# Patient Record
Sex: Female | Born: 1965 | Race: White | Hispanic: No | Smoking: Former smoker
Health system: Southern US, Community
[De-identification: ages and names within clinical notes are randomized; demographics above are authoritative.]

## PROBLEM LIST (undated history)

## (undated) DIAGNOSIS — F419 Anxiety disorder, unspecified: Secondary | ICD-10-CM

## (undated) DIAGNOSIS — K7460 Unspecified cirrhosis of liver: Secondary | ICD-10-CM

## (undated) DIAGNOSIS — F1010 Alcohol abuse, uncomplicated: Secondary | ICD-10-CM

---

## 2018-05-20 ENCOUNTER — Emergency Department
Admission: EM | Admit: 2018-05-20 | Discharge: 2018-05-21 | Disposition: A | Discharge status: Home / Self Care | Payer: BLUE CROSS/BLUE SHIELD | Attending: Emergency Medicine | Admitting: Emergency Medicine

## 2018-05-20 DIAGNOSIS — R51 Headache: Secondary | ICD-10-CM | POA: Diagnosis present

## 2018-05-20 DIAGNOSIS — Z87891 Personal history of nicotine dependence: Secondary | ICD-10-CM | POA: Diagnosis not present

## 2018-05-20 DIAGNOSIS — R519 Headache, unspecified: Secondary | ICD-10-CM

## 2018-05-20 HISTORY — DX: Unspecified cirrhosis of liver: K74.60

## 2018-05-20 LAB — COOXEMETRY PANEL
Carboxyhemoglobin: 1.6 % — ABNORMAL HIGH (ref 0.5–1.5)
Methemoglobin: 0.9 % (ref 0.0–1.5)
O2 Saturation: 93.2 %
Total hemoglobin: 10.8 g/dL — ABNORMAL LOW (ref 12.0–16.0)

## 2018-05-20 NOTE — ED Triage Notes (Signed)
Patient coming [facility] from home. Patient c/o headache N/V X 5 days. Patient just moved into new home 5 days ago.   EMS vitals: 170/90, 96% RA, HR 102.

## 2018-05-21 NOTE — ED Notes (Signed)
Patient placed in subwait on a nonrebreather per MD Owens Shark.

## 2018-05-21 NOTE — ED Provider Notes (Signed)
[facility] Emergency Department Provider Note   ____________________________________________    I have reviewed the triage vital signs and the nursing notes.   HISTORY  Chief Complaint Headache     HPI Lisa Dennis is a 53 y.o. female who presents with complaints of headaches.  Patient reports she recently moved into a "historic district home" 5 days ago and since then she has had intermittent global throbbing headaches.  She suspects this is related to her new home.  Denies light sensitivity.  Does have a history of headaches.  No neck pain.  No neuro deficits.  Has not taken anything for this, does not believe in taking medications.  Currently feels well and has no complaints  Past Medical History:  Diagnosis Date  . Cirrhosis ([place])     There are no active problems to display for this patient.   History reviewed. No pertinent surgical history.  Prior to Admission medications   Not on File     Allergies Benadryl [diphenhydramine]; Morphine and related; Penicillins; Sulfa antibiotics; and Tetracyclines & related  No family history on file.  Social History Social History   Tobacco Use  . Smoking status: Former Research scientist (life sciences)  . Smokeless tobacco: Never Used  Substance Use Topics  . Alcohol use: Yes  . Drug use: Not on file    Review of Systems  Constitutional: No fever/chills Eyes: No visual changes.  ENT: No neck pain Cardiovascular: Denies chest pain. Respiratory: Denies shortness of breath. Gastrointestinal: No abdominal pain.  No nausea, no vomiting.   Genitourinary: Negative for dysuria. Musculoskeletal: Negative for back pain. Skin: Negative for rash. Neurological: As above   ____________________________________________   PHYSICAL EXAM:  VITAL SIGNS: ED Triage Vitals  Enc Vitals Group     BP 05/20/18 2247 (!) 137/91     Pulse Rate 05/20/18 2247 (!) 104     Resp 05/20/18 2247 18     Temp 05/20/18 2247 98.1  F (36.7 C)     Temp Source 05/20/18 2247 Oral     SpO2 05/20/18 2247 96 %     Weight 05/20/18 2240 53.5 kg (118 lb)     Height 05/20/18 2240 1.549 m (5\' 1" )     Head Circumference --      Peak Flow --      Pain Score 05/20/18 2240 7     Pain Loc --      Pain Edu? --      Excl. in [place]? --     Constitutional: Alert and oriented.  Eyes: Conjunctivae are normal.  PERRLA, EOMI Head: Atraumatic. Nose: No congestion/rhinnorhea. Mouth/Throat: Mucous membranes are moist.   Neck:  Painless ROM Cardiovascular: Normal rate, regular rhythm. Good peripheral circulation. Respiratory: Normal respiratory effort.  No retractions. Lungs CTAB.  Musculoskeletal: Warm and well perfused Neurologic:  Normal speech and language. No gross focal neurologic deficits are appreciated.  Skin:  Skin is warm, dry and intact. No rash noted. Psychiatric: Mood and affect are normal. Speech and behavior are normal.  ____________________________________________   LABS (all labs ordered are listed, but only abnormal results are displayed)  Labs Reviewed  COOXEMETRY PANEL - Abnormal; Notable for the following components:      Result Value   Total hemoglobin 10.8 (*)    Carboxyhemoglobin 1.6 (*)    All other components within normal limits   ____________________________________________  EKG  None ____________________________________________  RADIOLOGY  None ____________________________________________   PROCEDURES  Procedure(s) performed: No  Procedures   Critical Care performed: No ____________________________________________   INITIAL IMPRESSION / ASSESSMENT AND PLAN / ED COURSE  Pertinent labs & imaging results that were available during my care of the patient were reviewed by me and considered in my medical decision making (see chart for details).  Patient well-appearing in no acute distress, neuro exam is normal.  Unclear whether environmental exposure may be causing her headaches  although currently she is completely asymptomatic and feels well.  Recommended trialing ibuprofen or naproxen but she does not take medications.  Will refer her to internal medicine as she is new to the area.    ____________________________________________   FINAL CLINICAL IMPRESSION(S) / ED DIAGNOSES  Final diagnoses:  Acute nonintractable headache, unspecified headache type        Note:  This document was prepared using Dragon voice recognition software and may include unintentional dictation errors.   Lavonia Drafts, MD 05/21/18 [phone]

## 2018-06-08 ENCOUNTER — Other Ambulatory Visit: Payer: Self-pay

## 2018-06-08 ENCOUNTER — Inpatient Hospital Stay: Payer: BLUE CROSS/BLUE SHIELD | Admitting: Certified Registered"

## 2018-06-08 ENCOUNTER — Inpatient Hospital Stay
Admission: EM | Admit: 2018-06-08 | Discharge: 2018-06-09 | DRG: 378 | Disposition: A | Discharge status: Home / Self Care | Payer: BLUE CROSS/BLUE SHIELD | Attending: Internal Medicine | Admitting: Internal Medicine

## 2018-06-08 ENCOUNTER — Encounter: Payer: Self-pay | Admitting: Emergency Medicine

## 2018-06-08 ENCOUNTER — Emergency Department: Payer: BLUE CROSS/BLUE SHIELD

## 2018-06-08 ENCOUNTER — Encounter
Admission: EM | Disposition: A | Discharge status: Home / Self Care | Payer: Self-pay | Source: Home / Self Care | Attending: Internal Medicine

## 2018-06-08 DIAGNOSIS — Z7141 Alcohol abuse counseling and surveillance of alcoholic: Secondary | ICD-10-CM | POA: Diagnosis not present

## 2018-06-08 DIAGNOSIS — Z880 Allergy status to penicillin: Secondary | ICD-10-CM | POA: Diagnosis not present

## 2018-06-08 DIAGNOSIS — D62 Acute posthemorrhagic anemia: Secondary | ICD-10-CM | POA: Diagnosis present

## 2018-06-08 DIAGNOSIS — Z885 Allergy status to narcotic agent status: Secondary | ICD-10-CM

## 2018-06-08 DIAGNOSIS — Z87891 Personal history of nicotine dependence: Secondary | ICD-10-CM | POA: Diagnosis not present

## 2018-06-08 DIAGNOSIS — Y906 Blood alcohol level of 120-199 mg/100 ml: Secondary | ICD-10-CM | POA: Diagnosis present

## 2018-06-08 DIAGNOSIS — K922 Gastrointestinal hemorrhage, unspecified: Secondary | ICD-10-CM | POA: Diagnosis present

## 2018-06-08 DIAGNOSIS — F10239 Alcohol dependence with withdrawal, unspecified: Secondary | ICD-10-CM | POA: Diagnosis present

## 2018-06-08 DIAGNOSIS — I341 Nonrheumatic mitral (valve) prolapse: Secondary | ICD-10-CM | POA: Diagnosis present

## 2018-06-08 DIAGNOSIS — Z882 Allergy status to sulfonamides status: Secondary | ICD-10-CM

## 2018-06-08 DIAGNOSIS — I8500 Esophageal varices without bleeding: Secondary | ICD-10-CM | POA: Diagnosis present

## 2018-06-08 DIAGNOSIS — K449 Diaphragmatic hernia without obstruction or gangrene: Secondary | ICD-10-CM | POA: Diagnosis present

## 2018-06-08 DIAGNOSIS — D689 Coagulation defect, unspecified: Secondary | ICD-10-CM

## 2018-06-08 DIAGNOSIS — K3189 Other diseases of stomach and duodenum: Secondary | ICD-10-CM | POA: Diagnosis present

## 2018-06-08 DIAGNOSIS — D649 Anemia, unspecified: Secondary | ICD-10-CM

## 2018-06-08 DIAGNOSIS — F419 Anxiety disorder, unspecified: Secondary | ICD-10-CM | POA: Diagnosis present

## 2018-06-08 DIAGNOSIS — K7030 Alcoholic cirrhosis of liver without ascites: Secondary | ICD-10-CM

## 2018-06-08 DIAGNOSIS — K920 Hematemesis: Secondary | ICD-10-CM | POA: Diagnosis present

## 2018-06-08 DIAGNOSIS — K7460 Unspecified cirrhosis of liver: Secondary | ICD-10-CM

## 2018-06-08 DIAGNOSIS — Z881 Allergy status to other antibiotic agents status: Secondary | ICD-10-CM | POA: Diagnosis not present

## 2018-06-08 DIAGNOSIS — R791 Abnormal coagulation profile: Secondary | ICD-10-CM | POA: Diagnosis present

## 2018-06-08 DIAGNOSIS — E876 Hypokalemia: Secondary | ICD-10-CM | POA: Diagnosis present

## 2018-06-08 DIAGNOSIS — K766 Portal hypertension: Secondary | ICD-10-CM | POA: Diagnosis present

## 2018-06-08 DIAGNOSIS — R197 Diarrhea, unspecified: Secondary | ICD-10-CM

## 2018-06-08 DIAGNOSIS — F10939 Alcohol use, unspecified with withdrawal, unspecified: Secondary | ICD-10-CM

## 2018-06-08 DIAGNOSIS — D696 Thrombocytopenia, unspecified: Secondary | ICD-10-CM | POA: Diagnosis present

## 2018-06-08 HISTORY — DX: Anxiety disorder, unspecified: F41.9

## 2018-06-08 HISTORY — PX: ESOPHAGOGASTRODUODENOSCOPY (EGD) WITH PROPOFOL: SHX5813

## 2018-06-08 HISTORY — DX: Alcohol abuse, uncomplicated: F10.10

## 2018-06-08 LAB — DIFFERENTIAL
Abs Immature Granulocytes: 0.03 10*3/uL (ref 0.00–0.07)
Basophils Absolute: 0.1 10*3/uL (ref 0.0–0.1)
Basophils Relative: 2 %
Eosinophils Absolute: 0 10*3/uL (ref 0.0–0.5)
Eosinophils Relative: 1 %
Immature Granulocytes: 1 %
Lymphocytes Relative: 20 %
Lymphs Abs: 0.9 10*3/uL (ref 0.7–4.0)
Monocytes Absolute: 0.6 10*3/uL (ref 0.1–1.0)
Monocytes Relative: 15 %
NEUTROS ABS: 2.7 10*3/uL (ref 1.7–7.7)
Neutrophils Relative %: 61 %
RBC Morphology: NONE SEEN
Smear Review: NORMAL

## 2018-06-08 LAB — URINALYSIS, COMPLETE (UACMP) WITH MICROSCOPIC
BACTERIA UA: NONE SEEN
Bilirubin Urine: NEGATIVE
Glucose, UA: NEGATIVE mg/dL
Hgb urine dipstick: NEGATIVE
Ketones, ur: NEGATIVE mg/dL
Leukocytes,Ua: NEGATIVE
Nitrite: NEGATIVE
Protein, ur: 30 mg/dL — AB
Specific Gravity, Urine: 1.027 (ref 1.005–1.030)
pH: 6 (ref 5.0–8.0)

## 2018-06-08 LAB — COMPREHENSIVE METABOLIC PANEL
ALT: 33 U/L (ref 0–44)
ALT: 31 U/L (ref 0–44)
AST: 130 U/L — ABNORMAL HIGH (ref 15–41)
AST: 84 U/L — ABNORMAL HIGH (ref 15–41)
Albumin: 3.4 g/dL — ABNORMAL LOW (ref 3.5–5.0)
Albumin: 2.8 g/dL — ABNORMAL LOW (ref 3.5–5.0)
Alkaline Phosphatase: 76 U/L (ref 38–126)
Alkaline Phosphatase: 64 U/L (ref 38–126)
Anion gap: 12 (ref 5–15)
Anion gap: 11 (ref 5–15)
BUN: 16 mg/dL (ref 6–20)
BUN: 15 mg/dL (ref 6–20)
CHLORIDE: 106 mmol/L (ref 98–111)
CO2: 22 mmol/L (ref 22–32)
CO2: 21 mmol/L — AB (ref 22–32)
Calcium: 8.6 mg/dL — ABNORMAL LOW (ref 8.9–10.3)
Calcium: 7.9 mg/dL — ABNORMAL LOW (ref 8.9–10.3)
Chloride: 104 mmol/L (ref 98–111)
Creatinine, Ser: 0.35 mg/dL — ABNORMAL LOW (ref 0.44–1.00)
Creatinine, Ser: 0.39 mg/dL — ABNORMAL LOW (ref 0.44–1.00)
GFR calc Af Amer: >60 mL/min (ref >60)
GFR calc Af Amer: >60 mL/min (ref >60)
GFR calc non Af Amer: >60 mL/min (ref >60)
Glucose, Bld: 129 mg/dL — ABNORMAL HIGH (ref 70–99)
Glucose, Bld: 95 mg/dL (ref 70–99)
POTASSIUM: 3.8 mmol/L (ref 3.5–5.1)
Potassium: 4.5 mmol/L (ref 3.5–5.1)
Sodium: 138 mmol/L (ref 135–145)
Sodium: 138 mmol/L (ref 135–145)
Total Bilirubin: 4.9 mg/dL — ABNORMAL HIGH (ref 0.3–1.2)
Total Bilirubin: 4.2 mg/dL — ABNORMAL HIGH (ref 0.3–1.2)
Total Protein: 8 g/dL (ref 6.5–8.1)
Total Protein: 6.8 g/dL (ref 6.5–8.1)

## 2018-06-08 LAB — ABO/RH: ABO/RH(D): A POS

## 2018-06-08 LAB — PREPARE RBC (CROSSMATCH)

## 2018-06-08 LAB — PROTIME-INR
INR: 1.5 — AB (ref 0.8–1.2)
PROTHROMBIN TIME: 17.9 s — AB (ref 11.4–15.2)

## 2018-06-08 LAB — CBC
HCT: 28.2 % — ABNORMAL LOW (ref 36.0–46.0)
HCT: 28.6 % — ABNORMAL LOW (ref 36.0–46.0)
HEMOGLOBIN: 9 g/dL — AB (ref 12.0–15.0)
Hemoglobin: 8.9 g/dL — ABNORMAL LOW (ref 12.0–15.0)
MCH: 28.5 pg (ref 26.0–34.0)
MCH: 29 pg (ref 26.0–34.0)
MCHC: 31.1 g/dL (ref 30.0–36.0)
MCHC: 31.9 g/dL (ref 30.0–36.0)
MCV: 91 fL (ref 80.0–100.0)
MCV: 91.7 fL (ref 80.0–100.0)
NRBC: 0 % (ref 0.0–0.2)
NRBC: 0 % (ref 0.0–0.2)
Platelets: 54 10*3/uL — ABNORMAL LOW (ref 150–400)
Platelets: 55 10*3/uL — ABNORMAL LOW (ref 150–400)
RBC: 3.1 MIL/uL — ABNORMAL LOW (ref 3.87–5.11)
RBC: 3.12 MIL/uL — ABNORMAL LOW (ref 3.87–5.11)
RDW: 21.2 % — ABNORMAL HIGH (ref 11.5–15.5)
RDW: 21.5 % — ABNORMAL HIGH (ref 11.5–15.5)
WBC: 4.5 10*3/uL (ref 4.0–10.5)
WBC: 4.4 10*3/uL (ref 4.0–10.5)

## 2018-06-08 LAB — HEMATOCRIT
HCT: 26.3 % — ABNORMAL LOW (ref 36.0–46.0)
HEMATOCRIT: 23.4 % — AB (ref 36.0–46.0)

## 2018-06-08 LAB — HEMOGLOBIN
Hemoglobin: 7.2 g/dL — ABNORMAL LOW (ref 12.0–15.0)
Hemoglobin: 8.3 g/dL — ABNORMAL LOW (ref 12.0–15.0)
Hemoglobin: 7.9 g/dL — ABNORMAL LOW (ref 12.0–15.0)

## 2018-06-08 LAB — LIPASE, BLOOD: Lipase: 46 U/L (ref 11–51)

## 2018-06-08 LAB — ETHANOL: Alcohol, Ethyl (B): 144 mg/dL — ABNORMAL HIGH (ref <10)

## 2018-06-08 LAB — TECHNOLOGIST SMEAR REVIEW: Plt Morphology: NONE SEEN

## 2018-06-08 LAB — APTT: aPTT: 41 seconds — ABNORMAL HIGH (ref 24–36)

## 2018-06-08 SURGERY — ESOPHAGOGASTRODUODENOSCOPY (EGD) WITH PROPOFOL
Anesthesia: General

## 2018-06-08 MED ORDER — SODIUM CHLORIDE 0.9 % IV SOLN
8.0000 mg/h | INTRAVENOUS | Status: DC
  Administered 2018-06-08 – 2018-06-09 (×3): 8 mg/h via INTRAVENOUS
  Filled 2018-06-08 (×3): qty 80

## 2018-06-08 MED ORDER — HYDROXYZINE HCL 25 MG PO TABS
25.0000 mg | ORAL_TABLET | Freq: Four times a day (QID) | ORAL | Status: DC | PRN
  Filled 2018-06-08: qty 1

## 2018-06-08 MED ORDER — SODIUM CHLORIDE 0.9% FLUSH
3.0000 mL | Freq: Once | INTRAVENOUS | Status: AC
  Administered 2018-06-08: 3 mL via INTRAVENOUS

## 2018-06-08 MED ORDER — LORAZEPAM 1 MG PO TABS: 1.0000 mg | ORAL_TABLET | Freq: Two times a day (BID) | ORAL | Status: DC

## 2018-06-08 MED ORDER — ONDANSETRON HCL 4 MG PO TABS: 4.0000 mg | ORAL_TABLET | Freq: Four times a day (QID) | ORAL | Status: DC | PRN

## 2018-06-08 MED ORDER — SODIUM CHLORIDE 0.9 % IV SOLN
50.0000 ug/h | INTRAVENOUS | Status: DC
  Administered 2018-06-08 – 2018-06-09 (×3): 50 ug/h via INTRAVENOUS
  Filled 2018-06-08 (×5): qty 1

## 2018-06-08 MED ORDER — PROPOFOL 10 MG/ML IV BOLUS
INTRAVENOUS | Status: DC | PRN
  Administered 2018-06-08: 20 mg via INTRAVENOUS
  Administered 2018-06-08: 50 mg via INTRAVENOUS
  Administered 2018-06-08 (×2): 20 mg via INTRAVENOUS

## 2018-06-08 MED ORDER — ADULT MULTIVITAMIN W/MINERALS CH
1.0000 | ORAL_TABLET | Freq: Every day | ORAL | Status: DC
  Filled 2018-06-08: qty 1

## 2018-06-08 MED ORDER — ONDANSETRON HCL 4 MG/2ML IJ SOLN: 4.0000 mg | Freq: Four times a day (QID) | INTRAMUSCULAR | Status: DC | PRN

## 2018-06-08 MED ORDER — PANTOPRAZOLE SODIUM 40 MG IV SOLR
40.0000 mg | Freq: Once | INTRAVENOUS | Status: AC
  Administered 2018-06-08: 40 mg via INTRAVENOUS
  Filled 2018-06-08: qty 40

## 2018-06-08 MED ORDER — SODIUM CHLORIDE 0.9% IV SOLUTION
Freq: Once | INTRAVENOUS | Status: AC
  Administered 2018-06-08: 12:00:00 via INTRAVENOUS

## 2018-06-08 MED ORDER — SODIUM CHLORIDE 0.9 % IV SOLN: 50.0000 ug/h | INTRAVENOUS | Status: DC

## 2018-06-08 MED ORDER — SODIUM CHLORIDE 0.9 % IV BOLUS
1000.0000 mL | Freq: Once | INTRAVENOUS | Status: AC
  Administered 2018-06-08: 1000 mL via INTRAVENOUS

## 2018-06-08 MED ORDER — SODIUM CHLORIDE 0.9 % IV SOLN
INTRAVENOUS | Status: DC | PRN
  Administered 2018-06-08: 14:00:00 via INTRAVENOUS

## 2018-06-08 MED ORDER — ACETAMINOPHEN 325 MG PO TABS
650.0000 mg | ORAL_TABLET | Freq: Four times a day (QID) | ORAL | Status: DC | PRN
  Administered 2018-06-08: 650 mg via ORAL
  Filled 2018-06-08: qty 2

## 2018-06-08 MED ORDER — SODIUM CHLORIDE 0.9 % IV SOLN
2.0000 g | INTRAVENOUS | Status: DC
  Administered 2018-06-09: 05:00:00 2 g via INTRAVENOUS
  Filled 2018-06-08: qty 2

## 2018-06-08 MED ORDER — THIAMINE HCL 100 MG/ML IJ SOLN
100.0000 mg | Freq: Once | INTRAMUSCULAR | Status: AC
  Administered 2018-06-08: 100 mg via INTRAVENOUS
  Filled 2018-06-08: qty 2

## 2018-06-08 MED ORDER — THIAMINE HCL 100 MG/ML IJ SOLN
INTRAVENOUS | Status: DC
  Administered 2018-06-08 – 2018-06-09 (×3): via INTRAVENOUS
  Filled 2018-06-08 (×5): qty 1000

## 2018-06-08 MED ORDER — TRAZODONE HCL 50 MG PO TABS: 25.0000 mg | ORAL_TABLET | Freq: Every evening | ORAL | Status: DC | PRN

## 2018-06-08 MED ORDER — FOLIC ACID 1 MG PO TABS
1.0000 mg | ORAL_TABLET | Freq: Once | ORAL | Status: AC
  Administered 2018-06-08: 1 mg via ORAL
  Filled 2018-06-08: qty 1

## 2018-06-08 MED ORDER — SODIUM CHLORIDE 0.9 % IV SOLN: 8.0000 mg/h | INTRAVENOUS | Status: DC

## 2018-06-08 MED ORDER — LORAZEPAM 1 MG PO TABS: 1.0000 mg | ORAL_TABLET | Freq: Four times a day (QID) | ORAL | Status: DC | PRN

## 2018-06-08 MED ORDER — LORAZEPAM 2 MG/ML IJ SOLN
1.0000 mg | Freq: Once | INTRAMUSCULAR | Status: AC
  Administered 2018-06-08: 1 mg via INTRAVENOUS
  Filled 2018-06-08: qty 1

## 2018-06-08 MED ORDER — ACETAMINOPHEN 650 MG RE SUPP: 650.0000 mg | Freq: Four times a day (QID) | RECTAL | Status: DC | PRN

## 2018-06-08 MED ORDER — LORAZEPAM 1 MG PO TABS
1.0000 mg | ORAL_TABLET | Freq: Four times a day (QID) | ORAL | Status: AC
  Administered 2018-06-08 (×3): 1 mg via ORAL
  Filled 2018-06-08 (×3): qty 1

## 2018-06-08 MED ORDER — SODIUM CHLORIDE 0.9 % IV SOLN
2.0000 g | INTRAVENOUS | Status: AC
  Administered 2018-06-08: 2 g via INTRAVENOUS
  Filled 2018-06-08: qty 20

## 2018-06-08 MED ORDER — OCTREOTIDE LOAD VIA INFUSION
50.0000 ug | Freq: Once | INTRAVENOUS | Status: AC
  Administered 2018-06-08: 50 ug via INTRAVENOUS
  Filled 2018-06-08: qty 25

## 2018-06-08 MED ORDER — ONDANSETRON HCL 4 MG/2ML IJ SOLN
4.0000 mg | Freq: Once | INTRAMUSCULAR | Status: AC
  Administered 2018-06-08: 4 mg via INTRAVENOUS
  Filled 2018-06-08: qty 2

## 2018-06-08 MED ORDER — LORAZEPAM 1 MG PO TABS: 1.0000 mg | ORAL_TABLET | Freq: Every day | ORAL | Status: DC

## 2018-06-08 MED ORDER — LORAZEPAM 1 MG PO TABS: 1.0000 mg | ORAL_TABLET | Freq: Three times a day (TID) | ORAL | Status: DC

## 2018-06-08 MED ORDER — TRAMADOL HCL 50 MG PO TABS: 50.0000 mg | ORAL_TABLET | Freq: Four times a day (QID) | ORAL | Status: DC | PRN

## 2018-06-08 NOTE — ED Notes (Signed)
ED TO INPATIENT HANDOFF REPORT  ED Nurse Name and Phone #:  Lachandra Dettmann [phone]  S Name/Age/Gender Lisa Dennis 53 y.o. female Room/Bed: ED02A/ED02A  Code Status   Code Status: Full Code  Home/SNF/Other Home Patient oriented to: self Is this baseline? Yes   Triage Complete: Triage complete  Chief Complaint [facility] - Nausea/emesis/diarrhea  Triage Note Pt arrives via ems from home, pt states that she hasn't felt well for the past 3 days, states that today she started vomiting with nausea and it has cont, coffee grounds noted to the emesis, pt is shaky, states that she is just cold, states that she has a diagnosis of cirrhosis, states that her last drink was yesterday at lunch, pt has yellow skin tone noted   Allergies Allergies  Allergen Reactions  . Benadryl [Diphenhydramine] Itching  . Morphine And Related Itching  . Penicillins   . Sulfa Antibiotics Itching  . Tetracyclines & Related Itching    Level of Care/Admitting Diagnosis ED Disposition    ED Disposition Condition [facility] Area: [place] [[id number]]  Level of Care: Med-Surg [16]  Diagnosis: GI bleeding [809983]  Admitting Physician: Christel Mormon [[id number]]  Attending Physician: Christel Mormon [[id number]]  Estimated length of stay: past midnight tomorrow  Certification:: I certify this patient will need inpatient services for at least 2 midnights  PT Class (Do Not Modify): Inpatient [101]  PT Acc Code (Do Not Modify): Private [1]       B Medical/Surgery History Past Medical History:  Diagnosis Date  . Alcohol abuse   . Anxiety   . Cirrhosis ([place])    History reviewed. No pertinent surgical history.   A IV Location/Drains/Wounds Patient Lines/Drains/Airways Status   Active Line/Drains/Airways    Name:   Placement date:   Placement time:   Site:   Days:   Peripheral IV 06/08/18 Left Hand   06/08/18    -    Hand   less than 1   Peripheral IV 06/08/18 Right Arm    06/08/18    0331    Arm   less than 1          Intake/Output Last 24 hours  Intake/Output Summary (Last 24 hours) at 06/08/2018 0430 Last data filed at 06/08/2018 0215 Gross per 24 hour  Intake -  Output 100 ml  Net -100 ml    Labs/Imaging Results for orders placed or performed during the hospital encounter of 06/08/18 (from the past 48 hour(s))  Lipase, blood     Status: None   Collection Time: 06/08/18  1:08 AM  Result Value Ref Range   Lipase 46 11 - 51 U/L    Comment: Performed at [facility], [address] [place], [place] [address]  Comprehensive metabolic panel     Status: Abnormal   Collection Time: 06/08/18  1:08 AM  Result Value Ref Range   Sodium 138 135 - 145 mmol/L   Potassium 4.5 3.5 - 5.1 mmol/L    Comment: HEMOLYSIS AT THIS LEVEL MAY AFFECT RESULT   Chloride 104 98 - 111 mmol/L   CO2 22 22 - 32 mmol/L   Glucose, Bld 129 (H) 70 - 99 mg/dL   BUN 16 6 - 20 mg/dL   Creatinine, Ser 0.35 (L) 0.44 - 1.00 mg/dL   Calcium 8.6 (L) 8.9 - 10.3 mg/dL   Total Protein 8.0 6.5 - 8.1 g/dL   Albumin 3.4 (L) 3.5 - 5.0  g/dL   AST 130 (H) 15 - 41 U/L    Comment: HEMOLYSIS AT THIS LEVEL MAY AFFECT RESULT   ALT 33 0 - 44 U/L   Alkaline Phosphatase 76 38 - 126 U/L   Total Bilirubin 4.9 (H) 0.3 - 1.2 mg/dL    Comment: HEMOLYSIS AT THIS LEVEL MAY AFFECT RESULT   GFR calc non Af Amer >60 >60 mL/min   GFR calc Af Amer >60 >60 mL/min   Anion gap 12 5 - 15    Comment: Performed at [facility], [address] [place], [place] [address]  CBC     Status: Abnormal   Collection Time: 06/08/18  1:08 AM  Result Value Ref Range   WBC 4.5 4.0 - 10.5 K/uL   RBC 3.12 (L) 3.87 - 5.11 MIL/uL   Hemoglobin 8.9 (L) 12.0 - 15.0 g/dL   HCT 28.6 (L) 36.0 - 46.0 %   MCV 91.7 80.0 - 100.0 fL   MCH 28.5 26.0 - 34.0 pg   MCHC 31.1 30.0 - 36.0 g/dL   RDW 21.5 (H) 11.5 - 15.5 %   Platelets 54 (L) 150 - 400 K/uL    Comment: PLATELET COUNT CONFIRMED BY SMEAR Immature  Platelet Fraction may be clinically indicated, consider ordering this additional test [id number]    nRBC 0.0 0.0 - 0.2 %    Comment: Performed at [facility], [address] [place], [place] [address]  Technologist smear review     Status: None   Collection Time: 06/08/18  1:08 AM  Result Value Ref Range   Tech Review NO SCHISTOCYTES SEEN     Comment: TARGET CELLS Performed at [facility], [address] [place], [place] [address]   Differential     Status: None   Collection Time: 06/08/18  1:08 AM  Result Value Ref Range   Neutrophils Relative % 61 %   Neutro Abs 2.7 1.7 - 7.7 K/uL   Lymphocytes Relative 20 %   Lymphs Abs 0.9 0.7 - 4.0 K/uL   Monocytes Relative 15 %   Monocytes Absolute 0.6 0.1 - 1.0 K/uL   Eosinophils Relative 1 %   Eosinophils Absolute 0.0 0.0 - 0.5 K/uL   Basophils Relative 2 %   Basophils Absolute 0.1 0.0 - 0.1 K/uL   WBC Morphology MORPHOLOGY UNREMARKABLE    RBC Morphology NO SCHISTOCYTES SEEN    Smear Review Normal platelet morphology    Immature Granulocytes 1 %   Abs Immature Granulocytes 0.03 0.00 - 0.07 K/uL   Target Cells PRESENT     Comment: Performed at [facility], [address] [place], [place] [address]  CBC     Status: Abnormal   Collection Time: 06/08/18  1:08 AM  Result Value Ref Range   WBC 4.4 4.0 - 10.5 K/uL   RBC 3.10 (L) 3.87 - 5.11 MIL/uL   Hemoglobin 9.0 (L) 12.0 - 15.0 g/dL   HCT 28.2 (L) 36.0 - 46.0 %   MCV 91.0 80.0 - 100.0 fL   MCH 29.0 26.0 - 34.0 pg   MCHC 31.9 30.0 - 36.0 g/dL   RDW 21.2 (H) 11.5 - 15.5 %   Platelets 55 (L) 150 - 400 K/uL    Comment: Immature Platelet Fraction may be clinically indicated, consider ordering this additional test [id number]    nRBC 0.0 0.0 - 0.2 %    Comment: Performed at [facility], [address], [place], [place] [address]  Protime-INR  Status: Abnormal   Collection Time: 06/08/18  1:09 AM  Result Value Ref Range   Prothrombin  Time 17.9 (H) 11.4 - 15.2 seconds   INR 1.5 (H) 0.8 - 1.2    Comment: (NOTE) INR goal varies based on device and disease states. Performed at [facility], [address] [place], [place] [address]   APTT     Status: Abnormal   Collection Time: 06/08/18  1:09 AM  Result Value Ref Range   aPTT 41 (H) 24 - 36 seconds    Comment:        IF BASELINE aPTT IS ELEVATED, SUGGEST PATIENT RISK ASSESSMENT BE USED TO DETERMINE APPROPRIATE ANTICOAGULANT THERAPY. Performed at [facility], [address] [place], [place] [address]   Ethanol     Status: Abnormal   Collection Time: 06/08/18  2:11 AM  Result Value Ref Range   Alcohol, Ethyl (B) 144 (H) <10 mg/dL    Comment: (NOTE) Lowest detectable limit for serum alcohol is 10 mg/dL. For medical purposes only. Performed at [facility], [address] [place], [place] [address]   Urinalysis, Complete w Microscopic     Status: Abnormal   Collection Time: 06/08/18  2:15 AM  Result Value Ref Range   Color, Urine AMBER (A) YELLOW    Comment: BIOCHEMICALS MAY BE AFFECTED BY COLOR   APPearance CLEAR (A) CLEAR   Specific Gravity, Urine 1.027 1.005 - 1.030   pH 6.0 5.0 - 8.0   Glucose, UA NEGATIVE NEGATIVE mg/dL   Hgb urine dipstick NEGATIVE NEGATIVE   Bilirubin Urine NEGATIVE NEGATIVE   Ketones, ur NEGATIVE NEGATIVE mg/dL   Protein, ur 30 (A) NEGATIVE mg/dL   Nitrite NEGATIVE NEGATIVE   Leukocytes,Ua NEGATIVE NEGATIVE   RBC / HPF 0-5 0 - 5 RBC/hpf   WBC, UA 0-5 0 - 5 WBC/hpf   Bacteria, UA NONE SEEN NONE SEEN   Squamous Epithelial / LPF 0-5 0 - 5   Mucus PRESENT     Comment: Performed at [facility], [address] [place], [place] [address]  Type and screen Ordered by PROVIDER DEFAULT     Status: None (Preliminary result)   Collection Time: 06/08/18  3:06 AM  Result Value Ref Range   ABO/RH(D) PENDING    Antibody Screen PENDING    Sample Expiration      06/11/2018 Performed at [facility], [address] [place],  [address]    No results found.  Pending Labs Unresulted Labs (From admission, onward)    Start     Ordered   06/08/18 0500  Comprehensive metabolic panel  Tomorrow morning,   STAT     06/08/18 0424   06/08/18 0419  Hemoglobin  Now then every 6 hours,   STAT     06/08/18 0424   06/08/18 0419  Hematocrit  Now then every 6 hours,   STAT     06/08/18 0424   06/08/18 0300  Hepatitis panel, acute  Add-on,   AD     06/08/18 0259          Vitals/Pain Today's Vitals   06/08/18 0106  BP: (!) 145/76  Pulse: 100  Resp: 16  SpO2: 96%  Weight: 49.9 kg  Height: 5\' 2"  (1.575 m)  PainSc: 0-No pain    Isolation Precautions No active isolations  Medications Medications  octreotide (SANDOSTATIN) 2 mcg/mL load via infusion 50 mcg (50 mcg Intravenous Bolus from Bag 06/08/18 0336)  And  octreotide (SANDOSTATIN) 500 mcg in sodium chloride 0.9 % 250 mL (2 mcg/mL) infusion (50 mcg/hr Intravenous New Bag/Given 06/08/18 0342)  pantoprazole (PROTONIX) 80 mg in sodium chloride 0.9 % 250 mL (0.32 mg/mL) infusion (8 mg/hr Intravenous New Bag/Given 06/08/18 0357)  cefTRIAXone (ROCEPHIN) 2 g in sodium chloride 0.9 % 100 mL IVPB (has no administration in time range)  sodium chloride 0.9 % 1,000 mL with thiamine 993 mg, folic acid 1 mg, multivitamins adult 10 mL infusion (has no administration in time range)  acetaminophen (TYLENOL) tablet 650 mg (has no administration in time range)    Or  acetaminophen (TYLENOL) suppository 650 mg (has no administration in time range)  traMADol (ULTRAM) tablet 50 mg (has no administration in time range)  traZODone (DESYREL) tablet 25 mg (has no administration in time range)  ondansetron (ZOFRAN) tablet 4 mg (has no administration in time range)    Or  ondansetron (ZOFRAN) injection 4 mg (has no administration in time range)  octreotide (SANDOSTATIN) 500 mcg in sodium chloride 0.9 % 250 mL (2 mcg/mL) infusion (has no  administration in time range)  sodium chloride flush (NS) 0.9 % injection 3 mL (3 mLs Intravenous Given 06/08/18 0115)  thiamine (B-1) injection 100 mg (100 mg Intravenous Given 06/08/18 0140)  sodium chloride 0.9 % bolus 1,000 mL (1,000 mLs Intravenous New Bag/Given 10/08/94 7893)  folic acid (FOLVITE) tablet 1 mg (1 mg Oral Given 06/08/18 0229)  LORazepam (ATIVAN) injection 1 mg (1 mg Intravenous Given 06/08/18 0227)  pantoprazole (PROTONIX) injection 40 mg (40 mg Intravenous Given 06/08/18 0224)  ondansetron (ZOFRAN) injection 4 mg (4 mg Intravenous Given 06/08/18 0223)  pantoprazole (PROTONIX) injection 40 mg (40 mg Intravenous Given 06/08/18 0353)    Mobility walks Low fall risk   Focused Assessments Cardiac Assessment Handoff:    No results found for: CKTOTAL, CKMB, CKMBINDEX, TROPONINI No results found for: DDIMER Does the Patient currently have chest pain? No      R Recommendations: See Admitting Provider Note  Report given to:   Additional Notes:

## 2018-06-08 NOTE — Plan of Care (Signed)
Hgb 7.2 in am. S/p 1xunit of RBC's. Hgb up to 8.3.  S/P EGD. Now on regular diet. Pt did not eat yet. Pt verbalized that she will try to eat later.  Denies n/v/abdominal pain.  No bleeding noted during the shift.  No BM during the shift. Pt a&ox4.  Calm and cooperative. CIWA score 2 for tremors. Continue scheduled ativan PO.

## 2018-06-08 NOTE — ED Triage Notes (Signed)
Pt arrives via ems from home, pt states that she hasn't felt well for the past 3 days, states that today she started vomiting with nausea and it has cont, coffee grounds noted to the emesis, pt is shaky, states that she is just cold, states that she has a diagnosis of cirrhosis, states that her last drink was yesterday at lunch, pt has yellow skin tone noted

## 2018-06-08 NOTE — ED Notes (Signed)
Patient to ultrasound

## 2018-06-08 NOTE — ED Notes (Signed)
Red top recollected

## 2018-06-08 NOTE — ED Notes (Signed)
Patient report being "lethargic" for 3 days.  Today started with nausea, vomiting and diarrhea.  Reports she is new to the area and doesn't know anyone and was afraid she might fall and injury herself and nobody would know.  Patient reports history of cirrhosis dx'd approximately 2 years ago, was told to stop drinking alcohol but has not - states she drink a "bottle" of wine throughout the day.

## 2018-06-08 NOTE — H&P (Signed)
[facility] [place] NAME: Lisa Dennis    MR#:  [id number]  DATE OF BIRTH:  23-Jun-1965  DATE OF ADMISSION:  06/08/2018  PRIMARY CARE PHYSICIAN: Patient, No Pcp Per   REQUESTING/REFERRING PHYSICIAN: Hinda Kehr, MD CHIEF COMPLAINT:   Chief Complaint  Patient presents with   Nausea   Emesis   Diarrhea    HISTORY OF PRESENT ILLNESS:  Lisa Dennis  is a 53 y.o. female with a known history of history of ongoing alcohol abuse and alcoholic liver cirrhosis as well as mitral valve prolapse who presented to the emergency room with acute onset of intractable nausea and vomiting with associated bilious vomitus per her report over the last 24 hours with diarrhea.  She has been feeling generally weak over the last 3 days.  She had occasional blood with her vomitus and was witnessed in the emergency room to have coffee-ground emesis.  She denied any abdominal pain.  No chest pain or dyspnea or palpitations.  No other bleeding diathesis.  She admitted to headache without dizziness or blurred vision.  No paresthesias or focal muscle weakness.  She was fairly somnolent but arousable during my interview.  Upon presentation to the emergency room, blood pressure was 145/76 and vital signs otherwise were within normal.  Her labs were remarkable for an AST of 130, total bilirubin of 4.9, hemoglobin hematocrit of 8.9 and 28.6 and platelets of 54.  Her INR was 1.5 and PTT 41 with a PT of 17.9.  Abdominal ultrasound and Doppler is currently pending.  The patient was given 1 mg of p.o. folic acid as well as 323 mg of IV thiamine, 4 mg of IV Zofran, 1 L bolus of IV normal saline 80 mg IV Protonix bolus and I recommended a drip as well as a bolus of IV octreotide followed by drip. Dr. Marius Ditch was notified about the patient and is aware.  She recommended 2 g of IV Rocephin.  The patient will be admitted to a medically monitored bed for further evaluation  and management. PAST MEDICAL HISTORY:   Past Medical History:  Diagnosis Date   Alcohol abuse    Anxiety    Cirrhosis ([place])   .  Mitral valve prolapse .  History of lower GI bleeding  PAST SURGICAL HISTORY:  History reviewed. No pertinent surgical history.  She denied any previous surgeries.  SOCIAL HISTORY:   Social History   Tobacco Use   Smoking status: Former Smoker   Smokeless tobacco: Never Used  Substance Use Topics   Alcohol use: Yes    Comment: heavy    FAMILY HISTORY:  History reviewed. No pertinent family history.  DRUG ALLERGIES:   Allergies  Allergen Reactions   Benadryl [Diphenhydramine] Itching   Morphine And Related Itching   Penicillins    Sulfa Antibiotics Itching   Tetracyclines & Related Itching    REVIEW OF SYSTEMS:   ROS As per history of present illness. All pertinent systems were reviewed above. Constitutional,  HEENT, cardiovascular, respiratory, GI, GU, musculoskeletal, neuro, psychiatric, endocrine,  integumentary and hematologic systems were reviewed and are otherwise  negative/unremarkable except for positive findings mentioned above in the HPI.   MEDICATIONS AT HOME:   Prior to Admission medications   Not on File      VITAL SIGNS:  Blood pressure (!) 145/76, pulse 100, resp. rate 16, height 5\' 2"  (1.575 m), weight 49.9 kg, SpO2 96 %.  PHYSICAL EXAMINATION:  Physical Exam  GENERAL:  53 y.o.-year-old Caucasian female patient lying in the bed with no acute distress.  EYES: Pupils equal, round, reactive to light and accommodation. No scleral icterus. Extraocular muscles intact.  HEENT: Head atraumatic, normocephalic. Oropharynx and nasopharynx clear.  NECK:  Supple, no jugular venous distention. No thyroid enlargement, no tenderness.  LUNGS: Normal breath sounds bilaterally, no wheezing, rales,rhonchi or crepitation. No use of accessory muscles of respiration.  CARDIOVASCULAR: S1, S2 normal. No murmurs, rubs, or  gallops.  ABDOMEN: Soft, distended and nontender.  Bowel sounds present. No palpable organomegaly or mass.  EXTREMITIES: No pedal edema, cyanosis, or clubbing.  NEUROLOGIC: Cranial nerves II through XII are intact. Muscle strength 5/5 in all extremities. Sensation intact. Gait not checked.  PSYCHIATRIC: The patient is alert and oriented x 3.  SKIN: No obvious rash, lesion, or ulcer.   LABORATORY PANEL:   CBC Recent Labs  Lab 06/08/18 0108  WBC 4.4   4.5  HGB 9.0*   8.9*  HCT 28.2*   28.6*  PLT 55*   54*   ------------------------------------------------------------------------------------------------------------------  Chemistries  Recent Labs  Lab 06/08/18 0108  NA 138  K 4.5  CL 104  CO2 22  GLUCOSE 129*  BUN 16  CREATININE 0.35*  CALCIUM 8.6*  AST 130*  ALT 33  ALKPHOS 76  BILITOT 4.9*   ------------------------------------------------------------------------------------------------------------------  Cardiac Enzymes No results for input(s): TROPONINI in the last 168 hours. ------------------------------------------------------------------------------------------------------------------  RADIOLOGY:  No results found.  Abdominal ultrasound and Doppler is currently pending.    IMPRESSION AND PLAN:  #1.  Upper GI bleeding with hematemesis along with acute gastroenteritis.  The patient will be admitted to medically monitored bed.  She will be continued on IV Protonix drip as well as octreotide drip.  We will follow serial hemoglobin and hematocrits.  We typed and crossmatched.  A GI consultation will be obtained by Dr. Marius Ditch.  The patient will likely need a an upper GI endoscopy for further assessment.  An abdominal ultrasound with Doppler study was ordered and is currently pending.  We will obtain stool studies for further assessment as well.  Dr. Marius Ditch is aware about the patient.  She was contacted by the ER physician and I personally contacted her regarding the  consult.  2.  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  She is a high bleeding risk.  We will monitor her hemoglobin hematocrit as well as platelets.  She was counseled for cessation of alcohol.  She has elevated AST that will be followed back secondary to her alcoholism.  3.  Alcohol abuse.  I counseled her for cessation.  She will be placed on banana bag daily.  PRN IV Ativan will be provided for withdrawal.  4.  Mitral valve prolapse.  She has no manifestations of mitral valve regurgitation.  5.  DVT prophylaxis.  Medical prophylaxis is currently contraindicated due to GI bleeding.  She will be placed on SCDs.  6.  GI prophylaxis.  This is addressed above.     All the records are reviewed and case discussed with ED provider.  The plan of care was discussed in details with the patient . I answered all questions. The patient agreed to proceed with the above mentioned plan. Further management will depend upon hospital course.  This dictation has been created by [employer].  Any transcription errors that resulted from this process are unintentional.    CODE  STATUS: Full code  TOTAL TIME TAKING CARE OF THIS PATIENT: 55 minutes.    Christel Mormon M.D on 06/08/2018 at 4:28 AM  Pager - [phone]  After 6pm go to [email or web address] - Proofreader  [facility]  Office  [phone]  CC: Primary care physician; Patient, No Pcp Per

## 2018-06-08 NOTE — Transfer of Care (Signed)
Immediate Anesthesia Transfer of Care Note  Patient: Lisa Dennis  Procedure(s) Performed: ESOPHAGOGASTRODUODENOSCOPY (EGD) WITH PROPOFOL (N/A )  Patient Location: Endoscopy Unit  Anesthesia Type:General  Level of Consciousness: awake, alert , oriented and patient cooperative  Airway & Oxygen Therapy: Patient Spontanous Breathing and Patient connected to face mask oxygen  Post-op Assessment: Report given to RN and Post -op Vital signs reviewed and stable  Post vital signs: Reviewed and stable  Last Vitals:  Vitals Value Taken Time  BP 123/90 06/08/2018  2:37 PM  Temp 36.1 C 06/08/2018  2:38 PM  Pulse 96 06/08/2018  2:46 PM  Resp 17 06/08/2018  2:46 PM  SpO2 96 % 06/08/2018  2:46 PM  Vitals shown include unvalidated device data.  Last Pain:  Vitals:   06/08/18 1438  TempSrc: Tympanic  PainSc: 0-No pain         Complications: No apparent anesthesia complications

## 2018-06-08 NOTE — Anesthesia Postprocedure Evaluation (Signed)
Anesthesia Post Note  Patient: Lisa Dennis  Procedure(s) Performed: ESOPHAGOGASTRODUODENOSCOPY (EGD) WITH PROPOFOL (N/A )  Patient location during evaluation: Endoscopy Anesthesia Type: General Level of consciousness: awake and alert and oriented Pain management: pain level controlled Vital Signs Assessment: post-procedure vital signs reviewed and stable Respiratory status: spontaneous breathing, nonlabored ventilation and respiratory function stable Cardiovascular status: blood pressure returned to baseline and stable Postop Assessment: no signs of nausea or vomiting Anesthetic complications: no     Last Vitals:  Vitals:   06/08/18 1219 06/08/18 1438  BP: 116/72   Pulse: 95   Resp: 18   Temp: 37.5 C (!) 36.1 C  SpO2: 93%     Last Pain:  Vitals:   06/08/18 1448  TempSrc:   PainSc: 0-No pain                 Rance Smithson

## 2018-06-08 NOTE — Op Note (Signed)
[facility] Patient Name: Lisa Dennis Procedure Date: 06/08/2018 12:32 PM MRN: [id number] Account #: [id number] Date of Birth: 03/25/1966 Admit Type: Inpatient Age: 53 Room: [facility] ENDO ROOM 4 Gender: Female Note Status: Finalized Procedure:            Upper GI endoscopy Indications:          Melena Providers:            Lucilla Lame MD, MD Referring MD:         No Local Md, MD (Referring MD) Medicines:            Propofol per Anesthesia Complications:        No immediate complications. Procedure:            Pre-Anesthesia Assessment:                       - Prior to the procedure, a History and Physical was                        performed, and patient medications and allergies were                        reviewed. The patient's tolerance of previous                        anesthesia was also reviewed. The risks and benefits of                        the procedure and the sedation options and risks were                        discussed with the patient. All questions were                        answered, and informed consent was obtained. Prior                        Anticoagulants: The patient has taken no previous                        anticoagulant or antiplatelet agents. ASA Grade                        Assessment: III - A patient with severe systemic                        disease. After reviewing the risks and benefits, the                        patient was deemed in satisfactory condition to undergo                        the procedure.                       After obtaining informed consent, the endoscope was                        passed under direct vision. Throughout the procedure,  the patient's blood pressure, pulse, and oxygen                        saturations were monitored continuously. The Endoscope                        was introduced through the mouth, and advanced to the   second part of duodenum. The upper GI endoscopy was                        accomplished without difficulty. The patient tolerated                        the procedure well. Findings:      A medium-sized hiatal hernia was present.      Grade I varices were found in the lower third of the esophagus.      Mild portal hypertensive gastropathy was found in the entire examined       stomach.      Diffuse mildly erythematous mucosa without active bleeding and with no       stigmata of bleeding was found in the first portion of the duodenum. Impression:           - Medium-sized hiatal hernia.                       - Grade I esophageal varices.                       - Portal hypertensive gastropathy.                       - Erythematous duodenopathy.                       - No specimens collected. Recommendation:       - Return patient to hospital ward for ongoing care.                       - Resume regular diet.                       - Continue present medications. Procedure Code(s):    --- Professional ---                       [phone], Esophagogastroduodenoscopy, flexible, transoral;                        diagnostic, including collection of specimen(s) by                        brushing or washing, when performed (separate procedure) Diagnosis Code(s):    --- Professional ---                       K92.1, Melena (includes Hematochezia)                       K31.89, Other diseases of stomach and duodenum                       I85.00, Esophageal varices without bleeding CPT copyright 2018 [employer]. All rights reserved. The codes documented  in this report are preliminary and upon coder review may  be revised to meet current compliance requirements. Lucilla Lame MD, MD 06/08/2018 2:35:21 PM This report has been signed electronically. Number of Addenda: 0 Note Initiated On: 06/08/2018 12:32 PM      [facility]

## 2018-06-08 NOTE — Anesthesia Post-op Follow-up Note (Signed)
Anesthesia QCDR form completed.        

## 2018-06-08 NOTE — Consult Note (Signed)
Lucilla Lame, MD [facility]  [address], [place], [place] [address] Phone: [phone] Fax : [phone]  Consultation  Referring Provider:     Dr. Sidney Ace Primary Care Physician:  Patient, No Pcp Per Primary Gastroenterologist: Althia Forts         Reason for Consultation:     Cirrhosis with nausea vomiting diarrhea  Date of Admission:  06/08/2018 Date of Consultation:  06/08/2018         HPI:   Lisa Dennis is a 53 y.o. female who reports that she recently moved to this area from [place] where she had previously been diagnosed with alcoholic cirrhosis.  The patient states she is still drinking but came to the hospital because she is ready to detox and not drink anymore.  She reports that she has had an EGD and colonoscopy in the past with a report that she had some sort of tear found in her colon that was sutured up and varices in her colon with polyps in her stomach.  The ED reported that the patient had blood in her vomitus but she states that now it is not anything but bile.  She did have a witnessed coffee-ground emesis.  There is no report of any abdominal pain fevers chills or bright red blood per rectum.  The patient's nurse states that on the shift last night there was some dark material that appeared to be stool remnants that were very dark in color.  The patient was admitted with a hemoglobin of 8.9 with a repeat this morning at 7.2.  The patient is receiving 1 unit of blood today and has been n.p.o.  The patient reports that she only drinks wine usually and usually has 4 to 5 glasses of wine a day.  Past Medical History:  Diagnosis Date  . Alcohol abuse   . Anxiety   . Cirrhosis ([place])     History reviewed. No pertinent surgical history.  Prior to Admission medications   Not on File    History reviewed. No pertinent family history.   Social History   Tobacco Use  . Smoking status: Former Research scientist (life sciences)  . Smokeless tobacco: Never Used  Substance Use Topics   . Alcohol use: Yes    Comment: heavy  . Drug use: Not on file    Allergies as of 06/08/2018 - Review Complete 06/08/2018  Allergen Reaction Noted  . Benadryl [diphenhydramine] Itching 05/20/2018  . Morphine and related Itching 05/20/2018  . Penicillins  05/20/2018  . Sulfa antibiotics Itching 05/20/2018  . Tetracyclines & related Itching 05/20/2018    Review of Systems:    All systems reviewed and negative except where noted in HPI.   Physical Exam:  Vital signs in last 24 hours: Temp:  [98.2 F (36.8 C)-99.1 F (37.3 C)] 98.7 F (37.1 C) (03/16 1202) Pulse Rate:  [94-116] 98 (03/16 1202) Resp:  [16-18] 18 (03/16 1202) BP: (98-145)/(62-77) 131/77 (03/16 1202) SpO2:  [92 %-96 %] 93 % (03/16 1202) Weight:  [49.9 kg] 49.9 kg (03/16 0106) Last BM Date: 06/08/18 General:   Pleasant, cooperative in NAD Head:  Normocephalic and atraumatic. Eyes:   No icterus.   Conjunctiva pink. PERRLA. Ears:  Normal auditory acuity. Neck:  Supple; no masses or thyroidomegaly Lungs: Respirations even and unlabored. Lungs clear to auscultation bilaterally.   No wheezes, crackles, or rhonchi.  Heart:  Regular rate and rhythm;  Without murmur, clicks, rubs or gallops Abdomen:  Soft, nondistended, nontender. Normal bowel sounds.  No appreciable masses or hepatomegaly.  No rebound or guarding.  Rectal:  Not performed. Msk:  Symmetrical without gross deformities.    Extremities:  Without edema, cyanosis or clubbing. Neurologic:  Alert and oriented x3;  grossly normal neurologically. Skin:  Intact without significant lesions or rashes. Cervical Nodes:  No significant cervical adenopathy. Psych:  Alert and cooperative. Normal affect.  LAB RESULTS: Recent Labs    06/08/18 0108 06/08/18 0608  WBC 4.4  4.5  --   HGB 9.0*  8.9* 7.2*  HCT 28.2*  28.6* 23.4*  PLT 55*  54*  --    BMET Recent Labs    06/08/18 0108 06/08/18 0608  NA 138 138  K 4.5 3.8  CL 104 106  CO2 22 21*  GLUCOSE 129*  95  BUN 16 15  CREATININE 0.35* 0.39*  CALCIUM 8.6* 7.9*   LFT Recent Labs    06/08/18 0608  PROT 6.8  ALBUMIN 2.8*  AST 84*  ALT 31  ALKPHOS 64  BILITOT 4.2*   PT/INR Recent Labs    06/08/18 0109  LABPROT 17.9*  INR 1.5*    STUDIES: US Liver Doppler  Result Date: 06/08/2018 CLINICAL DATA:  Cirrhosis with nausea and vomiting EXAM: DUPLEX ULTRASOUND OF LIVER TECHNIQUE: Color and duplex Doppler ultrasound was performed to evaluate the hepatic in-flow and out-flow vessels. COMPARISON:  None. FINDINGS: Liver: Cirrhotic appearance of the liver at with diffuse heterogeneity and lobulated liver surface. No focal lesion, mass or intrahepatic biliary ductal dilatation. Main Portal Vein size: 1 cm Portal Vein Velocities Main Prox:  35 cm/sec Main Mid: 23 cm/sec Main Dist:  45 cm/sec Right: 50 cm/sec Left: 50 cm/sec Undulating portal venous flow attributable to cirrhosis. Hepatic Vein Velocities Right:  29 cm/sec Middle:  30 cm/sec Left:  48 cm/sec No significant waveform alteration. IVC: Present and patent with normal respiratory phasicity. Hepatic Artery Velocity: 110 cm/sec. There is normal morphology waveform Splenic Vein Velocity:  22 cm/sec Spleen: 7 cm x 12 cm x 3.5 cm with a total volume of 157 cm^3. Portal Vein Occlusion/Thrombus: No Splenic Vein Occlusion/Thrombus: No Ascites: None Varices: None visible IMPRESSION: 1. Antegrade flow within the portal venous system. 2. Cirrhosis without ascites or splenomegaly. Electronically Signed   By: Monte Fantasia M.D.   On: 06/08/2018 05:18   US Abdomen Limited Ruq  Result Date: 06/08/2018 CLINICAL DATA:  Nausea and vomiting.  History of alcoholic cirrhosis EXAM: ULTRASOUND ABDOMEN LIMITED RIGHT UPPER QUADRANT COMPARISON:  None. FINDINGS: Gallbladder: Full gallbladder with no wall thickening, focal tenderness, or stone. Common bile duct: Diameter: 3 mm Liver: Heterogeneous echotexture with lobulated liver surface correlating with history of  cirrhosis. No gross mass lesion. No ascites. Portal vein is patent on color Doppler imaging with normal direction of blood flow towards the liver. IMPRESSION: 1. No acute finding. 2. Cirrhosis. Electronically Signed   By: Monte Fantasia M.D.   On: 06/08/2018 05:14      Impression / Plan:   Assessment: Active Problems:   GI bleeding Nausea and vomiting Diarrhea  Sydell Prowell is a 53 y.o. y/o female with a history of alcoholic cirrhosis who comes in with nausea vomiting diarrhea and a report of dark stools with coffee-ground emesis witnessed in the ER although the patient states her vomitus is always just green.  The patient also reports that she is now the hospital because she would like to consider stopping alcohol abuse.   Plan: There is question of melena and hematemesis  and the patient will be set up for a EGD for today. I have discussed risks & benefits which include, but are not limited to, bleeding, infection, perforation & drug reaction.  The patient agrees with this plan & written consent will be obtained.     Thank you for involving me in the care of this patient.      LOS: 0 days   Lucilla Lame, MD  06/08/2018, 12:10 PM    Note: This dictation was prepared with Dragon dictation along with smaller phrase technology. Any transcriptional errors that result from this process are unintentional.

## 2018-06-08 NOTE — Progress Notes (Signed)
Patient briefly seen and examined.  Patient reports she drinks 4-5 beers a day.  She is going through EtOH withdrawal.  She presented with GI bleed.  Hemoglobin is low.  She is agreeable for blood transfusion.  1 unit PRBC for hemoglobin drop of 7.2.  Continue current care with octreotide and PPI.

## 2018-06-08 NOTE — Anesthesia Preprocedure Evaluation (Addendum)
Anesthesia Evaluation  Patient identified by MRN, date of birth, ID band Patient awake    Reviewed: Allergy & Precautions, H&P , NPO status , Patient's Chart, lab work & pertinent test results  Airway Mallampati: III  TM Distance: >3 FB     Dental  (+) Teeth Intact   Pulmonary former smoker,           Cardiovascular negative cardio ROS       Neuro/Psych PSYCHIATRIC DISORDERS Anxiety negative neurological ROS     GI/Hepatic negative GI ROS, (+) Cirrhosis   Esophageal Varices  substance abuse  alcohol use,   Endo/Other  negative endocrine ROS  Renal/GU negative Renal ROS  negative genitourinary   Musculoskeletal   Abdominal   Peds  Hematology negative hematology ROS (+)   Anesthesia Other Findings Last vomited yesterday.  No nausea today  Past Medical History: No date: Alcohol abuse No date: Anxiety No date: Cirrhosis ([place])  History reviewed. No pertinent surgical history.  BMI    Body Mass Index:  20.12 kg/m      Reproductive/Obstetrics negative OB ROS                            Anesthesia Physical Anesthesia Plan  ASA: III  Anesthesia Plan: General   Post-op Pain Management:    Induction:   PONV Risk Score and Plan: Propofol infusion and TIVA  Airway Management Planned: Nasal Cannula  Additional Equipment:   Intra-op Plan:   Post-operative Plan:   Informed Consent: I have reviewed the patients History and Physical, chart, labs and discussed the procedure including the risks, benefits and alternatives for the proposed anesthesia with the patient or authorized representative who has indicated his/her understanding and acceptance.     Dental Advisory Given  Plan Discussed with: Anesthesiologist and CRNA  Anesthesia Plan Comments:        Anesthesia Quick Evaluation

## 2018-06-08 NOTE — ED Provider Notes (Addendum)
Okay at the moment will sleepy  [facility] Emergency Department Provider Note  ____________________________________________   First MD Initiated Contact with Patient 06/08/18 0115     (approximate)  I have reviewed the triage vital signs and the nursing notes.   HISTORY  Chief Complaint Nausea; Emesis; and Diarrhea    HPI Lisa Dennis is a 53 y.o. female who reports history of cirrhosis and ongoing alcohol abuse who reports that she moved to this area about 3 months ago and has no local providers (recently relocated from [place]).  She presents by EMS for evaluation of vomiting and diarrhea as well as general malaise and generalized weakness.  In route to the Dennis on the ambulance she vomited up some coffee-ground emesis which she said was the first time she has done that.  She said that she saw a GI doctor in [place] and that she had had endoscopies, both upper and lower, and she is unaware of having a diagnosis of esophageal varices but she has not vomited up blood before.  She last had alcohol earlier today and reports that she is a daily drinker.  She is tremulous at this time is that she is cold.  She denies fever, cough, chest pain, and abdominal pain.  She has had some nausea but it is resolved after her vomiting episode.  She has had no dark stools recently.  She admits that alcohol is a problem but she is not suicidal and she wants help with her drinking.  She has been told that she has cirrhosis but does not know what her usual bilirubin is, if she has any trouble with coagulation, etc.  Her symptoms are currently severe and she says that she has had numerous bowel movements and has been not been able to eat or drink very much and she feels dehydrated and generalized weakness.  Nothing particular makes her symptoms better or worse.        Past Medical History:  Diagnosis Date   Alcohol abuse    Anxiety    Cirrhosis [facility])     Patient  Active Problem List   Diagnosis Date Noted   GI bleeding 06/08/2018    History reviewed. No pertinent surgical history.  Prior to Admission medications   Not on File    Allergies Benadryl [diphenhydramine]; Morphine and related; Penicillins; Sulfa antibiotics; and Tetracyclines & related  History reviewed. No pertinent family history.  Social History Social History   Tobacco Use   Smoking status: Former Smoker   Smokeless tobacco: Never Used  Substance Use Topics   Alcohol use: Yes    Comment: heavy   Drug use: Not on file    Review of Systems Constitutional: No fever/chills.  General malaise and weakness. Eyes: No visual changes. ENT: No sore throat. Cardiovascular: Denies chest pain. Respiratory: Denies shortness of breath. Gastrointestinal: No abdominal pain but nausea, multiple episodes of diarrhea, and coffee-ground hematemesis. Genitourinary: Negative for dysuria. Musculoskeletal: Negative for neck pain.  Negative for back pain. Integumentary: Negative for rash. Neurological: Negative for headaches, focal weakness or numbness.   ____________________________________________   PHYSICAL EXAM:  VITAL SIGNS: ED Triage Vitals [06/08/18 0106]  Enc Vitals Group     BP (!) 145/76     Pulse Rate 100     Resp 16     Temp      Temp src      SpO2 96 %     Weight 49.9 kg (110 lb)  Height 1.575 m (5\' 2" )     Head Circumference      Peak Flow      Pain Score 0     Pain Loc      Pain Edu?      Excl. in [place]?     Constitutional: Alert and oriented.  Appears somewhat chronically ill but is not in acute distress. Eyes: Scleral icterus, pupils are equal and reactive. Head: Atraumatic. Nose: No congestion/rhinnorhea. Mouth/Throat: Mucous membranes are dry. Neck: No stridor.  No meningeal signs.   Cardiovascular: Borderline tachycardia, regular rhythm. Good peripheral circulation. Grossly normal heart sounds. Respiratory: Normal respiratory effort.  No  retractions. Lungs CTAB. Gastrointestinal: Soft and nontender. No distention with no evidence of ascites.  Right upper quadrant is not tender to palpation with negative Murphy sign. Musculoskeletal: No lower extremity tenderness nor edema. No gross deformities of extremities. Neurologic:  Normal speech and language.  Patient has a resting tremor of both hands consistent with some mild alcohol withdrawal. Skin:  Skin is jaundiced, warm, dry and intact. No rash noted.   ____________________________________________   LABS (all labs ordered are listed, but only abnormal results are displayed)  Labs Reviewed  COMPREHENSIVE METABOLIC PANEL - Abnormal; Notable for the following components:      Result Value   Glucose, Bld 129 (*)    Creatinine, Ser 0.35 (*)    Calcium 8.6 (*)    Albumin 3.4 (*)    AST 130 (*)    Total Bilirubin 4.9 (*)    All other components within normal limits  CBC - Abnormal; Notable for the following components:   RBC 3.12 (*)    Hemoglobin 8.9 (*)    HCT 28.6 (*)    RDW 21.5 (*)    Platelets 54 (*)    All other components within normal limits  URINALYSIS, COMPLETE (UACMP) WITH MICROSCOPIC - Abnormal; Notable for the following components:   Color, Urine AMBER (*)    APPearance CLEAR (*)    Protein, ur 30 (*)    All other components within normal limits  PROTIME-INR - Abnormal; Notable for the following components:   Prothrombin Time 17.9 (*)    INR 1.5 (*)    All other components within normal limits  APTT - Abnormal; Notable for the following components:   aPTT 41 (*)    All other components within normal limits  ETHANOL - Abnormal; Notable for the following components:   Alcohol, Ethyl (B) 144 (*)    All other components within normal limits  CBC - Abnormal; Notable for the following components:   RBC 3.10 (*)    Hemoglobin 9.0 (*)    HCT 28.2 (*)    RDW 21.2 (*)    Platelets 55 (*)    All other components within normal limits  GASTROINTESTINAL  PANEL BY PCR, STOOL (REPLACES STOOL CULTURE)  LIPASE, BLOOD  TECHNOLOGIST SMEAR REVIEW  DIFFERENTIAL  HEPATITIS PANEL, ACUTE  HEMOGLOBIN  HEMOGLOBIN  HEMOGLOBIN  HEMOGLOBIN  HEMATOCRIT  HEMATOCRIT  HEMATOCRIT  COMPREHENSIVE METABOLIC PANEL  TYPE AND SCREEN   ____________________________________________  EKG  No indication for EKG ____________________________________________  RADIOLOGY   ED MD interpretation: Cirrhosis, no other acute abnormality  Official radiology report(s): US Abdomen Limited Ruq  Result Date: 06/08/2018 CLINICAL DATA:  Nausea and vomiting.  History of alcoholic cirrhosis EXAM: ULTRASOUND ABDOMEN LIMITED RIGHT UPPER QUADRANT COMPARISON:  None. FINDINGS: Gallbladder: Full gallbladder with no wall thickening, focal tenderness, or stone. Common bile duct: Diameter: 3  mm Liver: Heterogeneous echotexture with lobulated liver surface correlating with history of cirrhosis. No gross mass lesion. No ascites. Portal vein is patent on color Doppler imaging with normal direction of blood flow towards the liver. IMPRESSION: 1. No acute finding. 2. Cirrhosis. Electronically Signed   By: Monte Fantasia M.D.   On: 06/08/2018 05:14    ____________________________________________   PROCEDURES   Procedure(s) performed (including Critical Care):  .Critical Care Performed by: Hinda Kehr, MD Authorized by: Hinda Kehr, MD   Critical care provider statement:    Critical care time (minutes):  30   Critical care time was exclusive of:  Separately billable procedures and treating other patients   Critical care was necessary to treat or prevent imminent or life-threatening deterioration of the following conditions: upper GI bleeding in the setting of liver failure and coagulopathy.   Critical care was time spent personally by me on the following activities:  Development of treatment plan with patient or surrogate, discussions with consultants, evaluation of patient's  response to treatment, examination of patient, obtaining history from patient or surrogate, ordering and performing treatments and interventions, ordering and review of laboratory studies, ordering and review of radiographic studies, pulse oximetry, re-evaluation of patient's condition and review of old charts     ____________________________________________   [place] / MDM / [place] / ED COURSE  As part of my medical decision making, I reviewed the following data within the [place] notes reviewed and incorporated, Labs reviewed , Old chart reviewed, Discussed with admitting physician , A consult was requested and the case was discussed by phone with this/these consultant(s) Gastroenterology (Dr. Marius Ditch) and Notes from prior ED visits     Differential diagnosis includes, but is not limited to, worsening liver failure, esophageal varices, coagulopathy, choledocholithiasis, hemorrhagic gastritis, duodenal or gastric ulcer.  The patient has no abdominal pain or tenderness but I personally visualized the hematemesis that she produced just prior to arrival.  Given that I cannot find any electronic medical record indicating the severity of her illness and she is not able to tell me, I will perform a broad evaluation.  I am also going to treat empirically for the possibility of esophageal varices with octreotide 50 mcg IV bolus and infusion of 50 mcg/h.  I am also ordering pantoprazole 40 mg IV bolus, 1 L normal saline IV bolus, and Zofran 4 mg IV.  I will order a right upper quadrant ultrasound for further evaluation but there is no indication for CT scan at this time and I do not think it would change the disposition.  She is also having some evidence of alcohol withdrawal and I am giving her 1 mg of Ativan IV as well as checking an ethanol level and putting her on CIWA precautions.  She is getting thiamine 701 mg IV and folic acid 1 mg by mouth.  She is in  no distress currently.  She will require inpatient treatment.  Clinical Course as of Jun 07 517  Mon Jun 08, 2018  0133 Total Bilirubin(!): 4.9 [CF]  0245 Normal UA  Urinalysis, Complete w Microscopic(!) [CF]  0245 Alcohol, Ethyl (B)(!): 144 [CF]  0245 Tech Review: NO SCHISTOCYTES SEEN [CF]  0303 Lab work is consistent with chronic liver disease, possibly acute on chronic.  She has a bilirubin of nearly 5 but only a very mild elevation of her AST of 130.  Creatinine is low.  Her electrolytes are normal and her CBC is reassuring  in terms of no leukocytosis but she is thrombocytopenic and anemic but I do not know her baseline RBCs.  I have ordered a type and screen but she does not require transfusion at this time and no schistocytes were seen on the peripheral smear.  Urinalysis is unremarkable.  Ethanol level is elevated at 144 and she is slightly coagulopathic with an INR of 1.5 and an elevated APTT.  Ultrasound is still pending but I have paged the hospitalist to discuss admission.  Patient is stable and in no acute distress.   [CF]  [id number] I discussed the case with the hospitalist who asked me to also speak with GI.  I discussed the case by phone with Dr. Marius Ditch who requested that I give ceftriaxone 2 g IV, obtain Dopplers along with the liver ultrasound, and due to the pantoprazole infusion as well as the bolus.  I have updated the orders and will once again discussed with Dr. Lodema Pilot with the hospitalist service.   [CF]  [id number] Discussed again in person with Dr. Lodema Pilot who will admit.   [CF]    Clinical Course User Index [CF] Hinda Kehr, MD    ____________________________________________  FINAL CLINICAL IMPRESSION(S) / ED DIAGNOSES  Final diagnoses:  Hematemesis without nausea  Alcohol withdrawal syndrome with complication (HCC)  Diarrhea, unspecified type  Hyperbilirubinemia  Elevated INR  Alcoholic cirrhosis of liver without ascites (HCC)  Anemia, unspecified type    Thrombocytopenia (HCC)  Coagulopathy (HCC)  Cirrhosis ([place])     MEDICATIONS GIVEN DURING THIS VISIT:  Medications  octreotide (SANDOSTATIN) 2 mcg/mL load via infusion 50 mcg (50 mcg Intravenous Bolus from Bag 06/08/18 0336)    And  octreotide (SANDOSTATIN) 500 mcg in sodium chloride 0.9 % 250 mL (2 mcg/mL) infusion (50 mcg/hr Intravenous New Bag/Given 06/08/18 0342)  pantoprazole (PROTONIX) 80 mg in sodium chloride 0.9 % 250 mL (0.32 mg/mL) infusion (8 mg/hr Intravenous New Bag/Given 06/08/18 0357)  cefTRIAXone (ROCEPHIN) 2 g in sodium chloride 0.9 % 100 mL IVPB (2 g Intravenous New Bag/Given 06/08/18 0503)  sodium chloride 0.9 % 1,000 mL with thiamine 784 mg, folic acid 1 mg, multivitamins adult 10 mL infusion (has no administration in time range)  acetaminophen (TYLENOL) tablet 650 mg (has no administration in time range)    Or  acetaminophen (TYLENOL) suppository 650 mg (has no administration in time range)  traMADol (ULTRAM) tablet 50 mg (has no administration in time range)  traZODone (DESYREL) tablet 25 mg (has no administration in time range)  ondansetron (ZOFRAN) tablet 4 mg (has no administration in time range)    Or  ondansetron (ZOFRAN) injection 4 mg (has no administration in time range)  LORazepam (ATIVAN) tablet 1 mg (has no administration in time range)  LORazepam (ATIVAN) tablet 1 mg (has no administration in time range)    Followed by  LORazepam (ATIVAN) tablet 1 mg (has no administration in time range)    Followed by  LORazepam (ATIVAN) tablet 1 mg (has no administration in time range)    Followed by  LORazepam (ATIVAN) tablet 1 mg (has no administration in time range)  hydrOXYzine (ATARAX/VISTARIL) tablet 25 mg (has no administration in time range)  multivitamin with minerals tablet 1 tablet (has no administration in time range)  sodium chloride flush (NS) 0.9 % injection 3 mL (3 mLs Intravenous Given 06/08/18 0115)  thiamine (B-1) injection 100 mg (100 mg  Intravenous Given 06/08/18 0140)  sodium chloride 0.9 % bolus 1,000 mL (1,000 mLs Intravenous New Bag/Given 06/08/18  1610)  folic acid (FOLVITE) tablet 1 mg (1 mg Oral Given 06/08/18 0229)  LORazepam (ATIVAN) injection 1 mg (1 mg Intravenous Given 06/08/18 0227)  pantoprazole (PROTONIX) injection 40 mg (40 mg Intravenous Given 06/08/18 0224)  ondansetron (ZOFRAN) injection 4 mg (4 mg Intravenous Given 06/08/18 0223)  pantoprazole (PROTONIX) injection 40 mg (40 mg Intravenous Given 06/08/18 0353)     ED Discharge Orders    None       Note:  This document was prepared using Dragon voice recognition software and may include unintentional dictation errors.   Hinda Kehr, MD 06/08/18 Sabino Donovan    Hinda Kehr, MD 06/08/18 [phone]

## 2018-06-09 ENCOUNTER — Encounter: Payer: Self-pay | Admitting: Gastroenterology

## 2018-06-09 LAB — BPAM RBC
Blood Product Expiration Date: 202004042359
ISSUE DATE / TIME: 202003161156
UNIT TYPE AND RH: 6200

## 2018-06-09 LAB — HEPATITIS PANEL, ACUTE
HCV Ab: <0.1 s/co ratio (ref 0.0–0.9)
Hep A IgM: NEGATIVE
Hep B C IgM: NEGATIVE
Hepatitis B Surface Ag: NEGATIVE

## 2018-06-09 LAB — TYPE AND SCREEN
ABO/RH(D): A POS
Antibody Screen: NEGATIVE
Unit division: 0

## 2018-06-09 LAB — CBC
HCT: 25.6 % — ABNORMAL LOW (ref 36.0–46.0)
Hemoglobin: 8 g/dL — ABNORMAL LOW (ref 12.0–15.0)
MCH: 29.1 pg (ref 26.0–34.0)
MCHC: 31.3 g/dL (ref 30.0–36.0)
MCV: 93.1 fL (ref 80.0–100.0)
Platelets: 40 10*3/uL — ABNORMAL LOW (ref 150–400)
RBC: 2.75 MIL/uL — ABNORMAL LOW (ref 3.87–5.11)
RDW: 21 % — ABNORMAL HIGH (ref 11.5–15.5)
WBC: 3.7 10*3/uL — ABNORMAL LOW (ref 4.0–10.5)
nRBC: 0 % (ref 0.0–0.2)

## 2018-06-09 LAB — BASIC METABOLIC PANEL
ANION GAP: 7 (ref 5–15)
BUN: 11 mg/dL (ref 6–20)
CO2: 21 mmol/L — AB (ref 22–32)
Calcium: 7.6 mg/dL — ABNORMAL LOW (ref 8.9–10.3)
Chloride: 109 mmol/L (ref 98–111)
Creatinine, Ser: 0.44 mg/dL (ref 0.44–1.00)
GFR calc Af Amer: >60 mL/min (ref >60)
GFR calc non Af Amer: >60 mL/min (ref >60)
GLUCOSE: 87 mg/dL (ref 70–99)
Potassium: 3.3 mmol/L — ABNORMAL LOW (ref 3.5–5.1)
Sodium: 137 mmol/L (ref 135–145)

## 2018-06-09 MED ORDER — PANTOPRAZOLE SODIUM 40 MG PO TBEC: 40.0000 mg | DELAYED_RELEASE_TABLET | Freq: Every day | ORAL | 0 refills | Status: DC

## 2018-06-09 MED ORDER — POTASSIUM CHLORIDE CRYS ER 20 MEQ PO TBCR
40.0000 meq | EXTENDED_RELEASE_TABLET | Freq: Once | ORAL | Status: AC
  Administered 2018-06-09: 40 meq via ORAL
  Filled 2018-06-09: qty 2

## 2018-06-09 NOTE — Progress Notes (Signed)
Discharge instructions reviewed with patient. Patient verbalized understanding. Patient states that her friend's significant other will be picking her up and taking her home. Patient to be transported to vehicle via wheelchair with belongings. Rx given to patient.

## 2018-06-09 NOTE — Discharge Summary (Signed)
[facility] [place] NAME: Lisa [facility]    MR#:  [id number]  DATE OF BIRTH:  06-23-1965  DATE OF ADMISSION:  06/08/2018 ADMITTING PHYSICIAN: Arta Silence, MD  DATE OF DISCHARGE: 06/09/2018  PRIMARY CARE PHYSICIAN: Patient, No Pcp Per    ADMISSION DIAGNOSIS:  Hyperbilirubinemia [E80.6] Coagulopathy ([place]) [D68.9] Cirrhosis ([place]) [K74.60] Thrombocytopenia (HCC) [D69.6] Elevated INR [W38.9] Alcoholic cirrhosis of liver without ascites (HCC) [K70.30] Alcohol withdrawal syndrome with complication (HCC) [H73.428] Hematemesis without nausea [K92.0] Anemia, unspecified type [D64.9] Diarrhea, unspecified type [R19.7]  DISCHARGE DIAGNOSIS:  Active Problems:   GI bleeding   Alcoholic cirrhosis of liver without ascites ([place])   Hematemesis without nausea   SECONDARY DIAGNOSIS:   Past Medical History:  Diagnosis Date  . Alcohol abuse   . Anxiety   . Cirrhosis [facility])     HOSPITAL COURSE:    53 year old female with a history of liver cirrhosis due to EtOH abuse who presented to the hospital due to hematemesis.  1.  Coffee-ground emesis: Patient underwent EGD which showed grade 1 esophageal varices and mild portal hypertensive gastropathy.  GI has recommended Protonix 40 mg daily.  She was initially initiated on octreotide as well as Protonix drip.  These have now been discontinued.  2.  Acute on chronic blood loss anemia due to problem #1: Patient received 1 unit PRBC.   Discharge hemoglobin is 8.0 which has been stable since her blood transfusion. 3.  EtOH abuse: Patient was on CIWA protocol with uneventful detox.  4.  Hypokalemia: This was repleted   DISCHARGE CONDITIONS AND DIET:  Stable regular diet  CONSULTS OBTAINED:  Treatment Team:  Lucilla Lame, MD  DRUG ALLERGIES:   Allergies  Allergen Reactions  . Benadryl [Diphenhydramine] Itching  . Morphine And Related Itching  . Penicillins   . Sulfa Antibiotics  Itching  . Tetracyclines & Related Itching    DISCHARGE MEDICATIONS:   Allergies as of 06/09/2018      Reactions   Benadryl [diphenhydramine] Itching   Morphine And Related Itching   Penicillins    Sulfa Antibiotics Itching   Tetracyclines & Related Itching      Medication List    TAKE these medications   pantoprazole 40 MG tablet Commonly known as:  Protonix Take 1 tablet (40 mg total) by mouth daily.         Today   CHIEF COMPLAINT:  Wants to go home Has support at home to quit drinking   VITAL SIGNS:  Blood pressure 109/65, pulse 84, temperature 98.7 F (37.1 C), temperature source Oral, resp. rate 18, height 5\' 2"  (1.575 m), weight 49.9 kg, SpO2 92 %.   REVIEW OF SYSTEMS:  Review of Systems  Constitutional: Negative.  Negative for chills, fever and malaise/fatigue.  HENT: Negative.  Negative for ear discharge, ear pain, hearing loss, nosebleeds and sore throat.   Eyes: Negative.  Negative for blurred vision and pain.  Respiratory: Negative.  Negative for cough, hemoptysis, shortness of breath and wheezing.   Cardiovascular: Negative.  Negative for chest pain, palpitations and leg swelling.  Gastrointestinal: Negative.  Negative for abdominal pain, blood in stool, diarrhea, nausea and vomiting.  Genitourinary: Negative.  Negative for dysuria.  Musculoskeletal: Negative.  Negative for back pain.  Skin: Negative.   Neurological: Negative for dizziness, tremors, speech change, focal weakness, seizures and headaches.  Endo/Heme/Allergies: Negative.  Does not bruise/bleed easily.  Psychiatric/Behavioral: Negative.  Negative for depression, hallucinations and suicidal ideas.  PHYSICAL EXAMINATION:  GENERAL:  53 y.o.-year-old patient lying in the bed with no acute distress.  NECK:  Supple, no jugular venous distention. No thyroid enlargement, no tenderness.  LUNGS: Normal breath sounds bilaterally, no wheezing, rales,rhonchi  No use of accessory muscles of  respiration.  CARDIOVASCULAR: S1, S2 normal. No murmurs, rubs, or gallops.  ABDOMEN: Soft, non-tender, non-distended. Bowel sounds present. No organomegaly or mass.  EXTREMITIES: No pedal edema, cyanosis, or clubbing.  PSYCHIATRIC: The patient is alert and oriented x 3.  SKIN: No obvious rash, lesion, or ulcer.   DATA REVIEW:   CBC Recent Labs  Lab 06/09/18 0411  WBC 3.7*  HGB 8.0*  HCT 25.6*  PLT 40*    Chemistries  Recent Labs  Lab 06/08/18 0608 06/09/18 0411  NA 138 137  K 3.8 3.3*  CL 106 109  CO2 21* 21*  GLUCOSE 95 87  BUN 15 11  CREATININE 0.39* 0.44  CALCIUM 7.9* 7.6*  AST 84*  --   ALT 31  --   ALKPHOS 64  --   BILITOT 4.2*  --     Cardiac Enzymes No results for input(s): TROPONINI in the last 168 hours.  Microbiology Results  @[email or web address] @  RADIOLOGY:  US Liver Doppler  Result Date: 06/08/2018 CLINICAL DATA:  Cirrhosis with nausea and vomiting EXAM: DUPLEX ULTRASOUND OF LIVER TECHNIQUE: Color and duplex Doppler ultrasound was performed to evaluate the hepatic in-flow and out-flow vessels. COMPARISON:  None. FINDINGS: Liver: Cirrhotic appearance of the liver at with diffuse heterogeneity and lobulated liver surface. No focal lesion, mass or intrahepatic biliary ductal dilatation. Main Portal Vein size: 1 cm Portal Vein Velocities Main Prox:  35 cm/sec Main Mid: 23 cm/sec Main Dist:  45 cm/sec Right: 50 cm/sec Left: 50 cm/sec Undulating portal venous flow attributable to cirrhosis. Hepatic Vein Velocities Right:  29 cm/sec Middle:  30 cm/sec Left:  48 cm/sec No significant waveform alteration. IVC: Present and patent with normal respiratory phasicity. Hepatic Artery Velocity: 110 cm/sec. There is normal morphology waveform Splenic Vein Velocity:  22 cm/sec Spleen: 7 cm x 12 cm x 3.5 cm with a total volume of 157 cm^3. Portal Vein Occlusion/Thrombus: No Splenic Vein Occlusion/Thrombus: No Ascites: None Varices: None visible IMPRESSION: 1. Antegrade flow within  the portal venous system. 2. Cirrhosis without ascites or splenomegaly. Electronically Signed   By: Monte Fantasia M.D.   On: 06/08/2018 05:18   US Abdomen Limited Ruq  Result Date: 06/08/2018 CLINICAL DATA:  Nausea and vomiting.  History of alcoholic cirrhosis EXAM: ULTRASOUND ABDOMEN LIMITED RIGHT UPPER QUADRANT COMPARISON:  None. FINDINGS: Gallbladder: Full gallbladder with no wall thickening, focal tenderness, or stone. Common bile duct: Diameter: 3 mm Liver: Heterogeneous echotexture with lobulated liver surface correlating with history of cirrhosis. No gross mass lesion. No ascites. Portal vein is patent on color Doppler imaging with normal direction of blood flow towards the liver. IMPRESSION: 1. No acute finding. 2. Cirrhosis. Electronically Signed   By: Monte Fantasia M.D.   On: 06/08/2018 05:14      Allergies as of 06/09/2018      Reactions   Benadryl [diphenhydramine] Itching   Morphine And Related Itching   Penicillins    Sulfa Antibiotics Itching   Tetracyclines & Related Itching      Medication List    TAKE these medications   pantoprazole 40 MG tablet Commonly known as:  Protonix Take 1 tablet (40 mg total) by mouth daily.  Management plans discussed with the patient and she is in agreement. Stable for discharge home  Patient should follow up with GI  CODE STATUS:     Code Status Orders  (From admission, onward)         Start     Ordered   06/08/18 0413  Full code  Continuous     06/08/18 0424        Code Status History    This patient has a current code status but no historical code status.      TOTAL TIME TAKING CARE OF THIS PATIENT: 38 minutes.    Note: This dictation was prepared with Dragon dictation along with smaller phrase technology. Any transcriptional errors that result from this process are unintentional.  Bettey Costa M.D on 06/09/2018 at 10:06 AM  Between 7am to 6pm - Pager - [phone] After 6pm go to [email or web address] -  password [email or web address]  Office  [phone]  CC: Primary care physician; Patient, No Pcp Per

## 2018-07-21 ENCOUNTER — Ambulatory Visit: Payer: BLUE CROSS/BLUE SHIELD | Admitting: Gastroenterology

## 2018-08-06 ENCOUNTER — Telehealth: Payer: Self-pay | Admitting: Gastroenterology

## 2018-08-06 NOTE — Telephone Encounter (Signed)
I called patient to r/s her appointment and She wanted me to let Dr Allen Norris know she has been unable to get her prescriptions filled since her discharge from the hospital. She will try to get it filled prior to next appt. She has just moved her & is unfamiliar with the area & staying in because of Covid.

## 2018-08-06 NOTE — Telephone Encounter (Signed)
Thank you for the message. I will let Dr. Allen Norris know before appt.

## 2018-08-18 ENCOUNTER — Other Ambulatory Visit: Payer: Self-pay

## 2018-08-18 ENCOUNTER — Encounter: Payer: Self-pay | Admitting: Emergency Medicine

## 2018-08-18 ENCOUNTER — Emergency Department: Payer: BLUE CROSS/BLUE SHIELD

## 2018-08-18 DIAGNOSIS — Y92009 Unspecified place in unspecified non-institutional (private) residence as the place of occurrence of the external cause: Secondary | ICD-10-CM | POA: Insufficient documentation

## 2018-08-18 DIAGNOSIS — Z87891 Personal history of nicotine dependence: Secondary | ICD-10-CM | POA: Insufficient documentation

## 2018-08-18 DIAGNOSIS — Z880 Allergy status to penicillin: Secondary | ICD-10-CM | POA: Insufficient documentation

## 2018-08-18 DIAGNOSIS — Y9301 Activity, walking, marching and hiking: Secondary | ICD-10-CM | POA: Insufficient documentation

## 2018-08-18 DIAGNOSIS — Y999 Unspecified external cause status: Secondary | ICD-10-CM | POA: Diagnosis not present

## 2018-08-18 DIAGNOSIS — W19XXXA Unspecified fall, initial encounter: Secondary | ICD-10-CM | POA: Diagnosis not present

## 2018-08-18 DIAGNOSIS — E860 Dehydration: Secondary | ICD-10-CM | POA: Insufficient documentation

## 2018-08-18 DIAGNOSIS — S0990XA Unspecified injury of head, initial encounter: Secondary | ICD-10-CM | POA: Diagnosis not present

## 2018-08-18 DIAGNOSIS — Z79899 Other long term (current) drug therapy: Secondary | ICD-10-CM | POA: Diagnosis not present

## 2018-08-18 DIAGNOSIS — S60212A Contusion of left wrist, initial encounter: Secondary | ICD-10-CM | POA: Diagnosis not present

## 2018-08-18 LAB — CBC
HCT: 33.1 % — ABNORMAL LOW (ref 36.0–46.0)
Hemoglobin: 10.5 g/dL — ABNORMAL LOW (ref 12.0–15.0)
MCH: 30.5 pg (ref 26.0–34.0)
MCHC: 31.7 g/dL (ref 30.0–36.0)
MCV: 96.2 fL (ref 80.0–100.0)
Platelets: 49 10*3/uL — ABNORMAL LOW (ref 150–400)
RBC: 3.44 MIL/uL — ABNORMAL LOW (ref 3.87–5.11)
RDW: 19 % — ABNORMAL HIGH (ref 11.5–15.5)
WBC: 4.2 10*3/uL (ref 4.0–10.5)
nRBC: 0 % (ref 0.0–0.2)

## 2018-08-18 LAB — BASIC METABOLIC PANEL
Anion gap: 9 (ref 5–15)
BUN: 8 mg/dL (ref 6–20)
CO2: 26 mmol/L (ref 22–32)
Calcium: 9 mg/dL (ref 8.9–10.3)
Chloride: 105 mmol/L (ref 98–111)
Creatinine, Ser: 0.52 mg/dL (ref 0.44–1.00)
GFR calc Af Amer: >60 mL/min (ref >60)
GFR calc non Af Amer: >60 mL/min (ref >60)
Glucose, Bld: 120 mg/dL — ABNORMAL HIGH (ref 70–99)
Potassium: 3.7 mmol/L (ref 3.5–5.1)
Sodium: 140 mmol/L (ref 135–145)

## 2018-08-18 LAB — TROPONIN I: Troponin I: <0.03 ng/mL (ref <0.03)

## 2018-08-18 NOTE — ED Triage Notes (Signed)
Patient states she was getting out of bed and fell. Denies any dizziness at any point. Denies any LOC. Patient states she fell straight backwards on hardwood floors. Patient states her whole body hurts, did hit head. Patient is unsure of what exactly caused her to fall.

## 2018-08-19 ENCOUNTER — Emergency Department
Admission: EM | Admit: 2018-08-19 | Discharge: 2018-08-19 | Disposition: A | Discharge status: Home / Self Care | Payer: BLUE CROSS/BLUE SHIELD | Attending: Emergency Medicine | Admitting: Emergency Medicine

## 2018-08-19 DIAGNOSIS — W19XXXA Unspecified fall, initial encounter: Secondary | ICD-10-CM

## 2018-08-19 DIAGNOSIS — S60212A Contusion of left wrist, initial encounter: Secondary | ICD-10-CM

## 2018-08-19 DIAGNOSIS — S0990XA Unspecified injury of head, initial encounter: Secondary | ICD-10-CM

## 2018-08-19 DIAGNOSIS — E860 Dehydration: Secondary | ICD-10-CM

## 2018-08-19 MED ORDER — SODIUM CHLORIDE 0.9 % IV BOLUS
1000.0000 mL | Freq: Once | INTRAVENOUS | Status: AC
  Administered 2018-08-19: 1000 mL via INTRAVENOUS

## 2018-08-19 NOTE — Discharge Instructions (Addendum)
Make sure to drink plenty of fluids.  You should follow-up with the gastroenterologist as scheduled.  In addition we have provided referral information for one of our hematologists as well as with our internal medicine practice.  You have a low platelet count.  This has been chronic, but it can cause problems such as severe bleeding.  It is very important that you follow-up both with a primary care doctor and a hematologist.  Return to the ER for new, worsening, or persistent weakness, recurrent falls, abnormal bleeding, or any other new or worsening symptoms that concern you.

## 2018-08-19 NOTE — ED Notes (Signed)
Pt to room 3 via w/c with no distress noted; st PTA she stood up OOB and "lost her balance" falling backwards hitting back of head; denies LOC, denies c/o at present; small bruising noted to outer left wrist; pt st she was concerned because she couldn't get up out of the floor due to feeling weak; st hx of same with anemia and is concerned over such; resp even/unlab, lungs clear, apical audible & regular, +BS, abd soft/nondist/nontender, strong & = periph pulses

## 2018-08-19 NOTE — ED Provider Notes (Signed)
[facility] Emergency Department Provider Note ____________________________________________   First MD Initiated Contact with Patient 08/19/18 0020     (approximate)  I have reviewed the triage vital signs and the nursing notes.   HISTORY  Chief Complaint Fall    HPI Lisa Dennis Lisa Dennis is a 53 y.o. female with PMH as noted below who presents after a fall, acute onset this evening when the patient got up from bed to use the bathroom.  The patient states she is unsure exactly how she fell.  She is unclear if she lost her balance, if the floor was slippery, or if she briefly got lightheaded.  However, she denies any prodrome of weakness or lightheadedness.  She states that she felt too weak to get up after she was on the ground.  The patient states she feels like she has no energy.  She has been anemic in the past and was worried that maybe she is anemic again.  The patient also reports daily alcohol use, around 4 glasses of wine each evening.  Past Medical History:  Diagnosis Date  . Alcohol abuse   . Anxiety   . Cirrhosis [facility])     Patient Active Problem List   Diagnosis Date Noted  . GI bleeding 06/08/2018  . Alcoholic cirrhosis of liver without ascites ([place])   . Hematemesis without nausea     Past Surgical History:  Procedure Laterality Date  . ESOPHAGOGASTRODUODENOSCOPY (EGD) WITH PROPOFOL N/A 06/08/2018   Procedure: ESOPHAGOGASTRODUODENOSCOPY (EGD) WITH PROPOFOL;  Surgeon: Lucilla Lame, MD;  Location: [facility] ENDOSCOPY;  Service: Endoscopy;  Laterality: N/A;    Prior to Admission medications   Medication Sig Start Date End Date Taking? Authorizing Provider  pantoprazole (PROTONIX) 40 MG tablet Take 1 tablet (40 mg total) by mouth daily. 06/09/18   Bettey Costa, MD    Allergies Benadryl [diphenhydramine]; Morphine and related; Penicillins; Sulfa antibiotics; and Tetracyclines & related  No family history on file.  Social History Social  History   Tobacco Use  . Smoking status: Former Research scientist (life sciences)  . Smokeless tobacco: Never Used  Substance Use Topics  . Alcohol use: Yes    Comment: heavy  . Drug use: Not on file    Review of Systems  Constitutional: No fever.  Positive for fatigue. Eyes: No visual changes. ENT: No neck pain. Cardiovascular: Denies chest pain. Respiratory: Denies shortness of breath. Gastrointestinal: No vomiting or diarrhea.  Genitourinary: Negative for flank pain.  Musculoskeletal: Positive for resolved back pain. Skin: Negative for rash. Neurological: Negative for headache.   ____________________________________________   PHYSICAL EXAM:  VITAL SIGNS: ED Triage Vitals  Enc Vitals Group     BP 08/18/18 2237 132/77     Pulse Rate 08/18/18 2237 92     Resp 08/18/18 2237 20     Temp 08/18/18 2237 98.5 F (36.9 C)     Temp Source 08/18/18 2237 Oral     SpO2 08/18/18 2237 97 %     Weight 08/18/18 2238 98 lb (44.5 kg)     Height 08/18/18 2238 5\' 1"  (1.549 m)     Head Circumference --      Peak Flow --      Pain Score 08/18/18 2238 0     Pain Loc --      Pain Edu? --      Excl. in [place]? --     Constitutional: Alert and oriented. Well appearing and in no acute distress. Eyes: Conjunctivae are normal.  Head: Atraumatic. Nose: No congestion/rhinnorhea. Mouth/Throat: Mucous membranes are slightly dry.   Neck: Normal range of motion.  Cardiovascular: Normal rate, regular rhythm.Good peripheral circulation. Respiratory: Normal respiratory effort.  Gastrointestinal: Soft and nontender. No distention.  Genitourinary: No flank tenderness. Musculoskeletal: No lower extremity edema.  Extremities warm and well perfused.  Faint ecchymosis to left wrist with no bony tenderness or deformity. Neurologic:  Normal speech and language.  Motor intact in all extremities.  Normal coordination with no ataxia on finger-to-nose.  No pronator drift.  No tremors. Skin:  Skin is warm and dry. No rash noted.  Psychiatric: Mood and affect are normal. Speech and behavior are normal.  ____________________________________________   LABS (all labs ordered are listed, but only abnormal results are displayed)  Labs Reviewed  BASIC METABOLIC PANEL - Abnormal; Notable for the following components:      Result Value   Glucose, Bld 120 (*)    All other components within normal limits  CBC - Abnormal; Notable for the following components:   RBC 3.44 (*)    Hemoglobin 10.5 (*)    HCT 33.1 (*)    RDW 19.0 (*)    Platelets 49 (*)    All other components within normal limits  TROPONIN I   ____________________________________________  EKG  ED ECG REPORT I, Arta Silence, the attending physician, personally viewed and interpreted this ECG.  Date: 08/19/2018 EKG Time: 2255 Rate: 86 Rhythm: normal sinus rhythm QRS Axis: normal Intervals: normal ST/T Wave abnormalities: normal Narrative Interpretation: no evidence of acute ischemia  ____________________________________________  RADIOLOGY  CT head: No ICH CT cervical spine: No acute fracture  ____________________________________________   PROCEDURES  Procedure(s) performed: No  Procedures  Critical Care performed: No ____________________________________________   INITIAL IMPRESSION / ASSESSMENT AND PLAN / ED COURSE  Pertinent labs & imaging results that were available during my care of the patient were reviewed by me and considered in my medical decision making (see chart for details).  53 year old female with PMH as noted above presents after a fall from standing height when she got out of bed to use the bathroom.  The patient states that she is unsure if she slipped, but denies any significant prodrome.  She has been feeling weak and fatigued in general, and felt too weak to get up after the fall.  She states she is feeling a bit better now.  I reviewed the past medical records in [place].  The patient was admitted in March  of this year with coffee-ground emesis due to esophageal varices and anemia.  She has a history of daily alcohol use.  On exam, her vital signs are normal.  She is slightly weak appearing but in no acute distress.  She has an ecchymosis to her left wrist but no other visible trauma.  Neurologic exam is nonfocal.  Mucous membranes are slightly dry.  She has no tremor or asterixis.  Overall I suspect most likely mechanical fall, however it is also possible that the patient had a near syncope or orthostatic episode.  Lab work-up obtained from triage shows hemoglobin of 10.5 which is actually good for the patient.  Her platelets are low, although this appears chronic and improved from her most recent labs here.  Troponin is negative, and CT of the head and cervical spine show no acute injury.  I think the patient will benefit from IV hydration.  I will order a liter of normal saline.  At this time there is no indication for admission.  She will need outpatient primary care, hematology, and GI follow-up.  ----------------------------------------- 4:59 AM on 08/19/2018 -----------------------------------------  The patient is feeling relatively well after the fluids, and is ambulating without difficulty.  I discussed with her the importance of follow-up with a hematologist due to her anemia and thrombocytopenia.  The patient already has follow-up scheduled with the gastroenterologist in 2 weeks.  I also gave referral information for the [facility] internal medicine clinic.  I counseled the patient extensively on the results of the work-up.  I gave her thorough return precautions and she expressed understanding.  She is stable for discharge at this time. ____________________________________________   FINAL CLINICAL IMPRESSION(S) / ED DIAGNOSES  Final diagnoses:  Fall, initial encounter  Minor head injury, initial encounter  Contusion of left wrist, initial encounter  Dehydration      NEW  MEDICATIONS STARTED DURING THIS VISIT:  New Prescriptions   No medications on file     Note:  This document was prepared using Dragon voice recognition software and may include unintentional dictation errors.    Arta Silence, MD 08/19/18 [phone]

## 2018-08-25 ENCOUNTER — Ambulatory Visit: Payer: BLUE CROSS/BLUE SHIELD | Admitting: Gastroenterology

## 2018-08-27 ENCOUNTER — Other Ambulatory Visit: Payer: Self-pay

## 2018-08-27 ENCOUNTER — Encounter: Payer: Self-pay | Admitting: Emergency Medicine

## 2018-08-27 ENCOUNTER — Inpatient Hospital Stay
Admission: EM | Admit: 2018-08-27 | Discharge: 2018-08-30 | DRG: 432 | Disposition: A | Discharge status: Home Health | Payer: BLUE CROSS/BLUE SHIELD | Attending: Internal Medicine | Admitting: Internal Medicine

## 2018-08-27 DIAGNOSIS — K7290 Hepatic failure, unspecified without coma: Secondary | ICD-10-CM | POA: Diagnosis present

## 2018-08-27 DIAGNOSIS — K7040 Alcoholic hepatic failure without coma: Principal | ICD-10-CM | POA: Diagnosis present

## 2018-08-27 DIAGNOSIS — D696 Thrombocytopenia, unspecified: Secondary | ICD-10-CM | POA: Diagnosis present

## 2018-08-27 DIAGNOSIS — E876 Hypokalemia: Secondary | ICD-10-CM | POA: Diagnosis present

## 2018-08-27 DIAGNOSIS — I8510 Secondary esophageal varices without bleeding: Secondary | ICD-10-CM | POA: Diagnosis present

## 2018-08-27 DIAGNOSIS — F10929 Alcohol use, unspecified with intoxication, unspecified: Secondary | ICD-10-CM

## 2018-08-27 DIAGNOSIS — K7030 Alcoholic cirrhosis of liver without ascites: Secondary | ICD-10-CM | POA: Diagnosis present

## 2018-08-27 DIAGNOSIS — Z1159 Encounter for screening for other viral diseases: Secondary | ICD-10-CM

## 2018-08-27 DIAGNOSIS — Z9181 History of falling: Secondary | ICD-10-CM

## 2018-08-27 DIAGNOSIS — K3189 Other diseases of stomach and duodenum: Secondary | ICD-10-CM | POA: Diagnosis present

## 2018-08-27 DIAGNOSIS — G4710 Hypersomnia, unspecified: Secondary | ICD-10-CM | POA: Diagnosis present

## 2018-08-27 DIAGNOSIS — K766 Portal hypertension: Secondary | ICD-10-CM | POA: Diagnosis present

## 2018-08-27 DIAGNOSIS — F10229 Alcohol dependence with intoxication, unspecified: Secondary | ICD-10-CM | POA: Diagnosis present

## 2018-08-27 DIAGNOSIS — R4182 Altered mental status, unspecified: Secondary | ICD-10-CM | POA: Diagnosis not present

## 2018-08-27 DIAGNOSIS — K7682 Hepatic encephalopathy: Secondary | ICD-10-CM

## 2018-08-27 DIAGNOSIS — Z87891 Personal history of nicotine dependence: Secondary | ICD-10-CM | POA: Diagnosis not present

## 2018-08-27 DIAGNOSIS — B9620 Unspecified Escherichia coli [E. coli] as the cause of diseases classified elsewhere: Secondary | ICD-10-CM | POA: Diagnosis present

## 2018-08-27 DIAGNOSIS — D649 Anemia, unspecified: Secondary | ICD-10-CM | POA: Diagnosis present

## 2018-08-27 DIAGNOSIS — G9341 Metabolic encephalopathy: Secondary | ICD-10-CM | POA: Diagnosis present

## 2018-08-27 DIAGNOSIS — N3000 Acute cystitis without hematuria: Secondary | ICD-10-CM | POA: Diagnosis present

## 2018-08-27 DIAGNOSIS — K7460 Unspecified cirrhosis of liver: Secondary | ICD-10-CM | POA: Diagnosis present

## 2018-08-27 DIAGNOSIS — R0600 Dyspnea, unspecified: Secondary | ICD-10-CM

## 2018-08-27 LAB — COMPREHENSIVE METABOLIC PANEL
ALT: 36 U/L (ref 0–44)
AST: 107 U/L — ABNORMAL HIGH (ref 15–41)
Albumin: 3.3 g/dL — ABNORMAL LOW (ref 3.5–5.0)
Alkaline Phosphatase: 97 U/L (ref 38–126)
Anion gap: 12 (ref 5–15)
BUN: 9 mg/dL (ref 6–20)
CO2: 24 mmol/L (ref 22–32)
Calcium: 8.7 mg/dL — ABNORMAL LOW (ref 8.9–10.3)
Chloride: 104 mmol/L (ref 98–111)
Creatinine, Ser: 0.49 mg/dL (ref 0.44–1.00)
GFR calc Af Amer: >60 mL/min (ref >60)
GFR calc non Af Amer: >60 mL/min (ref >60)
Glucose, Bld: 172 mg/dL — ABNORMAL HIGH (ref 70–99)
Potassium: 3.3 mmol/L — ABNORMAL LOW (ref 3.5–5.1)
Sodium: 140 mmol/L (ref 135–145)
Total Bilirubin: 6.2 mg/dL — ABNORMAL HIGH (ref 0.3–1.2)
Total Protein: 7.7 g/dL (ref 6.5–8.1)

## 2018-08-27 LAB — URINALYSIS, COMPLETE (UACMP) WITH MICROSCOPIC
Glucose, UA: NEGATIVE mg/dL
Ketones, ur: NEGATIVE mg/dL
Nitrite: NEGATIVE
Protein, ur: 100 mg/dL — AB
Specific Gravity, Urine: 1.025 (ref 1.005–1.030)
WBC, UA: >50 WBC/hpf — ABNORMAL HIGH (ref 0–5)
pH: 6 (ref 5.0–8.0)

## 2018-08-27 LAB — CBC WITH DIFFERENTIAL/PLATELET
Abs Immature Granulocytes: 0.06 10*3/uL (ref 0.00–0.07)
Basophils Absolute: 0.1 10*3/uL (ref 0.0–0.1)
Basophils Relative: 2 %
Eosinophils Absolute: 0.1 10*3/uL (ref 0.0–0.5)
Eosinophils Relative: 1 %
HCT: 29.3 % — ABNORMAL LOW (ref 36.0–46.0)
Hemoglobin: 9.6 g/dL — ABNORMAL LOW (ref 12.0–15.0)
Immature Granulocytes: 1 %
Lymphocytes Relative: 26 %
Lymphs Abs: 1.5 10*3/uL (ref 0.7–4.0)
MCH: 32.5 pg (ref 26.0–34.0)
MCHC: 32.8 g/dL (ref 30.0–36.0)
MCV: 99.3 fL (ref 80.0–100.0)
Monocytes Absolute: 0.6 10*3/uL (ref 0.1–1.0)
Monocytes Relative: 11 %
Neutro Abs: 3.3 10*3/uL (ref 1.7–7.7)
Neutrophils Relative %: 59 %
Platelets: 58 10*3/uL — ABNORMAL LOW (ref 150–400)
RBC: 2.95 MIL/uL — ABNORMAL LOW (ref 3.87–5.11)
RDW: 18.6 % — ABNORMAL HIGH (ref 11.5–15.5)
WBC: 5.6 10*3/uL (ref 4.0–10.5)
nRBC: 0 % (ref 0.0–0.2)

## 2018-08-27 LAB — ACETAMINOPHEN LEVEL: Acetaminophen (Tylenol), Serum: <10 ug/mL — ABNORMAL LOW (ref 10–30)

## 2018-08-27 LAB — TROPONIN I: Troponin I: <0.03 ng/mL (ref <0.03)

## 2018-08-27 LAB — ETHANOL: Alcohol, Ethyl (B): 446 mg/dL (ref <10)

## 2018-08-27 LAB — SALICYLATE LEVEL: Salicylate Lvl: <7 mg/dL (ref 2.8–30.0)

## 2018-08-27 LAB — AMMONIA: Ammonia: 102 umol/L — ABNORMAL HIGH (ref 9–35)

## 2018-08-27 NOTE — ED Provider Notes (Addendum)
[facility] Emergency Department Provider Note   First MD Initiated Contact with Patient 08/27/18 2324     (approximate)  I have reviewed the triage vital signs and the nursing notes.   HISTORY  Chief Complaint Weakness    HPI Lisa Dennis is a 53 y.o. female with below list of chronic conditions including alcohol abuse and cirrhosis of the liver presents to the emergency department via EMS.  EMS reports that the patient's parents travel to visit her from [place] and found the patient with generalized weakness feces and urine all over her place of dwelling.  Market odor of EtOH in the patient's house as well on the patient.  Very somnolent on arrival to the emergency department with multiple bruises of varying stages of healing.        Past Medical History:  Diagnosis Date  . Alcohol abuse   . Anxiety   . Cirrhosis [facility])     Patient Active Problem List   Diagnosis Date Noted  . GI bleeding 06/08/2018  . Alcoholic cirrhosis of liver without ascites ([place])   . Hematemesis without nausea     Past Surgical History:  Procedure Laterality Date  . ESOPHAGOGASTRODUODENOSCOPY (EGD) WITH PROPOFOL N/A 06/08/2018   Procedure: ESOPHAGOGASTRODUODENOSCOPY (EGD) WITH PROPOFOL;  Surgeon: Lucilla Lame, MD;  Location: [facility] ENDOSCOPY;  Service: Endoscopy;  Laterality: N/A;    Prior to Admission medications   Medication Sig Start Date End Date Taking? Authorizing Provider  pantoprazole (PROTONIX) 40 MG tablet Take 1 tablet (40 mg total) by mouth daily. Patient not taking: Reported on 08/27/2018 06/09/18   Bettey Costa, MD    Allergies Benadryl [diphenhydramine]; Morphine and related; Penicillins; Sulfa antibiotics; and Tetracyclines & related  History reviewed. No pertinent family history.  Social History Social History   Tobacco Use  . Smoking status: Former Research scientist (life sciences)  . Smokeless tobacco: Never Used  Substance Use Topics  . Alcohol use: Yes     Comment: heavy  . Drug use: Not on file    Review of Systems Constitutional: No fever/chills Eyes: No visual changes. ENT: No sore throat. Cardiovascular: Denies chest pain. Respiratory: Denies shortness of breath. Gastrointestinal: No abdominal pain.  No nausea, no vomiting.  No diarrhea.  No constipation. Genitourinary: Negative for dysuria. Musculoskeletal: Negative for neck pain.  Negative for back pain. Integumentary: Negative for rash. Neurological: Negative for headaches, focal weakness or numbness. Psychiatric:  Positive for alcohol abuse altered mental status.   ____________________________________________   PHYSICAL EXAM:  VITAL SIGNS: ED Triage Vitals  Enc Vitals Group     BP 08/27/18 2230 115/73     Pulse Rate 08/27/18 2230 (!) 112     Resp 08/27/18 2230 15     Temp 08/27/18 2230 98.2 F (36.8 C)     Temp Source 08/27/18 2230 Oral     SpO2 08/27/18 2230 (!) 89 %     Weight --      Height --      Head Circumference --      Peak Flow --      Pain Score 08/27/18 2319 0     Pain Loc --      Pain Edu? --      Excl. in [place]? --     Constitutional: Alert and confused.  Unkept with feces on the patient's body Eyes: Scleral icterus Head: Atraumatic. Mouth/Throat: Mucous membranes are moist.  Oropharynx non-erythematous. Neck: No stridor.  No meningeal signs.   Cardiovascular:  Tachycardia, regular rhythm. Good peripheral circulation. Grossly normal heart sounds. Respiratory: Normal respiratory effort.  No retractions. No audible wheezing. Gastrointestinal: Soft and nontender. No distention.  Musculoskeletal: No lower extremity tenderness nor edema. No gross deformities of extremities. Neurologic: Slurred speech and language. No gross focal neurologic deficits are appreciated.  Skin: Jaundice Psychiatric: Appears intoxicated  ____________________________________________   LABS (all labs ordered are listed, but only abnormal results are displayed)  Labs  Reviewed  CBC WITH DIFFERENTIAL/PLATELET - Abnormal; Notable for the following components:      Result Value   RBC 2.95 (*)    Hemoglobin 9.6 (*)    HCT 29.3 (*)    RDW 18.6 (*)    Platelets 58 (*)    All other components within normal limits  COMPREHENSIVE METABOLIC PANEL - Abnormal; Notable for the following components:   Potassium 3.3 (*)    Glucose, Bld 172 (*)    Calcium 8.7 (*)    Albumin 3.3 (*)    AST 107 (*)    Total Bilirubin 6.2 (*)    All other components within normal limits  AMMONIA - Abnormal; Notable for the following components:   Ammonia 102 (*)    All other components within normal limits  ETHANOL - Abnormal; Notable for the following components:   Alcohol, Ethyl (B) 446 (*)    All other components within normal limits  ACETAMINOPHEN LEVEL - Abnormal; Notable for the following components:   Acetaminophen (Tylenol), Serum <10 (*)    All other components within normal limits  TROPONIN I  SALICYLATE LEVEL  URINALYSIS, COMPLETE (UACMP) WITH MICROSCOPIC   ____________________________________________  EKG ED ECG REPORT I, [place] Fatou Dunnigan, the attending physician, personally viewed and interpreted this ECG.   Date: 08/27/2018  EKG Time: 10:31 PM  Rate: 116  Rhythm: Sinus tachycardia  Axis: Normal  intervals: Normal  ST&T Change: None    .Critical Care Performed by: Gregor Hams, MD Authorized by: Gregor Hams, MD   Critical care provider statement:    Critical care time (minutes):  30   Critical care time was exclusive of:  Separately billable procedures and treating other patients (Hepatic encephalopathy)   Critical care was necessary to treat or prevent imminent or life-threatening deterioration of the following conditions:  Hepatic failure   Critical care was time spent personally by me on the following activities:  Development of treatment plan with patient or surrogate, discussions with consultants, evaluation of patient's response to  treatment, examination of patient, obtaining history from patient or surrogate, ordering and performing treatments and interventions, ordering and review of laboratory studies, ordering and review of radiographic studies, pulse oximetry, re-evaluation of patient's condition and review of old charts     ____________________________________________   [place] / MDM / [place] / ED COURSE  As part of my medical decision making, I reviewed the following data within the [place]  24-year-old female presenting with above-stated history and physical exam concerning for alcohol intoxication and hepatic encephalopathy.  Suspicion confirmed as laboratory data revealed an alcohol level 446.  Ammonia level of 102.  Patient will be given lactulose in the emergency department [place] protocol initiated.  Patient discussed with Dr. Sidney Ace for hospital admission for further evaluation and management.  *Lisa Dennis was evaluated in Emergency Department on 08/27/2018 for the symptoms described in the history of present illness. She was evaluated in the context of the global COVID-19 pandemic, which necessitated consideration that the  patient might be at risk for infection with the SARS-CoV-2 virus that causes COVID-19. Institutional protocols and algorithms that pertain to the evaluation of patients at risk for COVID-19 are in a state of rapid change based on information released by regulatory bodies including the CDC and federal and state organizations. These policies and algorithms were followed during the patient's care in the ED.  Some ED evaluations and interventions may be delayed as a result of limited staffing during the pandemic.*       ____________________________________________  FINAL CLINICAL IMPRESSION(S) / ED DIAGNOSES  Final diagnoses:  Hepatic encephalopathy ([place])  Alcoholic intoxication with complication ([place])     MEDICATIONS GIVEN DURING  THIS VISIT:  Medications - No data to display   ED Discharge Orders    None       Note:  This document was prepared using Dragon voice recognition software and may include unintentional dictation errors.   Gregor Hams, MD 08/28/18 0020    Gregor Hams, MD 08/28/18 0020    Gregor Hams, MD 08/28/18 Benancio Deeds

## 2018-08-27 NOTE — ED Triage Notes (Signed)
Pt arrived via EMS from home where pts parents traveled from [place] to visit and found pt with increased weakness. Per EMS, urine found all over furniture where pt wasn't getting up to use bathroom. Pt unable to get up on own. Strong ETOH in house and on pt. Pt has hx/o daily ETOH, cirrhosis and multiple falls. Pt arrived to ED asleep. Pt has multiple bruises all over body. Per family, pt has recently gone through divorce and since has stopped performing ADLs for self.

## 2018-08-28 ENCOUNTER — Other Ambulatory Visit: Payer: Self-pay

## 2018-08-28 ENCOUNTER — Inpatient Hospital Stay: Payer: BLUE CROSS/BLUE SHIELD

## 2018-08-28 DIAGNOSIS — F10929 Alcohol use, unspecified with intoxication, unspecified: Secondary | ICD-10-CM

## 2018-08-28 DIAGNOSIS — K7290 Hepatic failure, unspecified without coma: Secondary | ICD-10-CM

## 2018-08-28 DIAGNOSIS — E876 Hypokalemia: Secondary | ICD-10-CM

## 2018-08-28 LAB — BASIC METABOLIC PANEL
Anion gap: 10 (ref 5–15)
BUN: 10 mg/dL (ref 6–20)
CO2: 27 mmol/L (ref 22–32)
Calcium: 8.6 mg/dL — ABNORMAL LOW (ref 8.9–10.3)
Chloride: 106 mmol/L (ref 98–111)
Creatinine, Ser: 0.44 mg/dL (ref 0.44–1.00)
GFR calc Af Amer: >60 mL/min (ref >60)
GFR calc non Af Amer: >60 mL/min (ref >60)
Glucose, Bld: 113 mg/dL — ABNORMAL HIGH (ref 70–99)
Potassium: 3.1 mmol/L — ABNORMAL LOW (ref 3.5–5.1)
Sodium: 143 mmol/L (ref 135–145)

## 2018-08-28 LAB — IRON AND TIBC
Iron: 67 ug/dL (ref 28–170)
Saturation Ratios: 23 % (ref 10.4–31.8)
TIBC: 292 ug/dL (ref 250–450)
UIBC: 226 ug/dL

## 2018-08-28 LAB — CBC
HCT: 28.4 % — ABNORMAL LOW (ref 36.0–46.0)
Hemoglobin: 9.2 g/dL — ABNORMAL LOW (ref 12.0–15.0)
MCH: 31.9 pg (ref 26.0–34.0)
MCHC: 32.4 g/dL (ref 30.0–36.0)
MCV: 98.6 fL (ref 80.0–100.0)
Platelets: 46 10*3/uL — ABNORMAL LOW (ref 150–400)
RBC: 2.88 MIL/uL — ABNORMAL LOW (ref 3.87–5.11)
RDW: 18.4 % — ABNORMAL HIGH (ref 11.5–15.5)
WBC: 4.8 10*3/uL (ref 4.0–10.5)
nRBC: 0 % (ref 0.0–0.2)

## 2018-08-28 LAB — OCCULT BLOOD X 1 CARD TO LAB, STOOL: Fecal Occult Bld: NEGATIVE

## 2018-08-28 LAB — CK: Total CK: 74 U/L (ref 38–234)

## 2018-08-28 LAB — SARS CORONAVIRUS 2 BY RT PCR (HOSPITAL ORDER, PERFORMED IN ~~LOC~~ HOSPITAL LAB): SARS Coronavirus 2: NEGATIVE

## 2018-08-28 LAB — TSH: TSH: 1.332 u[IU]/mL (ref 0.350–4.500)

## 2018-08-28 LAB — PROTIME-INR
INR: 1.7 — ABNORMAL HIGH (ref 0.8–1.2)
Prothrombin Time: 19.7 seconds — ABNORMAL HIGH (ref 11.4–15.2)

## 2018-08-28 LAB — MAGNESIUM: Magnesium: 1.6 mg/dL — ABNORMAL LOW (ref 1.7–2.4)

## 2018-08-28 LAB — FERRITIN: Ferritin: 25 ng/mL (ref 11–307)

## 2018-08-28 LAB — VITAMIN B12: Vitamin B-12: 933 pg/mL — ABNORMAL HIGH (ref 180–914)

## 2018-08-28 LAB — FOLATE: Folate: 7.8 ng/mL (ref >5.9)

## 2018-08-28 MED ORDER — LORAZEPAM 2 MG/ML IJ SOLN: 1.0000 mg | Freq: Four times a day (QID) | INTRAMUSCULAR | Status: DC | PRN

## 2018-08-28 MED ORDER — THIAMINE HCL 100 MG/ML IJ SOLN: 100.0000 mg | Freq: Every day | INTRAMUSCULAR | Status: DC

## 2018-08-28 MED ORDER — LACTULOSE 10 GM/15ML PO SOLN
30.0000 g | Freq: Once | ORAL | Status: AC
  Administered 2018-08-28: 30 g via ORAL
  Filled 2018-08-28: qty 60

## 2018-08-28 MED ORDER — VITAMIN B-1 100 MG PO TABS: 100.0000 mg | ORAL_TABLET | Freq: Every day | ORAL | Status: DC

## 2018-08-28 MED ORDER — ONDANSETRON HCL 4 MG/2ML IJ SOLN: 4.0000 mg | Freq: Four times a day (QID) | INTRAMUSCULAR | Status: DC | PRN

## 2018-08-28 MED ORDER — ONDANSETRON HCL 4 MG PO TABS: 4.0000 mg | ORAL_TABLET | Freq: Four times a day (QID) | ORAL | Status: DC | PRN

## 2018-08-28 MED ORDER — LORAZEPAM 2 MG PO TABS
0.0000 mg | ORAL_TABLET | Freq: Four times a day (QID) | ORAL | Status: AC
  Administered 2018-08-28 (×2): 1 mg via ORAL
  Filled 2018-08-28 (×2): qty 1

## 2018-08-28 MED ORDER — LACTULOSE 10 GM/15ML PO SOLN
30.0000 g | Freq: Three times a day (TID) | ORAL | Status: DC
  Administered 2018-08-28 – 2018-08-29 (×6): 30 g via ORAL
  Filled 2018-08-28 (×5): qty 60

## 2018-08-28 MED ORDER — VITAMIN B-1 100 MG PO TABS
100.0000 mg | ORAL_TABLET | Freq: Every day | ORAL | Status: DC
  Administered 2018-08-28 – 2018-08-30 (×3): 100 mg via ORAL
  Filled 2018-08-28 (×3): qty 1

## 2018-08-28 MED ORDER — SODIUM CHLORIDE 0.9 % IV SOLN
1.0000 g | INTRAVENOUS | Status: DC
  Administered 2018-08-28 – 2018-08-30 (×3): 1 g via INTRAVENOUS
  Filled 2018-08-28: qty 10
  Filled 2018-08-28: qty 1
  Filled 2018-08-28: qty 10
  Filled 2018-08-28: qty 1

## 2018-08-28 MED ORDER — LORAZEPAM 1 MG PO TABS: 1.0000 mg | ORAL_TABLET | Freq: Four times a day (QID) | ORAL | Status: DC | PRN

## 2018-08-28 MED ORDER — LORAZEPAM 2 MG/ML IJ SOLN: 0.0000 mg | Freq: Two times a day (BID) | INTRAMUSCULAR | Status: DC

## 2018-08-28 MED ORDER — POTASSIUM CHLORIDE 20 MEQ PO PACK
40.0000 meq | PACK | Freq: Once | ORAL | Status: AC
  Administered 2018-08-28: 40 meq via ORAL
  Filled 2018-08-28: qty 2

## 2018-08-28 MED ORDER — SODIUM CHLORIDE 0.9% FLUSH
3.0000 mL | Freq: Two times a day (BID) | INTRAVENOUS | Status: DC
  Administered 2018-08-28 – 2018-08-30 (×6): 3 mL via INTRAVENOUS

## 2018-08-28 MED ORDER — LORAZEPAM 2 MG/ML IJ SOLN
0.0000 mg | Freq: Four times a day (QID) | INTRAMUSCULAR | Status: AC
  Administered 2018-08-28: 1 mg via INTRAVENOUS
  Filled 2018-08-28: qty 1

## 2018-08-28 MED ORDER — MAGNESIUM SULFATE 2 GM/50ML IV SOLN
2.0000 g | Freq: Once | INTRAVENOUS | Status: AC
  Administered 2018-08-28: 2 g via INTRAVENOUS
  Filled 2018-08-28: qty 50

## 2018-08-28 MED ORDER — RIFAXIMIN 550 MG PO TABS
550.0000 mg | ORAL_TABLET | Freq: Two times a day (BID) | ORAL | Status: DC
  Administered 2018-08-28 – 2018-08-30 (×5): 550 mg via ORAL
  Filled 2018-08-28 (×5): qty 1

## 2018-08-28 MED ORDER — SODIUM CHLORIDE 0.9 % IV SOLN: 1.0000 g | INTRAVENOUS | Status: DC

## 2018-08-28 MED ORDER — ADULT MULTIVITAMIN W/MINERALS CH
1.0000 | ORAL_TABLET | Freq: Every day | ORAL | Status: DC
  Administered 2018-08-28 – 2018-08-30 (×3): 1 via ORAL
  Filled 2018-08-28 (×3): qty 1

## 2018-08-28 MED ORDER — LORAZEPAM 2 MG PO TABS
0.0000 mg | ORAL_TABLET | Freq: Two times a day (BID) | ORAL | Status: DC
  Administered 2018-08-30: 2 mg via ORAL
  Filled 2018-08-28: qty 1

## 2018-08-28 MED ORDER — PANTOPRAZOLE SODIUM 40 MG IV SOLR
40.0000 mg | Freq: Two times a day (BID) | INTRAVENOUS | Status: DC
  Administered 2018-08-28 – 2018-08-30 (×6): 40 mg via INTRAVENOUS
  Filled 2018-08-28 (×6): qty 40

## 2018-08-28 MED ORDER — FOLIC ACID 1 MG PO TABS
1.0000 mg | ORAL_TABLET | Freq: Every day | ORAL | Status: DC
  Administered 2018-08-28 – 2018-08-30 (×3): 1 mg via ORAL
  Filled 2018-08-28 (×3): qty 1

## 2018-08-28 MED ORDER — ADULT MULTIVITAMIN W/MINERALS CH: 1.0000 | ORAL_TABLET | Freq: Every day | ORAL | Status: DC

## 2018-08-28 MED ORDER — LACTULOSE 10 GM/15ML PO SOLN: 20.0000 g | Freq: Three times a day (TID) | ORAL | Status: DC

## 2018-08-28 MED ORDER — SODIUM CHLORIDE 0.9 % IV SOLN
INTRAVENOUS | Status: DC
  Administered 2018-08-28 (×2): via INTRAVENOUS

## 2018-08-28 MED ORDER — POLYETHYLENE GLYCOL 3350 17 G PO PACK: 17.0000 g | PACK | Freq: Every day | ORAL | Status: DC | PRN

## 2018-08-28 MED ORDER — FOLIC ACID 5 MG/ML IJ SOLN: 1.0000 mg | Freq: Every day | INTRAMUSCULAR | Status: DC

## 2018-08-28 NOTE — Evaluation (Signed)
Physical Therapy Evaluation Patient Details Name: Lisa Dennis MRN: [id number] DOB: 1965/08/16 Today's Date: 08/28/2018   History of Present Illness  Vaneta Hammontree is a 66yoF who [facility] found with AMS, soiled in feces and urine. Upon arrival ETOH: 446, NH4: 110. Pt admitted with hepatic encephalopathy. Pt recently moved to area from [place] several weeks ago, moved into her home [place], lives alone. Pt also found to have UTI and hypoKalemia.   Clinical Impression  Pt admitted with above diagnosis. Pt currently with functional limitations due to the deficits listed below (see "PT Problem List"). Upon entry, pt in bed, awake and agreeable to participate. The pt is alert and oriented x3, pleasant, conversational, and generally a good historian. Pt has flat affect, mild BUE tremor, is calm and somewhat drowsy. Pt is clearly weak, unable to get to EOB without assistance and unable to rise without assist, whereas she typically does not use an assistive device for AMB. Functional mobility assessment demonstrates increased effort/time requirements, poor tolerance, and need for physical assistance, whereas the patient performed these at a higher level of independence PTA. Pt will benefit from skilled PT intervention to increase independence and safety with basic mobility in preparation for discharge to the venue listed below.       Follow Up Recommendations Home health PT;Supervision for mobility/OOB    Equipment Recommendations  Rolling walker with 5" wheels    Recommendations for Other Services       Precautions / Restrictions Precautions Precautions: Fall Restrictions Weight Bearing Restrictions: No      Mobility  Bed Mobility Overal bed mobility: Needs Assistance Bed Mobility: Supine to Sit     Supine to sit: Mod assist     General bed mobility comments: quite weak, difficulty finishing the job  Transfers Overall transfer level: Needs  assistance Equipment used: 1 person hand held assist Transfers: Stand Pivot Transfers   Stand pivot transfers: Min assist       General transfer comment: unsteady, needs BUE for support/balance   Ambulation/Gait Ambulation/Gait assistance: (not appropriate at this time. )              Stairs            Wheelchair Mobility    Modified Rankin (Stroke Patients Only)       Balance Overall balance assessment: Needs assistance                                           Pertinent Vitals/Pain Pain Assessment: No/denies pain    Home Living Family/patient expects to be discharged to:: Private residence Living Arrangements: Alone   Type of Home: House Home Access: Stairs to enter Entrance Stairs-Rails: Right Entrance Stairs-Number of Steps: 10 Home Layout: One level Home Equipment: None      Prior Function Level of Independence: Independent         Comments: history of falls previously; reports relate dto siny marble floors     Hand Dominance   Dominant Hand: Right    Extremity/Trunk Assessment   Upper Extremity Assessment Upper Extremity Assessment: Generalized weakness    Lower Extremity Assessment Lower Extremity Assessment: Generalized weakness       Communication   Communication: No difficulties  Cognition Arousal/Alertness: Awake/alert Behavior During Therapy: WFL for tasks assessed/performed Overall Cognitive Status: Within Functional Limits for tasks assessed  General Comments      Exercises     Assessment/Plan    PT Assessment Patient needs continued PT services  PT Problem List Decreased strength;Decreased coordination;Decreased activity tolerance;Decreased balance;Decreased mobility       PT Treatment Interventions Therapeutic exercise;Gait training;Functional mobility training;Therapeutic activities;Patient/family education;Neuromuscular  re-education    PT Goals (Current goals can be found in the Care Plan section)  Acute Rehab PT Goals Patient Stated Goal: regain strength and mobility at home  PT Goal Formulation: With patient Time For Goal Achievement: 09/11/18 Potential to Achieve Goals: Good    Frequency Min 2X/week   Barriers to discharge Decreased caregiver support parents are in town from [place] to stay and help patient     Co-evaluation               AM-PAC PT "6 Clicks" Mobility  Outcome Measure Help needed turning from your back to your side while in a flat bed without using bedrails?: A Little Help needed moving from lying on your back to sitting on the side of a flat bed without using bedrails?: A Lot Help needed moving to and from a bed to a chair (including a wheelchair)?: A Little Help needed standing up from a chair using your arms (e.g., wheelchair or bedside chair)?: A Little Help needed to walk in hospital room?: A Lot Help needed climbing 3-5 steps with a railing? : A Lot 6 Click Score: 15    End of Session   Activity Tolerance: Patient tolerated treatment well;Patient limited by fatigue Patient left: in chair;with nursing/sitter in room;with call bell/phone within reach Nurse Communication: Mobility status PT Visit Diagnosis: Unsteadiness on feet (R26.81);Ataxic gait (R26.0);Other symptoms and signs involving the nervous system (R29.898);Muscle weakness (generalized) (M62.81);Other abnormalities of gait and mobility (R26.89);Difficulty in walking, not elsewhere classified (R26.2)    Time: 6269-4854 PT Time Calculation (min) (ACUTE ONLY): 22 min   Charges:   PT Evaluation $PT Eval Moderate Complexity: 1 Mod         1:37 PM, 08/28/18 Etta Grandchild, PT, DPT Physical Therapist - [facility]  [phone] ([place])   , C 08/28/2018, 1:33 PM

## 2018-08-28 NOTE — H&P (Signed)
[facility] NAME: Lisa Dennis    MR#:  [id number]  DATE OF BIRTH:  1965-05-19  DATE OF ADMISSION:  08/27/2018  PRIMARY CARE PHYSICIAN: Patient, No Pcp Per   REQUESTING/REFERRING PHYSICIAN: Marjean Donna, MD  CHIEF COMPLAINT:   Chief Complaint  Patient presents with  . Weakness    HISTORY OF PRESENT ILLNESS:  Lisa Dennis  is a 53 y.o. female with a known history of liver cirrhosis, alcohol dependence with daily alcohol consumption, and anxiety.  She reports having recently moved from [place] and has not obtained a primary care physician yet.  She tells me she does not take daily medications.  She presented to the emergency room via EMS services who were called by her parents.  Patient reports her parents came to visit her from [place] and discovered her with increased weakness and debility therefore calling EMS services.  According to EMS documentation, patient was found with generalized weakness and feces with urine both on the patient and throughout her living space.  Patient tells me she usually drinks 1 bottle of Chardonnay a day.  Her EtOH level is 446 on her arrival to the emergency room with ammonia 102.  She denies falls.  However there are multiple areas of ecchymosis on her upper and lower extremities as well as a large area of ecchymosis on her right hip/flank area.  During my conversation with her, patient seems somewhat hypersomnolent.  She denies noticing hematemesis, hematochezia, or melena.  She denies nausea or vomiting.  She denies diarrhea.  She denies abdominal pain.  However, she notes abdominal tenderness upon palpation.  Is increased shortness of breath or chest pain.  She denies fevers or chills.  Hemoglobin is 9.6 with hematocrit 29.3 on arrival.  Platelet count is 58,000.  AST is 107 with ALT 36.  Bilirubin is 6.2 and ammonia 102.  Potassium is 3.3.  No abnormal findings on EKG.  She denies  dysuria however has noted foul urine odor and dark urine.  Urinalysis demonstrates moderate leukocytes with bacteria and greater than 50 white blood cells.  Chest x-ray is pending.  Rapid COVID-19 testing is negative.  She has been admitted to the hospitalist service for further management.  PAST MEDICAL HISTORY:   Past Medical History:  Diagnosis Date  . Alcohol abuse   . Anxiety   . Cirrhosis ([place])     PAST SURGICAL HISTORY:   Past Surgical History:  Procedure Laterality Date  . ESOPHAGOGASTRODUODENOSCOPY (EGD) WITH PROPOFOL N/A 06/08/2018   Procedure: ESOPHAGOGASTRODUODENOSCOPY (EGD) WITH PROPOFOL;  Surgeon: Lucilla Lame, MD;  Location[facility] ENDOSCOPY;  Service: Endoscopy;  Laterality: N/A;    SOCIAL HISTORY:   Social History   Tobacco Use  . Smoking status: Former Research scientist (life sciences)  . Smokeless tobacco: Never Used  Substance Use Topics  . Alcohol use: Yes    Comment: heavy    FAMILY HISTORY:  History reviewed. No pertinent family history.  DRUG ALLERGIES:   Allergies  Allergen Reactions  . Benadryl [Diphenhydramine] Itching  . Morphine And Related Itching  . Penicillins   . Sulfa Antibiotics Itching  . Tetracyclines & Related Itching    REVIEW OF SYSTEMS:   Review of Systems  Constitutional: Positive for malaise/fatigue. Negative for chills, diaphoresis, fever and weight loss.  HENT: Negative for nosebleeds, sinus pain and sore throat.   Eyes: Negative for blurred vision, double vision and pain.  Respiratory: Negative for cough, hemoptysis, sputum  production and shortness of breath.   Cardiovascular: Negative for chest pain, palpitations, orthopnea and leg swelling.  Gastrointestinal: Negative for abdominal pain, blood in stool, constipation, diarrhea, heartburn, melena, nausea and vomiting.  Genitourinary: Positive for dysuria. Negative for flank pain, frequency, hematuria and urgency.  Musculoskeletal: Negative for falls (Patient denies fall) and myalgias.  Skin:  Negative for itching and rash.  Neurological: Positive for weakness. Negative for dizziness, tingling, tremors, seizures, loss of consciousness and headaches.  Psychiatric/Behavioral: Positive for substance abuse (1 bottle of Chardonnay a day).      MEDICATIONS AT HOME:   Prior to Admission medications   Medication Sig Start Date End Date Taking? Authorizing Provider  pantoprazole (PROTONIX) 40 MG tablet Take 1 tablet (40 mg total) by mouth daily. Patient not taking: Reported on 08/27/2018 06/09/18   Bettey Costa, MD      VITAL SIGNS:  Blood pressure 125/76, pulse 89, temperature 98.6 F (37 C), temperature source Oral, resp. rate 19, SpO2 94 %.  PHYSICAL EXAMINATION:  Physical Exam  GENERAL:  53 y.o.-year-old patient with unkept appearance lying in the bed with no acute distress.  EYES: Pupils equal, round, reactive to light and accommodation.  Bilateral scleral icterus. Extraocular muscles intact.  HEENT: Head atraumatic, normocephalic. Oropharynx and nasopharynx clear.  NECK:  Supple, no jugular venous distention. No thyroid enlargement, no tenderness.  LUNGS: Normal breath sounds bilaterally, no wheezing, rales,rhonchi or crepitation. No use of accessory muscles of respiration.  CARDIOVASCULAR: Regular rate and rhythm, S1, S2 normal. No murmurs, rubs, or gallops.  ABDOMEN: Soft, mildly distended, epigastric and right upper quadrant tenderness. Bowel sounds present. No  mass.  EXTREMITIES: No pedal edema, cyanosis, or clubbing.  NEUROLOGIC: Cranial nerves II through XII are intact. Muscle strength 5/5 in all extremities. Sensation intact. Gait not checked.  PSYCHIATRIC: The patient is alert and oriented x 3.  Normal affect and good eye contact. SKIN: Jaundiced with areas of ecchymosis in different stages of healing on her upper extremities and right hip  LABORATORY PANEL:   CBC Recent Labs  Lab 08/27/18 2234  WBC 5.6  HGB 9.6*  HCT 29.3*  PLT 58*    ------------------------------------------------------------------------------------------------------------------  Chemistries  Recent Labs  Lab 08/27/18 2234  NA 140  K 3.3*  CL 104  CO2 24  GLUCOSE 172*  BUN 9  CREATININE 0.49  CALCIUM 8.7*  AST 107*  ALT 36  ALKPHOS 97  BILITOT 6.2*   ------------------------------------------------------------------------------------------------------------------  Cardiac Enzymes Recent Labs  Lab 08/27/18 2234  TROPONINI <0.03   ------------------------------------------------------------------------------------------------------------------  RADIOLOGY:  No results found.    IMPRESSION AND PLAN:   1.  Hepatic encephalopathy - Secondary to alcohol abuse - With ammonia level 102.  Lactulose 30 mg 3 times daily -We will repeat ammonia level in the a.m. - Seizure precautions -Fall precautions - Repeat liver profile in the a.m. - Gastroenterology Dr. Allen Norris, consulted for further evaluation and recommendations  2.  EtOH abuse-dependence - CIWA protocol - Protonix twice daily  3.  Urinary tract infection - IV Rocephin 1 g daily - Urine culture results pending.  Will review and adjust therapy as indicated level  4.  Generalized weakness - Secondary to hepatic encephalopathy and liver cirrhosis - Physical therapy consulted for supportive care  5.  Anemia -With thrombocytopenia-platelet count 58,009 -We will continue to monitor closely.  Repeat CBC in the a.m.  Will get stool for occult blood  6.  Hypokalemia - Patient will have 40 mEq p.o. potassium replacement -We  will repeat BMP in the a.m. -Telemetry monitor  DVT prophylaxis with SCDs and TED hose as well as PPI prophylaxis have been initiated     All the records are reviewed and case discussed with ED provider. The plan of care was discussed in details with the patient (and family). I answered all questions. The patient agreed to proceed with the above  mentioned plan. Further management will depend upon hospital course.   CODE STATUS: Full code  TOTAL TIME TAKING CARE OF THIS PATIENT: 45 minutes.    [place] on 08/28/2018 at 1:42 AM  Pager - [phone]  After 6pm go to [email or web address] - Proofreader  [facility]  Office  [phone]  CC: Primary care physician; Patient, No Pcp Per   Note: This dictation was prepared with Dragon dictation along with smaller phrase technology. Any transcriptional errors that result from this process are unintentional.

## 2018-08-28 NOTE — Consult Note (Signed)
Cephas Darby, MD [address]  [place]  [place], [place] [address]  Main: [phone]  Fax: [phone] Pager: [phone]   Consultation  Referring Provider:     No ref. provider found Primary Care Physician:  Patient, No Pcp Per Primary Gastroenterologist:  Dr. Allen Norris         Reason for Consultation:     Hepatic encephalopathy  Date of Admission:  08/27/2018 Date of Consultation:  08/28/2018         HPI:   Lisa Dennis is a 53 y.o. female with history of heavy alcohol use, alcoholic cirrhosis, well compensated who presented to ER yesterday due to worsening generalized weakness, history of multiple falls as well as altered mental status.  Patient has gone through divorce recently and has been drinking alcohol regularly.  She is also found to have UTI for which she is currently being treated.  Patient is hemodynamically labile.  When I interviewed the patient, she was lethargic, jittery trying to eat her lunch.  She reported that she has been drinking alcohol heavily.  She denies abdominal pain, nausea or vomiting, hematemesis, melena, rectal bleeding   NSAIDs: None  Antiplts/Anticoagulants/Anti thrombotics: None  GI Procedures: EGD 06/08/2018 by Dr. Allen Norris - Medium-sized hiatal hernia. - Grade I esophageal varices. - Portal hypertensive gastropathy. - Erythematous duodenopathy. - No specimens collected.  Past Medical History:  Diagnosis Date  . Alcohol abuse   . Anxiety   . Cirrhosis [facility])     Past Surgical History:  Procedure Laterality Date  . ESOPHAGOGASTRODUODENOSCOPY (EGD) WITH PROPOFOL N/A 06/08/2018   Procedure: ESOPHAGOGASTRODUODENOSCOPY (EGD) WITH PROPOFOL;  Surgeon: Lucilla Lame, MD;  Location: [facility] ENDOSCOPY;  Service: Endoscopy;  Laterality: N/A;    Prior to Admission medications   Medication Sig Start Date End Date Taking? Authorizing Provider  pantoprazole (PROTONIX) 40 MG tablet Take 1 tablet (40 mg total) by mouth daily. Patient  not taking: Reported on 08/27/2018 06/09/18   Bettey Costa, MD    Current Facility-Administered Medications:  .  0.9 %  sodium chloride infusion, , Intravenous, Continuous, Seals, Theo Dills, NP, Last Rate: 75 mL/hr at 08/28/18 1659 .  cefTRIAXone (ROCEPHIN) 1 g in sodium chloride 0.9 % 100 mL IVPB, 1 g, Intravenous, Q24H, Seals, Angela H, NP, Last Rate: 200 mL/hr at 08/28/18 0300 .  folic acid (FOLVITE) tablet 1 mg, 1 mg, Oral, Daily, Seals, Angela H, NP, 1 mg at 08/28/18 1056 .  lactulose (CHRONULAC) 10 GM/15ML solution 30 g, 30 g, Oral, TID, Seals, Angela H, NP, 30 g at 08/28/18 1653 .  LORazepam (ATIVAN) injection 0-4 mg, 0-4 mg, Intravenous, Q6H, 1 mg at 08/28/18 1345 **OR** LORazepam (ATIVAN) tablet 0-4 mg, 0-4 mg, Oral, Q6H, Gregor Hams, MD, 1 mg at 08/28/18 0600 .  [START ON 08/30/2018] LORazepam (ATIVAN) injection 0-4 mg, 0-4 mg, Intravenous, Q12H **OR** [START ON 08/30/2018] LORazepam (ATIVAN) tablet 0-4 mg, 0-4 mg, Oral, Q12H, Gregor Hams, MD .  LORazepam (ATIVAN) tablet 1 mg, 1 mg, Oral, Q6H PRN **OR** LORazepam (ATIVAN) injection 1 mg, 1 mg, Intravenous, Q6H PRN, Seals, Angela H, NP .  magnesium sulfate IVPB 2 g 50 mL, 2 g, Intravenous, Once, Sainani, Belia Heman, MD .  multivitamin with minerals tablet 1 tablet, 1 tablet, Oral, Daily, Seals, Angela H, NP, 1 tablet at 08/28/18 1056 .  ondansetron (ZOFRAN) tablet 4 mg, 4 mg, Oral, Q6H PRN **OR** ondansetron (ZOFRAN) injection 4 mg, 4 mg, Intravenous, Q6H PRN, Seals,  Theo Dills, NP .  pantoprazole (PROTONIX) injection 40 mg, 40 mg, Intravenous, Q12H, Seals, Angela H, NP, 40 mg at 08/28/18 1056 .  polyethylene glycol (MIRALAX / GLYCOLAX) packet 17 g, 17 g, Oral, Daily PRN, Seals, Angela H, NP .  rifaximin (XIFAXAN) tablet 550 mg, 550 mg, Oral, BID, Derrion Tritz, Tally Due, MD, 550 mg at 08/28/18 1056 .  sodium chloride flush (NS) 0.9 % injection 3 mL, 3 mL, Intravenous, Q12H, Seals, Angela H, NP, 3 mL at 08/28/18 1057 .  thiamine (VITAMIN B-1)  tablet 100 mg, 100 mg, Oral, Daily, 100 mg at 08/28/18 1056 **OR** thiamine (B-1) injection 100 mg, 100 mg, Intravenous, Daily, Seals, Theo Dills, NP  History reviewed. No pertinent family history.   Social History   Tobacco Use  . Smoking status: Former Research scientist (life sciences)  . Smokeless tobacco: Never Used  Substance Use Topics  . Alcohol use: Yes    Comment: heavy  . Drug use: Not on file    Allergies as of 08/27/2018 - Review Complete 08/27/2018  Allergen Reaction Noted  . Benadryl [diphenhydramine] Itching 05/20/2018  . Morphine and related Itching 05/20/2018  . Penicillins  05/20/2018  . Sulfa antibiotics Itching 05/20/2018  . Tetracyclines & related Itching 05/20/2018    Review of Systems:    All systems reviewed and negative except where noted in HPI.   Physical Exam:  Vital signs in last 24 hours: Temp:  [97.7 F (36.5 C)-98.9 F (37.2 C)] 98.9 F (37.2 C) (06/05 1617) Pulse Rate:  [81-114] 92 (06/05 1617) Resp:  [10-25] 17 (06/05 1617) BP: (93-132)/(55-85) 123/72 (06/05 1617) SpO2:  [90 %-96 %] 94 % (06/05 1617) Weight:  [54.5 kg] 54.5 kg (06/05 0159) Last BM Date: 08/27/18 General: Ill-appearing, not well-groomed Head:  Normocephalic and atraumatic. Eyes:   No icterus.   Conjunctiva pink. PERRLA. Ears:  Normal auditory acuity. Neck:  Supple; no masses or thyroidomegaly Lungs: Respirations even and unlabored. Lungs clear to auscultation bilaterally.   No wheezes, crackles, or rhonchi.  Heart:  Regular rate and rhythm;  Without murmur, clicks, rubs or gallops Abdomen:  Soft, nondistended, nontender. Normal bowel sounds. No appreciable masses or hepatomegaly.  No rebound or guarding.  Rectal:  Not performed. Msk:  Symmetrical without gross deformities.  Strength generalized weakness Extremities:  Without edema, cyanosis or clubbing. Neurologic:  Alert and oriented x2;  grossly normal neurologically. Skin: Bruises in her upper extremities Cervical Nodes:  No significant  cervical adenopathy. Psych:  Alert and cooperative. Normal affect.  LAB RESULTS: CBC Latest Ref Rng & Units 08/28/2018 08/27/2018 08/18/2018  WBC 4.0 - 10.5 K/uL 4.8 5.6 4.2  Hemoglobin 12.0 - 15.0 g/dL 9.2(L) 9.6(L) 10.5(L)  Hematocrit 36.0 - 46.0 % 28.4(L) 29.3(L) 33.1(L)  Platelets 150 - 400 K/uL 46(L) 58(L) 49(L)    BMET BMP Latest Ref Rng & Units 08/28/2018 08/27/2018 08/18/2018  Glucose 70 - 99 mg/dL 113(H) 172(H) 120(H)  BUN 6 - 20 mg/dL '10 9 8  ' Creatinine 0.44 - 1.00 mg/dL 0.44 0.49 0.52  Sodium 135 - 145 mmol/L 143 140 140  Potassium 3.5 - 5.1 mmol/L 3.1(L) 3.3(L) 3.7  Chloride 98 - 111 mmol/L 106 104 105  CO2 22 - 32 mmol/L '27 24 26  ' Calcium 8.9 - 10.3 mg/dL 8.6(L) 8.7(L) 9.0    LFT Hepatic Function Latest Ref Rng & Units 08/27/2018 06/08/2018 06/08/2018  Total Protein 6.5 - 8.1 g/dL 7.7 6.8 8.0  Albumin 3.5 - 5.0 g/dL 3.3(L) 2.8(L) 3.4(L)  AST 15 - 41  U/L 107(H) 84(H) 130(H)  ALT 0 - 44 U/L 36 31 33  Alk Phosphatase 38 - 126 U/L 97 64 76  Total Bilirubin 0.3 - 1.2 mg/dL 6.2(H) 4.2(H) 4.9(H)     STUDIES: Dg Chest Port 1 View  Result Date: 08/28/2018 CLINICAL DATA:  53 year old female with shortness of breath. Negative for COVID-19 today. EXAM: PORTABLE CHEST 1 VIEW COMPARISON:  None. FINDINGS: Portable AP semi upright view at 0722 hours. Mildly low lung volumes. Mediastinal contours are normal allowing for portable technique. Visualized tracheal air column is within normal limits. Mild indistinct pulmonary opacity at both lung bases with no pneumothorax, pulmonary edema, pleural effusion or consolidation. Paucity bowel gas in the upper abdomen. No acute osseous abnormality identified. IMPRESSION: Low lung volumes with patchy bibasilar opacity favored to reflect atelectasis, although basilar infection is difficult to exclude. Electronically Signed   By: Genevie Ann M.D.   On: 08/28/2018 09:42      Impression / Plan:   Lisa Dennis is a 53 y.o. Caucasian female with alcohol  abuse, alcoholic cirrhosis admitted with hepatic encephalopathy  Hepatic encephalopathy Likely precipitated in setting of urinary tract infection Continue lactulose 30 g 3 times a day, hold for more than 3 loose BMs/day Start rifaximin mean 550 mg twice daily  Alcoholic cirrhosis/alcohol abuse Grade 1 esophageal varices She does not have ascites Abstinence from alcohol Encourage p.o. nutrition, multivitamin plus thiamine plus folate daily Ultrasound liver with Doppler in 05/2018 did not reveal focal liver lesions, AFP normal, patent portal veins  Follow-up with GI as outpatient  Thank you for involving me in the care of this patient.  Will follow along with you    LOS: 1 day   Sherri Sear, MD  08/28/2018, 5:01 PM   Note: This dictation was prepared with Dragon dictation along with smaller phrase technology. Any transcriptional errors that result from this process are unintentional.

## 2018-08-28 NOTE — H&P (Signed)
Attending physician admission note:  I have seen and examined the patient with Ms. Gardiner Barefoot, CRNP.  The patient presents with acute onset of alcohol intoxication and hepatic encephalopathy with ammonia level of 102 alcohol level of 446.  She has been having generalized weakness.  She was found by her parents in her feces and urine all over her place.  She was fairly somnolent upon arrival to the ER.  During our interview.  She was more alert.  Upon physical examination:  Generally: Pleasant middle-aged Caucasian female in no acute distress.  Skin is jaundiced. Cardiovascular: Regular rate and rhythm with normal S1-S2 and no murmurs gallops or rubs. Respiratory: Clear to auscultation bilaterally Abdomen: Soft, nontender, nondistended with positive bowel sounds and no palpable organomegaly or masses. Extremities: No edema clubbing or cyanosis  Labs and radiographic studies: were all reviewed  Assessment/plan: The patient will be admitted to a medical monitored bed for further management of hepatic encephalopathy with alcohol intoxication.  She was counseled for alcohol cessation.  She will be placed on CIWA protocol in addition to scheduled lactulose and will follow her ammonia level.  For further details please refer to dictated admission H&P.  I have discussed the case with my nurse practitioner. I agree with the admission note and the rest of the plan of care as delineated by Ms. Gardiner Barefoot, CRNP.

## 2018-08-28 NOTE — ED Notes (Signed)
In and out cath performed to obtain urine specimen due to pt incontinence of urine.

## 2018-08-28 NOTE — ED Notes (Signed)
ED TO INPATIENT HANDOFF REPORT  ED Nurse Name and Phone #: Antonieta Pert, RN [phone]  S Name/Age/Gender Lisa Dennis 53 y.o. female Room/Bed: ED12A/ED12A  Code Status   Code Status: Full Code  Home/SNF/Other Home Patient oriented to: self and place Is this baseline? Pt is a daily drinker in denial.   Triage Complete: Triage complete  Chief Complaint weakness ems  Triage Note Pt arrived via EMS from home where pts parents traveled from [place] to visit and found pt with increased weakness. Per EMS, urine found all over furniture where pt wasn't getting up to use bathroom. Pt unable to get up on own. Strong ETOH in house and on pt. Pt has hx/o daily ETOH, cirrhosis and multiple falls. Pt arrived to ED asleep. Pt has multiple bruises all over body. Per family, pt has recently gone through divorce and since has stopped performing ADLs for self.    Allergies Allergies  Allergen Reactions  . Benadryl [Diphenhydramine] Itching  . Morphine And Related Itching  . Penicillins   . Sulfa Antibiotics Itching  . Tetracyclines & Related Itching    Level of Care/Admitting Diagnosis ED Disposition    ED Disposition Condition [facility] Area: [place] [[id number]]  Level of Care: Med-Surg [16]  Covid Evaluation: Confirmed COVID Negative  Diagnosis: Hepatic encephalopathy ([place]) [572.2.ICD-9-CM]  Admitting Physician: Christel Mormon [[id number]]  Attending Physician: Christel Mormon [[id number]]  Estimated length of stay: past midnight tomorrow  Certification:: I certify this patient will need inpatient services for at least 2 midnights  PT Class (Do Not Modify): Inpatient [101]  PT Acc Code (Do Not Modify): Private [1]       B Medical/Surgery History Past Medical History:  Diagnosis Date  . Alcohol abuse   . Anxiety   . Cirrhosis [facility])    Past Surgical History:  Procedure Laterality Date  . ESOPHAGOGASTRODUODENOSCOPY (EGD) WITH  PROPOFOL N/A 06/08/2018   Procedure: ESOPHAGOGASTRODUODENOSCOPY (EGD) WITH PROPOFOL;  Surgeon: Lucilla Lame, MD;  Location[facility] ENDOSCOPY;  Service: Endoscopy;  Laterality: N/A;     A IV Location/Drains/Wounds Patient Lines/Drains/Airways Status   Active Line/Drains/Airways    Name:   Placement date:   Placement time:   Site:   Days:   Peripheral IV 08/27/18 Left Forearm   08/27/18    2239    Forearm   1          Intake/Output Last 24 hours No intake or output data in the 24 hours ending 08/28/18 0028  Labs/Imaging Results for orders placed or performed during the hospital encounter of 08/27/18 (from the past 48 hour(s))  Troponin I - ONCE - STAT     Status: None   Collection Time: 08/27/18 10:34 PM  Result Value Ref Range   Troponin I <0.03 <0.03 ng/mL    Comment: Performed at [facility], [address] [place], [place] [address]  CBC with Differential     Status: Abnormal   Collection Time: 08/27/18 10:34 PM  Result Value Ref Range   WBC 5.6 4.0 - 10.5 K/uL   RBC 2.95 (L) 3.87 - 5.11 MIL/uL   Hemoglobin 9.6 (L) 12.0 - 15.0 g/dL   HCT 29.3 (L) 36.0 - 46.0 %   MCV 99.3 80.0 - 100.0 fL   MCH 32.5 26.0 - 34.0 pg   MCHC 32.8 30.0 - 36.0 g/dL   RDW 18.6 (H) 11.5 - 15.5 %   Platelets 58 (L) 150 -  400 K/uL    Comment: PLATELET COUNT CONFIRMED BY SMEAR Immature Platelet Fraction may be clinically indicated, consider ordering this additional test [id number]    nRBC 0.0 0.0 - 0.2 %   Neutrophils Relative % 59 %   Neutro Abs 3.3 1.7 - 7.7 K/uL   Lymphocytes Relative 26 %   Lymphs Abs 1.5 0.7 - 4.0 K/uL   Monocytes Relative 11 %   Monocytes Absolute 0.6 0.1 - 1.0 K/uL   Eosinophils Relative 1 %   Eosinophils Absolute 0.1 0.0 - 0.5 K/uL   Basophils Relative 2 %   Basophils Absolute 0.1 0.0 - 0.1 K/uL   Immature Granulocytes 1 %   Abs Immature Granulocytes 0.06 0.00 - 0.07 K/uL    Comment: Performed at [facility], [address] [place], [place]  [address]  Comprehensive metabolic panel     Status: Abnormal   Collection Time: 08/27/18 10:34 PM  Result Value Ref Range   Sodium 140 135 - 145 mmol/L   Potassium 3.3 (L) 3.5 - 5.1 mmol/L   Chloride 104 98 - 111 mmol/L   CO2 24 22 - 32 mmol/L   Glucose, Bld 172 (H) 70 - 99 mg/dL   BUN 9 6 - 20 mg/dL   Creatinine, Ser 0.49 0.44 - 1.00 mg/dL   Calcium 8.7 (L) 8.9 - 10.3 mg/dL   Total Protein 7.7 6.5 - 8.1 g/dL   Albumin 3.3 (L) 3.5 - 5.0 g/dL   AST 107 (H) 15 - 41 U/L   ALT 36 0 - 44 U/L   Alkaline Phosphatase 97 38 - 126 U/L   Total Bilirubin 6.2 (H) 0.3 - 1.2 mg/dL   GFR calc non Af Amer >60 >60 mL/min   GFR calc Af Amer >60 >60 mL/min   Anion gap 12 5 - 15    Comment: Performed at [facility], [address] [place], [place] [address]  Ammonia     Status: Abnormal   Collection Time: 08/27/18 10:34 PM  Result Value Ref Range   Ammonia 102 (H) 9 - 35 umol/L    Comment: Performed at [facility], [address] [place], [place] [address]  Ethanol     Status: Abnormal   Collection Time: 08/27/18 10:34 PM  Result Value Ref Range   Alcohol, Ethyl (B) 446 (HH) <10 mg/dL    Comment: CRITICAL RESULT CALLED TO, READ BACK BY AND VERIFIED WITH ALICIA GRANGER ON 0/10/6759 AT 2304 QSD (NOTE) Lowest detectable limit for serum alcohol is 10 mg/dL. For medical purposes only. Performed at [facility], [address] [place], [place] [address]   Salicylate level     Status: None   Collection Time: 08/27/18 10:34 PM  Result Value Ref Range   Salicylate Lvl <2.6 2.8 - 30.0 mg/dL    Comment: Performed at [facility], [address] [place], [place] [address]  Acetaminophen level     Status: Abnormal   Collection Time: 08/27/18 10:34 PM  Result Value Ref Range   Acetaminophen (Tylenol), Serum <10 (L) 10 - 30 ug/mL    Comment: (NOTE) Therapeutic concentrations vary significantly. A range of 10-30 ug/mL  may be an effective concentration for many  patients. However, some  are best treated at concentrations outside of this range. Acetaminophen concentrations >150 ug/mL at 4 hours after ingestion  and >50 ug/mL at 12 hours after ingestion are often associated with  toxic reactions. Performed at [facility], [address],  [place], [place] [address]   Urinalysis, Complete w Microscopic     Status: Abnormal   Collection Time: 08/27/18 11:34 PM  Result Value Ref Range   Color, Urine AMBER (A) YELLOW    Comment: BIOCHEMICALS MAY BE AFFECTED BY COLOR   APPearance CLOUDY (A) CLEAR   Specific Gravity, Urine 1.025 1.005 - 1.030   pH 6.0 5.0 - 8.0   Glucose, UA NEGATIVE NEGATIVE mg/dL   Hgb urine dipstick SMALL (A) NEGATIVE   Bilirubin Urine SMALL (A) NEGATIVE   Ketones, ur NEGATIVE NEGATIVE mg/dL   Protein, ur 100 (A) NEGATIVE mg/dL   Nitrite NEGATIVE NEGATIVE   Leukocytes,Ua MODERATE (A) NEGATIVE   RBC / HPF 21-50 0 - 5 RBC/hpf   WBC, UA >50 (H) 0 - 5 WBC/hpf   Bacteria, UA RARE (A) NONE SEEN   Squamous Epithelial / LPF 0-5 0 - 5   WBC Clumps PRESENT    Mucus PRESENT    Non Squamous Epithelial PRESENT (A) NONE SEEN    Comment: Performed at [facility], [address] [place], [place] [address]   No results found.  Pending Labs [employer] (From admission, onward)    Start     Ordered   08/27/18 2358  SARS Coronavirus 2 (CEPHEID - Performed in [place] hospital lab), Hosp Order  (Asymptomatic Patients Labs)  ONCE - STAT,   STAT    Question:  Rule Out  Answer:  Yes   08/27/18 2357          Vitals/Pain Today's Vitals   08/27/18 2330 08/27/18 2345 08/28/18 0000 08/28/18 0015  BP: 132/85 126/85 112/70 111/67  Pulse: (!) 110 (!) 106 86 97  Resp: 10 14 14 14   Temp:      TempSrc:      SpO2: 96% 94% 94% 94%  PainSc:        Isolation Precautions No active isolations  Medications Medications  lactulose (CHRONULAC) 10 GM/15ML solution 30 g (has no administration in time range)  LORazepam  (ATIVAN) injection 0-4 mg (has no administration in time range)    Or  LORazepam (ATIVAN) tablet 0-4 mg (has no administration in time range)  LORazepam (ATIVAN) injection 0-4 mg (has no administration in time range)    Or  LORazepam (ATIVAN) tablet 0-4 mg (has no administration in time range)  thiamine (VITAMIN B-1) tablet 100 mg (has no administration in time range)    Or  thiamine (B-1) injection 100 mg (has no administration in time range)    Mobility Pt states she normally walks, however, upon parents arrival to her home, the home was covered with feces and urine. Pt also covered in feces, urine and states she has not had a shower in several weeks. Pt has evidence of multiple falls w/ significan bruising to R hip and bilateral arms and legs. Pt states she only drinks 4 glasses of wine a day, however given her ETOH, that may be an under-reporting of ETOH consumed daily.  High fall risk   Focused Assessments     R Recommendations: See Admitting Provider Note  Report given to:   Additional Notes:

## 2018-08-28 NOTE — Progress Notes (Signed)
[facility] [place] NAME: Lisa Dennis    MR#:  [id number]  DATE OF BIRTH:  21-Sep-1965  SUBJECTIVE:   Patient presented due to generalized weakness and being found down and noted to have altered mental status secondary to UTI and also hepatic encephalopathy.  Patient is more awake and alert today.  No other acute events overnight.  REVIEW OF SYSTEMS:    Review of Systems  Constitutional: Negative for chills and fever.  HENT: Negative for congestion and tinnitus.   Eyes: Negative for blurred vision and double vision.  Respiratory: Negative for cough, shortness of breath and wheezing.   Cardiovascular: Negative for chest pain, orthopnea and PND.  Gastrointestinal: Negative for abdominal pain, diarrhea, nausea and vomiting.  Genitourinary: Negative for dysuria and hematuria.  Neurological: Positive for weakness (generalized). Negative for dizziness, sensory change and focal weakness.  All other systems reviewed and are negative.   Nutrition: Carb modified Tolerating Diet: yes Tolerating PT: Await Eval.    DRUG ALLERGIES:   Allergies  Allergen Reactions   Benadryl [Diphenhydramine] Itching   Morphine And Related Itching   Penicillins    Sulfa Antibiotics Itching   Tetracyclines & Related Itching    VITALS:  Blood pressure 99/61, pulse (!) 108, temperature 98.6 F (37 C), temperature source Oral, resp. rate 16, height 5\' 1"  (1.549 m), weight 54.5 kg, SpO2 93 %.  PHYSICAL EXAMINATION:   Physical Exam  GENERAL:  53 y.o.-year-old patient lying in bed lethargic but in NAD.  EYES: Pupils equal, round, reactive to light and accommodation. No scleral icterus. Extraocular muscles intact.  HEENT: Head atraumatic, normocephalic. Oropharynx and nasopharynx clear.  Dry Oral Mucosa.  NECK:  Supple, no jugular venous distention. No thyroid enlargement, no tenderness.  LUNGS: Poor Resp. effort, no wheezing, rales, rhonchi.  No use of accessory muscles of respiration.  CARDIOVASCULAR: S1, S2 normal. No murmurs, rubs, or gallops.  ABDOMEN: Soft, nontender, nondistended. Bowel sounds present. No organomegaly or mass.  EXTREMITIES: No cyanosis, clubbing or edema b/l.    NEUROLOGIC: Cranial nerves II through XII are intact. No focal Motor or sensory deficits b/l. Globally weak.    PSYCHIATRIC: The patient is alert and oriented x 3.  SKIN: No obvious rash, lesion, or ulcer.    LABORATORY PANEL:   CBC Recent Labs  Lab 08/28/18 0311  WBC 4.8  HGB 9.2*  HCT 28.4*  PLT 46*   ------------------------------------------------------------------------------------------------------------------  Chemistries  Recent Labs  Lab 08/27/18 2234 08/28/18 0311  NA 140 143  K 3.3* 3.1*  CL 104 106  CO2 24 27  GLUCOSE 172* 113*  BUN 9 10  CREATININE 0.49 0.44  CALCIUM 8.7* 8.6*  MG  --  1.6*  AST 107*  --   ALT 36  --   ALKPHOS 97  --   BILITOT 6.2*  --    ------------------------------------------------------------------------------------------------------------------  Cardiac Enzymes Recent Labs  Lab 08/27/18 2234  TROPONINI <0.03   ------------------------------------------------------------------------------------------------------------------  RADIOLOGY:  Dg Chest Port 1 View  Result Date: 08/28/2018 CLINICAL DATA:  53 year old female with shortness of breath. Negative for COVID-19 today. EXAM: PORTABLE CHEST 1 VIEW COMPARISON:  None. FINDINGS: Portable AP semi upright view at 0722 hours. Mildly low lung volumes. Mediastinal contours are normal allowing for portable technique. Visualized tracheal air column is within normal limits. Mild indistinct pulmonary opacity at both lung bases with no pneumothorax, pulmonary edema, pleural effusion or consolidation. Paucity bowel gas in the upper  abdomen. No acute osseous abnormality identified. IMPRESSION: Low lung volumes with patchy bibasilar opacity favored to  reflect atelectasis, although basilar infection is difficult to exclude. Electronically Signed   By: Genevie Ann M.D.   On: 08/28/2018 09:42     ASSESSMENT AND PLAN:   53 year old female with past medical history of alcohol abuse, liver cirrhosis, anxiety who presented to the hospital as she was found unresponsive in her house covered in urine and feces and noted to have hepatic encephalopathy with a UTI.  1.  Altered mental status-metabolic encephalopathy secondary to underlying UTI and also hepatic encephalopathy. - Continue IV ceftriaxone for the UTI, lactulose for hepatic encephalopathy. -Follow mental status which is improving.  2.  Urinary tract infection-continue IV ceftriaxone, follow urine cultures.  3. Hepatic encephalopathy - cont. Lactulose, Xifaxan - follow ammonia level.   4. Hx of Liver Cirrhosis -due to ongoing alcohol abuse. -Continue lactulose for underlying encephalopathy. -Continue Protonix.  5.  Alcohol abuse- continue CIWA protocol. Continue thiamine, folate.  6.  Hypokalemia/hypomagnesemia-we will replace accordingly, repeat level in the morning.   All the records are reviewed and case discussed with Care Management/Social Worker. Management plans discussed with the patient, family and they are in agreement.  CODE STATUS: Full code  DVT Prophylaxis: Ted's & SCD's.   TOTAL TIME TAKING CARE OF THIS PATIENT: 30 minutes.   POSSIBLE D/C IN 2-3 DAYS, DEPENDING ON CLINICAL CONDITION.   Henreitta Leber M.D on 08/28/2018 at 3:47 PM  Between 7am to 6pm - Pager - [phone]  After 6pm go to [email or web address] - Proofreader  [facility]  Office  [phone]  CC: Primary care physician; Patient, No Pcp Per

## 2018-08-29 LAB — BASIC METABOLIC PANEL
Anion gap: 9 (ref 5–15)
BUN: 6 mg/dL (ref 6–20)
CO2: 23 mmol/L (ref 22–32)
Calcium: 8.4 mg/dL — ABNORMAL LOW (ref 8.9–10.3)
Chloride: 107 mmol/L (ref 98–111)
Creatinine, Ser: 0.44 mg/dL (ref 0.44–1.00)
GFR calc Af Amer: >60 mL/min (ref >60)
GFR calc non Af Amer: >60 mL/min (ref >60)
Glucose, Bld: 99 mg/dL (ref 70–99)
Potassium: 3.4 mmol/L — ABNORMAL LOW (ref 3.5–5.1)
Sodium: 139 mmol/L (ref 135–145)

## 2018-08-29 LAB — HEMOGLOBIN A1C
Hgb A1c MFr Bld: 3.7 % — ABNORMAL LOW (ref 4.8–5.6)
Mean Plasma Glucose: 59.49 mg/dL

## 2018-08-29 LAB — CBC
HCT: 26.1 % — ABNORMAL LOW (ref 36.0–46.0)
Hemoglobin: 8.4 g/dL — ABNORMAL LOW (ref 12.0–15.0)
MCH: 32.7 pg (ref 26.0–34.0)
MCHC: 32.2 g/dL (ref 30.0–36.0)
MCV: 101.6 fL — ABNORMAL HIGH (ref 80.0–100.0)
Platelets: 37 10*3/uL — ABNORMAL LOW (ref 150–400)
RBC: 2.57 MIL/uL — ABNORMAL LOW (ref 3.87–5.11)
RDW: 18.1 % — ABNORMAL HIGH (ref 11.5–15.5)
WBC: 3.8 10*3/uL — ABNORMAL LOW (ref 4.0–10.5)
nRBC: 0 % (ref 0.0–0.2)

## 2018-08-29 LAB — AMMONIA: Ammonia: 69 umol/L — ABNORMAL HIGH (ref 9–35)

## 2018-08-29 LAB — HIV ANTIBODY (ROUTINE TESTING W REFLEX): HIV Screen 4th Generation wRfx: NONREACTIVE

## 2018-08-29 NOTE — Evaluation (Signed)
Occupational Therapy Evaluation Patient Details Name: Lisa Dennis MRN: [id number] DOB: Jan 04, 1966 Today's Date: 08/29/2018    History of Present Illness Meeah Totino is a 51yoF who comes to [facility] on 6/4 found with AMS, soiled in feces and urine. Upon arrival ETOH: 446, NH4: 110. Pt admitted with hepatic encephalopathy. Pt recently moved to area from [place] several weeks ago, moved into her home [place], lives alone. Pt also found to have UTI and hypoKalemia.    Clinical Impression   Pt was seen for OT evaluation this date. Prior to hospital admission, pt was independent in all aspects of ADL/mobility. Endorses driving and community mobility.  Pt lives alone in a one level home with 10 steps to enter.  Currently pt demonstrates impairments in activity tolerance, balance, strength/endurance, as well as generalized weakness in BUE/BLE, requiring min - mod assist for dressing and bathing tasks. Pt declined to attempt using BSC, and requires mod/max assist for toileting with bedpan at bed level. Pt educated on importance of continued participation in self care skills and OOB activity as well as falls prevention strategies, safe use of AE for ADL, and strategies for maintaining regular sleep/wake cycles while int he hospital. Pt assisted with bed-level toileting on this date. Pt able to roll and bridge in bed independently. Req max assist for bedpan placement and peri-care. RN notified, pt encouraged to use BSC when appropriate. Pt would benefit from skilled OT to address noted impairments and functional limitations (see below for any additional details) in order to maximize safety and independence while minimizing falls risk and caregiver burden.  Upon hospital discharge, recommend HHOT to maximize safety and functional return to meaningful occupations of daily life.      Follow Up Recommendations  Home health OT;Supervision - Intermittent    Equipment Recommendations  3 in 1 bedside  commode;Tub/shower seat    Recommendations for Other Services       Precautions / Restrictions Precautions Precautions: Fall;Other (comment) Precaution Comments: siezure precautions Restrictions Weight Bearing Restrictions: No      Mobility Bed Mobility Overal bed mobility: Needs Assistance Bed Mobility: Supine to Sit;Sit to Supine     Supine to sit: Min assist Sit to supine: Min assist   General bed mobility comments: Min A provided for mgt of LE's/trunk stability. VCs for technique/hand placement.   Transfers Overall transfer level: Needs assistance Equipment used: Rolling walker (2 wheeled) Transfers: Sit to/from Stand Sit to Stand: Min assist              Balance Overall balance assessment: Needs assistance;History of Falls Sitting-balance support: Feet supported Sitting balance-Leahy Scale: Fair Sitting balance - Comments: Pt able to maintain static sitting balance with no UE support for brief periods. Unsteady reaching within BOS. Tremors limiting balance on this date.    Standing balance support: Bilateral upper extremity supported Standing balance-Leahy Scale: Poor Standing balance comment: Stood briefly with heavy UE support on RW and assist from therapist.                            ADL either performed or assessed with clinical judgement   ADL Overall ADL's : Needs assistance/impaired Eating/Feeding: Set up;Sitting   Grooming: Set up;Sitting   Upper Body Bathing: Minimal assistance;Sitting Upper Body Bathing Details (indicate cue type and reason): Min VCs for sequencing/safety.  Lower Body Bathing: Moderate assistance;Minimal assistance;Sit to/from stand   Upper Body Dressing : Sitting;Minimal assistance  Lower Body Dressing: Sit to/from stand;Minimal assistance;Moderate assistance   Toilet Transfer: Moderate assistance;RW;BSC;Stand-pivot Toilet Transfer Details (indicate cue type and reason): Pt declined to attempt to transfer to  [facility] during evaluation. Was able to stand using RW. Requested bedpan. Was able to complete rolling/bridge in bed for placement/removal. Likely req mod assist for transfer to [facility].  Toileting- Clothing Manipulation and Hygiene: Minimal assistance;Sit to/from stand         General ADL Comments: After x1 attempt at standing, pt laid back down in bed. Declined further functional mobility attempts at time of evaluation.      Vision Baseline Vision/History: Wears glasses Wears Glasses: At all times Patient Visual Report: No change from baseline       Perception     Praxis      Pertinent Vitals/Pain Pain Assessment: 0-10 Pain Score: 1  Pain Location: buttocks/low back  Pain Descriptors / Indicators: Grimacing;Sore Pain Intervention(s): Limited activity within patient's tolerance;Monitored during session;Utilized relaxation techniques;Repositioned     Hand Dominance Right   Extremity/Trunk Assessment Upper Extremity Assessment Upper Extremity Assessment: Generalized weakness   Lower Extremity Assessment Lower Extremity Assessment: Generalized weakness       Communication Communication Communication: No difficulties   Cognition Arousal/Alertness: Awake/alert Behavior During Therapy: WFL for tasks assessed/performed Overall Cognitive Status: Within Functional Limits for tasks assessed                                     General Comments       Exercises Other Exercises Other Exercises: Pt educated in falls prevention strategies, safe use of AE for ADLs/functional mobility, importance of maintaining regular sleep/wake cycles in the hospital and medication mgt strategies. Would benefit from further reinforcement on education provided.  Other Exercises: Pt assisted with use of bedpan on this date. Required asssit for placement and peri-care on this date. RN notified. Pt appears able to use BSC with assistance, however declined at time of eval.    Shoulder  Instructions      Home Living Family/patient expects to be discharged to:: Private residence Living Arrangements: Alone   Type of Home: House Home Access: Stairs to enter [employer] of Steps: 10 Entrance Stairs-Rails: Right Home Layout: One level     Bathroom Shower/Tub: Occupational psychologist: Standard Bathroom Accessibility: No(Not sure, doesn't think so. )   Home Equipment: Grab bars - tub/shower;Grab bars - toilet          Prior Functioning/Environment Level of Independence: Independent        Comments: Recently moved to [place] from [facility]; History of falls previously; reports relate dto siny marble floors in home in [place]. Reports no memory of falls in new home.         OT Problem List: Decreased strength;Impaired balance (sitting and/or standing);Decreased range of motion;Decreased safety awareness;Decreased activity tolerance;Decreased coordination;Decreased knowledge of use of DME or AE;Impaired UE functional use      OT Treatment/Interventions: Self-care/ADL training;Therapeutic exercise;Patient/family education;Therapeutic activities;Energy conservation;DME and/or AE instruction;Balance training;Cognitive remediation/compensation    OT Goals(Current goals can be found in the care plan section) Acute Rehab OT Goals Patient Stated Goal: regain strength and mobility at home  OT Goal Formulation: With patient Time For Goal Achievement: 09/12/18 Potential to Achieve Goals: Good ADL Goals Pt Will Perform Upper Body Dressing: with modified independence;with adaptive equipment;sitting(With LRAD for improved safety and functional independence.) Pt Will Perform Lower Body  Dressing: with modified independence;with adaptive equipment;sit to/from stand(With LRAD for improved safety and functional independence.) Additional ADL Goal #1: Pt will independently verbalize plan to implement at least 3 learned falls prevention strategies into her daily routines/home  environment for improved safety and independence during meaningful occupations of daily life upon hospital DC.  OT Frequency: Min 2X/week   Barriers to D/C: Inaccessible home environment;Decreased caregiver support          Co-evaluation              AM-PAC OT "6 Clicks" Daily Activity     Outcome Measure Help from another person eating meals?: None Help from another person taking care of personal grooming?: A Little Help from another person toileting, which includes using toliet, bedpan, or urinal?: A Little Help from another person bathing (including washing, rinsing, drying)?: A Little Help from another person to put on and taking off regular upper body clothing?: A Little Help from another person to put on and taking off regular lower body clothing?: A Little 6 Click Score: 19   End of Session Equipment Utilized During Treatment: Gait belt;Rolling walker Nurse Communication: Other (comment)(Pt used bedpan.)  Activity Tolerance: Patient limited by fatigue;Patient tolerated treatment well Patient left: in bed;with bed alarm set;with call bell/phone within reach;with SCD's reapplied  OT Visit Diagnosis: History of falling (Z91.81);Unsteadiness on feet (R26.81);Muscle weakness (generalized) (M62.81)                Time: 1610-9604 OT Time Calculation (min): 28 min Charges:  OT General Charges $OT Visit: 1 Visit OT Evaluation $OT Eval Moderate Complexity: 1 Mod OT Treatments $Self Care/Home Management : 8-22 mins  Shara Blazing, M.S., OTR/L Ascom: [phone] 08/29/18, 10:12 AM

## 2018-08-29 NOTE — Progress Notes (Signed)
Patient ID: Lisa Dennis, female   DOB: 02/27/1966, 53 y.o.   MRN: [id number]  [facility] PROGRESS NOTE  Lisa Dennis NWG:[id number] DOB: [birth year] DOA: 08/27/2018 PCP: Patient, No Pcp Per  HPI/Subjective: Patient feeling better.  States she needs to stop drinking.  Claims she only drinks four 4 ounce glasses of wine per day.  No abdominal pain.  No chest pain.  Objective: Vitals:   08/29/18 0021 08/29/18 0558  BP: 117/62 121/74  Pulse: 89 91  Resp: 19 19  Temp: 99.3 F (37.4 C) 99.4 F (37.4 C)  SpO2: 98% 95%    Filed Weights   08/28/18 0159  Weight: 54.5 kg    ROS: Review of Systems  Constitutional: Negative for chills and fever.  Eyes: Negative for blurred vision.  Respiratory: Negative for cough and shortness of breath.   Cardiovascular: Negative for chest pain.  Gastrointestinal: Negative for abdominal pain, constipation, diarrhea, nausea and vomiting.  Genitourinary: Negative for dysuria.  Musculoskeletal: Negative for joint pain.  Neurological: Negative for dizziness and headaches.   Exam: Physical Exam  HENT:  Nose: No mucosal edema.  Mouth/Throat: No oropharyngeal exudate or posterior oropharyngeal edema.  Eyes: Pupils are equal, round, and reactive to light. Conjunctivae, EOM and lids are normal.  Neck: No JVD present. Carotid bruit is not present. No edema present. No thyroid mass and no thyromegaly present.  Cardiovascular: S1 normal and S2 normal. Exam reveals no gallop.  No murmur heard. Pulses:      Dorsalis pedis pulses are 2+ on the right side and 2+ on the left side.  Respiratory: No respiratory distress. She has no wheezes. She has no rhonchi. She has no rales.  GI: Soft. Bowel sounds are normal. There is no abdominal tenderness.  Musculoskeletal:     Right ankle: She exhibits no swelling.     Left ankle: She exhibits no swelling.  Lymphadenopathy:    She has no cervical adenopathy.  Neurological: She is alert. No  cranial nerve deficit.  Positive tremor bilaterally  Skin: Skin is warm. No rash noted. Nails show no clubbing.  Psychiatric: She has a normal mood and affect.      Data Reviewed: Basic Metabolic Panel: Recent Labs  Lab 08/27/18 2234 08/28/18 0311 08/29/18 0539  NA 140 143 139  K 3.3* 3.1* 3.4*  CL 104 106 107  CO2 24 27 23   GLUCOSE 172* 113* 99  BUN 9 10 6   CREATININE 0.49 0.44 0.44  CALCIUM 8.7* 8.6* 8.4*  MG  --  1.6*  --    Liver Function Tests: Recent Labs  Lab 08/27/18 2234  AST 107*  ALT 36  ALKPHOS 97  BILITOT 6.2*  PROT 7.7  ALBUMIN 3.3*    Recent Labs  Lab 08/27/18 2234 08/29/18 0539  AMMONIA 102* 69*   CBC: Recent Labs  Lab 08/27/18 2234 08/28/18 0311 08/29/18 0539  WBC 5.6 4.8 3.8*  NEUTROABS 3.3  --   --   HGB 9.6* 9.2* 8.4*  HCT 29.3* 28.4* 26.1*  MCV 99.3 98.6 101.6*  PLT 58* 46* 37*   Cardiac Enzymes: Recent Labs  Lab 08/27/18 2234 08/28/18 0311  CKTOTAL  --  74  TROPONINI <0.03  --      Recent Results (from the past 240 hour(s))  SARS Coronavirus 2 (CEPHEID - Performed in [place] hospital lab), Hosp Order     Status: None   Collection Time: 08/28/18 12:05 AM  Result Value Ref Range Status  SARS Coronavirus 2 NEGATIVE NEGATIVE Final    Comment: (NOTE) If result is NEGATIVE SARS-CoV-2 target nucleic acids are NOT DETECTED. The SARS-CoV-2 RNA is generally detectable in upper and lower  respiratory specimens during the acute phase of infection. The lowest  concentration of SARS-CoV-2 viral copies this assay can detect is 250  copies / mL. A negative result does not preclude SARS-CoV-2 infection  and should not be used as the sole basis for treatment or other  patient management decisions.  A negative result may occur with  improper specimen collection / handling, submission of specimen other  than nasopharyngeal swab, presence of viral mutation(s) within the  areas targeted by this assay, and inadequate number of  viral copies  (<250 copies / mL). A negative result must be combined with clinical  observations, patient history, and epidemiological information. If result is POSITIVE SARS-CoV-2 target nucleic acids are DETECTED. The SARS-CoV-2 RNA is generally detectable in upper and lower  respiratory specimens dur ing the acute phase of infection.  Positive  results are indicative of active infection with SARS-CoV-2.  Clinical  correlation with patient history and other diagnostic information is  necessary to determine patient infection status.  Positive results do  not rule out bacterial infection or co-infection with other viruses. If result is PRESUMPTIVE POSTIVE SARS-CoV-2 nucleic acids MAY BE PRESENT.   A presumptive positive result was obtained on the submitted specimen  and confirmed on repeat testing.  While 2019 novel coronavirus  (SARS-CoV-2) nucleic acids may be present in the submitted sample  additional confirmatory testing may be necessary for epidemiological  and / or clinical management purposes  to differentiate between  SARS-CoV-2 and other Sarbecovirus currently known to infect humans.  If clinically indicated additional testing with an alternate test  methodology [phone]) is advised. The SARS-CoV-2 RNA is generally  detectable in upper and lower respiratory sp ecimens during the acute  phase of infection. The expected result is Negative. Fact Sheet for Patients:  StrictlyIdeas.no Fact Sheet for Healthcare Providers: BankingDealers.co.za This test is not yet approved or cleared by the [place] FDA and has been authorized for detection and/or diagnosis of SARS-CoV-2 by FDA under an Emergency Use Authorization (EUA).  This EUA will remain in effect (meaning this test can be used) for the duration of the COVID-19 declaration under Section 564(b)(1) of the Act, 21 U.S.C. section 360bbb-3(b)(1), unless the authorization is  terminated or revoked sooner. Performed at [facility], [address], [place], [place] [address]      Studies: Dg Chest Hemphill 1 View  Result Date: 08/28/2018 CLINICAL DATA:  53 year old female with shortness of breath. Negative for COVID-19 today. EXAM: PORTABLE CHEST 1 VIEW COMPARISON:  None. FINDINGS: Portable AP semi upright view at 0722 hours. Mildly low lung volumes. Mediastinal contours are normal allowing for portable technique. Visualized tracheal air column is within normal limits. Mild indistinct pulmonary opacity at both lung bases with no pneumothorax, pulmonary edema, pleural effusion or consolidation. Paucity bowel gas in the upper abdomen. No acute osseous abnormality identified. IMPRESSION: Low lung volumes with patchy bibasilar opacity favored to reflect atelectasis, although basilar infection is difficult to exclude. Electronically Signed   By: Genevie Ann M.D.   On: 08/28/2018 09:42    Scheduled Meds: . folic acid  1 mg Oral Daily  . lactulose  30 g Oral TID  . LORazepam  0-4 mg Intravenous Q6H   Or  . LORazepam  0-4 mg Oral Q6H  . [START  ON 08/30/2018] LORazepam  0-4 mg Intravenous Q12H   Or  . [START ON 08/30/2018] LORazepam  0-4 mg Oral Q12H  . multivitamin with minerals  1 tablet Oral Daily  . pantoprazole (PROTONIX) IV  40 mg Intravenous Q12H  . rifaximin  550 mg Oral BID  . sodium chloride flush  3 mL Intravenous Q12H  . thiamine  100 mg Oral Daily   Or  . thiamine  100 mg Intravenous Daily   Continuous Infusions: . cefTRIAXone (ROCEPHIN)  IV Stopped (08/29/18 0219)    Assessment/Plan:  1. Hepatic encephalopathy.  Continue lactulose and Xifaxan.  Ammonia trending better.  Continue to monitor 2. Acute cystitis.  On IV Rocephin.  Urine culture pending. 3. Alcohol abuse.  Continue alcohol withdrawal protocol.  Continue thiamine 4. Liver cirrhosis with thrombocytopenia.  Patient also has hepatic encephalopathy.  As per GI also has grade 1 esophageal  varices  Code Status:     Code Status Orders  (From admission, onward)         Start     Ordered   08/28/18 0042  Full code  Continuous     08/28/18 0049        Code Status History    Date Active Date Inactive Code Status Order ID Comments User Context   08/28/2018 0021 08/28/2018 0049 Full Code [id number]  Gregor Hams, MD ED   06/08/2018 0424 06/09/2018 1348 Full Code [id number]  Mansy, Arvella Merles, MD ED     Family Communication: Patient refused me to call family Disposition Plan: To be determined  Consultants:  Gastroenterology  Antibiotics: -Rocephin  Time spent: 27 minutes  Alicia

## 2018-08-30 LAB — URINE CULTURE
Culture: >=100000 — AB
Special Requests: NORMAL

## 2018-08-30 MED ORDER — THIAMINE HCL 100 MG PO TABS: 100.0000 mg | ORAL_TABLET | Freq: Every day | ORAL | 0 refills | Status: DC

## 2018-08-30 MED ORDER — LACTULOSE 10 GM/15ML PO SOLN: 20.0000 g | Freq: Two times a day (BID) | ORAL | Status: DC

## 2018-08-30 MED ORDER — PANTOPRAZOLE SODIUM 40 MG PO TBEC: 40.0000 mg | DELAYED_RELEASE_TABLET | Freq: Every day | ORAL | 0 refills | Status: DC

## 2018-08-30 MED ORDER — LACTULOSE 10 GM/15ML PO SOLN: ORAL | 0 refills | Status: DC

## 2018-08-30 MED ORDER — CEPHALEXIN 500 MG PO CAPS: 500.0000 mg | ORAL_CAPSULE | Freq: Three times a day (TID) | ORAL | 0 refills | Status: AC

## 2018-08-30 MED ORDER — RIFAXIMIN 550 MG PO TABS: 550.0000 mg | ORAL_TABLET | Freq: Two times a day (BID) | ORAL | 0 refills | Status: DC

## 2018-08-30 MED ORDER — FOLIC ACID 1 MG PO TABS: 1.0000 mg | ORAL_TABLET | Freq: Every day | ORAL | 0 refills | Status: DC

## 2018-08-30 NOTE — Discharge Summary (Signed)
[facility] NAME: Lisa Dennis    MR#:  [id number]  DATE OF BIRTH:  January 02, 1966  DATE OF ADMISSION:  08/27/2018 ADMITTING PHYSICIAN: Christel Mormon, MD  DATE OF DISCHARGE: 08/30/2018  9:55 AM  PRIMARY CARE PHYSICIAN: Patient, No Pcp Per    ADMISSION DIAGNOSIS:  Hepatic encephalopathy ([place]) [W09.81] Alcoholic intoxication with complication ([place]) [X91.478]  DISCHARGE DIAGNOSIS:  Active Problems:   Hepatic encephalopathy ([place])   SECONDARY DIAGNOSIS:   Past Medical History:  Diagnosis Date  . Alcohol abuse   . Anxiety   . Cirrhosis ([place])     HOSPITAL COURSE:   1.  Hepatic encephalopathy.  The patient was prescribed lactulose and Xifaxan.  Ammonia trended better.  Mental status has improved.  Patient had 6 bowel movements overnight.  Will be able to cut back lactulose to twice a day dosing.  I told the patient her goal bowel movements is 3 bowel movements a day.  Did get care manager to see if we can get a Xifaxan coupon. 2.  Acute cystitis.  Patient on IV Rocephin here.  Prescribed Keflex upon going home.  Urine culture did grow out pansensitive E. Coli. 3.  Alcohol abuse.  As per nursing staff the patient did not receive any Ativan.  Patient always has a slight tremor.  Continue thiamine.  I advised her that she needs to stop alcohol or else she will be back in the hospital.  She is slowly killing herself with the alcohol. 4.  Liver cirrhosis with thrombocytopenia.  Patient also has hepatic encephalopathy.  As per GI on previous admission has grade 1 esophageal varices.  Patient must stop drinking for 1 year in order to be a candidate for liver transplant.  Cirrhosis does not reverse but sometimes the platelet count can get better with stopping alcohol.  DISCHARGE CONDITIONS:   Fair  CONSULTS OBTAINED:  Treatment Team:  Lin Landsman, MD  DRUG ALLERGIES:   Allergies  Allergen Reactions  . Benadryl  [Diphenhydramine] Itching  . Morphine And Related Itching  . Penicillins   . Sulfa Antibiotics Itching  . Tetracyclines & Related Itching    DISCHARGE MEDICATIONS:   Allergies as of 08/30/2018      Reactions   Benadryl [diphenhydramine] Itching   Morphine And Related Itching   Penicillins    Sulfa Antibiotics Itching   Tetracyclines & Related Itching      Medication List    TAKE these medications   cephALEXin 500 MG capsule Commonly known as:  KEFLEX Take 1 capsule (500 mg total) by mouth 3 (three) times daily for 3 days.   folic acid 1 MG tablet Commonly known as:  FOLVITE Take 1 tablet (1 mg total) by mouth daily.   lactulose 10 GM/15ML solution Commonly known as:  CHRONULAC 20gm oral twice a day (goal is 3 bowel movements a day, can go to three or four times a day dosing if needed)   pantoprazole 40 MG tablet Commonly known as:  Protonix Take 1 tablet (40 mg total) by mouth daily.   rifaximin 550 MG Tabs tablet Commonly known as:  XIFAXAN Take 1 tablet (550 mg total) by mouth 2 (two) times daily.   thiamine 100 MG tablet Take 1 tablet (100 mg total) by mouth daily.        DISCHARGE INSTRUCTIONS:   Given number for Dr. Clayborn Bigness to set up appointment.  If you experience worsening of your admission  symptoms, develop shortness of breath, life threatening emergency, suicidal or homicidal thoughts you must seek medical attention immediately by calling 911 or calling your MD immediately  if symptoms less severe.  You Must read complete instructions/literature along with all the possible adverse reactions/side effects for all the Medicines you take and that have been prescribed to you. Take any new Medicines after you have completely understood and accept all the possible adverse reactions/side effects.   Please note  You were cared for by a hospitalist during your hospital stay. If you have any questions about your discharge medications or the care you received  while you were in the hospital after you are discharged, you can call the unit and asked to speak with the hospitalist on call if the hospitalist that took care of you is not available. Once you are discharged, your primary care physician will handle any further medical issues. Please note that NO REFILLS for any discharge medications will be authorized once you are discharged, as it is imperative that you return to your primary care physician (or establish a relationship with a primary care physician if you do not have one) for your aftercare needs so that they can reassess your need for medications and monitor your lab values.    Today   CHIEF COMPLAINT:   Chief Complaint  Patient presents with  . Weakness    HISTORY OF PRESENT ILLNESS:  Lisa Dennis  is a 53 y.o. female came in with weakness and found to have elevated ammonia level.   VITAL SIGNS:  Blood pressure 126/84, pulse 96, temperature 98.8 F (37.1 C), temperature source Oral, resp. rate 16, height 5\' 1"  (1.549 m), weight 54.5 kg, SpO2 99 %.   PHYSICAL EXAMINATION:  GENERAL:  53 y.o.-year-old patient lying in the bed with no acute distress.  EYES: Pupils equal, round, reactive to light and accommodation. No scleral icterus. Extraocular muscles intact.  HEENT: Head atraumatic, normocephalic. Oropharynx and nasopharynx clear.  NECK:  Supple, no jugular venous distention. No thyroid enlargement, no tenderness.  LUNGS: Normal breath sounds bilaterally, no wheezing, rales,rhonchi or crepitation. No use of accessory muscles of respiration.  CARDIOVASCULAR: S1, S2 normal. No murmurs, rubs, or gallops.  ABDOMEN: Soft, non-tender, non-distended. Bowel sounds present. No organomegaly or mass.  EXTREMITIES: No pedal edema, cyanosis, or clubbing.  NEUROLOGIC: Cranial nerves II through XII are intact. Muscle strength 5/5 in all extremities. Sensation intact. Gait not checked.  Slight tremor PSYCHIATRIC: The patient is alert and  oriented x 3.  SKIN: No obvious rash, lesion, or ulcer.   DATA REVIEW:   CBC Recent Labs  Lab 08/29/18 0539  WBC 3.8*  HGB 8.4*  HCT 26.1*  PLT 37*    Chemistries  Recent Labs  Lab 08/27/18 2234 08/28/18 0311 08/29/18 0539  NA 140 143 139  K 3.3* 3.1* 3.4*  CL 104 106 107  CO2 24 27 23   GLUCOSE 172* 113* 99  BUN 9 10 6   CREATININE 0.49 0.44 0.44  CALCIUM 8.7* 8.6* 8.4*  MG  --  1.6*  --   AST 107*  --   --   ALT 36  --   --   ALKPHOS 97  --   --   BILITOT 6.2*  --   --     Cardiac Enzymes Recent Labs  Lab 08/27/18 2234  TROPONINI <0.03    Microbiology Results  Results for orders placed or performed during the hospital encounter of 08/27/18  Urine Culture  Status: Abnormal   Collection Time: 08/27/18 11:34 PM  Result Value Ref Range Status   Specimen Description   Final    URINE, CATHETERIZED Performed at [facility], [address] [place], [place] [address]    Special Requests   Final    Normal Performed at [facility], [place], [place] [address]    Culture >=100,000 COLONIES/mL ESCHERICHIA COLI (A)  Final   Report Status 08/30/2018 FINAL  Final   Organism ID, Bacteria ESCHERICHIA COLI (A)  Final      Susceptibility   Escherichia coli - MIC*    AMPICILLIN <=2 SENSITIVE Sensitive     CEFAZOLIN <=4 SENSITIVE Sensitive     CEFTRIAXONE <=1 SENSITIVE Sensitive     CIPROFLOXACIN <=0.25 SENSITIVE Sensitive     GENTAMICIN <=1 SENSITIVE Sensitive     IMIPENEM <=0.25 SENSITIVE Sensitive     NITROFURANTOIN <=16 SENSITIVE Sensitive     TRIMETH/SULFA <=20 SENSITIVE Sensitive     AMPICILLIN/SULBACTAM <=2 SENSITIVE Sensitive     PIP/TAZO <=4 SENSITIVE Sensitive     Extended ESBL NEGATIVE Sensitive     * >=100,000 COLONIES/mL ESCHERICHIA COLI  SARS Coronavirus 2 (CEPHEID - Performed in [place] hospital lab), Hosp Order     Status: None   Collection Time: 08/28/18 12:05 AM  Result Value Ref Range Status   SARS  Coronavirus 2 NEGATIVE NEGATIVE Final    Comment: (NOTE) If result is NEGATIVE SARS-CoV-2 target nucleic acids are NOT DETECTED. The SARS-CoV-2 RNA is generally detectable in upper and lower  respiratory specimens during the acute phase of infection. The lowest  concentration of SARS-CoV-2 viral copies this assay can detect is 250  copies / mL. A negative result does not preclude SARS-CoV-2 infection  and should not be used as the sole basis for treatment or other  patient management decisions.  A negative result may occur with  improper specimen collection / handling, submission of specimen other  than nasopharyngeal swab, presence of viral mutation(s) within the  areas targeted by this assay, and inadequate number of viral copies  (<250 copies / mL). A negative result must be combined with clinical  observations, patient history, and epidemiological information. If result is POSITIVE SARS-CoV-2 target nucleic acids are DETECTED. The SARS-CoV-2 RNA is generally detectable in upper and lower  respiratory specimens dur ing the acute phase of infection.  Positive  results are indicative of active infection with SARS-CoV-2.  Clinical  correlation with patient history and other diagnostic information is  necessary to determine patient infection status.  Positive results do  not rule out bacterial infection or co-infection with other viruses. If result is PRESUMPTIVE POSTIVE SARS-CoV-2 nucleic acids MAY BE PRESENT.   A presumptive positive result was obtained on the submitted specimen  and confirmed on repeat testing.  While 2019 novel coronavirus  (SARS-CoV-2) nucleic acids may be present in the submitted sample  additional confirmatory testing may be necessary for epidemiological  and / or clinical management purposes  to differentiate between  SARS-CoV-2 and other Sarbecovirus currently known to infect humans.  If clinically indicated additional testing with an alternate test   methodology [phone]) is advised. The SARS-CoV-2 RNA is generally  detectable in upper and lower respiratory sp ecimens during the acute  phase of infection. The expected result is Negative. Fact Sheet for Patients:  StrictlyIdeas.no Fact Sheet for Healthcare Providers: BankingDealers.co.za This test is not yet approved or cleared by the [place] FDA and has been  authorized for detection and/or diagnosis of SARS-CoV-2 by FDA under an Emergency Use Authorization (EUA).  This EUA will remain in effect (meaning this test can be used) for the duration of the COVID-19 declaration under Section 564(b)(1) of the Act, 21 U.S.C. section 360bbb-3(b)(1), unless the authorization is terminated or revoked sooner. Performed at [facility], [address] [place], [place] [address]       Management plans discussed with the patient, and she is in agreement.  She refused for me to call any family members.  CODE STATUS:  Code Status History    Date Active Date Inactive Code Status Order ID Comments User Context   08/28/2018 0049 08/30/2018 1313 Full Code [id number]  Mayer Camel, NP ED   08/28/2018 0021 08/28/2018 0049 Full Code [id number]  Gregor Hams, MD ED   06/08/2018 0424 06/09/2018 1348 Full Code [id number]  Mansy, Arvella Merles, MD ED      TOTAL TIME TAKING CARE OF THIS PATIENT: 35 minutes.    Loletha Grayer M.D on 08/30/2018 at 3:06 PM  Between 7am to 6pm - Pager - [phone]  After 6pm go to [email or web address] - password [email or web address] [facility]  [facility] Office  [phone]  CC: Primary care physician; Patient, No Pcp Per

## 2018-08-30 NOTE — Progress Notes (Signed)
Discharge instructions given to pt. IV removed. Belongings returned from security. No questions at this time.Pt dressed and awaiting ride home with friend.

## 2018-08-30 NOTE — Discharge Instructions (Signed)
Alcohol Abuse and Nutrition  Alcohol abuse is any pattern of alcohol consumption that harms your health, relationships, or work. Alcohol abuse can cause poor nutrition (malnutrition or malnourishment) and a lack of nutrients (nutrient deficiencies), which can lead to more complications. Alcohol abuse brings malnutrition and nutrient deficiencies in two ways:   It causes your liver to work abnormally. This affects how your body divides (breaks down) and absorbs nutrients from food.   It causes you to eat poorly. Many people who abuse alcohol do not eat enough carbohydrates, protein, fat, vitamins, and minerals.  Nutrients that are commonly lacking (deficient) in people who abuse alcohol include:   Vitamins.  ? Vitamin A. This is needed for your vision, metabolism, and ability to fight off infections (immunity).  ? B vitamins. These include folate, thiamine, and niacin. These are needed for new cell growth.  ? Vitamin C. This plays an important role in wound healing, immunity, and helping your body to absorb iron.  ? Vitamin D. This is necessary for your body to absorb and use calcium. It is produced by your liver, but you can also get it from food and from sun exposure.   Minerals.  ? Calcium. This is needed for healthy bones as well as heart and blood vessel (cardiovascular) function.  ? Iron. This is important for blood, muscle, and nervous system functioning.  ? Magnesium. This plays an important role in muscle and nerve function, and it helps to control blood sugar and blood pressure.  ? Zinc. This is important for the normal functioning of your nervous system and digestive system (gastrointestinal tract).  If you think that you have an alcohol dependency problem, or if it is hard to stop drinking because you feel sick or different when you do not use alcohol, talk with your health care provider or another health professional about where to get help.  Nutrition is an essential factor in therapy for alcohol  abuse. Your health care provider or diet and nutrition specialist (dietitian) will work with you to design a plan that can help to restore nutrients to your body and prevent the risk of complications.  What is my plan?  Your dietitian may develop a specific eating plan that is based on your condition and any other problems that you have. An eating plan will commonly include:   A balanced diet.  ? Grains: 6-8 oz (170-227 g) a day. Examples of 1 oz of whole grains include 1 cup of whole-wheat cereal,  cup of brown rice, or 1 slice of whole-wheat bread.  ? Vegetables: 2-3 cups a day. Examples of 1 cup of vegetables include 2 medium carrots, 1 large tomato, or 2 stalks of celery.  ? Fruits: 1-2 cups a day. Examples of 1 cup of fruit include 1 large banana, 1 small apple, 8 large strawberries, or 1 large orange.  ? Meat and other protein: 5-6 oz (142-170 g) a day.   A cut of meat or fish that is the size of a deck of cards is about 3-4 oz.   Foods that provide 1 oz of protein include 1 egg,  cup of nuts or seeds, or 1 tablespoon (16 g) of peanut butter.  ? Dairy: 2-3 cups a day. Examples of 1 cup of dairy include 8 oz (230 mL) of milk, 8 oz (230 g) of yogurt, or 1 oz (44 g) of natural cheese.   Vitamin and mineral supplements.  What are tips for following this plan?     Eat frequent meals and snacks. Try to eat 5-6 small meals each day.   Take vitamin or mineral supplements as recommended by your dietitian.   If you are malnourished or if your dietitian recommends it:  ? You may follow a high-protein, high-calorie diet. This may include:   2,000-3,000 calories (kilocalories) a day.   70-100 g (grams) of protein a day.  ? You may be directed to follow a diet that includes a complete nutritional supplement beverage. This can help to restore calories, protein, and vitamins to your body. Depending on your condition, you may be advised to consume this beverage instead of your meals or in addition to them.   Certain  medicines may cause changes in your appetite, taste, and weight. Work with your health care provider and dietitian to make any changes to your medicines and eating plan.   If you are unable to take in enough food and calories by mouth, your health care provider may recommend a feeding tube. This tube delivers nutritional supplements directly to your stomach.  Recommended foods   Eat foods that are high in molecules that prevent oxygen from reacting with your food (antioxidants). These foods include grapes, berries, nuts, green tea, and dark green or orange vegetables. Eating these can help to prevent some of the stress that is placed on your liver by consuming alcohol.   Eat a variety of fresh fruits and vegetables each day. This will help you to get fiber and vitamins in your diet.   Drink plenty of water and other clear fluids, such as apple juice and broth. Try to drink at least 48-64 oz (1.5-2 L) of water a day.   Include foods fortified with vitamins and minerals in your diet. Commonly fortified foods include milk, orange juice, cereal, and bread.   Eat a variety of foods that are high in omega-3 and omega-6 fatty acids. These include fish, nuts and seeds, and soybeans. These foods may help your liver to recover and may also stabilize your mood.   If you are a vegetarian:  ? Eat a variety of protein-rich foods.  ? Pair whole grains with plant-based proteins at meals and snack time. For example, eat rice with beans, put peanut butter on whole-grain toast, or eat oatmeal with sunflower seeds.  The items listed above may not be a complete list of foods and beverages you can eat. Contact a dietitian for more information.  Foods to avoid   Avoid foods and drinks that are high in fat and sugar. Sugary drinks, salty snacks, and candy contain empty calories. This means that they lack important nutrients such as protein, fiber, and vitamins.   Avoid alcohol. This is the best way to avoid malnutrition due to  alcohol abuse. If you must drink, drink measured amounts. Measured drinking means limiting your intake to no more than 1 drink a day for nonpregnant women and 2 drinks a day for men. One drink equals 12 oz (355 mL) of beer, 5 oz (148 mL) of wine, or 1 oz (44 mL) of hard liquor.   Limit your intake of caffeine. Replace drinks like coffee and black tea with decaffeinated coffee and decaffeinated herbal tea.  The items listed above may not be a complete list of foods and beverages you should avoid. Contact a dietitian for more information.  Summary   Alcohol abuse can cause poor nutrition (malnutrition or malnourishment) and a lack of nutrients (nutrient deficiencies), which can lead to more health problems.     Common nutrient deficiencies include vitamin deficiencies (A, B, C, and D) and mineral deficiencies (calcium, iron, magnesium, and zinc).   Nutrition is an essential factor in therapy for alcohol abuse.   Your health care provider and dietitian can help you to develop a specific eating plan that includes a balanced diet plus vitamin and mineral supplements.  This information is not intended to replace advice given to you by your health care provider. Make sure you discuss any questions you have with your health care provider.  Document Released: 01/03/2005 Document Revised: 11/12/2017 Document Reviewed: 11/26/2016  Elsevier Interactive Patient Education  2019 [employer]

## 2018-08-30 NOTE — TOC Transition Note (Signed)
Transition of Care [facility]) - CM/SW Discharge Note   Patient Details  Name: Lisa Dennis MRN: [id number] Date of Birth: 1965-07-12  Transition of [facility]) CM/SW Contact:  Latanya Maudlin, RN Phone Number: 08/30/2018, 9:37 AM   Clinical Narrative:  Patient to be discharged per MD order. Orders in place for home health services. CMS Medicare.gov Compare Post Acute Care list reviewed with patient and she thinks she is familiar with [facility] and [facility]. Patient recently moved here and her mother is coming down from [place] to help with her care. Patients main preference is an agency with the lowest co pay. I have talked to several agencies and ultimately its is unknown what the final co pay will be, if there is one until these agencies run it through intake. Referral placed with Helene Kelp at [facility] who will be able to determine a price at a later time and have the agency contact [name] with a definitive price. Patient agreeable to this process and unwilling to wait in the hospital until we know of the ultimate payment requirement. Patient needs no DME. Patient discharging on Xifaxan which is known to be expensive. Patient typically uses [facility] and according to them co pay is usually between $40-$70 depending on drug coverage plan. Patient understands this and agrees that she may try [facility] if [facility] co pay is too expensive. [place] also could not give me a definite price but depending on coverage co pay ranges from $30 to full price if there is no drug coverage.      Final next level of care: [place] Barriers to Discharge: No Barriers Identified   Patient Goals and CMS Choice Patient states their goals for this hospitalization and ongoing recovery are:: to get settled in here CMS Medicare.gov Compare Post Acute Care list provided to:: Patient Choice offered to / list presented to : Patient  Discharge Placement                       Discharge  Plan and Services                          HH Arranged: RN, PT, Nurse's Aide [place] Agency: [facility] (formerly [employer]) Date [place]: 08/30/18 Time [place]: [phone] Representative spoke with at [place]: teresa  Social Determinants of Health ([place]) Interventions     Readmission Risk Interventions Readmission Risk Prevention Plan 08/30/2018  Transportation Screening Complete  PCP or Specialist Appt within 5-7 Days Not Complete  Not Complete comments unable to make pcp appt on a Sunday  Home Care Screening Complete  Medication Review (RN CM) Complete

## 2018-09-01 ENCOUNTER — Inpatient Hospital Stay: Payer: BLUE CROSS/BLUE SHIELD | Admitting: Oncology

## 2018-09-03 LAB — VITAMIN B1: Vitamin B1 (Thiamine): 67.8 nmol/L (ref 66.5–200.0)

## 2018-09-04 ENCOUNTER — Inpatient Hospital Stay: Payer: BLUE CROSS/BLUE SHIELD | Admitting: Oncology

## 2018-09-06 NOTE — Progress Notes (Signed)
Primary Care Physician: Patient, No Pcp Per  Primary Gastroenterologist:  Dr. Lucilla Lame  Chief Complaint  Patient presents with  . Elevated Hepatic Enzymes    HPI: Lisa Dennis is a 53 y.o. female here for follow-up after being discharged from the hospital.  The patient was seen by me in March for her cirrhosis and alcohol abuse.  At that time the patient stated that she had moved from [place] and had a history of heavy drinking but was ready to stop drinking and wanted to go through detox.  The patient was then admitted 3 months later with melena and continued alcohol abuse with cirrhosis.  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  Her recent admission on June 5 was due to hepatic encephalopathy.  Her alcohol level at that time was 446. The patient now reports that she has not had anything to drink for the last 2 weeks.  She also states that she is feeling much better.  The patient stopped her lactulose because she was taking it as prescribed and started to get diarrhea.  Current Outpatient Medications  Medication Sig Dispense Refill  . folic acid (FOLVITE) 1 MG tablet Take 1 tablet (1 mg total) by mouth daily. 30 tablet 0  . lactulose (CHRONULAC) 10 GM/15ML solution 20gm oral twice a day (goal is 3 bowel movements a day, can go to three or four times a day dosing if needed) 1892 mL 0  . pantoprazole (PROTONIX) 40 MG tablet Take 1 tablet (40 mg total) by mouth daily. 30 tablet 0  . rifaximin (XIFAXAN) 550 MG TABS tablet Take 1 tablet (550 mg total) by mouth 2 (two) times daily. 60 tablet 0  . thiamine 100 MG tablet Take 1 tablet (100 mg total) by mouth daily. 30 tablet 0   No current facility-administered medications for this visit.     Allergies as of 09/07/2018 - Review Complete 09/07/2018  Allergen Reaction Noted  . Benadryl [diphenhydramine] Itching 05/20/2018  . Morphine and related  Itching 05/20/2018  . Penicillins  05/20/2018  . Sulfa antibiotics Itching 05/20/2018  . Tetracyclines & related Itching 05/20/2018    ROS:  General: Negative for anorexia, weight loss, fever, chills, fatigue, weakness. ENT: Negative for hoarseness, difficulty swallowing , nasal congestion. CV: Negative for chest pain, angina, palpitations, dyspnea on exertion, peripheral edema.  Respiratory: Negative for dyspnea at rest, dyspnea on exertion, cough, sputum, wheezing.  GI: See history of present illness. GU:  Negative for dysuria, hematuria, urinary incontinence, urinary frequency, nocturnal urination.  Endo: Negative for unusual weight change.    Physical Examination:   BP 107/63   Pulse 76   Temp 99 F (37.2 C) (Oral)   Ht 5\' 2"  (1.575 m)   Wt 119 lb (54 kg)   BMI 21.77 kg/m   General: Well-nourished, well-developed in no acute distress.  Eyes: No icterus. Conjunctivae pink. Mouth: Oropharyngeal mucosa moist and pink , no lesions erythema or exudate. Lungs: Clear to auscultation bilaterally. Non-labored. Heart: Regular rate and rhythm, no murmurs rubs or gallops.  Abdomen: Bowel sounds are normal, nontender, nondistended, no hepatosplenomegaly or masses, no abdominal bruits or hernia , no rebound or guarding.   Extremities: No lower extremity edema. No clubbing or deformities. Neuro: Alert and oriented x 3.  Grossly intact. Skin: Warm and dry, no jaundice.   Psych: Alert and cooperative, normal mood and affect.  Labs:  Imaging Studies: Ct Head Wo Contrast  Result Date: 08/18/2018 CLINICAL DATA:  Recent fall from bed with headaches and neck pain, initial encounter EXAM: CT HEAD WITHOUT CONTRAST CT CERVICAL SPINE WITHOUT CONTRAST TECHNIQUE: Multidetector CT imaging of the head and cervical spine was performed following the standard protocol without intravenous contrast. Multiplanar CT image reconstructions of the cervical spine were also generated. COMPARISON:  None.  FINDINGS: CT HEAD FINDINGS Brain: Mild atrophic and chronic white matter ischemic changes are noted. No findings to suggest acute hemorrhage, acute infarction or space-occupying mass lesion are seen. Vascular: No hyperdense vessel or unexpected calcification. Skull: Normal. Negative for fracture or focal lesion. Sinuses/Orbits: No acute finding. Other: None. CT CERVICAL SPINE FINDINGS Alignment: Within normal limits. Skull base and vertebrae: 7 cervical segments are well visualized. Vertebral body height is well maintained. No acute fracture or acute facet abnormality is noted. Mild facet hypertrophic changes are noted particularly on the right. Soft tissues and spinal canal: Surrounding soft tissue structures are within normal limits. Upper chest: Visualized lung apices show no acute abnormality. Other: None IMPRESSION: CT of the head: No acute intracranial abnormality noted. Chronic atrophic and ischemic changes are again noted. CT of cervical spine: Mild degenerative changes without acute abnormality. Electronically Signed   By: Inez Catalina M.D.   On: 08/18/2018 23:20   Ct Cervical Spine Wo Contrast  Result Date: 08/18/2018 CLINICAL DATA:  Recent fall from bed with headaches and neck pain, initial encounter EXAM: CT HEAD WITHOUT CONTRAST CT CERVICAL SPINE WITHOUT CONTRAST TECHNIQUE: Multidetector CT imaging of the head and cervical spine was performed following the standard protocol without intravenous contrast. Multiplanar CT image reconstructions of the cervical spine were also generated. COMPARISON:  None. FINDINGS: CT HEAD FINDINGS Brain: Mild atrophic and chronic white matter ischemic changes are noted. No findings to suggest acute hemorrhage, acute infarction or space-occupying mass lesion are seen. Vascular: No hyperdense vessel or unexpected calcification. Skull: Normal. Negative for fracture or focal lesion. Sinuses/Orbits: No acute finding. Other: None. CT CERVICAL SPINE FINDINGS Alignment: Within  normal limits. Skull base and vertebrae: 7 cervical segments are well visualized. Vertebral body height is well maintained. No acute fracture or acute facet abnormality is noted. Mild facet hypertrophic changes are noted particularly on the right. Soft tissues and spinal canal: Surrounding soft tissue structures are within normal limits. Upper chest: Visualized lung apices show no acute abnormality. Other: None IMPRESSION: CT of the head: No acute intracranial abnormality noted. Chronic atrophic and ischemic changes are again noted. CT of cervical spine: Mild degenerative changes without acute abnormality. Electronically Signed   By: Inez Catalina M.D.   On: 08/18/2018 23:20   Dg Chest Port 1 View  Result Date: 08/28/2018 CLINICAL DATA:  53 year old female with shortness of breath. Negative for COVID-19 today. EXAM: PORTABLE CHEST 1 VIEW COMPARISON:  None. FINDINGS: Portable AP semi upright view at 0722 hours. Mildly low lung volumes. Mediastinal contours are normal allowing for portable technique. Visualized tracheal air column is within normal limits. Mild indistinct pulmonary opacity at both lung bases with no pneumothorax, pulmonary edema, pleural effusion or consolidation. Paucity bowel gas in the upper abdomen. No acute osseous abnormality identified. IMPRESSION: Low lung volumes with patchy bibasilar opacity favored to reflect atelectasis, although basilar infection is difficult to exclude. Electronically Signed   By: Genevie Ann M.D.   On: 08/28/2018 09:42    Assessment and Plan:   Lisa Dennis is a 53 y.o. y/o female who was recently discharged  from the hospital and is now here for follow-up.  The patient has been found to have esophageal varices with hypertensive portal gastropathy and cirrhosis.  The patient will have her blood work sent off for alpha-fetoprotein in addition to other possible causes of her cirrhosis.  She will also be evaluated for her immunity to hepatitis A and B to see if  she needs vaccinations.  The patient has been told that she will need to follow-up with me every 6 months due to her cirrhosis and to check alpha-fetoprotein.  The patient reports that her last colonoscopy was 2 years ago when she was told that she does not need another colonoscopy for 5 years since her last one.  The patient has been told that her liver can improve with stopping alcohol but may never get back to normal.  The patient has been explained the plan and agrees with it.   Lucilla Lame, MD. [facility]   Note: This dictation was prepared with Dragon dictation along with smaller phrase technology. Any transcriptional errors that result from this process are unintentional.

## 2018-09-07 ENCOUNTER — Ambulatory Visit (INDEPENDENT_AMBULATORY_CARE_PROVIDER_SITE_OTHER): Payer: BLUE CROSS/BLUE SHIELD | Admitting: Gastroenterology

## 2018-09-07 ENCOUNTER — Other Ambulatory Visit: Payer: Self-pay

## 2018-09-07 ENCOUNTER — Encounter: Payer: Self-pay | Admitting: Gastroenterology

## 2018-09-07 VITALS — BP 107/63 | HR 76 | Temp 99.0°F | Ht 62.0 in | Wt 119.0 lb

## 2018-09-07 DIAGNOSIS — R748 Abnormal levels of other serum enzymes: Secondary | ICD-10-CM

## 2018-09-07 DIAGNOSIS — K7030 Alcoholic cirrhosis of liver without ascites: Secondary | ICD-10-CM | POA: Diagnosis not present

## 2018-09-09 LAB — HEPATIC FUNCTION PANEL
ALT: 32 IU/L (ref 0–32)
AST: 63 IU/L — ABNORMAL HIGH (ref 0–40)
Albumin: 3.8 g/dL (ref 3.8–4.9)
Alkaline Phosphatase: 98 IU/L (ref 39–117)
Bilirubin Total: 6 mg/dL — ABNORMAL HIGH (ref 0.0–1.2)
Bilirubin, Direct: 2.68 mg/dL — ABNORMAL HIGH (ref 0.00–0.40)
Total Protein: 7.8 g/dL (ref 6.0–8.5)

## 2018-09-09 LAB — AFP TUMOR MARKER: AFP, Serum, Tumor Marker: 12.2 ng/mL — ABNORMAL HIGH (ref 0.0–8.3)

## 2018-09-09 LAB — MITOCHONDRIAL ANTIBODIES: Mitochondrial Ab: <20 Units (ref 0.0–20.0)

## 2018-09-09 LAB — HEPATITIS B SURFACE ANTIBODY,QUALITATIVE: Hep B Surface Ab, Qual: NONREACTIVE

## 2018-09-09 LAB — ALPHA-1-ANTITRYPSIN: A-1 Antitrypsin: 169 mg/dL (ref 101–187)

## 2018-09-09 LAB — HEPATITIS A ANTIBODY, TOTAL: hep A Total Ab: POSITIVE — AB

## 2018-09-09 LAB — PROTIME-INR
INR: 1.3 — ABNORMAL HIGH (ref 0.8–1.2)
Prothrombin Time: 14.1 s — ABNORMAL HIGH (ref 9.1–12.0)

## 2018-09-09 LAB — CERULOPLASMIN: Ceruloplasmin: 16.8 mg/dL — ABNORMAL LOW (ref 19.0–39.0)

## 2018-09-09 LAB — ANTI-SMOOTH MUSCLE ANTIBODY, IGG: Smooth Muscle Ab: 8 Units (ref 0–19)

## 2018-09-09 LAB — ANA: Anti Nuclear Antibody (ANA): POSITIVE — AB

## 2018-09-22 ENCOUNTER — Telehealth: Payer: Self-pay

## 2018-09-22 ENCOUNTER — Other Ambulatory Visit: Payer: Self-pay

## 2018-09-22 DIAGNOSIS — K760 Fatty (change of) liver, not elsewhere classified: Secondary | ICD-10-CM

## 2018-09-22 NOTE — Telephone Encounter (Signed)
Pt scheduled for an MRI Liver at [facility] on Wednesday, July 15th at 9:00am. Pt advised [facility] mall registration desk at 8:30am. She has also been advised she cannot have anything to eat or drink 4 hours prior to exam.

## 2018-09-22 NOTE — Telephone Encounter (Signed)
-----   Message from Lucilla Lame, MD sent at 09/13/2018 12:42 PM EDT ----- The patient needs a Hep B vaccine and an MRI due to increased tumor marker. Then office followup after the MRI.

## 2018-10-05 ENCOUNTER — Telehealth: Payer: Self-pay | Admitting: Gastroenterology

## 2018-10-05 NOTE — Telephone Encounter (Signed)
Melanie Calling to ask for the precert on pt for her MRI of the liver with & w/o contrast on 10-07-18.

## 2018-10-05 NOTE — Telephone Encounter (Signed)
MRI rescheduled to 10/21/18.

## 2018-10-07 ENCOUNTER — Ambulatory Visit: Payer: BLUE CROSS/BLUE SHIELD

## 2018-10-18 ENCOUNTER — Other Ambulatory Visit: Payer: Self-pay

## 2018-10-18 DIAGNOSIS — N189 Chronic kidney disease, unspecified: Secondary | ICD-10-CM | POA: Insufficient documentation

## 2018-10-18 DIAGNOSIS — Z9181 History of falling: Secondary | ICD-10-CM | POA: Diagnosis not present

## 2018-10-18 DIAGNOSIS — R2681 Unsteadiness on feet: Secondary | ICD-10-CM | POA: Diagnosis not present

## 2018-10-18 DIAGNOSIS — Z87891 Personal history of nicotine dependence: Secondary | ICD-10-CM | POA: Insufficient documentation

## 2018-10-18 DIAGNOSIS — N390 Urinary tract infection, site not specified: Secondary | ICD-10-CM | POA: Diagnosis not present

## 2018-10-18 DIAGNOSIS — K7030 Alcoholic cirrhosis of liver without ascites: Secondary | ICD-10-CM | POA: Diagnosis not present

## 2018-10-18 DIAGNOSIS — Z885 Allergy status to narcotic agent status: Secondary | ICD-10-CM | POA: Insufficient documentation

## 2018-10-18 DIAGNOSIS — F419 Anxiety disorder, unspecified: Secondary | ICD-10-CM | POA: Diagnosis not present

## 2018-10-18 DIAGNOSIS — Z882 Allergy status to sulfonamides status: Secondary | ICD-10-CM | POA: Insufficient documentation

## 2018-10-18 DIAGNOSIS — Z1159 Encounter for screening for other viral diseases: Secondary | ICD-10-CM | POA: Insufficient documentation

## 2018-10-18 DIAGNOSIS — K7290 Hepatic failure, unspecified without coma: Principal | ICD-10-CM | POA: Insufficient documentation

## 2018-10-18 DIAGNOSIS — M6281 Muscle weakness (generalized): Secondary | ICD-10-CM | POA: Diagnosis not present

## 2018-10-18 DIAGNOSIS — F10129 Alcohol abuse with intoxication, unspecified: Secondary | ICD-10-CM | POA: Diagnosis not present

## 2018-10-18 DIAGNOSIS — Z888 Allergy status to other drugs, medicaments and biological substances status: Secondary | ICD-10-CM | POA: Diagnosis not present

## 2018-10-18 DIAGNOSIS — Z880 Allergy status to penicillin: Secondary | ICD-10-CM | POA: Diagnosis not present

## 2018-10-18 DIAGNOSIS — Z881 Allergy status to other antibiotic agents status: Secondary | ICD-10-CM | POA: Diagnosis not present

## 2018-10-18 DIAGNOSIS — K7031 Alcoholic cirrhosis of liver with ascites: Secondary | ICD-10-CM | POA: Diagnosis present

## 2018-10-18 DIAGNOSIS — Z79899 Other long term (current) drug therapy: Secondary | ICD-10-CM | POA: Insufficient documentation

## 2018-10-18 DIAGNOSIS — Z9119 Patient's noncompliance with other medical treatment and regimen: Secondary | ICD-10-CM | POA: Diagnosis not present

## 2018-10-18 LAB — CBC
HCT: 28.6 % — ABNORMAL LOW (ref 36.0–46.0)
Hemoglobin: 8.6 g/dL — ABNORMAL LOW (ref 12.0–15.0)
MCH: 28.1 pg (ref 26.0–34.0)
MCHC: 30.1 g/dL (ref 30.0–36.0)
MCV: 93.5 fL (ref 80.0–100.0)
Platelets: 56 10*3/uL — ABNORMAL LOW (ref 150–400)
RBC: 3.06 MIL/uL — ABNORMAL LOW (ref 3.87–5.11)
RDW: 17.5 % — ABNORMAL HIGH (ref 11.5–15.5)
WBC: 8.1 10*3/uL (ref 4.0–10.5)
nRBC: 0 % (ref 0.0–0.2)

## 2018-10-18 NOTE — ED Triage Notes (Signed)
Pt with altered mental status worsening per friend that is with pt for last few weeks. Pt with jaundice noted, yellow sclera, swollen feet and abd noted. Pt's friend states she has been week and falling frequently for several day.

## 2018-10-19 ENCOUNTER — Observation Stay
Admission: EM | Admit: 2018-10-19 | Discharge: 2018-10-19 | Disposition: A | Discharge status: Home / Self Care | Payer: BLUE CROSS/BLUE SHIELD | Attending: Specialist | Admitting: Specialist

## 2018-10-19 DIAGNOSIS — K7030 Alcoholic cirrhosis of liver without ascites: Secondary | ICD-10-CM | POA: Diagnosis present

## 2018-10-19 DIAGNOSIS — K7290 Hepatic failure, unspecified without coma: Secondary | ICD-10-CM

## 2018-10-19 DIAGNOSIS — F10929 Alcohol use, unspecified with intoxication, unspecified: Secondary | ICD-10-CM | POA: Diagnosis present

## 2018-10-19 DIAGNOSIS — K7460 Unspecified cirrhosis of liver: Secondary | ICD-10-CM | POA: Diagnosis present

## 2018-10-19 DIAGNOSIS — K7031 Alcoholic cirrhosis of liver with ascites: Secondary | ICD-10-CM

## 2018-10-19 DIAGNOSIS — K7682 Hepatic encephalopathy: Secondary | ICD-10-CM

## 2018-10-19 LAB — URINALYSIS, COMPLETE (UACMP) WITH MICROSCOPIC
Bilirubin Urine: NEGATIVE
Glucose, UA: NEGATIVE mg/dL
Ketones, ur: NEGATIVE mg/dL
Nitrite: POSITIVE — AB
Protein, ur: 100 mg/dL — AB
Specific Gravity, Urine: 1.014 (ref 1.005–1.030)
WBC, UA: >50 WBC/hpf — ABNORMAL HIGH (ref 0–5)
pH: 6 (ref 5.0–8.0)

## 2018-10-19 LAB — PROTIME-INR
INR: 1.9 — ABNORMAL HIGH (ref 0.8–1.2)
Prothrombin Time: 21.7 seconds — ABNORMAL HIGH (ref 11.4–15.2)

## 2018-10-19 LAB — COMPREHENSIVE METABOLIC PANEL
ALT: 38 U/L (ref 0–44)
AST: 126 U/L — ABNORMAL HIGH (ref 15–41)
Albumin: 3.1 g/dL — ABNORMAL LOW (ref 3.5–5.0)
Alkaline Phosphatase: 99 U/L (ref 38–126)
Anion gap: 10 (ref 5–15)
BUN: 8 mg/dL (ref 6–20)
CO2: 26 mmol/L (ref 22–32)
Calcium: 8.2 mg/dL — ABNORMAL LOW (ref 8.9–10.3)
Chloride: 102 mmol/L (ref 98–111)
Creatinine, Ser: 0.56 mg/dL (ref 0.44–1.00)
GFR calc Af Amer: >60 mL/min (ref >60)
GFR calc non Af Amer: >60 mL/min (ref >60)
Glucose, Bld: 174 mg/dL — ABNORMAL HIGH (ref 70–99)
Potassium: 3.4 mmol/L — ABNORMAL LOW (ref 3.5–5.1)
Sodium: 138 mmol/L (ref 135–145)
Total Bilirubin: 9.1 mg/dL — ABNORMAL HIGH (ref 0.3–1.2)
Total Protein: 7.7 g/dL (ref 6.5–8.1)

## 2018-10-19 LAB — AMMONIA
Ammonia: 120 umol/L — ABNORMAL HIGH (ref 9–35)
Ammonia: 45 umol/L — ABNORMAL HIGH (ref 9–35)

## 2018-10-19 LAB — ETHANOL: Alcohol, Ethyl (B): 465 mg/dL (ref <10)

## 2018-10-19 LAB — SARS CORONAVIRUS 2 BY RT PCR (HOSPITAL ORDER, PERFORMED IN ~~LOC~~ HOSPITAL LAB): SARS Coronavirus 2: NEGATIVE

## 2018-10-19 MED ORDER — LORAZEPAM 2 MG PO TABS: 0.0000 mg | ORAL_TABLET | Freq: Two times a day (BID) | ORAL | Status: DC

## 2018-10-19 MED ORDER — RIFAXIMIN 550 MG PO TABS: 550.0000 mg | ORAL_TABLET | Freq: Two times a day (BID) | ORAL | 0 refills | Status: DC

## 2018-10-19 MED ORDER — VITAMIN B-1 100 MG PO TABS
100.0000 mg | ORAL_TABLET | Freq: Every day | ORAL | Status: DC
  Administered 2018-10-19: 100 mg via ORAL
  Filled 2018-10-19: qty 1

## 2018-10-19 MED ORDER — PANTOPRAZOLE SODIUM 40 MG PO TBEC
40.0000 mg | DELAYED_RELEASE_TABLET | Freq: Every day | ORAL | Status: DC
  Administered 2018-10-19: 40 mg via ORAL
  Filled 2018-10-19: qty 1

## 2018-10-19 MED ORDER — ADULT MULTIVITAMIN W/MINERALS CH
1.0000 | ORAL_TABLET | Freq: Every day | ORAL | Status: DC
  Administered 2018-10-19: 10:00:00 1 via ORAL
  Filled 2018-10-19: qty 1

## 2018-10-19 MED ORDER — ONDANSETRON HCL 4 MG/2ML IJ SOLN
4.0000 mg | Freq: Once | INTRAMUSCULAR | Status: AC
  Administered 2018-10-19: 02:00:00 4 mg via INTRAVENOUS
  Filled 2018-10-19: qty 2

## 2018-10-19 MED ORDER — LORAZEPAM 2 MG/ML IJ SOLN: 1.0000 mg | Freq: Four times a day (QID) | INTRAMUSCULAR | Status: DC | PRN

## 2018-10-19 MED ORDER — PANTOPRAZOLE SODIUM 40 MG PO TBEC: 40.0000 mg | DELAYED_RELEASE_TABLET | Freq: Every day | ORAL | 0 refills | Status: DC

## 2018-10-19 MED ORDER — CIPROFLOXACIN HCL 250 MG PO TABS: 250.0000 mg | ORAL_TABLET | Freq: Two times a day (BID) | ORAL | 0 refills | Status: AC

## 2018-10-19 MED ORDER — LACTULOSE 10 GM/15ML PO SOLN: ORAL | 0 refills | Status: DC

## 2018-10-19 MED ORDER — LORAZEPAM 2 MG PO TABS: 0.0000 mg | ORAL_TABLET | Freq: Four times a day (QID) | ORAL | Status: DC

## 2018-10-19 MED ORDER — IBUPROFEN 400 MG PO TABS: 400.0000 mg | ORAL_TABLET | Freq: Four times a day (QID) | ORAL | Status: DC | PRN

## 2018-10-19 MED ORDER — FOLIC ACID 1 MG PO TABS: 1.0000 mg | ORAL_TABLET | Freq: Every day | ORAL | 0 refills | Status: DC

## 2018-10-19 MED ORDER — RIFAXIMIN 550 MG PO TABS
550.0000 mg | ORAL_TABLET | Freq: Two times a day (BID) | ORAL | Status: DC
  Administered 2018-10-19: 10:00:00 550 mg via ORAL
  Filled 2018-10-19 (×2): qty 1

## 2018-10-19 MED ORDER — ONDANSETRON HCL 4 MG/2ML IJ SOLN: 4.0000 mg | Freq: Four times a day (QID) | INTRAMUSCULAR | Status: DC | PRN

## 2018-10-19 MED ORDER — FOLIC ACID 1 MG PO TABS
1.0000 mg | ORAL_TABLET | Freq: Every day | ORAL | Status: DC
  Administered 2018-10-19: 10:00:00 1 mg via ORAL
  Filled 2018-10-19: qty 1

## 2018-10-19 MED ORDER — LORAZEPAM 1 MG PO TABS: 1.0000 mg | ORAL_TABLET | Freq: Four times a day (QID) | ORAL | Status: DC | PRN

## 2018-10-19 MED ORDER — THIAMINE HCL 100 MG PO TABS: 100.0000 mg | ORAL_TABLET | Freq: Every day | ORAL | 0 refills | Status: DC

## 2018-10-19 MED ORDER — LACTULOSE 10 GM/15ML PO SOLN
30.0000 g | Freq: Once | ORAL | Status: AC
  Administered 2018-10-19: 30 g via ORAL
  Filled 2018-10-19: qty 60

## 2018-10-19 MED ORDER — ONDANSETRON HCL 4 MG PO TABS: 4.0000 mg | ORAL_TABLET | Freq: Four times a day (QID) | ORAL | Status: DC | PRN

## 2018-10-19 MED ORDER — CIPROFLOXACIN HCL 500 MG PO TABS
250.0000 mg | ORAL_TABLET | Freq: Two times a day (BID) | ORAL | Status: DC
  Administered 2018-10-19: 250 mg via ORAL
  Filled 2018-10-19: qty 1

## 2018-10-19 MED ORDER — SODIUM CHLORIDE 0.9 % IV SOLN
Freq: Once | INTRAVENOUS | Status: AC
  Administered 2018-10-19: 02:00:00 via INTRAVENOUS

## 2018-10-19 MED ORDER — LACTULOSE 10 GM/15ML PO SOLN
20.0000 g | Freq: Two times a day (BID) | ORAL | Status: DC
  Administered 2018-10-19: 20 g via ORAL
  Filled 2018-10-19: qty 30

## 2018-10-19 MED ORDER — THIAMINE HCL 100 MG/ML IJ SOLN: 100.0000 mg | Freq: Every day | INTRAMUSCULAR | Status: DC

## 2018-10-19 NOTE — H&P (Signed)
[facility] [place] NAME: Lisa Dennis    MR#:  [id number]  DATE OF BIRTH:  1965/05/28  DATE OF ADMISSION:  10/19/2018  PRIMARY CARE PHYSICIAN: Patient, No Pcp Per   REQUESTING/REFERRING PHYSICIAN: Ellender Hose, MD  CHIEF COMPLAINT:   Chief Complaint  Patient presents with  . Altered Mental Status    HISTORY OF PRESENT ILLNESS:  Lisa Dennis  is a 53 y.o. female who presents with chief complaint as above.  Patient presents to the ED with reported change in mental status.  She has a history of alcoholic cirrhosis, with prior hospitalizations for hepatic encephalopathy.  Today in the ED her ammonia level is up to 120, which is significantly elevated for her.  She also has an alcohol level in the 400s.  Hospitalist were called for admission  PAST MEDICAL HISTORY:   Past Medical History:  Diagnosis Date  . Alcohol abuse   . Anxiety   . Cirrhosis ([place])      PAST SURGICAL HISTORY:   Past Surgical History:  Procedure Laterality Date  . ESOPHAGOGASTRODUODENOSCOPY (EGD) WITH PROPOFOL N/A 06/08/2018   Procedure: ESOPHAGOGASTRODUODENOSCOPY (EGD) WITH PROPOFOL;  Surgeon: Lucilla Lame, MD;  Location: [facility] ENDOSCOPY;  Service: Endoscopy;  Laterality: N/A;     SOCIAL HISTORY:   Social History   Tobacco Use  . Smoking status: Former Research scientist (life sciences)  . Smokeless tobacco: Never Used  Substance Use Topics  . Alcohol use: Yes    Comment: heavy     FAMILY HISTORY:    Family history reviewed and is non-contributory DRUG ALLERGIES:   Allergies  Allergen Reactions  . Benadryl [Diphenhydramine] Other (See Comments)    Reaction: makes crazy and can't sleep for days  . Latex Hives  . Morphine And Related Hives  . Penicillins Hives and Other (See Comments)    Did it involve swelling of the face/tongue/throat, SOB, or low BP? No Did it involve sudden or severe rash/hives, skin peeling, or any reaction on the inside of your  mouth or nose? Unknown Did you need to seek medical attention at a hospital or doctor's office? Unknown When did it last happen?unknown If all above answers are "NO", may proceed with cephalosporin use.   . Sulfa Antibiotics Hives  . Tetracyclines & Related Hives    MEDICATIONS AT HOME:   Prior to Admission medications   Medication Sig Start Date End Date Taking? Authorizing Provider  folic acid (FOLVITE) 1 MG tablet Take 1 tablet (1 mg total) by mouth daily. Patient not taking: Reported on 10/19/2018 08/30/18   Loletha Grayer, MD  lactulose [facility]) 10 GM/15ML solution 20gm oral twice a day (goal is 3 bowel movements a day, can go to three or four times a day dosing if needed) Patient not taking: Reported on 10/19/2018 08/30/18   Loletha Grayer, MD  pantoprazole (PROTONIX) 40 MG tablet Take 1 tablet (40 mg total) by mouth daily. Patient not taking: Reported on 10/19/2018 08/30/18   Loletha Grayer, MD  rifaximin (XIFAXAN) 550 MG TABS tablet Take 1 tablet (550 mg total) by mouth 2 (two) times daily. Patient not taking: Reported on 10/19/2018 08/30/18   Loletha Grayer, MD  thiamine 100 MG tablet Take 1 tablet (100 mg total) by mouth daily. Patient not taking: Reported on 10/19/2018 08/30/18   Loletha Grayer, MD    REVIEW OF SYSTEMS:  Review of Systems  Unable to perform ROS: Acuity of condition     VITAL SIGNS:  Vitals:   10/18/18 2327 10/19/18 0302  BP: 135/79   Pulse: (!) 123 83  Resp: 18 17  Temp: 98.3 F (36.8 C) 98 F (36.7 C)  TempSrc: Oral   SpO2: 96% 98%  Weight: 45.4 kg   Height: 5\' 1"  (1.549 m)    Wt Readings from Last 3 Encounters:  10/18/18 45.4 kg  09/07/18 54 kg  08/28/18 54.5 kg    PHYSICAL EXAMINATION:  Physical Exam  Vitals reviewed. Constitutional: She appears well-developed and well-nourished. No distress.  HENT:  Head: Normocephalic and atraumatic.  Mouth/Throat: Oropharynx is clear and moist.  Eyes: Pupils are equal, round, and reactive to  light. Conjunctivae and EOM are normal. No scleral icterus.  Neck: Normal range of motion. Neck supple. No JVD present. No thyromegaly present.  Cardiovascular: Normal rate, regular rhythm and intact distal pulses. Exam reveals no gallop and no friction rub.  No murmur heard. Respiratory: Effort normal and breath sounds normal. No respiratory distress. She has no wheezes. She has no rales.  GI: Soft. Bowel sounds are normal. She exhibits no distension. There is no abdominal tenderness.  Musculoskeletal: Normal range of motion.        General: No edema.     Comments: No arthritis, no gout  Lymphadenopathy:    She has no cervical adenopathy.  Neurological: She is alert. No cranial nerve deficit.  No dysarthria, no aphasia  Skin: Skin is warm and dry. No rash noted. No erythema.  Psychiatric:  Unable to assess due to patient condition    LABORATORY PANEL:   CBC Recent Labs  Lab 10/18/18 2336  WBC 8.1  HGB 8.6*  HCT 28.6*  PLT 56*   ------------------------------------------------------------------------------------------------------------------  Chemistries  Recent Labs  Lab 10/18/18 2336  NA 138  K 3.4*  CL 102  CO2 26  GLUCOSE 174*  BUN 8  CREATININE 0.56  CALCIUM 8.2*  AST 126*  ALT 38  ALKPHOS 99  BILITOT 9.1*   ------------------------------------------------------------------------------------------------------------------  Cardiac Enzymes No results for input(s): TROPONINI in the last 168 hours. ------------------------------------------------------------------------------------------------------------------  RADIOLOGY:  No results found.  EKG:   Orders placed or performed during the hospital encounter of 08/27/18  . ED EKG  . ED EKG  . EKG 12-Lead  . EKG 12-Lead    IMPRESSION AND PLAN:  Principal Problem:   Hepatic encephalopathy (HCC) -lactulose given in the ED, we will repeat this dose later on.  Patient reported not taking her lactulose at  home.  We will also order her prescribed dose of rifaximin. Active Problems:   Alcoholic intoxication with complication (HCC) -CIWA protocol   Alcoholic cirrhosis of liver without ascites ([place]) -avoid hepatotoxins, patient has chronic thrombocytopenia, avoid anticoagulation  Chart review performed and case discussed with ED provider. Labs, imaging and/or ECG reviewed by provider and discussed with patient/family. Management plans discussed with the patient and/or family.  COVID-19 status: Pending  DVT PROPHYLAXIS: Mechanical only  GI PROPHYLAXIS:  PPI   ADMISSION STATUS: Inpatient     CODE STATUS: Full Code Status History    Date Active Date Inactive Code Status Order ID Comments User Context   08/28/2018 0049 08/30/2018 1313 Full Code [id number]  Mayer Camel, NP ED   08/28/2018 0021 08/28/2018 0049 Full Code [id number]  Gregor Hams, MD ED   06/08/2018 0424 06/09/2018 1348 Full Code [id number]  Mansy, Arvella Merles, MD ED   Advance Care Planning Activity      TOTAL TIME TAKING CARE OF THIS PATIENT: 46  minutes.   This patient was evaluated in the context of the global COVID-19 pandemic, which necessitated consideration that the patient might be at risk for infection with the SARS-CoV-2 virus that causes COVID-19. Institutional protocols and algorithms that pertain to the evaluation of patients at risk for COVID-19 are in a state of rapid change based on information released by regulatory bodies including the CDC and federal and state organizations. These policies and algorithms were followed to the best of this provider's knowledge to date during the patient's care at this facility.  Ethlyn Daniels 10/19/2018, 3:02 AM  [facility]  Office  [phone]  CC: Primary care physician; Patient, No Pcp Per  Note:  This document was prepared using Dragon voice recognition software and may include unintentional dictation errors.

## 2018-10-19 NOTE — TOC Initial Note (Signed)
Transition of Care (TOC) - Initial/Assessment Note    Patient Details  Name: Lisa Dennis MRN: [id number] Date of Birth: 12/01/1965  Transition of Care (TOC) CM/SW Contact:    Sample, Bailey M, LCSW Phone Number: [phone]  10/19/2018, 1:53 PM  Clinical Narrative: Clinical Social Worker (CSW) received consult that patient does not have a PCP. CSW met with patient alone at bedside to discuss D/C plan and offer resources. Patient was alert and oriented X4 and was sitting up in the bed. CSW introduced self and explained role of CSW department. Per patient she recently moved to [place] from [place] and does not have a PCP yet. Patient reported that she has heard good things about Dr. Mark Miller. CSW explained that Dr. Miller is with [facility] and provided patient with [facility] address and phone number. Per patient she will contact [facility] to set up appointment. Patient reported that she drives and is independent with her ADLs. Patient reported that she does rely on friends for transportation sometimes when she is not feeling well. Patient reported that she went to [place] and has friends in the area. Patient reported that she has a strong social support system with her neighbors and friends. Patient reported no other needs or concerns. PT is recommending no follow up. CSW will continue to follow and assist as needed.             Expected Discharge Plan: Home/Self Care Barriers to Discharge: Continued Medical Work up   Patient Goals and CMS Choice Patient states their goals for this hospitalization and ongoing recovery are:: To feel better      Expected Discharge Plan and Services Expected Discharge Plan: Home/Self Care   Discharge Planning Services: CM Consult   Living arrangements for the past 2 months: Single Family Home                                      Prior Living Arrangements/Services Living arrangements for the past 2 months:  Single Family Home Lives with:: Self Patient language and need for interpreter reviewed:: No Do you feel safe going back to the place where you live?: Yes      Need for Family Participation in Patient Care: No (Comment) Care giver support system in place?: Yes (comment)   Criminal Activity/Legal Involvement Pertinent to Current Situation/Hospitalization: No - Comment as needed  Activities of Daily Living Home Assistive Devices/Equipment: None ADL Screening (condition at time of admission) Patient's cognitive ability adequate to safely complete daily activities?: Yes Is the patient deaf or have difficulty hearing?: Yes Does the patient have difficulty seeing, even when wearing glasses/contacts?: No Does the patient have difficulty concentrating, remembering, or making decisions?: No Patient able to express need for assistance with ADLs?: Yes Does the patient have difficulty dressing or bathing?: No Independently performs ADLs?: Yes (appropriate for developmental age) Does the patient have difficulty walking or climbing stairs?: Yes Weakness of Legs: Both Weakness of Arms/Hands: None  Permission Sought/Granted                  Emotional Assessment Appearance:: Appears stated age   Affect (typically observed): Calm, Pleasant Orientation: : Oriented to Self, Oriented to Place, Oriented to  Time, Oriented to Situation Alcohol / Substance Use: Not Applicable Psych Involvement: No (comment)  Admission diagnosis:  Hepatic encephalopathy (HCC) [K72.90] Alcoholic cirrhosis of liver with ascites (HCC) [  K70.31] Patient Active Problem List   Diagnosis Date Noted  . Alcoholic intoxication with complication (HCC) 10/19/2018  . Hepatic encephalopathy (HCC) 08/27/2018  . GI bleeding 06/08/2018  . Alcoholic cirrhosis of liver without ascites (HCC)   . Hematemesis without nausea    PCP:  Patient, No Pcp Per Pharmacy:   [id number] - [place], [place] - [address] AT  [address] [place] [address] Phone: [phone] Fax: [phone]     Social Determinants of Health (SDOH) Interventions    Readmission Risk Interventions Readmission Risk Prevention Plan 08/30/2018  Transportation Screening Complete  PCP or Specialist Appt within 5-7 Days Not Complete  Not Complete comments unable to make pcp appt on a Sunday  Home Care Screening Complete  Medication Review (RN CM) Complete    

## 2018-10-19 NOTE — Plan of Care (Signed)
Pt is d/ced home on an abx b/c of  UTI.  She came in w/hepatic encephalopathy.  Elevated Ammonia and blood alcohol level of 465.  Pt stated that she doesn't drink that much.  Dr. Verdell Carmine both said she cannot drink anymore b/c of poor liver function.  She is A&O - wked with PT well.  She feels that she has good support with friends and her mother is en route from [place], [place].  IV has been removed.  Will review new medications, f/u appts and d/c instructions.  Her friends will transport home.

## 2018-10-19 NOTE — Evaluation (Signed)
Physical Therapy Evaluation Patient Details Name: Lisa Dennis MRN: [id number] DOB: June 16, 1965 Today's Date: 10/19/2018   History of Present Illness  Pt admitted for hepatic encephalopathy with complaints of AMS and falls. History includes alcoholic cirrhosis and anxiety  Clinical Impression  Pt is a pleasant 53 year old female who was admitted for hepatic encephalopathy. Pt performs bed mobility/transfers with independence and ambulation with supervision without use of AD. Pt reports she has frequent urge for urination, up to bathroom multiple times. Reports she feels she is at her baseline physically. Pt does not require any further PT needs at this time. Pt will be dc in house and does not require follow up. RN aware. Will dc current orders.      Follow Up Recommendations No PT follow up    Equipment Recommendations  None recommended by PT    Recommendations for Other Services       Precautions / Restrictions Precautions Precautions: Fall Restrictions Weight Bearing Restrictions: No      Mobility  Bed Mobility Overal bed mobility: Independent             General bed mobility comments: safe technique   Transfers Overall transfer level: Independent Equipment used: None             General transfer comment: no concerns  Ambulation/Gait Ambulation/Gait assistance: Supervision Gait Distance (Feet): 200 Feet Assistive device: None Gait Pattern/deviations: WFL(Within Functional Limits)     General Gait Details: safe technique with reciprocal gait and good speed. Upright posture noted. Able to carry conversation during ambulation  Stairs            Wheelchair Mobility    Modified Rankin (Stroke Patients Only)       Balance Overall balance assessment: Independent                                           Pertinent Vitals/Pain Pain Assessment: No/denies pain    Home Living Family/patient expects to be discharged  to:: Private residence Living Arrangements: Alone Available Help at Discharge: Friend(s) Type of Home: House Home Access: Stairs to enter Entrance Stairs-Rails: Right Entrance Stairs-Number of Steps: 10 Home Layout: One level Home Equipment: Grab bars - tub/shower;Grab bars - toilet      Prior Function Level of Independence: Independent         Comments: recently moved into new house. Reports falls happen on slick surfaces     Hand Dominance        Extremity/Trunk Assessment   Upper Extremity Assessment Upper Extremity Assessment: Overall WFL for tasks assessed    Lower Extremity Assessment Lower Extremity Assessment: Overall WFL for tasks assessed       Communication   Communication: No difficulties  Cognition Arousal/Alertness: Awake/alert Behavior During Therapy: WFL for tasks assessed/performed Overall Cognitive Status: Within Functional Limits for tasks assessed                                        General Comments      Exercises Other Exercises Other Exercises: ambulated to [facility] as pt has increased urgency. Supervision given for safety. Pt able to perform hygiene safely   Assessment/Plan    PT Assessment Patent does not need any further PT services  PT Problem List  PT Treatment Interventions      PT Goals (Current goals can be found in the Care Plan section)  Acute Rehab PT Goals Patient Stated Goal: to go home PT Goal Formulation: All assessment and education complete, DC therapy Time For Goal Achievement: 10/19/18 Potential to Achieve Goals: Good    Frequency     Barriers to discharge        Co-evaluation               AM-PAC PT "6 Clicks" Mobility  Outcome Measure Help needed turning from your back to your side while in a flat bed without using bedrails?: None Help needed moving from lying on your back to sitting on the side of a flat bed without using bedrails?: None Help needed moving to and from a  bed to a chair (including a wheelchair)?: None Help needed standing up from a chair using your arms (e.g., wheelchair or bedside chair)?: None Help needed to walk in hospital room?: None Help needed climbing 3-5 steps with a railing? : None 6 Click Score: 24    End of Session Equipment Utilized During Treatment: Gait belt Activity Tolerance: Patient tolerated treatment well Patient left: in bed Nurse Communication: Mobility status PT Visit Diagnosis: Unsteadiness on feet (R26.81);History of falling (Z91.81);Muscle weakness (generalized) (M62.81)    Time: 5208-0223 PT Time Calculation (min) (ACUTE ONLY): 20 min   Charges:   PT Evaluation $PT Eval Low Complexity: 1 Low PT Treatments $Therapeutic Activity: 8-22 mins        Greggory Stallion, PT, DPT [phone]   Travus Oren 10/19/2018, 1:43 PM

## 2018-10-19 NOTE — Plan of Care (Signed)
Pt being d/ced home w/abx for UTI.

## 2018-10-19 NOTE — ED Provider Notes (Signed)
[facility] Emergency Department Provider Note  ____________________________________________   First MD Initiated Contact with Patient 10/19/18 [phone]     (approximate)  I have reviewed the triage vital signs and the nursing notes.   HISTORY  Chief Complaint Altered Mental Status    HPI Lisa Dennis is a 53 y.o. female  With h/o Etoh Abuse and alcoholic cirrhosis here with confusion. History somewhat limited 2/2 intoxication. However, pt states that her friends brought her here because they were concerned that she has been more confused than usual. She admits she has been "forgetting things" and "dazed" all the time for at least the last week. She has had mild nausea and increasing abdominal distension and girth with this. Denies any fevers. No cough. She states that she has an appt with her GI doctor this week, but that she has been told "a lot of different things" by doctors and she has been confused, so she has actually not been taking any of her lactulose oro there medications. She has also been drinking but states "not even that much." No other complaints.  Level 5 caveat invoked as remainder of history, ROS, and physical exam limited due to patient's encephalopathy.         Past Medical History:  Diagnosis Date  . Alcohol abuse   . Anxiety   . Cirrhosis [facility])     Patient Active Problem List   Diagnosis Date Noted  . Hepatic encephalopathy ([place]) 08/27/2018  . GI bleeding 06/08/2018  . Alcoholic cirrhosis of liver without ascites ([place])   . Hematemesis without nausea     Past Surgical History:  Procedure Laterality Date  . ESOPHAGOGASTRODUODENOSCOPY (EGD) WITH PROPOFOL N/A 06/08/2018   Procedure: ESOPHAGOGASTRODUODENOSCOPY (EGD) WITH PROPOFOL;  Surgeon: Lucilla Lame, MD;  Location: [facility] ENDOSCOPY;  Service: Endoscopy;  Laterality: N/A;    Prior to Admission medications   Medication Sig Start Date End Date Taking? Authorizing  Provider  folic acid (FOLVITE) 1 MG tablet Take 1 tablet (1 mg total) by mouth daily. 08/30/18   Loletha Grayer, MD  lactulose [facility]) 10 GM/15ML solution 20gm oral twice a day (goal is 3 bowel movements a day, can go to three or four times a day dosing if needed) 08/30/18   Loletha Grayer, MD  pantoprazole (PROTONIX) 40 MG tablet Take 1 tablet (40 mg total) by mouth daily. 08/30/18   Loletha Grayer, MD  rifaximin (XIFAXAN) 550 MG TABS tablet Take 1 tablet (550 mg total) by mouth 2 (two) times daily. 08/30/18   Loletha Grayer, MD  thiamine 100 MG tablet Take 1 tablet (100 mg total) by mouth daily. 08/30/18   Loletha Grayer, MD    Allergies Benadryl [diphenhydramine], Morphine and related, Penicillins, Sulfa antibiotics, and Tetracyclines & related  No family history on file.  Social History Social History   Tobacco Use  . Smoking status: Former Research scientist (life sciences)  . Smokeless tobacco: Never Used  Substance Use Topics  . Alcohol use: Yes    Comment: heavy  . Drug use: Not on file    Review of Systems  Review of Systems  Unable to perform ROS: Mental status change  Constitutional: Positive for fatigue.  Cardiovascular: Positive for leg swelling.  Gastrointestinal: Positive for nausea.  Neurological: Positive for weakness.  Psychiatric/Behavioral: Positive for confusion.     ____________________________________________  PHYSICAL EXAM:      VITAL SIGNS: ED Triage Vitals [10/18/18 2327]  Enc Vitals Group     BP 135/79  Pulse Rate (!) 123     Resp 18     Temp 98.3 F (36.8 C)     Temp Source Oral     SpO2 96 %     Weight 100 lb (45.4 kg)     Height 5\' 1"  (1.549 m)     Head Circumference      Peak Flow      Pain Score 0     Pain Loc      Pain Edu?      Excl. in [place]?      Physical Exam Vitals signs and nursing note reviewed.  Constitutional:      General: She is not in acute distress.    Appearance: She is well-developed.     Comments: Chronically ill appearing   HENT:     Head: Normocephalic and atraumatic.  Eyes:     General: Scleral icterus present.     Conjunctiva/sclera: Conjunctivae normal.  Neck:     Musculoskeletal: Neck supple.  Cardiovascular:     Rate and Rhythm: Normal rate and regular rhythm.     Heart sounds: Normal heart sounds. No murmur. No friction rub.  Pulmonary:     Effort: Pulmonary effort is normal. No respiratory distress.     Breath sounds: Normal breath sounds. No wheezing or rales.  Abdominal:     General: There is distension.     Palpations: Abdomen is soft.     Tenderness: There is no abdominal tenderness.  Musculoskeletal:     Right lower leg: Edema (1+ pitting) present.     Left lower leg: Edema (1+ pitting) present.  Skin:    General: Skin is warm.     Capillary Refill: Capillary refill takes less than 2 seconds.  Neurological:     Mental Status: She is alert and oriented to person, place, and time.     Motor: No abnormal muscle tone.       ____________________________________________   LABS (all labs ordered are listed, but only abnormal results are displayed)  Labs Reviewed  COMPREHENSIVE METABOLIC PANEL - Abnormal; Notable for the following components:      Result Value   Potassium 3.4 (*)    Glucose, Bld 174 (*)    Calcium 8.2 (*)    Albumin 3.1 (*)    AST 126 (*)    Total Bilirubin 9.1 (*)    All other components within normal limits  CBC - Abnormal; Notable for the following components:   RBC 3.06 (*)    Hemoglobin 8.6 (*)    HCT 28.6 (*)    RDW 17.5 (*)    Platelets 56 (*)    All other components within normal limits  PROTIME-INR - Abnormal; Notable for the following components:   Prothrombin Time 21.7 (*)    INR 1.9 (*)    All other components within normal limits  AMMONIA - Abnormal; Notable for the following components:   Ammonia 120 (*)    All other components within normal limits  ETHANOL - Abnormal; Notable for the following components:   Alcohol, Ethyl (B) 465 (*)     All other components within normal limits  SARS CORONAVIRUS 2 (HOSPITAL ORDER, [employer] [facility])    ____________________________________________  EKG: None ________________________________________  RADIOLOGY All imaging, including plain films, CT scans, and ultrasounds, independently reviewed by me, and interpretations confirmed via formal radiology reads.  ED MD interpretation:   None  Official radiology report(s): No results found.  ____________________________________________  PROCEDURES   Procedure(s) performed (including Critical Care):  Procedures  ____________________________________________  INITIAL IMPRESSION / MDM / ASSESSMENT AND PLAN / ED COURSE  As part of my medical decision making, I reviewed the following data within the electronic MEDICAL RECORD NUMBER Notes from prior ED visits and [place] Controlled Substance Database      *Lisa Dennis was evaluated in Emergency Department on 10/19/2018 for the symptoms described in the history of present illness. She was evaluated in the context of the global COVID-19 pandemic, which necessitated consideration that the patient might be at risk for infection with the SARS-CoV-2 virus that causes COVID-19. Institutional protocols and algorithms that pertain to the evaluation of patients at risk for COVID-19 are in a state of rapid change based on information released by regulatory bodies including the CDC and federal and state organizations. These policies and algorithms were followed during the patient's care in the ED.  Some ED evaluations and interventions may be delayed as a result of limited staffing during the pandemic.*      Medical Decision Making: 53 yo F here with confusion in setting of alcoholic cirrhosis. Labs show marked hyperammonemia c/w hepatic encephalopathy, likely complicated by ongoing alcohol abuse. Labs are c/w worsening cirrhosis with hypoalbuminemia and elevated INR. No signs of  trauma, no focal neuro deficits. Will start on fluids, lactulose, admit. No leukocytosis, fever, or signs to suggest SBP or infectious trigger.  ____________________________________________  FINAL CLINICAL IMPRESSION(S) / ED DIAGNOSES  Final diagnoses:  Hepatic encephalopathy ([place])  Alcoholic cirrhosis of liver with ascites ([place])     MEDICATIONS GIVEN DURING THIS VISIT:  Medications  0.9 %  sodium chloride infusion (has no administration in time range)  lactulose (CHRONULAC) 10 GM/15ML solution 30 g (has no administration in time range)  ondansetron (ZOFRAN) injection 4 mg (4 mg Intravenous Given 10/19/18 0141)     ED Discharge Orders    None       Note:  This document was prepared using Dragon voice recognition software and may include unintentional dictation errors.   Duffy Bruce, MD 10/19/18 [phone]

## 2018-10-19 NOTE — ED Notes (Signed)
ED TO INPATIENT HANDOFF REPORT  ED Nurse Name and Phone #: Balinda Quails Name/Age/Gender Lisa Dennis 53 y.o. female Room/Bed: ED05A/ED05A  Code Status   Code Status: Prior  Home/SNF/Other Home Patient oriented to: self, place, time and situation Is this baseline? Yes   Triage Complete: Triage complete  Chief Complaint Altered Mentally, Falling often.  Triage Note Pt with altered mental status worsening per friend that is with pt for last few weeks. Pt with jaundice noted, yellow sclera, swollen feet and abd noted. Pt's friend states she has been week and falling frequently for several day.    Allergies Allergies  Allergen Reactions  . Benadryl [Diphenhydramine] Other (See Comments)    Reaction: makes crazy and can't sleep for days  . Latex Hives  . Morphine And Related Hives  . Penicillins Hives and Other (See Comments)    Did it involve swelling of the face/tongue/throat, SOB, or low BP? No Did it involve sudden or severe rash/hives, skin peeling, or any reaction on the inside of your mouth or nose? Unknown Did you need to seek medical attention at a hospital or doctor's office? Unknown When did it last happen?unknown If all above answers are "NO", may proceed with cephalosporin use.   . Sulfa Antibiotics Hives  . Tetracyclines & Related Hives    Level of Care/Admitting Diagnosis ED Disposition    ED Disposition Condition Comment   Admit  Hospital Area: [place] [[id number]]  Level of Care: Med-Surg [16]  Covid Evaluation: Asymptomatic Screening Protocol (No Symptoms)  Diagnosis: Hepatic encephalopathy ([place]) [572.2.ICD-9-CM]  Admitting Physician: Lance Coon [[id number]]  Attending Physician: Lance Coon [phone]  Estimated length of stay: past midnight tomorrow  Certification:: I certify this patient will need inpatient services for at least 2 midnights  PT Class (Do Not Modify): Inpatient [101]  PT Acc Code (Do Not Modify):  Private [1]       B Medical/Surgery History Past Medical History:  Diagnosis Date  . Alcohol abuse   . Anxiety   . Cirrhosis [facility])    Past Surgical History:  Procedure Laterality Date  . ESOPHAGOGASTRODUODENOSCOPY (EGD) WITH PROPOFOL N/A 06/08/2018   Procedure: ESOPHAGOGASTRODUODENOSCOPY (EGD) WITH PROPOFOL;  Surgeon: Lucilla Lame, MD;  Location: [facility] ENDOSCOPY;  Service: Endoscopy;  Laterality: N/A;     A IV Location/Drains/Wounds Patient Lines/Drains/Airways Status   Active Line/Drains/Airways    Name:   Placement date:   Placement time:   Site:   Days:   Peripheral IV 10/18/18 Right Hand   10/18/18    2330    Hand   1          Intake/Output Last 24 hours No intake or output data in the 24 hours ending 10/19/18 0305  Labs/Imaging Results for orders placed or performed during the hospital encounter of 10/19/18 (from the past 48 hour(s))  Comprehensive metabolic panel     Status: Abnormal   Collection Time: 10/18/18 11:36 PM  Result Value Ref Range   Sodium 138 135 - 145 mmol/L   Potassium 3.4 (L) 3.5 - 5.1 mmol/L   Chloride 102 98 - 111 mmol/L   CO2 26 22 - 32 mmol/L   Glucose, Bld 174 (H) 70 - 99 mg/dL   BUN 8 6 - 20 mg/dL   Creatinine, Ser 0.56 0.44 - 1.00 mg/dL   Calcium 8.2 (L) 8.9 - 10.3 mg/dL   Total Protein 7.7 6.5 - 8.1 g/dL   Albumin 3.1 (L) 3.5 - 5.0 g/dL  AST 126 (H) 15 - 41 U/L   ALT 38 0 - 44 U/L   Alkaline Phosphatase 99 38 - 126 U/L   Total Bilirubin 9.1 (H) 0.3 - 1.2 mg/dL   GFR calc non Af Amer >60 >60 mL/min   GFR calc Af Amer >60 >60 mL/min   Anion gap 10 5 - 15    Comment: Performed at [facility], [address] [place], [place] [address]  CBC     Status: Abnormal   Collection Time: 10/18/18 11:36 PM  Result Value Ref Range   WBC 8.1 4.0 - 10.5 K/uL   RBC 3.06 (L) 3.87 - 5.11 MIL/uL   Hemoglobin 8.6 (L) 12.0 - 15.0 g/dL   HCT 28.6 (L) 36.0 - 46.0 %   MCV 93.5 80.0 - 100.0 fL   MCH 28.1 26.0 - 34.0 pg   MCHC 30.1 30.0 -  36.0 g/dL   RDW 17.5 (H) 11.5 - 15.5 %   Platelets 56 (L) 150 - 400 K/uL    Comment: Immature Platelet Fraction may be clinically indicated, consider ordering this additional test [id number]    nRBC 0.0 0.0 - 0.2 %    Comment: Performed at [facility], [address] [place], [place] [address]  Protime-INR - (order if patient is taking Coumadin / Warfarin)     Status: Abnormal   Collection Time: 10/18/18 11:36 PM  Result Value Ref Range   Prothrombin Time 21.7 (H) 11.4 - 15.2 seconds   INR 1.9 (H) 0.8 - 1.2    Comment: (NOTE) INR goal varies based on device and disease states. Performed at [facility], [address] [place], [place] [address]   Ammonia     Status: Abnormal   Collection Time: 10/18/18 11:36 PM  Result Value Ref Range   Ammonia 120 (H) 9 - 35 umol/L    Comment: ICTERUS AT THIS LEVEL MAY AFFECT RESULT Performed at [facility], [address] [place], [place] [address]   Ethanol     Status: Abnormal   Collection Time: 10/18/18 11:36 PM  Result Value Ref Range   Alcohol, Ethyl (B) 465 (HH) <10 mg/dL    Comment: CRITICAL RESULT CALLED TO, READ BACK BY AND VERIFIED WITH BRANDY DAVIS ON 10/19/18 AT 0011 [place] (NOTE) Lowest detectable limit for serum alcohol is 10 mg/dL. For medical purposes only. Performed at [facility], [address] [place], [place] [address]    No results found.  Pending Labs Unresulted Labs (From admission, onward)    Start     Ordered   10/19/18 0303  Urinalysis, Complete w Microscopic  Once,   STAT     10/19/18 0302   10/19/18 0124  SARS Coronavirus 2 (CEPHEID - Performed in [place] hospital lab), Hosp Order  (Asymptomatic Patients Labs)  Once,   STAT    Question:  Rule Out  Answer:  Yes   10/19/18 0123   Signed and Held  Ammonia  Tomorrow morning,   STAT     Signed and Held          Vitals/Pain Today's Vitals   10/19/18 0105 10/19/18 0140 10/19/18 0230 10/19/18 0302  BP:       Pulse: 98 90 85 83  Resp: 16 18  17   Temp:    98 F (36.7 C)  TempSrc:      SpO2: 97% 99% 99% 98%  Weight:      Height:  PainSc:        Isolation Precautions No active isolations  Medications Medications  0.9 %  sodium chloride infusion ( Intravenous New Bag/Given 10/19/18 0153)  lactulose (CHRONULAC) 10 GM/15ML solution 30 g (30 g Oral Given 10/19/18 0142)  ondansetron (ZOFRAN) injection 4 mg (4 mg Intravenous Given 10/19/18 0141)    Mobility walks with person assist High fall risk   Focused Assessments Neuro Assessment Handoff:  Swallow screen pass? N/A         Neuro Assessment: Exceptions to WDL Neuro Checks:      Last Documented NIHSS Modified Score:   Has TPA been given? No If patient is a Neuro Trauma and patient is going to OR before floor call report to [facility] nurse: [phone] or [phone]     R Recommendations: See Admitting Provider Note  Report given to:   Additional Notes:

## 2018-10-19 NOTE — Discharge Summary (Signed)
[facility] NAME: Lisa Dennis    MR#:  [id number]  DATE OF BIRTH:  October 31, 1965  DATE OF ADMISSION:  10/19/2018 ADMITTING PHYSICIAN: Lance Coon, MD  DATE OF DISCHARGE: 10/19/2018  PRIMARY CARE PHYSICIAN: Patient, No Pcp Per    ADMISSION DIAGNOSIS:  Hepatic encephalopathy ([place]) [H08.65] Alcoholic cirrhosis of liver with ascites ([place]) [K70.31]  DISCHARGE DIAGNOSIS:  Principal Problem:   Hepatic encephalopathy ([place]) Active Problems:   Alcoholic cirrhosis of liver without ascites ([place])   Alcoholic intoxication with complication ([place])   SECONDARY DIAGNOSIS:   Past Medical History:  Diagnosis Date  . Alcohol abuse   . Anxiety   . Cirrhosis [facility])     HOSPITAL COURSE:   53 year old female with past medical history of alcohol abuse, liver cirrhosis, anxiety who presented to the hospital due to altered mental status and noted to have hepatic encephalopathy.  1.  Hepatic encephalopathy-this was a source of patient's altered mental status.  Patient presented to the hospital with ammonia level greater than 120.  She was noncompliant with her lactulose. - Patient was restarted back on her lactulose and also given Xifaxan.  Ammonia level has trended down and is currently in the 40s.  Patient's mental status is back to baseline. -Patient will be discharged back on her Xifaxan and lactulose and she was given a new prescription.  2.  Urinary tract infection-this was based off her urinalysis on admission.  Patient does have a previous history of UTI with E. coli last month.  She was discharged on 3 days of oral ciprofloxacin.  3.  History of chronic renal disease secondary to alcoholic liver cirrhosis- patient will continue her lactulose, Xifaxan, Protonix.  She will continue her thiamine folate supplements.  She stable for discharge.  DISCHARGE CONDITIONS:   Stable  CONSULTS OBTAINED:    DRUG ALLERGIES:   Allergies   Allergen Reactions  . Benadryl [Diphenhydramine] Other (See Comments)    Reaction: makes crazy and can't sleep for days  . Latex Hives  . Morphine And Related Hives  . Penicillins Hives and Other (See Comments)    Did it involve swelling of the face/tongue/throat, SOB, or low BP? No Did it involve sudden or severe rash/hives, skin peeling, or any reaction on the inside of your mouth or nose? Unknown Did you need to seek medical attention at a hospital or doctor's office? Unknown When did it last happen?unknown If all above answers are "NO", may proceed with cephalosporin use.   . Sulfa Antibiotics Hives  . Tetracyclines & Related Hives    DISCHARGE MEDICATIONS:   Allergies as of 10/19/2018      Reactions   Benadryl [diphenhydramine] Other (See Comments)   Reaction: makes crazy and can't sleep for days   Latex Hives   Morphine And Related Hives   Penicillins Hives, Other (See Comments)   Did it involve swelling of the face/tongue/throat, SOB, or low BP? No Did it involve sudden or severe rash/hives, skin peeling, or any reaction on the inside of your mouth or nose? Unknown Did you need to seek medical attention at a hospital or doctor's office? Unknown When did it last happen?unknown If all above answers are "NO", may proceed with cephalosporin use.   Sulfa Antibiotics Hives   Tetracyclines & Related Hives      Medication List    TAKE these medications   ciprofloxacin 250 MG tablet Commonly known as: CIPRO Take 1 tablet (250 mg  total) by mouth 2 (two) times daily for 3 days.   folic acid 1 MG tablet Commonly known as: FOLVITE Take 1 tablet (1 mg total) by mouth daily.   lactulose 10 GM/15ML solution Commonly known as: CHRONULAC 20gm oral twice a day (goal is 3 bowel movements a day, can go to three or four times a day dosing if needed)   pantoprazole 40 MG tablet Commonly known as: Protonix Take 1 tablet (40 mg total) by mouth daily.   rifaximin 550 MG Tabs  tablet Commonly known as: XIFAXAN Take 1 tablet (550 mg total) by mouth 2 (two) times daily.   thiamine 100 MG tablet Take 1 tablet (100 mg total) by mouth daily.         DISCHARGE INSTRUCTIONS:   DIET:  Regular diet  DISCHARGE CONDITION:  Stable  ACTIVITY:  Activity as tolerated  OXYGEN:  Home Oxygen: No.   Oxygen Delivery: room air  DISCHARGE LOCATION:  home   If you experience worsening of your admission symptoms, develop shortness of breath, life threatening emergency, suicidal or homicidal thoughts you must seek medical attention immediately by calling 911 or calling your MD immediately  if symptoms less severe.  You Must read complete instructions/literature along with all the possible adverse reactions/side effects for all the Medicines you take and that have been prescribed to you. Take any new Medicines after you have completely understood and accpet all the possible adverse reactions/side effects.   Please note  You were cared for by a hospitalist during your hospital stay. If you have any questions about your discharge medications or the care you received while you were in the hospital after you are discharged, you can call the unit and asked to speak with the hospitalist on call if the hospitalist that took care of you is not available. Once you are discharged, your primary care physician will handle any further medical issues. Please note that NO REFILLS for any discharge medications will be authorized once you are discharged, as it is imperative that you return to your primary care physician (or establish a relationship with a primary care physician if you do not have one) for your aftercare needs so that they can reassess your need for medications and monitor your lab values.     Today   Mental status back to baseline.  No complaints. No flapping tremor.  No dysuria, frequency.  VITAL SIGNS:  Blood pressure 121/79, pulse (!) 102, temperature 98.3 F (36.8  C), temperature source Oral, resp. rate 17, height 5\' 1"  (1.549 m), weight 45.4 kg, SpO2 99 %.  I/O:    Intake/Output Summary (Last 24 hours) at 10/19/2018 1447 Last data filed at 10/19/2018 0900 Gross per 24 hour  Intake 0 ml  Output 100 ml  Net -100 ml    PHYSICAL EXAMINATION:  GENERAL:  53 y.o.-year-old patient lying in the bed with no acute distress.  EYES: Pupils equal, round, reactive to light and accommodation. + scleral icterus. Extraocular muscles intact.  HEENT: Head atraumatic, normocephalic. Oropharynx and nasopharynx clear.  NECK:  Supple, no jugular venous distention. No thyroid enlargement, no tenderness.  LUNGS: Normal breath sounds bilaterally, no wheezing, rales,rhonchi. No use of accessory muscles of respiration.  CARDIOVASCULAR: S1, S2 normal. No murmurs, rubs, or gallops.  ABDOMEN: Soft, non-tender, non-distended. Bowel sounds present. No organomegaly or mass.  EXTREMITIES: No pedal edema, cyanosis, or clubbing.  NEUROLOGIC: Cranial nerves II through XII are intact. No focal motor or sensory defecits b/l.  PSYCHIATRIC: The patient is alert and oriented x 3.  SKIN: No obvious rash, lesion, or ulcer.   DATA REVIEW:   CBC Recent Labs  Lab 10/18/18 2336  WBC 8.1  HGB 8.6*  HCT 28.6*  PLT 56*    Chemistries  Recent Labs  Lab 10/18/18 2336  NA 138  K 3.4*  CL 102  CO2 26  GLUCOSE 174*  BUN 8  CREATININE 0.56  CALCIUM 8.2*  AST 126*  ALT 38  ALKPHOS 99  BILITOT 9.1*    Cardiac Enzymes No results for input(s): TROPONINI in the last 168 hours.  Microbiology Results  Results for orders placed or performed during the hospital encounter of 10/19/18  SARS Coronavirus 2 (CEPHEID - Performed in [facility] hospital lab), Hosp Order     Status: None   Collection Time: 10/19/18  1:54 AM   Specimen: Nasopharyngeal Swab  Result Value Ref Range Status   SARS Coronavirus 2 NEGATIVE NEGATIVE Final    Comment: (NOTE) If result is NEGATIVE SARS-CoV-2  target nucleic acids are NOT DETECTED. The SARS-CoV-2 RNA is generally detectable in upper and lower  respiratory specimens during the acute phase of infection. The lowest  concentration of SARS-CoV-2 viral copies this assay can detect is 250  copies / mL. A negative result does not preclude SARS-CoV-2 infection  and should not be used as the sole basis for treatment or other  patient management decisions.  A negative result may occur with  improper specimen collection / handling, submission of specimen other  than nasopharyngeal swab, presence of viral mutation(s) within the  areas targeted by this assay, and inadequate number of viral copies  (<250 copies / mL). A negative result must be combined with clinical  observations, patient history, and epidemiological information. If result is POSITIVE SARS-CoV-2 target nucleic acids are DETECTED. The SARS-CoV-2 RNA is generally detectable in upper and lower  respiratory specimens dur ing the acute phase of infection.  Positive  results are indicative of active infection with SARS-CoV-2.  Clinical  correlation with patient history and other diagnostic information is  necessary to determine patient infection status.  Positive results do  not rule out bacterial infection or co-infection with other viruses. If result is PRESUMPTIVE POSTIVE SARS-CoV-2 nucleic acids MAY BE PRESENT.   A presumptive positive result was obtained on the submitted specimen  and confirmed on repeat testing.  While 2019 novel coronavirus  (SARS-CoV-2) nucleic acids may be present in the submitted sample  additional confirmatory testing may be necessary for epidemiological  and / or clinical management purposes  to differentiate between  SARS-CoV-2 and other Sarbecovirus currently known to infect humans.  If clinically indicated additional testing with an alternate test  methodology [phone]) is advised. The SARS-CoV-2 RNA is generally  detectable in upper and lower  respiratory sp ecimens during the acute  phase of infection. The expected result is Negative. Fact Sheet for Patients:  StrictlyIdeas.no Fact Sheet for Healthcare Providers: BankingDealers.co.za This test is not yet approved or cleared by the [place] FDA and has been authorized for detection and/or diagnosis of SARS-CoV-2 by FDA under an Emergency Use Authorization (EUA).  This EUA will remain in effect (meaning this test can be used) for the duration of the COVID-19 declaration under Section 564(b)(1) of the Act, 21 U.S.C. section 360bbb-3(b)(1), unless the authorization is terminated or revoked sooner. Performed at [facility], [address], [place], [place] [address]     RADIOLOGY:  No results found.  Management plans discussed with the patient, family and they are in agreement.  CODE STATUS:     Code Status Orders  (From admission, onward)         Start     Ordered   10/19/18 0346  Full code  Continuous     10/19/18 0345        TOTAL TIME TAKING CARE OF THIS PATIENT: 40 minutes.    Henreitta Leber M.D on 10/19/2018 at 2:47 PM  Between 7am to 6pm - Pager - [phone]  After 6pm go to [email or web address] - Proofreader  [facility]  Office  [phone]  CC: Primary care physician; Patient, No Pcp Per

## 2018-10-19 NOTE — ED Notes (Signed)
ED Provider at bedside. 

## 2018-10-21 ENCOUNTER — Other Ambulatory Visit: Payer: Self-pay

## 2018-10-21 ENCOUNTER — Ambulatory Visit
Admission: RE | Admit: 2018-10-21 | Discharge: 2018-10-21 | Disposition: A | Discharge status: Home / Self Care | Payer: BLUE CROSS/BLUE SHIELD | Source: Ambulatory Visit | Attending: Gastroenterology | Admitting: Gastroenterology

## 2018-10-21 DIAGNOSIS — K760 Fatty (change of) liver, not elsewhere classified: Secondary | ICD-10-CM | POA: Diagnosis not present

## 2018-10-21 MED ORDER — GADOBUTROL 1 MMOL/ML IV SOLN
4.0000 mL | Freq: Once | INTRAVENOUS | Status: AC | PRN
  Administered 2018-10-21: 4 mL via INTRAVENOUS

## 2018-10-27 ENCOUNTER — Telehealth: Payer: Self-pay

## 2018-10-27 NOTE — Telephone Encounter (Signed)
-----   Message from Lucilla Lame, MD sent at 10/26/2018  9:34 AM EDT ----- Let the patient know that the MRI showed some small spots that could not be determined exactly what they are. They recommended a repeat MRI with contrast in 6 months, I think this is a good idea and we should recheck the AFP in 3 months to see if it is increasing. Thanks Lucilla Lame ----- Message ----- From: Buel Ream, Rad Results In Sent: 10/21/2018  12:41 PM EDT To: Lucilla Lame, MD

## 2018-10-27 NOTE — Telephone Encounter (Signed)
Pt notified of MRI results. AFP will be ordered in November.

## 2018-11-11 ENCOUNTER — Inpatient Hospital Stay: Payer: BLUE CROSS/BLUE SHIELD

## 2018-11-11 ENCOUNTER — Emergency Department: Payer: BLUE CROSS/BLUE SHIELD

## 2018-11-11 ENCOUNTER — Inpatient Hospital Stay
Admission: EM | Admit: 2018-11-11 | Discharge: 2018-11-13 | DRG: 432 | Disposition: A | Discharge status: Home / Self Care | Payer: BLUE CROSS/BLUE SHIELD | Attending: Internal Medicine | Admitting: Internal Medicine

## 2018-11-11 ENCOUNTER — Other Ambulatory Visit: Payer: Self-pay

## 2018-11-11 DIAGNOSIS — R579 Shock, unspecified: Secondary | ICD-10-CM

## 2018-11-11 DIAGNOSIS — Z888 Allergy status to other drugs, medicaments and biological substances status: Secondary | ICD-10-CM

## 2018-11-11 DIAGNOSIS — J9601 Acute respiratory failure with hypoxia: Secondary | ICD-10-CM | POA: Diagnosis not present

## 2018-11-11 DIAGNOSIS — K7031 Alcoholic cirrhosis of liver with ascites: Principal | ICD-10-CM | POA: Diagnosis present

## 2018-11-11 DIAGNOSIS — K7030 Alcoholic cirrhosis of liver without ascites: Secondary | ICD-10-CM

## 2018-11-11 DIAGNOSIS — R188 Other ascites: Secondary | ICD-10-CM

## 2018-11-11 DIAGNOSIS — K922 Gastrointestinal hemorrhage, unspecified: Secondary | ICD-10-CM

## 2018-11-11 DIAGNOSIS — E876 Hypokalemia: Secondary | ICD-10-CM | POA: Diagnosis present

## 2018-11-11 DIAGNOSIS — D684 Acquired coagulation factor deficiency: Secondary | ICD-10-CM | POA: Diagnosis present

## 2018-11-11 DIAGNOSIS — E872 Acidosis: Secondary | ICD-10-CM | POA: Diagnosis present

## 2018-11-11 DIAGNOSIS — R402232 Coma scale, best verbal response, inappropriate words, at arrival to emergency department: Secondary | ICD-10-CM | POA: Diagnosis present

## 2018-11-11 DIAGNOSIS — R402342 Coma scale, best motor response, flexion withdrawal, at arrival to emergency department: Secondary | ICD-10-CM | POA: Diagnosis present

## 2018-11-11 DIAGNOSIS — D689 Coagulation defect, unspecified: Secondary | ICD-10-CM

## 2018-11-11 DIAGNOSIS — K7201 Acute and subacute hepatic failure with coma: Secondary | ICD-10-CM | POA: Diagnosis not present

## 2018-11-11 DIAGNOSIS — R571 Hypovolemic shock: Secondary | ICD-10-CM | POA: Diagnosis present

## 2018-11-11 DIAGNOSIS — D696 Thrombocytopenia, unspecified: Secondary | ICD-10-CM | POA: Diagnosis not present

## 2018-11-11 DIAGNOSIS — I8511 Secondary esophageal varices with bleeding: Secondary | ICD-10-CM | POA: Diagnosis present

## 2018-11-11 DIAGNOSIS — R680 Hypothermia, not associated with low environmental temperature: Secondary | ICD-10-CM | POA: Diagnosis present

## 2018-11-11 DIAGNOSIS — Z882 Allergy status to sulfonamides status: Secondary | ICD-10-CM

## 2018-11-11 DIAGNOSIS — N179 Acute kidney failure, unspecified: Secondary | ICD-10-CM | POA: Diagnosis present

## 2018-11-11 DIAGNOSIS — G9341 Metabolic encephalopathy: Secondary | ICD-10-CM | POA: Diagnosis present

## 2018-11-11 DIAGNOSIS — Z87891 Personal history of nicotine dependence: Secondary | ICD-10-CM | POA: Diagnosis not present

## 2018-11-11 DIAGNOSIS — E46 Unspecified protein-calorie malnutrition: Secondary | ICD-10-CM | POA: Diagnosis present

## 2018-11-11 DIAGNOSIS — J9690 Respiratory failure, unspecified, unspecified whether with hypoxia or hypercapnia: Secondary | ICD-10-CM

## 2018-11-11 DIAGNOSIS — Z881 Allergy status to other antibiotic agents status: Secondary | ICD-10-CM

## 2018-11-11 DIAGNOSIS — Z885 Allergy status to narcotic agent status: Secondary | ICD-10-CM | POA: Diagnosis not present

## 2018-11-11 DIAGNOSIS — K766 Portal hypertension: Secondary | ICD-10-CM | POA: Diagnosis present

## 2018-11-11 DIAGNOSIS — R578 Other shock: Secondary | ICD-10-CM

## 2018-11-11 DIAGNOSIS — R509 Fever, unspecified: Secondary | ICD-10-CM

## 2018-11-11 DIAGNOSIS — K7290 Hepatic failure, unspecified without coma: Secondary | ICD-10-CM | POA: Diagnosis not present

## 2018-11-11 DIAGNOSIS — I998 Other disorder of circulatory system: Secondary | ICD-10-CM | POA: Diagnosis not present

## 2018-11-11 DIAGNOSIS — Z9911 Dependence on respirator [ventilator] status: Secondary | ICD-10-CM | POA: Diagnosis not present

## 2018-11-11 DIAGNOSIS — K920 Hematemesis: Secondary | ICD-10-CM | POA: Diagnosis present

## 2018-11-11 DIAGNOSIS — Z6824 Body mass index (BMI) 24.0-24.9, adult: Secondary | ICD-10-CM

## 2018-11-11 DIAGNOSIS — Z91040 Latex allergy status: Secondary | ICD-10-CM

## 2018-11-11 DIAGNOSIS — R402122 Coma scale, eyes open, to pain, at arrival to emergency department: Secondary | ICD-10-CM | POA: Diagnosis present

## 2018-11-11 DIAGNOSIS — R140 Abdominal distension (gaseous): Secondary | ICD-10-CM

## 2018-11-11 DIAGNOSIS — K7011 Alcoholic hepatitis with ascites: Secondary | ICD-10-CM | POA: Diagnosis present

## 2018-11-11 DIAGNOSIS — Z20828 Contact with and (suspected) exposure to other viral communicable diseases: Secondary | ICD-10-CM | POA: Diagnosis present

## 2018-11-11 DIAGNOSIS — R945 Abnormal results of liver function studies: Secondary | ICD-10-CM

## 2018-11-11 DIAGNOSIS — F1010 Alcohol abuse, uncomplicated: Secondary | ICD-10-CM | POA: Diagnosis present

## 2018-11-11 DIAGNOSIS — D62 Acute posthemorrhagic anemia: Secondary | ICD-10-CM | POA: Diagnosis present

## 2018-11-11 DIAGNOSIS — K2921 Alcoholic gastritis with bleeding: Secondary | ICD-10-CM | POA: Diagnosis not present

## 2018-11-11 DIAGNOSIS — R7989 Other specified abnormal findings of blood chemistry: Secondary | ICD-10-CM

## 2018-11-11 LAB — BASIC METABOLIC PANEL
Anion gap: 11 (ref 5–15)
BUN: 8 mg/dL (ref 6–20)
CO2: 16 mmol/L — ABNORMAL LOW (ref 22–32)
Calcium: 6.6 mg/dL — ABNORMAL LOW (ref 8.9–10.3)
Chloride: 108 mmol/L (ref 98–111)
Creatinine, Ser: 0.79 mg/dL (ref 0.44–1.00)
GFR calc Af Amer: >60 mL/min (ref >60)
GFR calc non Af Amer: >60 mL/min (ref >60)
Glucose, Bld: 299 mg/dL — ABNORMAL HIGH (ref 70–99)
Potassium: 4.3 mmol/L (ref 3.5–5.1)
Sodium: 135 mmol/L (ref 135–145)

## 2018-11-11 LAB — BLOOD GAS, ARTERIAL
Acid-base deficit: 11.9 mmol/L — ABNORMAL HIGH (ref 0.0–2.0)
Acid-base deficit: 8.4 mmol/L — ABNORMAL HIGH (ref 0.0–2.0)
Bicarbonate: 12.6 mmol/L — ABNORMAL LOW (ref 20.0–28.0)
Bicarbonate: 17.2 mmol/L — ABNORMAL LOW (ref 20.0–28.0)
FIO2: 1
FIO2: 28
MECHVT: 500 mL
MECHVT: 500 mL
Mechanical Rate: 20
O2 Saturation: 100 %
O2 Saturation: 95.1 %
PEEP: 5 cmH2O
PEEP: 5 cmH2O
Patient temperature: 37
Patient temperature: 37
RATE: 16 resp/min
pCO2 arterial: 25 mmHg — ABNORMAL LOW (ref 32.0–48.0)
pCO2 arterial: 35 mmHg (ref 32.0–48.0)
pH, Arterial: 7.31 — ABNORMAL LOW (ref 7.350–7.450)
pH, Arterial: 7.3 — ABNORMAL LOW (ref 7.350–7.450)
pO2, Arterial: 385 mmHg — ABNORMAL HIGH (ref 83.0–108.0)
pO2, Arterial: 84 mmHg (ref 83.0–108.0)

## 2018-11-11 LAB — CBC WITH DIFFERENTIAL/PLATELET
Abs Immature Granulocytes: 0.03 10*3/uL (ref 0.00–0.07)
Basophils Absolute: 0 10*3/uL (ref 0.0–0.1)
Basophils Relative: 1 %
Eosinophils Absolute: 0.1 10*3/uL (ref 0.0–0.5)
Eosinophils Relative: 2 %
HCT: 13 % — CL (ref 36.0–46.0)
Hemoglobin: 3.8 g/dL — CL (ref 12.0–15.0)
Immature Granulocytes: 1 %
Lymphocytes Relative: 48 %
Lymphs Abs: 2.7 10*3/uL (ref 0.7–4.0)
MCH: 29.9 pg (ref 26.0–34.0)
MCHC: 29.2 g/dL — ABNORMAL LOW (ref 30.0–36.0)
MCV: 102.4 fL — ABNORMAL HIGH (ref 80.0–100.0)
Monocytes Absolute: 0.3 10*3/uL (ref 0.1–1.0)
Monocytes Relative: 5 %
Neutro Abs: 2.4 10*3/uL (ref 1.7–7.7)
Neutrophils Relative %: 43 %
Platelets: 58 10*3/uL — ABNORMAL LOW (ref 150–400)
RBC: 1.27 MIL/uL — ABNORMAL LOW (ref 3.87–5.11)
RDW: 22.1 % — ABNORMAL HIGH (ref 11.5–15.5)
WBC: 5.5 10*3/uL (ref 4.0–10.5)
nRBC: 0 % (ref 0.0–0.2)

## 2018-11-11 LAB — URINALYSIS, COMPLETE (UACMP) WITH MICROSCOPIC
Bilirubin Urine: NEGATIVE
Glucose, UA: 150 mg/dL — AB
Hgb urine dipstick: NEGATIVE
Ketones, ur: NEGATIVE mg/dL
Leukocytes,Ua: NEGATIVE
Nitrite: NEGATIVE
Protein, ur: 100 mg/dL — AB
Specific Gravity, Urine: 1.014 (ref 1.005–1.030)
pH: 7 (ref 5.0–8.0)

## 2018-11-11 LAB — COMPREHENSIVE METABOLIC PANEL
ALT: 20 U/L (ref 0–44)
AST: 73 U/L — ABNORMAL HIGH (ref 15–41)
Albumin: 1.6 g/dL — ABNORMAL LOW (ref 3.5–5.0)
Alkaline Phosphatase: 46 U/L (ref 38–126)
Anion gap: 8 (ref 5–15)
BUN: 6 mg/dL (ref 6–20)
CO2: 19 mmol/L — ABNORMAL LOW (ref 22–32)
Calcium: 7 mg/dL — ABNORMAL LOW (ref 8.9–10.3)
Chloride: 114 mmol/L — ABNORMAL HIGH (ref 98–111)
Creatinine, Ser: 0.5 mg/dL (ref 0.44–1.00)
GFR calc Af Amer: >60 mL/min (ref >60)
GFR calc non Af Amer: >60 mL/min (ref >60)
Glucose, Bld: 130 mg/dL — ABNORMAL HIGH (ref 70–99)
Potassium: 3.2 mmol/L — ABNORMAL LOW (ref 3.5–5.1)
Sodium: 141 mmol/L (ref 135–145)
Total Bilirubin: 3.3 mg/dL — ABNORMAL HIGH (ref 0.3–1.2)
Total Protein: 4.2 g/dL — ABNORMAL LOW (ref 6.5–8.1)

## 2018-11-11 LAB — GLUCOSE, CAPILLARY
Glucose-Capillary: 266 mg/dL — ABNORMAL HIGH (ref 70–99)
Glucose-Capillary: 157 mg/dL — ABNORMAL HIGH (ref 70–99)
Glucose-Capillary: 143 mg/dL — ABNORMAL HIGH (ref 70–99)
Glucose-Capillary: 129 mg/dL — ABNORMAL HIGH (ref 70–99)
Glucose-Capillary: 96 mg/dL (ref 70–99)

## 2018-11-11 LAB — ACETAMINOPHEN LEVEL: Acetaminophen (Tylenol), Serum: <10 ug/mL — ABNORMAL LOW (ref 10–30)

## 2018-11-11 LAB — CBC
HCT: 33.6 % — ABNORMAL LOW (ref 36.0–46.0)
Hemoglobin: 11.9 g/dL — ABNORMAL LOW (ref 12.0–15.0)
MCH: 30.5 pg (ref 26.0–34.0)
MCHC: 35.4 g/dL (ref 30.0–36.0)
MCV: 86.2 fL (ref 80.0–100.0)
Platelets: 49 10*3/uL — ABNORMAL LOW (ref 150–400)
RBC: 3.9 MIL/uL (ref 3.87–5.11)
RDW: 14.9 % (ref 11.5–15.5)
WBC: 18.4 10*3/uL — ABNORMAL HIGH (ref 4.0–10.5)
nRBC: 0 % (ref 0.0–0.2)

## 2018-11-11 LAB — PROCALCITONIN: Procalcitonin: <0.1 ng/mL

## 2018-11-11 LAB — AMMONIA: Ammonia: 68 umol/L — ABNORMAL HIGH (ref 9–35)

## 2018-11-11 LAB — PREPARE RBC (CROSSMATCH)

## 2018-11-11 LAB — PROTIME-INR
INR: 2.5 — ABNORMAL HIGH (ref 0.8–1.2)
Prothrombin Time: 26.6 seconds — ABNORMAL HIGH (ref 11.4–15.2)

## 2018-11-11 LAB — LACTIC ACID, PLASMA
Lactic Acid, Venous: 7.1 mmol/L (ref 0.5–1.9)
Lactic Acid, Venous: 5.5 mmol/L (ref 0.5–1.9)

## 2018-11-11 LAB — MRSA PCR SCREENING: MRSA by PCR: POSITIVE — AB

## 2018-11-11 LAB — SARS CORONAVIRUS 2 BY RT PCR (HOSPITAL ORDER, PERFORMED IN ~~LOC~~ HOSPITAL LAB): SARS Coronavirus 2: NEGATIVE

## 2018-11-11 LAB — APTT: aPTT: 44 seconds — ABNORMAL HIGH (ref 24–36)

## 2018-11-11 MED ORDER — DEXMEDETOMIDINE HCL IN NACL 400 MCG/100ML IV SOLN
0.4000 ug/kg/h | INTRAVENOUS | Status: DC
  Filled 2018-11-11: qty 100

## 2018-11-11 MED ORDER — SODIUM CHLORIDE 0.9 % IV SOLN
8.0000 mg/h | INTRAVENOUS | Status: DC
  Administered 2018-11-11 – 2018-11-12 (×3): 8 mg/h via INTRAVENOUS
  Filled 2018-11-11 (×3): qty 80

## 2018-11-11 MED ORDER — SODIUM CHLORIDE 0.9% IV SOLUTION
Freq: Once | INTRAVENOUS | Status: AC
  Administered 2018-11-11: 08:00:00 via INTRAVENOUS

## 2018-11-11 MED ORDER — FOLIC ACID 1 MG PO TABS: 1.0000 mg | ORAL_TABLET | Freq: Every day | ORAL | Status: DC

## 2018-11-11 MED ORDER — SODIUM CHLORIDE 0.9 % IV SOLN
80.0000 mg | Freq: Once | INTRAVENOUS | Status: AC
  Administered 2018-11-11: 80 mg via INTRAVENOUS
  Filled 2018-11-11: qty 80

## 2018-11-11 MED ORDER — NOREPINEPHRINE 16 MG/250ML-% IV SOLN
0.0000 ug/min | INTRAVENOUS | Status: DC
  Administered 2018-11-11: 07:00:00 20 ug/min via INTRAVENOUS
  Filled 2018-11-11: qty 250

## 2018-11-11 MED ORDER — NOREPINEPHRINE 4 MG/250ML-% IV SOLN
0.0000 ug/min | INTRAVENOUS | Status: DC
  Administered 2018-11-11: 5 ug/min via INTRAVENOUS

## 2018-11-11 MED ORDER — VITAMIN K1 10 MG/ML IJ SOLN
10.0000 mg | Freq: Once | INTRAVENOUS | Status: AC
  Administered 2018-11-11: 10 mg via INTRAVENOUS
  Filled 2018-11-11: qty 1

## 2018-11-11 MED ORDER — SODIUM CHLORIDE 0.9 % IV SOLN: 2.0000 g | Freq: Two times a day (BID) | INTRAVENOUS | Status: DC

## 2018-11-11 MED ORDER — SODIUM CHLORIDE 0.9 % IV SOLN: INTRAVENOUS | Status: DC

## 2018-11-11 MED ORDER — RIFAXIMIN 550 MG PO TABS: 550.0000 mg | ORAL_TABLET | Freq: Two times a day (BID) | ORAL | Status: DC

## 2018-11-11 MED ORDER — POTASSIUM CHLORIDE 10 MEQ/100ML IV SOLN
10.0000 meq | INTRAVENOUS | Status: AC
  Administered 2018-11-11 (×4): 10 meq via INTRAVENOUS
  Filled 2018-11-11 (×4): qty 100

## 2018-11-11 MED ORDER — ACETAMINOPHEN 325 MG PO TABS: 650.0000 mg | ORAL_TABLET | Freq: Four times a day (QID) | ORAL | Status: DC | PRN

## 2018-11-11 MED ORDER — SODIUM CHLORIDE 0.9 % IV SOLN: 50.0000 ug/h | INTRAVENOUS | Status: DC

## 2018-11-11 MED ORDER — ONDANSETRON HCL 4 MG/2ML IJ SOLN
INTRAMUSCULAR | Status: AC
  Filled 2018-11-11: qty 2

## 2018-11-11 MED ORDER — SODIUM CHLORIDE 0.9 % IV SOLN
1.0000 g | Freq: Once | INTRAVENOUS | Status: AC
  Administered 2018-11-11: 1 g via INTRAVENOUS
  Filled 2018-11-11: qty 10

## 2018-11-11 MED ORDER — CHLORHEXIDINE GLUCONATE 0.12% ORAL RINSE (MEDLINE KIT)
15.0000 mL | Freq: Two times a day (BID) | OROMUCOSAL | Status: DC
  Administered 2018-11-11 (×2): 15 mL via OROMUCOSAL

## 2018-11-11 MED ORDER — LORAZEPAM 2 MG/ML IJ SOLN: 2.0000 mg | INTRAMUSCULAR | Status: DC | PRN

## 2018-11-11 MED ORDER — PANTOPRAZOLE SODIUM 40 MG IV SOLR: 40.0000 mg | Freq: Two times a day (BID) | INTRAVENOUS | Status: DC

## 2018-11-11 MED ORDER — VANCOMYCIN HCL IN DEXTROSE 1-5 GM/200ML-% IV SOLN
1000.0000 mg | Freq: Once | INTRAVENOUS | Status: AC
  Administered 2018-11-11: 04:00:00 1000 mg via INTRAVENOUS
  Filled 2018-11-11: qty 200

## 2018-11-11 MED ORDER — OCTREOTIDE LOAD VIA INFUSION
50.0000 ug | Freq: Once | INTRAVENOUS | Status: AC
  Administered 2018-11-11: 50 ug via INTRAVENOUS
  Filled 2018-11-11: qty 25

## 2018-11-11 MED ORDER — ORAL CARE MOUTH RINSE
15.0000 mL | OROMUCOSAL | Status: DC
  Administered 2018-11-11 – 2018-11-12 (×9): 15 mL via OROMUCOSAL

## 2018-11-11 MED ORDER — PROPOFOL 1000 MG/100ML IV EMUL
0.0000 ug/kg/min | INTRAVENOUS | Status: DC
  Administered 2018-11-11: 17:00:00 20 ug/kg/min via INTRAVENOUS
  Administered 2018-11-12: 70 ug/kg/min via INTRAVENOUS
  Administered 2018-11-12: 40 ug/kg/min via INTRAVENOUS
  Administered 2018-11-12: 50 ug/kg/min via INTRAVENOUS
  Filled 2018-11-11 (×3): qty 100

## 2018-11-11 MED ORDER — KETAMINE HCL 10 MG/ML IJ SOLN
INTRAMUSCULAR | Status: AC | PRN
  Administered 2018-11-11 (×2): 70 mg via INTRAVENOUS

## 2018-11-11 MED ORDER — VITAMIN B-1 100 MG PO TABS: 100.0000 mg | ORAL_TABLET | Freq: Every day | ORAL | Status: DC

## 2018-11-11 MED ORDER — FENTANYL CITRATE (PF) 100 MCG/2ML IJ SOLN
50.0000 ug | INTRAMUSCULAR | Status: DC | PRN
  Administered 2018-11-11: 100 ug via INTRAVENOUS
  Administered 2018-11-12: 08:00:00 200 ug via INTRAVENOUS
  Administered 2018-11-12 (×3): 100 ug via INTRAVENOUS
  Filled 2018-11-11 (×4): qty 2
  Filled 2018-11-11: qty 4

## 2018-11-11 MED ORDER — EPINEPHRINE PF 1 MG/ML IJ SOLN
5.0000 ug/min | INTRAVENOUS | Status: DC
  Filled 2018-11-11: qty 4

## 2018-11-11 MED ORDER — SODIUM CHLORIDE 0.9% IV SOLUTION: Freq: Once | INTRAVENOUS | Status: DC

## 2018-11-11 MED ORDER — SODIUM CHLORIDE 0.9 % IV SOLN
2.0000 g | Freq: Three times a day (TID) | INTRAVENOUS | Status: DC
  Administered 2018-11-11: 07:00:00 2 g via INTRAVENOUS
  Filled 2018-11-11 (×3): qty 2

## 2018-11-11 MED ORDER — SODIUM CHLORIDE 0.9% FLUSH: 10.0000 mL | INTRAVENOUS | Status: DC | PRN

## 2018-11-11 MED ORDER — THIAMINE HCL 100 MG/ML IJ SOLN
Freq: Every day | INTRAVENOUS | Status: AC
  Administered 2018-11-11 – 2018-11-13 (×3): via INTRAVENOUS
  Filled 2018-11-11 (×4): qty 1000

## 2018-11-11 MED ORDER — SODIUM CHLORIDE 0.9 % IV SOLN
50.0000 ug/h | INTRAVENOUS | Status: DC
  Administered 2018-11-11 – 2018-11-13 (×5): 50 ug/h via INTRAVENOUS
  Filled 2018-11-11 (×13): qty 1

## 2018-11-11 MED ORDER — MIDAZOLAM HCL 2 MG/2ML IJ SOLN
2.0000 mg | INTRAMUSCULAR | Status: DC | PRN
  Administered 2018-11-11 (×2): 2 mg via INTRAVENOUS
  Filled 2018-11-11 (×2): qty 2

## 2018-11-11 MED ORDER — CHLORHEXIDINE GLUCONATE CLOTH 2 % EX PADS
6.0000 | MEDICATED_PAD | Freq: Every day | CUTANEOUS | Status: DC
  Administered 2018-11-11 – 2018-11-13 (×3): 6 via TOPICAL
  Filled 2018-11-11: qty 6

## 2018-11-11 MED ORDER — METRONIDAZOLE IN NACL 5-0.79 MG/ML-% IV SOLN
500.0000 mg | Freq: Three times a day (TID) | INTRAVENOUS | Status: DC
  Administered 2018-11-11: 08:00:00 500 mg via INTRAVENOUS
  Filled 2018-11-11 (×3): qty 100

## 2018-11-11 MED ORDER — VITAMIN K1 10 MG/ML IJ SOLN
10.0000 mg | Freq: Every day | INTRAVENOUS | Status: DC
  Administered 2018-11-13: 12:00:00 10 mg via INTRAVENOUS
  Filled 2018-11-11 (×4): qty 1

## 2018-11-11 MED ORDER — NOREPINEPHRINE BITARTRATE 1 MG/ML IV SOLN
0.0000 ug/min | INTRAVENOUS | Status: DC
  Filled 2018-11-11: qty 4

## 2018-11-11 MED ORDER — ONDANSETRON HCL 4 MG/2ML IJ SOLN
4.0000 mg | Freq: Four times a day (QID) | INTRAMUSCULAR | Status: DC | PRN
  Administered 2018-11-12 – 2018-11-13 (×2): 4 mg via INTRAVENOUS
  Filled 2018-11-11 (×2): qty 2

## 2018-11-11 MED ORDER — ONDANSETRON HCL 4 MG PO TABS: 4.0000 mg | ORAL_TABLET | Freq: Four times a day (QID) | ORAL | Status: DC | PRN

## 2018-11-11 MED ORDER — LACTULOSE 10 GM/15ML PO SOLN: 20.0000 g | Freq: Three times a day (TID) | ORAL | Status: DC

## 2018-11-11 MED ORDER — VASOPRESSIN 20 UNIT/ML IV SOLN
0.4000 [IU]/min | INTRAVENOUS | Status: DC
  Administered 2018-11-11 (×2): 0.4 [IU]/min via INTRAVENOUS
  Filled 2018-11-11 (×2): qty 5

## 2018-11-11 MED ORDER — INSULIN ASPART 100 UNIT/ML ~~LOC~~ SOLN
0.0000 [IU] | SUBCUTANEOUS | Status: DC
  Administered 2018-11-11 (×2): 1 [IU] via SUBCUTANEOUS
  Filled 2018-11-11 (×2): qty 1

## 2018-11-11 MED ORDER — PROPOFOL 1000 MG/100ML IV EMUL
INTRAVENOUS | Status: AC
  Administered 2018-11-11: 17:00:00 20 ug/kg/min via INTRAVENOUS
  Filled 2018-11-11: qty 100

## 2018-11-11 MED ORDER — SODIUM CHLORIDE 0.9 % IV SOLN
10.0000 mL/h | Freq: Once | INTRAVENOUS | Status: AC
  Administered 2018-11-11: 10 mL/h via INTRAVENOUS

## 2018-11-11 MED ORDER — TRANEXAMIC ACID-NACL 1000-0.7 MG/100ML-% IV SOLN
1000.0000 mg | INTRAVENOUS | Status: AC
  Administered 2018-11-11: 02:00:00 1000 mg via INTRAVENOUS
  Filled 2018-11-11: qty 100

## 2018-11-11 MED ORDER — TRAZODONE HCL 50 MG PO TABS: 25.0000 mg | ORAL_TABLET | Freq: Every evening | ORAL | Status: DC | PRN

## 2018-11-11 MED ORDER — NOREPINEPHRINE 4 MG/250ML-% IV SOLN
0.0000 ug/min | INTRAVENOUS | Status: DC
  Filled 2018-11-11 (×2): qty 250

## 2018-11-11 MED ORDER — THIAMINE HCL 100 MG/ML IJ SOLN
500.0000 mg | Freq: Every day | INTRAVENOUS | Status: DC
  Administered 2018-11-11 – 2018-11-13 (×2): 500 mg via INTRAVENOUS
  Filled 2018-11-11 (×4): qty 5

## 2018-11-11 MED ORDER — VASOPRESSIN 20 UNIT/ML IV SOLN
0.0300 [IU]/min | INTRAVENOUS | Status: DC
  Filled 2018-11-11: qty 2

## 2018-11-11 MED ORDER — ACETAMINOPHEN 650 MG RE SUPP: 650.0000 mg | Freq: Four times a day (QID) | RECTAL | Status: DC | PRN

## 2018-11-11 MED ORDER — SODIUM CHLORIDE 0.9 % IV SOLN: 10.0000 mL/h | Freq: Once | INTRAVENOUS | Status: DC

## 2018-11-11 MED ORDER — SODIUM CHLORIDE 0.9 % IV SOLN
1.0000 g | INTRAVENOUS | Status: DC
  Administered 2018-11-11: 18:00:00 1 g via INTRAVENOUS
  Filled 2018-11-11: qty 10
  Filled 2018-11-11: qty 1

## 2018-11-11 MED ORDER — EPINEPHRINE HCL 5 MG/250ML IV SOLN IN NS
5.0000 ug/min | INTRAVENOUS | Status: DC
  Filled 2018-11-11: qty 250

## 2018-11-11 MED ORDER — SODIUM CHLORIDE 0.9% FLUSH
10.0000 mL | Freq: Two times a day (BID) | INTRAVENOUS | Status: DC
  Administered 2018-11-11 – 2018-11-13 (×3): 10 mL

## 2018-11-11 MED ORDER — FENTANYL CITRATE (PF) 100 MCG/2ML IJ SOLN: 50.0000 ug | INTRAMUSCULAR | Status: DC | PRN

## 2018-11-11 MED ORDER — FENTANYL CITRATE (PF) 100 MCG/2ML IJ SOLN
50.0000 ug | INTRAMUSCULAR | Status: DC | PRN
  Administered 2018-11-11: 17:00:00 50 ug via INTRAVENOUS
  Administered 2018-11-11: 100 ug via INTRAVENOUS
  Filled 2018-11-11 (×2): qty 2

## 2018-11-11 MED ORDER — EPINEPHRINE PF 1 MG/ML IJ SOLN
0.5000 ug/min | INTRAVENOUS | Status: DC
  Filled 2018-11-11: qty 4

## 2018-11-11 MED ORDER — MUPIROCIN 2 % EX OINT
1.0000 "application " | TOPICAL_OINTMENT | Freq: Two times a day (BID) | CUTANEOUS | Status: DC
  Administered 2018-11-11 – 2018-11-12 (×2): 1 via NASAL
  Filled 2018-11-11 (×2): qty 22

## 2018-11-11 MED ORDER — VANCOMYCIN HCL IN DEXTROSE 750-5 MG/150ML-% IV SOLN
750.0000 mg | Freq: Two times a day (BID) | INTRAVENOUS | Status: DC
  Filled 2018-11-11 (×2): qty 150

## 2018-11-11 MED ORDER — ONDANSETRON HCL 4 MG/2ML IJ SOLN
4.0000 mg | Freq: Once | INTRAMUSCULAR | Status: AC
  Administered 2018-11-11: 4 mg via INTRAVENOUS

## 2018-11-11 MED FILL — Medication: Qty: 1 | Status: AC

## 2018-11-11 NOTE — Progress Notes (Signed)
Patient was seen and examined.  She is intubated and sedated on pressors. With history of liver cirrhosis, portal hypertension admitted with GI bleeding.  Last hemoglobin 3.8 received 8 units of PRBC, 2 units of FFP and 3 units of platelets so far.  And IV PPI and octreotide.  IV antibiotics for SBP prophylaxis Intensivist, gastroenterology are involved.  GI is planning to do EGD once patient is hemodynamically stable Patient is critically ill with poor prognosis at this point of time

## 2018-11-11 NOTE — Consult Note (Signed)
PULMONARY / CRITICAL CARE MEDICINE   Name: Lisa Dennis MRN: [id number] DOB: 1965-05-09    ADMISSION DATE:  11/11/2018 CONSULTATION DATE:  11/11/2018  REFERRING MD: Dr. Sidney Ace  CHIEF COMPLAINT: GI bleed  BRIEF DISCUSSION: 53 year old female with past medical history of alcoholic cirrhosis, varices, GI bleed, hepatic encephalopathy who is admitted on 8/19 with hemorrhagic shock in the setting of acute GI bleed and coagulapathy (due to liver cirrhosis) requiring blood transfusions and vasopressors.  Intubated for airway protection in ED.  GI following with tentative plan for EGD 8/19.  Concern for possible sepsis.  HISTORY OF PRESENT ILLNESS:   Patient is a 53 year old female with a past medical history of alcohol abuse, alcoholic cirrhosis, GI bleed, esophageal varices, and hepatic encephalopathy who presents to [facility] ED on 11/11/2018 for unresponsiveness.  Patient is currently intubated and sedated, therefore history is obtained from ED and nursing notes.  Per notes EMS reported that patient started vomiting blood and then went unresponsive.  Family initiated CPR, and when EMS arrived patient was found to have a faint pulse.  Upon arrival to the ED the patient was noted to be hypotensive (BP 70/35), hypoxic (77% on room air), with altered mental status.  She was subsequently placed on nonrebreather and given IV fluids, Levophed, Vasopressin, Protonix bolus and drip, and octreotide bolus and drip.  She required intubation for airway protection and placement of a right femoral CVC in the ED.  She was noted to be hypothermic as well, therefore sepsis work-up in progress. Initial workup in the ED revealed hemoglobin 3.8, hematocrit 13, INR 2.5, PT 26.6, platelets 58, potassium 3.2, serum bicarb 19, lactic acid 7.1, AST 73, total bilirubin 3.3.  Chest x-ray is concerning for bibasilar atelectasis.  Her SARS-CoV-2 PCR is negative.  ED physician consulted with Dr. Ronelle Nigh with GI who recommended IV  vitamin K for elevated INR and to continue current medical management.  No emergent endoscopy was deemed necessary unless patient began to actively bleed.  She has received 2 units of O- PRBC's, and is to receive 2 units of crossmatched PRBCs.  Tentative plan is for EGD with Dr. Vicente Males later this morning.  She is being admitted to ICU for further workup and treatment of Hemorrhagic shock in the setting of Acute GI Bleed and coagulopathy, along with questionable sepsis.  PCCM is consulted for further management.  PAST MEDICAL HISTORY :  She  has a past medical history of Alcohol abuse, Anxiety, and Cirrhosis ([place]).  PAST SURGICAL HISTORY: She  has a past surgical history that includes Esophagogastroduodenoscopy (egd) with propofol (N/A, 06/08/2018).  Allergies  Allergen Reactions  . Benadryl [Diphenhydramine] Other (See Comments)    Reaction: makes crazy and can't sleep for days  . Latex Hives  . Morphine And Related Hives  . Penicillins Hives and Other (See Comments)    Did it involve swelling of the face/tongue/throat, SOB, or low BP? No Did it involve sudden or severe rash/hives, skin peeling, or any reaction on the inside of your mouth or nose? Unknown Did you need to seek medical attention at a hospital or doctor's office? Unknown When did it last happen?unknown If all above answers are "NO", may proceed with cephalosporin use.   . Sulfa Antibiotics Hives  . Tetracyclines & Related Hives    No current facility-administered medications on file prior to encounter.    Current Outpatient Medications on File Prior to Encounter  Medication Sig  . folic acid (FOLVITE) 1 MG tablet Take  1 tablet (1 mg total) by mouth daily.  Marland Kitchen lactulose (CHRONULAC) 10 GM/15ML solution 20gm oral twice a day (goal is 3 bowel movements a day, can go to three or four times a day dosing if needed)  . pantoprazole (PROTONIX) 40 MG tablet Take 1 tablet (40 mg total) by mouth daily.  . rifaximin (XIFAXAN) 550 MG  TABS tablet Take 1 tablet (550 mg total) by mouth 2 (two) times daily.  Marland Kitchen thiamine 100 MG tablet Take 1 tablet (100 mg total) by mouth daily.    FAMILY HISTORY:  Her has no family status information on file.    SOCIAL HISTORY: She  reports that she has quit smoking. She has never used smokeless tobacco. She reports current alcohol use.    COVID-19 DISASTER DECLARATION:  FULL CONTACT PHYSICAL EXAMINATION WAS NOT POSSIBLE DUE TO TREATMENT OF COVID-19 AND  CONSERVATION OF PERSONAL PROTECTIVE EQUIPMENT, LIMITED EXAM FINDINGS INCLUDE-  Patient assessed or the symptoms described in the history of present illness.  In the context of the Global COVID-19 pandemic, which necessitated consideration that the patient might be at risk for infection with the SARS-CoV-2 virus that causes COVID-19, Institutional protocols and algorithms that pertain to the evaluation of patients at risk for COVID-19 are in a state of rapid change based on information released by regulatory bodies including the CDC and federal and state organizations. These policies and algorithms were followed during the patient's care while in hospital.   REVIEW OF SYSTEMS:   Unable to assess due to intubation and sedation  SUBJECTIVE:  Unable to assess due to intubation and sedation  VITAL SIGNS: BP (!) 83/63 (BP Location: Right Arm)   Pulse 67   Temp (!) 92.7 F (33.7 C) (Bladder)   Resp 18   Ht '5\' 4"'  (1.626 m)   Wt 65.8 kg   SpO2 100%   BMI 24.89 kg/m   HEMODYNAMICS:    VENTILATOR SETTINGS: Vent Mode: AC FiO2 (%):  [50 %-100 %] 50 % Set Rate:  [16 bmp-20 bmp] 16 bmp Vt Set:  [500 mL] 500 mL PEEP:  [5 cmH20] 5 cmH20  INTAKE / OUTPUT: No intake/output data recorded.  PHYSICAL EXAMINATION: General: Critically ill-appearing female, jaundiced, laying in bed, intubated and sedated, no acute distress Neuro: Sedated, arouses to voice and follows commands, pupils PERRLA HEENT: Atraumatic, normocephalic, neck  supple, no JVD Cardiovascular: Regular rate and rhythm, S1-S2, no murmurs rubs or gallops, 2+ pulses Lungs: Coarse breath sounds bilaterally to auscultation, no wheezing, even, vent assisted Abdomen: Soft, nontender, distended, no guarding or rebound tenderness Musculoskeletal: No deformities, no edema, normal bulk and tone Skin: Cool and dry, no obvious rashes, lesions, or ulcerations  LABS:  BMET Recent Labs  Lab 11/11/18 0226  NA 141  K 3.2*  CL 114*  CO2 19*  BUN 6  CREATININE 0.50  GLUCOSE 130*    Electrolytes Recent Labs  Lab 11/11/18 0226  CALCIUM 7.0*    CBC Recent Labs  Lab 11/11/18 0226  WBC 5.5  HGB 3.8*  HCT 13.0*  PLT 58*    Coag's Recent Labs  Lab 11/11/18 0226  APTT 44*  INR 2.5*    Sepsis Markers Recent Labs  Lab 11/11/18 0320  LATICACIDVEN 7.1*    ABG Recent Labs  Lab 11/11/18 0350  PHART 7.31*  PCO2ART 25*  PO2ART 385*    Liver Enzymes Recent Labs  Lab 11/11/18 0226  AST 73*  ALT 20  ALKPHOS 46  BILITOT 3.3*  ALBUMIN  1.6*    Cardiac Enzymes No results for input(s): TROPONINI, PROBNP in the last 168 hours.  Glucose No results for input(s): GLUCAP in the last 168 hours.  Imaging Dg Chest 1 View  Result Date: 11/11/2018 CLINICAL DATA:  Post intubation. GI bleed. EXAM: CHEST  1 VIEW COMPARISON:  Radiograph 08/28/2018 FINDINGS: Endotracheal tube tip 2.5 cm from the carina at the thoracic inlet. Tip and side port of the enteric tube below the diaphragm in the stomach. Low lung volumes. Streaky bibasilar opacities consistent with atelectasis. Normal heart size and mediastinal contours. No large pleural effusion or pneumothorax. No pulmonary edema. IMPRESSION: 1. Endotracheal tube tip 2.5 cm from the carina at the thoracic inlet. Enteric tube in place with tip and side-port below the diaphragm. 2. Low lung volumes with bibasilar atelectasis. Electronically Signed   By: Keith Rake M.D.   On: 11/11/2018 03:30      STUDIES:  N/A  CULTURES: SARS-CoV-2 PCR 8/19>> negative Blood x2 8/19>> Urine 8/19>> Tracheal aspirate 8/19>>  ANTIBIOTICS: Ceftriaxone 8/19 x1 dose Cefepime 8/19>> Vancomycin 8/19>> Flagyl 8/19>>  SIGNIFICANT EVENTS: 8/19>> intubated for airway protection in ED 8/19>> admission to ICU 8/19>> Received 4 units pRBC's, to receive 2 FFP & 1 platelets  LINES/TUBES: 7.5 mm ET tube 8/19>> Right Femoral CVC 8/19>>   ASSESSMENT / PLAN:  PULMONARY A: Intubated for airway protection in setting of GI Bleed and decreased LOC P:   -Full Vent support, PRVC: 8 cc/kg -Wean PEEP and FiO2 as tolerated to maintain SPO2 greater than 92% -Follow intermittent chest x-ray and ABG as needed -Spontaneous breathing trial when parameters met -VAP bundle -PRN bronchodilators  CARDIOVASCULAR A:  Hemorrhagic shock in setting of GI bleed P:  -Continuous cardiac monitoring -Maintain MAP greater than 65 -Blood transfusions as indicated -IV fluids -Continue Levophed and vasopressin as needed to maintain MAP goal -Check troponin  RENAL A:   Anion gap metabolic acidosis in setting of lactic acidosis Hypokalemia P:   -Monitor I&O's / urinary output -Follow BMP -Ensure adequate renal perfusion -Avoid nephrotoxic agents as able -Replace electrolytes as indicated -IV fluids -Blood transfusions as indicated -Trend lactic acid  GASTROINTESTINAL A:   Acute GI bleed Alcoholic Cirrhosis Hx: Alcoholic cirrhosis, GI bleed, esophageal varices P:   -NPO -OG tube to low intermittent suction -Continue Protonix and octreotide infusions -ED physician discussed with Dr. Ronelle Nigh of GI>> GI recommends IV vitamin K for elevated INR and current medical management.  No emergent endoscopy unless patient starts to actively bleed -Tentative plan for EGD with Dr. Vicente Males later today 8/19 -Check Ammonia -Trend LFT's   HEMATOLOGIC A:   Acute blood loss anemia in setting of GI  bleed Coagulopathy in setting of Liver Cirrhosis Thrombocytopenia P:  -Monitor for S/Sx of bleeding -Trend CBC -SCD's for VTE Prophylaxis (no chemical prophylaxis due to GI bleed) -Transfuse for Hgb <8 -Transfuse platelets for platelet count <50 & active bleeding -Received IV vitamin K in ED, will give 2 units FFP 8/19 -Will give 1 unit platelets 8/19 -Trend INR/PT -Check fibrinogen   INFECTIOUS A:   Meets SIRS Criteria ?  Aspiration vs. ? Intra-abdominal source P:   -Monitor fever curve -Trend WBCs and procalcitonin -Follow pan cultures as above -Received Rocephin x1 dose in ED, continue cefepime and vancomycin, will add Flagyl for now -If procalcitonin negative, can consider d/c antibiotics  ENDOCRINE A:   No acute issues P:   -CBGs -Follow ICU hypo-/hyperglycemia protocol  NEUROLOGIC A:   Acute metabolic encephalopathy +/-  hepatic encephalopathy Sedation needs in setting of mechanical ventilation Alcohol abuse Hx: Anxiety P:   -RASS goal: 0 to -1 -Precedex to maintain RASS goal -Avoid sedating meds as able -Daily wake-up assessment -Provide supportive care -Obtain alcohol level -Continue lactulose -Monitor for s/sx of ETOH withdrawal, follow CIWA protocol -Banana bag -Continue folic acid, thiamine, multivitamin  FAMILY  - Updates: No family present during NP rounds 11/11/18.  - Inter-disciplinary family meet or Palliative Care meeting due by:  11/18/2018    Darel Hong, AGACNP-BC [place] Pager: [phone] Cell: [phone]  11/11/2018, 4:52 AM

## 2018-11-11 NOTE — ED Provider Notes (Addendum)
[facility] Emergency Department Provider Note  ____________________________________________  Time seen: Approximately 3:26 AM  I have reviewed the triage vital signs and the nursing notes.   HISTORY  Chief Complaint GI Bleeding  Level 5 caveat:  Portions of the history and physical were unable to be obtained due to AMS   HPI Lisa Dennis is a 53 y.o. female history of alcohol abuse, alcoholic cirrhosis complicated by hepatic encephalopathy, GI bleed, and varices who presents for evaluation of unresponsiveness.  According to EMS patient started vomiting blood and went unresponsive.  Family did CPR.  When EMS arrived patient had a faint pulse.  They did not noted any blood on or around the patient.  Patient was hypotensive, hypoxic, altered and unable to provide any history.  She was placed on a nonrebreather, started on IV fluids and transferred to the emergency room.  Upon arrival patient is pale, cool to the touch, GCS of 13, jaundiced and unable to provide any history.  Past Medical History:  Diagnosis Date  . Alcohol abuse   . Anxiety   . Cirrhosis [facility])     Patient Active Problem List   Diagnosis Date Noted  . Alcoholic intoxication with complication ([place]) 32/44/0102  . Hepatic encephalopathy ([place]) 08/27/2018  . GI bleeding 06/08/2018  . Alcoholic cirrhosis of liver without ascites ([place])   . Hematemesis without nausea     Past Surgical History:  Procedure Laterality Date  . ESOPHAGOGASTRODUODENOSCOPY (EGD) WITH PROPOFOL N/A 06/08/2018   Procedure: ESOPHAGOGASTRODUODENOSCOPY (EGD) WITH PROPOFOL;  Surgeon: Lucilla Lame, MD;  Location: [facility] ENDOSCOPY;  Service: Endoscopy;  Laterality: N/A;    Prior to Admission medications   Medication Sig Start Date End Date Taking? Authorizing Provider  folic acid (FOLVITE) 1 MG tablet Take 1 tablet (1 mg total) by mouth daily. 10/19/18   Henreitta Leber, MD  lactulose (CHRONULAC) 10 GM/15ML  solution 20gm oral twice a day (goal is 3 bowel movements a day, can go to three or four times a day dosing if needed) 10/19/18   Sainani, Belia Heman, MD  pantoprazole (PROTONIX) 40 MG tablet Take 1 tablet (40 mg total) by mouth daily. 10/19/18   Henreitta Leber, MD  rifaximin (XIFAXAN) 550 MG TABS tablet Take 1 tablet (550 mg total) by mouth 2 (two) times daily. 10/19/18 11/18/18  Henreitta Leber, MD  thiamine 100 MG tablet Take 1 tablet (100 mg total) by mouth daily. 10/19/18   Henreitta Leber, MD    Allergies Benadryl [diphenhydramine], Latex, Morphine and related, Penicillins, Sulfa antibiotics, and Tetracyclines & related  No family history on file.  Social History Social History   Tobacco Use  . Smoking status: Former Research scientist (life sciences)  . Smokeless tobacco: Never Used  Substance Use Topics  . Alcohol use: Yes    Comment: heavy  . Drug use: Not on file    Review of Systems  Constitutional: Negative for fever. + LOC Gastrointestinal: + vomiting blood  ____________________________________________   PHYSICAL EXAM:  VITAL SIGNS: ED Triage Vitals  Enc Vitals Group     BP 11/11/18 0211 (!) 70/35     Pulse Rate 11/11/18 0211 73     Resp 11/11/18 0211 20     Temp --      Temp src --      SpO2 11/11/18 0211 (!) 77 %     Weight 11/11/18 0202 145 lb (65.8 kg)     Height 11/11/18 0202 5\' 4"  (1.626 m)  Head Circumference --      Peak Flow --      Pain Score 11/11/18 0206 0     Pain Loc --      Pain Edu? --      Excl. in [place]? --     Constitutional: Awake, oriented to self only, GCS 13 HEENT:      Head: Normocephalic and atraumatic.         Eyes: Conjunctivae are normal. Sclera is icteric.       Mouth/Throat: Mucous membranes are moist.       Neck: Supple with no signs of meningismus. Cardiovascular: Regular rate and rhythm. No murmurs, gallops, or rubs. Respiratory: Normal respiratory effort, hypoxic. Lungs CTA Gastrointestinal: Soft, non distended, green stool guaiac negative.  Musculoskeletal: No edema, cyanosis, or erythema of extremities. Neurologic: GCS 13 Skin: Skin is cool, pale, jaundice Psychiatric: Mood and affect are normal. Speech and behavior are normal.  ____________________________________________   LABS (all labs ordered are listed, but only abnormal results are displayed)  Labs Reviewed  CBC WITH DIFFERENTIAL/PLATELET - Abnormal; Notable for the following components:      Result Value   RBC 1.27 (*)    Hemoglobin 3.8 (*)    HCT 13.0 (*)    MCV 102.4 (*)    MCHC 29.2 (*)    RDW 22.1 (*)    Platelets 58 (*)    All other components within normal limits  COMPREHENSIVE METABOLIC PANEL - Abnormal; Notable for the following components:   Potassium 3.2 (*)    Chloride 114 (*)    CO2 19 (*)    Glucose, Bld 130 (*)    Calcium 7.0 (*)    Total Protein 4.2 (*)    Albumin 1.6 (*)    AST 73 (*)    Total Bilirubin 3.3 (*)    All other components within normal limits  LACTIC ACID, PLASMA - Abnormal; Notable for the following components:   Lactic Acid, Venous 7.1 (*)    All other components within normal limits  PROTIME-INR - Abnormal; Notable for the following components:   Prothrombin Time 26.6 (*)    INR 2.5 (*)    All other components within normal limits  APTT - Abnormal; Notable for the following components:   aPTT 44 (*)    All other components within normal limits  BLOOD GAS, ARTERIAL - Abnormal; Notable for the following components:   pH, Arterial 7.31 (*)    pCO2 arterial 25 (*)    pO2, Arterial 385 (*)    Bicarbonate 12.6 (*)    Acid-base deficit 11.9 (*)    Allens test (pass/fail) MODERATE (*)    All other components within normal limits  SARS CORONAVIRUS 2 (HOSPITAL ORDER, [place] [facility])  CULTURE, BLOOD (ROUTINE X 2)  CULTURE, BLOOD (ROUTINE X 2)  URINALYSIS, COMPLETE (UACMP) WITH MICROSCOPIC  BASIC METABOLIC PANEL  CBC  PREPARE RBC (CROSSMATCH)  TYPE AND SCREEN  TYPE AND SCREEN    ____________________________________________  EKG  ED ECG REPORT I, Rudene Re, the attending physician, personally viewed and interpreted this ECG.  Sinus bradycardia, rate of 59, normal intervals, normal axis, no ST elevations or depressions. ____________________________________________  RADIOLOGY  I have personally reviewed the images performed during this visit and I agree with the Radiologist's read.   Interpretation by Radiologist:  Dg Chest 1 View  Result Date: 11/11/2018 CLINICAL DATA:  Post intubation. GI bleed. EXAM: CHEST  1 VIEW COMPARISON:  Radiograph 08/28/2018  FINDINGS: Endotracheal tube tip 2.5 cm from the carina at the thoracic inlet. Tip and side port of the enteric tube below the diaphragm in the stomach. Low lung volumes. Streaky bibasilar opacities consistent with atelectasis. Normal heart size and mediastinal contours. No large pleural effusion or pneumothorax. No pulmonary edema. IMPRESSION: 1. Endotracheal tube tip 2.5 cm from the carina at the thoracic inlet. Enteric tube in place with tip and side-port below the diaphragm. 2. Low lung volumes with bibasilar atelectasis. Electronically Signed   By: Keith Rake M.D.   On: 11/11/2018 03:30      ____________________________________________   PROCEDURES  Procedure(s) performed: yes Procedure Name: Intubation Date/Time: 11/11/2018 3:39 AM Performed by: Rudene Re, MD Pre-anesthesia Checklist: Patient identified, Patient being monitored, Emergency Drugs available, Timeout performed and Suction available Oxygen Delivery Method: Non-rebreather mask Preoxygenation: Pre-oxygenation with 100% oxygen Induction Type: Rapid sequence Ventilation: Mask ventilation without difficulty Laryngoscope Size: Glidescope and 3 Tube size: 7.5 mm Number of attempts: 1 Airway Equipment and Method: Video-laryngoscopy Placement Confirmation: ETT inserted through vocal cords under direct vision,  CO2 detector and  Breath sounds checked- equal and bilateral Secured at: 23 cm Tube secured with: ETT holder     .Central Line  Date/Time: 11/11/2018 3:41 AM Performed by: Rudene Re, MD Authorized by: Rudene Re, MD   Consent:    Consent obtained:  Emergent situation Pre-procedure details:    Hand hygiene: Hand hygiene performed prior to insertion     Sterile barrier technique: All elements of maximal sterile technique followed     Skin preparation:  2% chlorhexidine   Skin preparation agent: Skin preparation agent completely dried prior to procedure   Procedure details:    Location:  R femoral   Patient position:  Flat   Procedural supplies:  Triple lumen   Landmarks identified: yes     Ultrasound guidance: no     Number of attempts:  1   Successful placement: yes   Post-procedure details:    Post-procedure:  Dressing applied and line sutured   Assessment:  Blood return through all ports and free fluid flow   Patient tolerance of procedure:  Tolerated well, no immediate complications   Critical Care performed: yes  CRITICAL CARE Performed by: Rudene Re  ?  Total critical care time: 60 min  Critical care time was exclusive of separately billable procedures and treating other patients.  Critical care was necessary to treat or prevent imminent or life-threatening deterioration.  Critical care was time spent personally by me on the following activities: development of treatment plan with patient and/or surrogate as well as nursing, discussions with consultants, evaluation of patient's response to treatment, examination of patient, obtaining history from patient or surrogate, ordering and performing treatments and interventions, ordering and review of laboratory studies, ordering and review of radiographic studies, pulse oximetry and re-evaluation of patient's condition.  ____________________________________________   INITIAL IMPRESSION / ASSESSMENT AND PLAN / ED  COURSE   53 y.o. female history of alcohol abuse, alcoholic cirrhosis complicated by hepatic encephalopathy, GI bleed, and varices who presents for evaluation of unresponsiveness.  Patient arrives with a report to the patient was vomiting blood at home when she became unresponsive and underwent CPR.  Upon arrival patient is jaundice, cool to the touch, pale, GCS 13, alert to self only, hypotensive, and hypoxic on NRB. Patient was started on IVF, norepinephrine, protonix bolus and drip, octreotide bolus and drip, TXA IV, and emergent release blood. Patient was intubated for airway protection with  ketamine only. Central line placed. Patient remained hypotensive and vasopressin was added. NG tube place with yellow gastric secretions but no blood, rectal exam showing no active bleeding or melena. Rocephin was given for prophylaxis. Foley placed showing patient hypothermic. Blood cultures sent. Bear hugger placed. Patient sedated with precedex. Labs showed acute on chronic anemia but no active bleeding at this time, NG continues to have gastric secretions only with no blood. Patient will be admitted to the ICU. Patient discussed with GI, Dr. Ronelle Nigh who recommended IV vitamin K for elevated INR and agrees with management otherwise. No emergent endoscopy overnight unless patient starts to actively bleed.     _________________________ 4:46 AM on 11/11/2018 -----------------------------------------  BP improving. Patient currently receiving pRBC 3 and 4. On pressors. Pending ICU bed availability    As part of my medical decision making, I reviewed the following data within the [place] notes reviewed and incorporated, Labs reviewed , EKG interpreted , Old EKG reviewed, Old chart reviewed, Radiograph reviewed , Discussed with admitting physician , A consult was requested and obtained from this/these consultant(s) GI, Notes from prior ED visits and [place] Controlled Substance Database    Patient was evaluated in Emergency Department today for the symptoms described in the history of present illness. Patient was evaluated in the context of the global COVID-19 pandemic, which necessitated consideration that the patient might be at risk for infection with the SARS-CoV-2 virus that causes COVID-19. Institutional protocols and algorithms that pertain to the evaluation of patients at risk for COVID-19 are in a state of rapid change based on information released by regulatory bodies including the CDC and federal and state organizations. These policies and algorithms were followed during the patient's care in the ED.   ____________________________________________   FINAL CLINICAL IMPRESSION(S) / ED DIAGNOSES   Final diagnoses:  Gastrointestinal hemorrhage, unspecified gastrointestinal hemorrhage type  Alcoholic cirrhosis, unspecified whether ascites present ([place])  Acute on chronic blood loss anemia  Shock ([place])  Acute respiratory failure with hypoxia ([place])      NEW MEDICATIONS STARTED DURING THIS VISIT:  ED Discharge Orders    None       Note:  This document was prepared using Dragon voice recognition software and may include unintentional dictation errors.    Alfred Levins, [place], MD 11/11/18 [place], [place], MD 11/11/18 [phone]

## 2018-11-11 NOTE — Progress Notes (Signed)
Pharmacy Antibiotic Note  Lisa Dennis is a 53 y.o. female admitted on 11/11/2018 with sepsis.  Pharmacy has been consulted for Vancomycin and Cefepime dosing.  Pt received Vancomycin 1000mg  at ~ 0430, (will start next dose a little early since pt did not get a full loading dose)  Plan: Cefepime 2gm IV q8hrs  Vancomycin 750mg  mg IV Q 12 hrs. Goal AUC 400-550. Expected AUC: 499.7   SCr used: 0.8   Height: 5\' 4"  (162.6 cm) Weight: 145 lb (65.8 kg) IBW/kg (Calculated) : 54.7  Temp (24hrs), Avg:92.8 F (33.8 C), Min:92.6 F (33.7 C), Max:93.4 F (34.1 C)  Recent Labs  Lab 11/11/18 0226 11/11/18 0320  WBC 5.5  --   CREATININE 0.50  --   LATICACIDVEN  --  7.1*    Estimated Creatinine Clearance: 76.7 mL/min (by C-G formula based on SCr of 0.5 mg/dL).    Allergies  Allergen Reactions  . Benadryl [Diphenhydramine] Other (See Comments)    Reaction: makes crazy and can't sleep for days  . Latex Hives  . Morphine And Related Hives  . Penicillins Hives and Other (See Comments)    Did it involve swelling of the face/tongue/throat, SOB, or low BP? No Did it involve sudden or severe rash/hives, skin peeling, or any reaction on the inside of your mouth or nose? Unknown Did you need to seek medical attention at a hospital or doctor's office? Unknown When did it last happen?unknown If all above answers are "NO", may proceed with cephalosporin use.   . Sulfa Antibiotics Hives  . Tetracyclines & Related Hives   Antimicrobials this admission: 8/19 CTX x 1 8/19 Vancomycin >>  8/19 Cefepime >> 8/19 Flagyl >>  Dose adjustments this admission:  Microbiology results: 8/19 BCx: pending 8/19 UCx:  pending 8/19 MRSA PCR: pending  Thank you for allowing pharmacy to be a part of this patient's care.  Hart Robinsons A 11/11/2018 6:11 AM

## 2018-11-11 NOTE — Consult Note (Signed)
Jonathon Bellows , MD [address] [place], [place], [place], [address] [address], [place], [place], [place], [address] Phone: [phone]  Fax: [phone]  Consultation  Referring Provider:     Dr Margaretmary Eddy Primary Care Physician:  Patient, No Pcp Per Primary Gastroenterologist:  Dr. Allen Norris          Reason for Consultation:     GI bleed  Date of Admission:  11/11/2018 Date of Consultation:  11/11/2018         HPI:   Lisa Dennis is a 53 y.o. female has a history of liver cirrhosis secondary to alcohol abuse.  Last seen at our office in June 2020.  EGD in March 2020 demonstrated hypertensive portal gastropathy with grade 1 esophageal varices.  Admitted in June for hepatic encephalopathy with elevated alcohol levels.  10/21/2018: MRI liver shows features of cirrhosis, portal hypertension and ascites.  Numerous dysplastic nodules are seen throughout both lobes of the liver.  No features of HCC.  She presented to the emergency room with altered mental status and bloody vomitus per EMS.  In the ER she was hypotensive with a blood pressure of 70 x 35 and hypoxic.  Had to be commenced on IV Levophed.  INR was 2.5 and given vitamin K.  Tested for COVID and negative.  Intubated and NG aspirate was negative for blood. On admission creatinine is 0.79.  On admission hemoglobin was 3.8 g down from 8.63 weeks back.  Platelets are 58.   Patient is intubated, sedated and not able to provide any history. NG tube shows clear green fluid. No blood.  Past Medical History:  Diagnosis Date  . Alcohol abuse   . Anxiety   . Cirrhosis [facility])     Past Surgical History:  Procedure Laterality Date  . ESOPHAGOGASTRODUODENOSCOPY (EGD) WITH PROPOFOL N/A 06/08/2018   Procedure: ESOPHAGOGASTRODUODENOSCOPY (EGD) WITH PROPOFOL;  Surgeon: Lucilla Lame, MD;  Location: [facility] ENDOSCOPY;  Service: Endoscopy;  Laterality: N/A;    Prior to Admission medications   Medication Sig Start Date End Date Taking?  Authorizing Provider  folic acid (FOLVITE) 1 MG tablet Take 1 tablet (1 mg total) by mouth daily. 10/19/18   Henreitta Leber, MD  lactulose (CHRONULAC) 10 GM/15ML solution 20gm oral twice a day (goal is 3 bowel movements a day, can go to three or four times a day dosing if needed) 10/19/18   Sainani, Belia Heman, MD  pantoprazole (PROTONIX) 40 MG tablet Take 1 tablet (40 mg total) by mouth daily. 10/19/18   Henreitta Leber, MD  rifaximin (XIFAXAN) 550 MG TABS tablet Take 1 tablet (550 mg total) by mouth 2 (two) times daily. 10/19/18 11/18/18  Henreitta Leber, MD  thiamine 100 MG tablet Take 1 tablet (100 mg total) by mouth daily. 10/19/18   Henreitta Leber, MD    No family history on file.   Social History   Tobacco Use  . Smoking status: Former Research scientist (life sciences)  . Smokeless tobacco: Never Used  Substance Use Topics  . Alcohol use: Yes    Comment: heavy  . Drug use: Not on file    Allergies as of 11/11/2018 - Review Complete 10/21/2018  Allergen Reaction Noted  . Benadryl [diphenhydramine] Other (See Comments) 05/20/2018  . Latex Hives 10/19/2018  . Morphine and related Hives 05/20/2018  . Penicillins Hives and Other (See Comments) 05/20/2018  . Sulfa antibiotics Hives 05/20/2018  . Tetracyclines & related Hives 05/20/2018    Review of Systems:  Unable to obtain    Physical Exam:  Vital signs in last 24 hours: Temp:  [92.6 F (33.7 C)-100.6 F (38.1 C)] 100.6 F (38.1 C) (08/19 1123) Pulse Rate:  [63-93] 93 (08/19 1123) Resp:  [14-20] 16 (08/19 1123) BP: (51-146)/(27-95) 95/69 (08/19 1123) SpO2:  [77 %-100 %] 95 % (08/19 1136) FiO2 (%):  [28 %-100 %] 28 % (08/19 1136) Weight:  [65.8 kg] 65.8 kg (08/19 0202)   General:  Unable to assess Head:  Normocephalic and atraumatic. Eyes:   Unable to open on her own  Ears:  Unable to asess  Neck:  Supple; no masses or thyroidomegaly Lungs: Respirations even and unlabored. Lungs clear to auscultation bilaterally.   No wheezes, crackles, or  rhonchi.  Heart:  Regular rate and rhythm;  Without murmur, clicks, rubs or gallops Abdomen:  Soft, distedended nontender. Normal bowel sounds. No appreciable masses or hepatomegaly.  No rebound or guarding.  Neurologic:  sedated Skin:  Intact without significant lesions or rashes. Cervical Nodes:  No significant cervical adenopathy. Psych: cannot assess   LAB RESULTS: Recent Labs    11/11/18 0226  WBC 5.5  HGB 3.8*  HCT 13.0*  PLT 58*   BMET Recent Labs    11/11/18 0226 11/11/18 0650  NA 141 135  K 3.2* 4.3  CL 114* 108  CO2 19* 16*  GLUCOSE 130* 299*  BUN 6 8  CREATININE 0.50 0.79  CALCIUM 7.0* 6.6*   LFT Recent Labs    11/11/18 0226  PROT 4.2*  ALBUMIN 1.6*  AST 73*  ALT 20  ALKPHOS 46  BILITOT 3.3*   PT/INR Recent Labs    11/11/18 0226  LABPROT 26.6*  INR 2.5*    STUDIES: Dg Chest 1 View  Result Date: 11/11/2018 CLINICAL DATA:  Post intubation. GI bleed. EXAM: CHEST  1 VIEW COMPARISON:  Radiograph 08/28/2018 FINDINGS: Endotracheal tube tip 2.5 cm from the carina at the thoracic inlet. Tip and side port of the enteric tube below the diaphragm in the stomach. Low lung volumes. Streaky bibasilar opacities consistent with atelectasis. Normal heart size and mediastinal contours. No large pleural effusion or pneumothorax. No pulmonary edema. IMPRESSION: 1. Endotracheal tube tip 2.5 cm from the carina at the thoracic inlet. Enteric tube in place with tip and side-port below the diaphragm. 2. Low lung volumes with bibasilar atelectasis. Electronically Signed   By: Keith Rake M.D.   On: 11/11/2018 03:30      Impression / Plan:   Lisa Dennis is a 54 y.o. y/o female with a history of alcoholic liver cirrhosis with grade 1 esophageal varices and portal hypertensive gastropathy presents to the emergency room in hypotensive shock and bloody vomitus.  Subsequently intubated and NG tube aspirate has been negative for bleeding.  Hemoglobin was very low  at 3.8 g. Surprisingly had a normal BUN/creatinine ratio.  INR 2.5 suggesting of acute liver failure.  Transaminases are only mildly elevated with AST of 73 and total bilirubin of 3.3.     Impression: Acute upper GI bleed possibly esophageal varices although it surprising that her recent endoscopy she only had grade 1 esophageal varices and has normal BUN/Cr ratio  Plan 1.  Monitor CBC and transfuse.  When hemoglobin is over 7 g and hemodynamically stable we will plan to perform EGD unless actively bleeding when needs to be done sooner. Last Hb checked 3.8 grams Continue IV PPI and octreotide.  IV antibiotics for SBP prophylaxis in the setting of GI  bleed in a cirrhotic patient.  2.  Vitamin K 10 mg subcutaneous daily for 3 days to replace any malnutrition related vitamin K deficiencies contributing to elevated INR.  3.  She absolutely needs to stop drinking any alcohol.  4.  IV thiamine to prevent any Wernicke's encephalopathy.  5.  Check ultrasound Doppler of the liver to rule out portal vein thrombosis, Tylenol levels, diagnostic paracentesis to rule out SBP  6. IF does not wake up then will need CT scan to rule out sub dural hematoma.    Thank you for involving me in the care of this patient.      LOS: 0 days   Jonathon Bellows, MD  11/11/2018, 11:55 AM

## 2018-11-11 NOTE — ED Notes (Signed)
ABG RESULTS SHOWN TO DR.[email or web address].

## 2018-11-11 NOTE — ED Notes (Addendum)
Bedside monitor appears to be reading pt's HR inaccurately via EKG and seems to be doubling the actual HR (see previous series of VS). Pt's actual hr at this time is 34 (verified simultanously by the both the monitor pulse ox sensor and Zoll monitor at bedside).

## 2018-11-11 NOTE — Progress Notes (Addendum)
Initial Nutrition Assessment  DOCUMENTATION CODES:   Not applicable  INTERVENTION:  If patient remains intubated >24-48 hours, recommend initiating tube feeds once more stable.  If tube feeds are initiated, provide Vital 1.5 Cal at 45 mL/hr (1080 mL goal daily volume) + Pro-Stat 30 mL once daily per tube. Provides 1720 kcal, 88 grams of protein, 821 mL H2O daily.  If tube feeds are initiated provide minimum free water flush of 30 mL Q4hrs to maintain tube patency.  Patient is at risk for refeeding syndrome. If patient is initiated on dextrose-containing fluids, tube feeds, or diet, recommend monitoring potassium, phosphorus, and magnesium daily and replacing as needed.  NUTRITION DIAGNOSIS:   Inadequate oral intake related to inability to eat as evidenced by NPO status.  GOAL:   Provide needs based on ASPEN/SCCM guidelines  MONITOR:   Vent status, Labs, Weight trends, TF tolerance, I & O's  REASON FOR ASSESSMENT:   Ventilator    ASSESSMENT:   53 year old female with PMHx of EtOH abuse, cirrhosis, anxiety admitted with acute upper GI bleed, hemorrhagic shock requiring blood transfusions and vasopressors, intubated for airway protection in ED.   Patient intubated. Not on any sedation. On PRVC mode with FiO2 28% and PEEP 5 cmH2O. Abdomen distended per RN documentation. Last BM unknown. Patient was agitated during RD assessment so did not complete NFPE. She does appear to have some wasting from visualization, so patient is at risk for malnutrition. Concerned weight in chart is incorrect as previous weights were at least 10 kg lower. However, unsure if previous weights are accurate either as they fluctuate significantly, so will continue to use current weight at this time and adjust calculations as needed.  Enteral Access: OGT placed 8/19; terminates in stomach per chest x-ray 8/19; 52 cm at corner of mouth  MAP: 66-106 mmHg  Patient is currently intubated on ventilator  support Ve: 11.2 L/min Temp (24hrs), Avg:95.9 F (35.5 C), Min:92.6 F (33.7 C), Max:100.6 F (38.1 C)  Propofol: N/A  Medications reviewed and include: pantoprazole, ceftriaxone, norepinephrine gtt at 10 mcg/min, octreotide gtt, pantoprazole, potassium chloride 10 mEq IV x4 today, thiamine 500 mg IV daily.  Labs reviewed: CBG 266, CO2 26, Ammonia 68, Lactic Acid 5.5.  Discussed with RN and on rounds. Patient may be able to extubate in 24-48 hours.  NUTRITION - FOCUSED PHYSICAL EXAM:  Unable to complete at this time as patient was agitated during RD assessment.  Diet Order:   Diet Order    None     EDUCATION NEEDS:   No education needs have been identified at this time  Skin:  Skin Assessment: Reviewed RN Assessment  Last BM:  Unknown  Height:   Ht Readings from Last 1 Encounters:  11/11/18 5\' 4"  (1.626 m)   Weight:   Wt Readings from Last 1 Encounters:  11/11/18 65.8 kg   Ideal Body Weight:  54.5 kg  BMI:  Body mass index is 24.89 kg/m.  Estimated Nutritional Needs:   Kcal:  1705 (PSU 2003b w/ MSJ 1257, Ve 11.2, Tmax 38.1)  Protein:  80-100 grams (1.2-1.5 grams/kg)  Fluid:  1.8-2 L/day  Willey Blade, MS, RD, LDN Office: [phone] Pager: [phone] After Hours/Weekend Pager: [phone]

## 2018-11-11 NOTE — ED Notes (Signed)
Dr Alfred Levins made aware of pt's elevated Lactic Acid level at this time as reported by lab. Lactic Acid 7.22mmol/L

## 2018-11-11 NOTE — ED Triage Notes (Signed)
Pt to ED via EMS from home. Per family pt was unresponsive, cpr was initiated by family. Upon ems arrival to home pt had weak carotid pulse,was hypotensive and responsive to pain. Pt arrives to ED responding to pain, jaundice and lethargic. Per ems family states pt was vomiting blood.

## 2018-11-11 NOTE — H&P (Addendum)
[place] [facility] [place] NAME: Lisa Dennis    MR#:  [id number]  DATE OF BIRTH:  September 01, 1965  DATE OF ADMISSION:  11/11/2018  PRIMARY CARE PHYSICIAN: Patient, No Pcp Per   REQUESTING/REFERRING PHYSICIAN: Jearld Pies, MD CHIEF COMPLAINT:   Chief Complaint  Patient presents with  . GI Bleeding    HISTORY OF PRESENT ILLNESS:  Lisa Dennis  is a 53 y.o. female with a known history of alcohol abuse and alcoholic cirrhosis, who presented to the emergency room with acute onset of altered mental status and reported bloody vomitus per EMS.  She was significantly hypotensive and had thready pulse.  In the ER initial blood pressure was 70/35 with a pulse of 73 and pulse oximetry of 77% on room air and later blood pressure dropped to 54/32 with a pulse of 79 she was given IV fluid boluses and was started on IV Levophed as well as IV vasopressin for blood pressure support and had a right femoral CVP.  She was given octreotide and Protonix bolus and placed on infusion of both.  She was intubated and placed on mechanical ventilation.  She received IV Rocephin and IV vancomycin given elevated lactic acid of 7.1.  Her hemoglobin and hematocrit are 3.8 and 13 down from 8.6 and 28.6 on 10/18/2018.  She was given 2 units of O- packed red blood cells and was typed and crossmatched for 2 more.  INR came back 2.5 from 1.9 previously in July with a PT of 26.6 and PTT 44.  She was given IV vitamin K.  Her COVID-19 test came back negative.    Her ABG on assisted control showed pH 7.31 with a bicarbonate of 12.6, PCO2 25 PO2 of 385 and O2 sat 100% on her percent FiO2.  EKG showed mild sinus bradycardia with a rate of 59 with borderline intraventricular conduction delay and low voltage QRS.  Portable chest x-ray showed ET tube tip at 2.5 cm from the carina at the thoracic inlet with low lung volumes and bibasilar atelectasis.  Dr. Bonna Gains was notified about  the patient and and stated that Dr. Jarold Song scope the patient in a.m. unless she continues to bleed and need urgent EGD.  The patient had an NG tube without bloody aspirate.  The patient's blood pressures been gradually improving to 94/65.  Her map is above 65 and therefore epinephrine IV drip was held off.  She was adequately sedated on IV Precedex on mechanical ventilation.  She was also given IV Zofran.  She will be admitted to an ICU bed for further evaluation and management. PAST MEDICAL HISTORY:   Past Medical History:  Diagnosis Date  . Alcohol abuse   . Anxiety   . Cirrhosis ([place])     PAST SURGICAL HISTORY:   Past Surgical History:  Procedure Laterality Date  . ESOPHAGOGASTRODUODENOSCOPY (EGD) WITH PROPOFOL N/A 06/08/2018   Procedure: ESOPHAGOGASTRODUODENOSCOPY (EGD) WITH PROPOFOL;  Surgeon: Lucilla Lame, MD;  Location: [facility];  Service: Endoscopy;  Laterality: N/A;    SOCIAL HISTORY:   Social History   Tobacco Use  . Smoking status: Former Research scientist (life sciences)  . Smokeless tobacco: Never Used  Substance Use Topics  . Alcohol use: Yes    Comment: heavy    FAMILY HISTORY:  No family history on file.  DRUG ALLERGIES:   Allergies  Allergen Reactions  . Benadryl [Diphenhydramine] Other (See Comments)    Reaction: makes crazy and can't sleep for days  .  Latex Hives  . Morphine And Related Hives  . Penicillins Hives and Other (See Comments)    Did it involve swelling of the face/tongue/throat, SOB, or low BP? No Did it involve sudden or severe rash/hives, skin peeling, or any reaction on the inside of your mouth or nose? Unknown Did you need to seek medical attention at a hospital or doctor's office? Unknown When did it last happen?unknown If all above answers are "NO", may proceed with cephalosporin use.   . Sulfa Antibiotics Hives  . Tetracyclines & Related Hives    REVIEW OF SYSTEMS:   Review of Systems  Constitutional: Negative for chills.   As per history of  present illness. All pertinent systems were reviewed above. Constitutional,  HEENT, cardiovascular, respiratory, GI, GU, musculoskeletal, neuro, psychiatric, endocrine,  integumentary and hematologic systems were reviewed and are otherwise  negative/unremarkable except for positive findings mentioned above in the HPI.   MEDICATIONS AT HOME:   Prior to Admission medications   Medication Sig Start Date End Date Taking? Authorizing Provider  folic acid (FOLVITE) 1 MG tablet Take 1 tablet (1 mg total) by mouth daily. 10/19/18   Henreitta Leber, MD  lactulose (CHRONULAC) 10 GM/15ML solution 20gm oral twice a day (goal is 3 bowel movements a day, can go to three or four times a day dosing if needed) 10/19/18   Sainani, Belia Heman, MD  pantoprazole (PROTONIX) 40 MG tablet Take 1 tablet (40 mg total) by mouth daily. 10/19/18   Henreitta Leber, MD  rifaximin (XIFAXAN) 550 MG TABS tablet Take 1 tablet (550 mg total) by mouth 2 (two) times daily. 10/19/18 11/18/18  Henreitta Leber, MD  thiamine 100 MG tablet Take 1 tablet (100 mg total) by mouth daily. 10/19/18   Henreitta Leber, MD      VITAL SIGNS:  Blood pressure (!) 123/51, pulse 66, temperature (!) 92.6 F (33.7 C), temperature source Bladder, resp. rate 18, height 5\' 4"  (1.626 m), weight 65.8 kg, SpO2 100 %.  PHYSICAL EXAMINATION:  Physical Exam  GENERAL:  53 y.o.-year-old acutely ill female patient lying in the bed, sedated, intubated on mechanical ventilation with no acute distress or agitation.  EYES: Pupils equal, round, reactive to light and accommodation.  Positive scleral icterus.  Positive pallor extraocular muscles intact.  HEENT: Head atraumatic, normocephalic. Oropharynx: ET tube in place.  Nose: NG tube in place with clear aspirate. NECK:  Supple, no jugular venous distention. No thyroid enlargement, no tenderness.  LUNGS: Normal breath sounds bilaterally, no wheezing, rales,rhonchi or crepitation. No use of accessory muscles of  respiration.  CARDIOVASCULAR: Regular rate and rhythm, tachycardic, S1, S2 normal. No murmurs, rubs, or gallops.  ABDOMEN: Soft, nondistended, nontender. Bowel sounds present. No organomegaly or mass.  EXTREMITIES: No pedal edema, cyanosis, or clubbing.  NEUROLOGIC: She is sedated and intubated.  No lateralizing signs.  SKIN: No obvious rash, lesion, or ulcer.  Positive pallor and icterus.  LABORATORY PANEL:   CBC Recent Labs  Lab 11/11/18 0226  WBC 5.5  HGB 3.8*  HCT 13.0*  PLT 58*   ------------------------------------------------------------------------------------------------------------------  Chemistries  Recent Labs  Lab 11/11/18 0226  NA 141  K 3.2*  CL 114*  CO2 19*  GLUCOSE 130*  BUN 6  CREATININE 0.50  CALCIUM 7.0*  AST 73*  ALT 20  ALKPHOS 46  BILITOT 3.3*   ------------------------------------------------------------------------------------------------------------------  Cardiac Enzymes No results for input(s): TROPONINI in the last 168 hours. ------------------------------------------------------------------------------------------------------------------  RADIOLOGY:  Dg Chest 1 View  Result Date: 11/11/2018 CLINICAL DATA:  Post intubation. GI bleed. EXAM: CHEST  1 VIEW COMPARISON:  Radiograph 08/28/2018 FINDINGS: Endotracheal tube tip 2.5 cm from the carina at the thoracic inlet. Tip and side port of the enteric tube below the diaphragm in the stomach. Low lung volumes. Streaky bibasilar opacities consistent with atelectasis. Normal heart size and mediastinal contours. No large pleural effusion or pneumothorax. No pulmonary edema. IMPRESSION: 1. Endotracheal tube tip 2.5 cm from the carina at the thoracic inlet. Enteric tube in place with tip and side-port below the diaphragm. 2. Low lung volumes with bibasilar atelectasis. Electronically Signed   By: Keith Rake M.D.   On: 11/11/2018 03:30      IMPRESSION AND PLAN:   1.  GI bleeding likely of  upper GI etiology with coagulopathy likely secondary to her liver cirrhosis with subsequent acute blood loss anemia and hypovolemic shock. -The patient will be admitted to an ICU bed. -She will be placed on ventilator protocol. -We will continue her on IV Levophed and vasopressin drips. -For her blood loss anemia she received 2 units of O- packed red blood cells and was typed and crossmatched for 2 more PRBCs units that will be transfused when available.  Will follow posttransfusion H&H. -Dr. Bonna Gains was consulted and Dr. Vicente Males will perform EGD in a.m. - We will continue her on IV octreotide and Protonix drips. -We will continue hydration with IV normal saline. - She received vitamin K for her coagulopathy. -We will continue NG tube to low suction. -I have discussed the case with the e- link intensivist.  2.  Lactic acidosis, hypokalemia and elevated AST with history of alcoholic cirrhosis. -Given her shock and the possibility of associated sepsis she was started on IV Rocephin and vancomycin after blood cultures. -We will continue her on IV cefepime and vancomycin and follow her lactic acid. -Her COVID 19 test came back negative. -We will obtain urinalysis and urine culture and sensitivity. - We will follow CMP and replace her potassium.  3.  Alcohol abuse. -We will obtain alcohol level and placed patient on banana bag daily - She will be continued on lactulose for history of hepatic encephalopathy with improvement of her volume status.  4.  DVT prophylaxis. -SCDs. -Medical prophylaxis currently contraindicated due to GI bleeding.  5.  GI prophylaxis. - The patient is on IV Protonix drip.  All the records are reviewed and case discussed with ED provider. The plan of care was discussed in details with the patient (and family). I answered all questions. The patient agreed to proceed with the above mentioned plan. Further management will depend upon hospital course.   CODE STATUS:  Full code  TOTAL TIME TAKING CARE OF THIS PATIENT: 75 minutes.    Christel Mormon M.D on 11/11/2018 at 4:47 AM  Pager - [phone]  After 6pm go to [email or web address] - Proofreader  [facility]  Office  [phone]  CC: Primary care physician; Patient, No Pcp Per   Note: This dictation was prepared with Dragon dictation along with smaller phrase technology. Any transcriptional errors that result from this process are unintentional.

## 2018-11-11 NOTE — ED Notes (Signed)
ED TO INPATIENT HANDOFF REPORT  ED Nurse Name and Phone #: Daiva Nakayama, [place] Name/Age/Gender Lisa Dennis 53 y.o. female Room/Bed: ED11A/ED11A  Code Status   Code Status: Full Code  Home/SNF/Other Home   Triage Complete: Triage complete  Chief Complaint [facility] - GI Bleeding  Triage Note Pt to ED via EMS from home. Per family pt was unresponsive, cpr was initiated by family. Upon ems arrival to home pt had weak carotid pulse,was hypotensive and responsive to pain. Pt arrives to ED responding to pain, jaundice and lethargic. Per ems family states pt was vomiting blood.    Allergies Allergies  Allergen Reactions  . Benadryl [Diphenhydramine] Other (See Comments)    Reaction: makes crazy and can't sleep for days  . Latex Hives  . Morphine And Related Hives  . Penicillins Hives and Other (See Comments)    Did it involve swelling of the face/tongue/throat, SOB, or low BP? No Did it involve sudden or severe rash/hives, skin peeling, or any reaction on the inside of your mouth or nose? Unknown Did you need to seek medical attention at a hospital or doctor's office? Unknown When did it last happen?unknown If all above answers are "NO", may proceed with cephalosporin use.   . Sulfa Antibiotics Hives  . Tetracyclines & Related Hives    Level of Care/Admitting Diagnosis ED Disposition    ED Disposition Condition [facility] Hospital Area: [place] [[id number]]  Level of Care: ICU [6]  Covid Evaluation: Asymptomatic Screening Protocol (No Symptoms)  Diagnosis: GI bleeding [818299]  Admitting Physician: Christel Mormon [[id number]]  Attending Physician: Christel Mormon [[id number]]  Estimated length of stay: 3 - 4 days  Certification:: I certify this patient will need inpatient services for at least 2 midnights  PT Class (Do Not Modify): Inpatient [101]  PT Acc Code (Do Not Modify): Private [1]       B Medical/Surgery History Past Medical  History:  Diagnosis Date  . Alcohol abuse   . Anxiety   . Cirrhosis [facility])    Past Surgical History:  Procedure Laterality Date  . ESOPHAGOGASTRODUODENOSCOPY (EGD) WITH PROPOFOL N/A 06/08/2018   Procedure: ESOPHAGOGASTRODUODENOSCOPY (EGD) WITH PROPOFOL;  Surgeon: Lucilla Lame, MD;  Location: [facility] ENDOSCOPY;  Service: Endoscopy;  Laterality: N/A;     A IV Location/Drains/Wounds Patient Lines/Drains/Airways Status   Active Line/Drains/Airways    Name:   Placement date:   Placement time:   Site:   Days:   Peripheral IV 11/11/18 Right Wrist   11/11/18    0218    Wrist   less than 1   Peripheral IV 11/11/18 Right Hand   11/11/18    0219    Hand   less than 1   Peripheral IV 11/11/18 Left Forearm   11/11/18    0219    Forearm   less than 1   Peripheral IV 11/11/18 Left Antecubital   11/11/18    0314    Antecubital   less than 1   NG/OG Tube Orogastric   11/11/18    0230    -   less than 1   Urethral Catheter Double-lumen 16 Fr.   11/11/18    0406    Double-lumen   less than 1   Airway 7.5 mm   11/11/18    0223     less than 1   Airway 7.5 mm   11/11/18    0223  less than 1          Intake/Output Last 24 hours  Intake/Output Summary (Last 24 hours) at 11/11/2018 0451 Last data filed at 11/11/2018 3614 Gross per 24 hour  Intake 750 ml  Output -  Net 750 ml    Labs/Imaging Results for orders placed or performed during the hospital encounter of 11/11/18 (from the past 48 hour(s))  Prepare RBC     Status: None   Collection Time: 11/11/18  2:26 AM  Result Value Ref Range   Order Confirmation      ORDER PROCESSED BY BLOOD BANK Performed at [facility], [address], [place], [place] [address]   Type and screen Ordered by PROVIDER DEFAULT     Status: None (Preliminary result)   Collection Time: 11/11/18  2:26 AM  Result Value Ref Range   ABO/RH(D) A POS    Antibody Screen NEG    Sample Expiration 11/14/2018,2359    Unit Number [id number]    Blood Component  Type RED CELLS,LR    Unit division 00    Status of Unit ISSUED    Transfusion Status OK TO TRANSFUSE    Crossmatch Result Compatible    Unit Number [id number]    Blood Component Type RED CELLS,LR    Unit division 00    Status of Unit ISSUED    Transfusion Status OK TO TRANSFUSE    Crossmatch Result Compatible    Unit Number [id number]    Blood Component Type RED CELLS,LR    Unit division 00    Status of Unit ISSUED    Transfusion Status OK TO TRANSFUSE    Crossmatch Result      Compatible Performed at [facility], [address] [place], [place] [address]    Unit Number [id number]    Blood Component Type RED CELLS,LR    Unit division 00    Status of Unit ISSUED    Transfusion Status OK TO TRANSFUSE    Crossmatch Result COMPATIBLE   CBC with Differential     Status: Abnormal   Collection Time: 11/11/18  2:26 AM  Result Value Ref Range   WBC 5.5 4.0 - 10.5 K/uL   RBC 1.27 (L) 3.87 - 5.11 MIL/uL   Hemoglobin 3.8 (LL) 12.0 - 15.0 g/dL    Comment: This critical result has verified and been called to [facility] by Carmela Hurt on 08 19 2020 at 0400, and has been read back.    HCT 13.0 (LL) 36.0 - 46.0 %    Comment: This critical result has verified and been called to [facility] by Carmela Hurt on 08 19 2020 at 0400, and has been read back.    MCV 102.4 (H) 80.0 - 100.0 fL   MCH 29.9 26.0 - 34.0 pg   MCHC 29.2 (L) 30.0 - 36.0 g/dL   RDW 22.1 (H) 11.5 - 15.5 %   Platelets 58 (L) 150 - 400 K/uL    Comment: Immature Platelet Fraction may be clinically indicated, consider ordering this additional test [id number]    nRBC 0.0 0.0 - 0.2 %   Neutrophils Relative % 43 %   Neutro Abs 2.4 1.7 - 7.7 K/uL   Lymphocytes Relative 48 %   Lymphs Abs 2.7 0.7 - 4.0 K/uL   Monocytes Relative 5 %   Monocytes Absolute 0.3 0.1 - 1.0 K/uL   Eosinophils Relative 2 %   Eosinophils Absolute 0.1 0.0 - 0.5 K/uL   Basophils Relative  1 %   Basophils Absolute 0.0  0.0 - 0.1 K/uL   Immature Granulocytes 1 %   Abs Immature Granulocytes 0.03 0.00 - 0.07 K/uL   Polychromasia PRESENT    Ovalocytes PRESENT     Comment: Performed at [facility], [address] [place], [place] [address]  Comprehensive metabolic panel     Status: Abnormal   Collection Time: 11/11/18  2:26 AM  Result Value Ref Range   Sodium 141 135 - 145 mmol/L   Potassium 3.2 (L) 3.5 - 5.1 mmol/L   Chloride 114 (H) 98 - 111 mmol/L   CO2 19 (L) 22 - 32 mmol/L   Glucose, Bld 130 (H) 70 - 99 mg/dL   BUN 6 6 - 20 mg/dL   Creatinine, Ser 0.50 0.44 - 1.00 mg/dL   Calcium 7.0 (L) 8.9 - 10.3 mg/dL   Total Protein 4.2 (L) 6.5 - 8.1 g/dL   Albumin 1.6 (L) 3.5 - 5.0 g/dL   AST 73 (H) 15 - 41 U/L   ALT 20 0 - 44 U/L   Alkaline Phosphatase 46 38 - 126 U/L   Total Bilirubin 3.3 (H) 0.3 - 1.2 mg/dL   GFR calc non Af Amer >60 >60 mL/min   GFR calc Af Amer >60 >60 mL/min   Anion gap 8 5 - 15    Comment: Performed at [facility], [address] [place], [place] [address]  Protime-INR     Status: Abnormal   Collection Time: 11/11/18  2:26 AM  Result Value Ref Range   Prothrombin Time 26.6 (H) 11.4 - 15.2 seconds   INR 2.5 (H) 0.8 - 1.2    Comment: (NOTE) INR goal varies based on device and disease states. Performed at [facility], [address] [place], [place] [address]   APTT     Status: Abnormal   Collection Time: 11/11/18  2:26 AM  Result Value Ref Range   aPTT 44 (H) 24 - 36 seconds    Comment:        IF BASELINE aPTT IS ELEVATED, SUGGEST PATIENT RISK ASSESSMENT BE USED TO DETERMINE APPROPRIATE ANTICOAGULANT THERAPY. Performed at [facility], [address] [place],  [address]   SARS Coronavirus 2 [facility] order, Performed in [facility] hospital lab) Nasopharyngeal Nasopharyngeal Swab     Status: None   Collection Time: 11/11/18  2:26 AM   Specimen: Nasopharyngeal Swab  Result Value Ref Range   SARS Coronavirus 2 NEGATIVE  NEGATIVE    Comment: (NOTE) If result is NEGATIVE SARS-CoV-2 target nucleic acids are NOT DETECTED. The SARS-CoV-2 RNA is generally detectable in upper and lower  respiratory specimens during the acute phase of infection. The lowest  concentration of SARS-CoV-2 viral copies this assay can detect is 250  copies / mL. A negative result does not preclude SARS-CoV-2 infection  and should not be used as the sole basis for treatment or other  patient management decisions.  A negative result may occur with  improper specimen collection / handling, submission of specimen other  than nasopharyngeal swab, presence of viral mutation(s) within the  areas targeted by this assay, and inadequate number of viral copies  (<250 copies / mL). A negative result must be combined with clinical  observations, patient history, and epidemiological information. If result is POSITIVE SARS-CoV-2 target nucleic acids are DETECTED. The SARS-CoV-2 RNA is generally detectable in upper and lower  respiratory specimens dur ing the acute phase of infection.  Positive  results are indicative of active infection with SARS-CoV-2.  Clinical  correlation with patient history and other diagnostic information is  necessary to determine patient infection status.  Positive results do  not rule out bacterial infection or co-infection with other viruses. If result is PRESUMPTIVE POSTIVE SARS-CoV-2 nucleic acids MAY BE PRESENT.   A presumptive positive result was obtained on the submitted specimen  and confirmed on repeat testing.  While 2019 novel coronavirus  (SARS-CoV-2) nucleic acids may be present in the submitted sample  additional confirmatory testing may be necessary for epidemiological  and / or clinical management purposes  to differentiate between  SARS-CoV-2 and other Sarbecovirus currently known to infect humans.  If clinically indicated additional testing with an alternate test  methodology [phone]) is advised. The  SARS-CoV-2 RNA is generally  detectable in upper and lower respiratory sp ecimens during the acute  phase of infection. The expected result is Negative. Fact Sheet for Patients:  StrictlyIdeas.no Fact Sheet for Healthcare Providers: BankingDealers.co.za This test is not yet approved or cleared by the [place] FDA and has been authorized for detection and/or diagnosis of SARS-CoV-2 by FDA under an Emergency Use Authorization (EUA).  This EUA will remain in effect (meaning this test can be used) for the duration of the COVID-19 declaration under Section 564(b)(1) of the Act, 21 U.S.C. section 360bbb-3(b)(1), unless the authorization is terminated or revoked sooner. Performed at [facility], [address] [place], [place] [address]   Lactic acid, plasma     Status: Abnormal   Collection Time: 11/11/18  3:20 AM  Result Value Ref Range   Lactic Acid, Venous 7.1 (HH) 0.5 - 1.9 mmol/L    Comment: CRITICAL RESULT CALLED TO, READ BACK BY AND VERIFIED WITH BUTCH WOODS ON 11/11/18 AT [facility] Performed at [facility], [address] [place], [place] [address]   Blood gas, arterial ([facility] & [facility] ONLY)     Status: Abnormal   Collection Time: 11/11/18  3:50 AM  Result Value Ref Range   FIO2 1.00    Delivery systems VENTILATOR    Mode ASSIST CONTROL    VT 500 mL   Peep/cpap 5.0 cm H20   pH, Arterial 7.31 (L) 7.350 - 7.450   pCO2 arterial 25 (L) 32.0 - 48.0 mmHg   pO2, Arterial 385 (H) 83.0 - 108.0 mmHg   Bicarbonate 12.6 (L) 20.0 - 28.0 mmol/L   Acid-base deficit 11.9 (H) 0.0 - 2.0 mmol/L   O2 Saturation 100.0 %   Patient temperature 37.0    Collection site LEFT RADIAL    Sample type ARTERIAL DRAW    Allens test (pass/fail) MODERATE (A) PASS   Mechanical Rate 20     Comment: Performed at [facility], [address] [place], [place] [address]   Dg Chest 1 View  Result Date: 11/11/2018 CLINICAL DATA:   Post intubation. GI bleed. EXAM: CHEST  1 VIEW COMPARISON:  Radiograph 08/28/2018 FINDINGS: Endotracheal tube tip 2.5 cm from the carina at the thoracic inlet. Tip and side port of the enteric tube below the diaphragm in the stomach. Low lung volumes. Streaky bibasilar opacities consistent with atelectasis. Normal heart size and mediastinal contours. No large pleural effusion or pneumothorax. No pulmonary edema. IMPRESSION: 1. Endotracheal tube tip 2.5 cm from the carina at the thoracic inlet. Enteric tube in place with tip and side-port below the diaphragm. 2. Low lung volumes with bibasilar atelectasis. Electronically Signed   By: Aurther Loft.D.  On: 11/11/2018 03:30    Pending Labs Unresulted Labs (From admission, onward)    Start     Ordered   11/11/18 2505  Basic metabolic panel  Tomorrow morning,   STAT     11/11/18 0442   11/11/18 0500  CBC  Tomorrow morning,   STAT     11/11/18 0442   11/11/18 0332  Urinalysis, Complete w Microscopic  ONCE - STAT,   STAT     11/11/18 0331   11/11/18 0332  Blood culture (routine x 2)  BLOOD CULTURE X 2,   STAT     11/11/18 0331   11/11/18 0226  Type and screen [facility]  Once,   STAT    Comments: [facility]    11/11/18 0225          Vitals/Pain Today's Vitals   11/11/18 0427 11/11/18 0432 11/11/18 0445 11/11/18 0449  BP: (!) 86/63 (!) 83/64 (!) 123/51 (!) 83/63  Pulse: 69 72 66 67  Resp: 18 18 18 18   Temp: (!) 92.6 F (33.7 C) (!) 92.6 F (33.7 C)  (!) 92.7 F (33.7 C)  TempSrc: Oral Bladder  Bladder  SpO2: 100% 100% 100% 100%  Weight:      Height:      PainSc: Asleep       Isolation Precautions No active isolations  Medications Medications  pantoprazole (PROTONIX) 80 mg in sodium chloride 0.9 % 250 mL (0.32 mg/mL) infusion (8 mg/hr Intravenous New Bag/Given 11/11/18 0352)  pantoprazole (PROTONIX) injection 40 mg (has no administration in time range)  octreotide (SANDOSTATIN) 2  mcg/mL load via infusion 50 mcg (50 mcg Intravenous Bolus from Bag 11/11/18 0222)    And  octreotide (SANDOSTATIN) 500 mcg in sodium chloride 0.9 % 250 mL (2 mcg/mL) infusion (50 mcg/hr Intravenous New Bag/Given 11/11/18 0222)  vasopressin (PITRESSIN) 100 Units in dextrose 5 % 250 mL (0.4 Units/mL) infusion (0.6 Units/min Intravenous Rate/Dose Change 11/11/18 0339)  dexmedetomidine (PRECEDEX) 400 MCG/100ML (4 mcg/mL) infusion (1.2 mcg/kg/hr  70 kg (Order-Specific) Intravenous Rate/Dose Change 11/11/18 0251)  norepinephrine (LEVOPHED) 4mg  in 278mL premix infusion (10 mcg/min Intravenous Rate/Dose Change 11/11/18 0218)  phytonadione (VITAMIN K) 10 mg in dextrose 5 % 50 mL IVPB (10 mg Intravenous New Bag/Given 11/11/18 0418)  vancomycin (VANCOCIN) IVPB 1000 mg/200 mL premix (1,000 mg Intravenous New Bag/Given 11/11/18 0427)  EPINEPHrine (ADRENALIN) 4 mg in dextrose 5 % 250 mL (0.016 mg/mL) infusion (0 mcg/min Intravenous Hold 11/11/18 0421)  0.9 %  sodium chloride infusion (has no administration in time range)  rifaximin (XIFAXAN) tablet 550 mg (has no administration in time range)  lactulose (CHRONULAC) 10 GM/15ML solution 20 g (has no administration in time range)  folic acid (FOLVITE) tablet 1 mg (has no administration in time range)  thiamine tablet 100 mg (has no administration in time range)  0.9 %  sodium chloride infusion (has no administration in time range)  traZODone (DESYREL) tablet 25 mg (has no administration in time range)  ondansetron (ZOFRAN) tablet 4 mg (has no administration in time range)    Or  ondansetron (ZOFRAN) injection 4 mg (has no administration in time range)  acetaminophen (TYLENOL) tablet 650 mg (has no administration in time range)    Or  acetaminophen (TYLENOL) suppository 650 mg (has no administration in time range)  sodium chloride 0.9 % 1,000 mL with thiamine 397 mg, folic acid 1 mg, multivitamins adult 10 mL infusion (has no administration in time range)  ceFEPIme  (  MAXIPIME) 2 g in sodium chloride 0.9 % 100 mL IVPB (has no administration in time range)  pantoprazole (PROTONIX) injection 40 mg (has no administration in time range)  octreotide (SANDOSTATIN) 500 mcg in sodium chloride 0.9 % 250 mL (2 mcg/mL) infusion (has no administration in time range)  norepinephrine (LEVOPHED) 4mg  in 219mL premix infusion (has no administration in time range)  EPINEPHrine (ADRENALIN) 4 mg in NS 250 mL (0.016 mg/mL) premix infusion (has no administration in time range)  pantoprazole (PROTONIX) 80 mg in sodium chloride 0.9 % 100 mL IVPB (0 mg Intravenous Stopped 11/11/18 0350)  ondansetron (ZOFRAN) injection 4 mg (4 mg Intravenous Given 11/11/18 0210)  0.9 %  sodium chloride infusion (0 mL/hr Intravenous Stopped 11/11/18 0333)  tranexamic acid (CYKLOKAPRON) IVPB 1,000 mg (0 mg Intravenous Stopped 11/11/18 0224)  ketamine (KETALAR) injection (70 mg Intravenous Given 11/11/18 0224)  cefTRIAXone (ROCEPHIN) 1 g in sodium chloride 0.9 % 100 mL IVPB (0 g Intravenous Stopped 11/11/18 0355)    Mobility walks     Focused Assessments   R Recommendations: See Admitting Provider Note  Report given to:   Additional Notes: None

## 2018-11-12 ENCOUNTER — Inpatient Hospital Stay: Payer: BLUE CROSS/BLUE SHIELD

## 2018-11-12 ENCOUNTER — Encounter: Payer: Self-pay | Admitting: Certified Registered Nurse Anesthetist

## 2018-11-12 ENCOUNTER — Encounter
Admission: EM | Disposition: A | Discharge status: Home / Self Care | Payer: Self-pay | Source: Home / Self Care | Attending: Internal Medicine

## 2018-11-12 DIAGNOSIS — J9601 Acute respiratory failure with hypoxia: Secondary | ICD-10-CM

## 2018-11-12 DIAGNOSIS — K2921 Alcoholic gastritis with bleeding: Secondary | ICD-10-CM

## 2018-11-12 DIAGNOSIS — K7030 Alcoholic cirrhosis of liver without ascites: Secondary | ICD-10-CM

## 2018-11-12 HISTORY — PX: ESOPHAGOGASTRODUODENOSCOPY (EGD) WITH PROPOFOL: SHX5813

## 2018-11-12 LAB — HEPATIC FUNCTION PANEL
ALT: 41 U/L (ref 0–44)
AST: 136 U/L — ABNORMAL HIGH (ref 15–41)
Albumin: 2.4 g/dL — ABNORMAL LOW (ref 3.5–5.0)
Alkaline Phosphatase: 55 U/L (ref 38–126)
Bilirubin, Direct: 2 mg/dL — ABNORMAL HIGH (ref 0.0–0.2)
Indirect Bilirubin: 2.4 mg/dL — ABNORMAL HIGH (ref 0.3–0.9)
Total Bilirubin: 4.4 mg/dL — ABNORMAL HIGH (ref 0.3–1.2)
Total Protein: 5.3 g/dL — ABNORMAL LOW (ref 6.5–8.1)

## 2018-11-12 LAB — CBC
HCT: 31.3 % — ABNORMAL LOW (ref 36.0–46.0)
HCT: 32.6 % — ABNORMAL LOW (ref 36.0–46.0)
Hemoglobin: 10.7 g/dL — ABNORMAL LOW (ref 12.0–15.0)
Hemoglobin: 10.8 g/dL — ABNORMAL LOW (ref 12.0–15.0)
MCH: 30.3 pg (ref 26.0–34.0)
MCH: 29.8 pg (ref 26.0–34.0)
MCHC: 34.2 g/dL (ref 30.0–36.0)
MCHC: 33.1 g/dL (ref 30.0–36.0)
MCV: 88.7 fL (ref 80.0–100.0)
MCV: 90.1 fL (ref 80.0–100.0)
Platelets: 36 10*3/uL — ABNORMAL LOW (ref 150–400)
Platelets: 28 10*3/uL — CL (ref 150–400)
RBC: 3.53 MIL/uL — ABNORMAL LOW (ref 3.87–5.11)
RBC: 3.62 MIL/uL — ABNORMAL LOW (ref 3.87–5.11)
RDW: 16.7 % — ABNORMAL HIGH (ref 11.5–15.5)
RDW: 17 % — ABNORMAL HIGH (ref 11.5–15.5)
WBC: 12.6 10*3/uL — ABNORMAL HIGH (ref 4.0–10.5)
WBC: 9.1 10*3/uL (ref 4.0–10.5)
nRBC: 0 % (ref 0.0–0.2)
nRBC: 0 % (ref 0.0–0.2)

## 2018-11-12 LAB — CBC WITH DIFFERENTIAL/PLATELET
Abs Immature Granulocytes: 0.03 10*3/uL (ref 0.00–0.07)
Basophils Absolute: 0 10*3/uL (ref 0.0–0.1)
Basophils Relative: 0 %
Eosinophils Absolute: 0 10*3/uL (ref 0.0–0.5)
Eosinophils Relative: 0 %
HCT: 29.8 % — ABNORMAL LOW (ref 36.0–46.0)
Hemoglobin: 10.1 g/dL — ABNORMAL LOW (ref 12.0–15.0)
Immature Granulocytes: 0 %
Lymphocytes Relative: 5 %
Lymphs Abs: 0.4 10*3/uL — ABNORMAL LOW (ref 0.7–4.0)
MCH: 30.5 pg (ref 26.0–34.0)
MCHC: 33.9 g/dL (ref 30.0–36.0)
MCV: 90 fL (ref 80.0–100.0)
Monocytes Absolute: 0.8 10*3/uL (ref 0.1–1.0)
Monocytes Relative: 10 %
Neutro Abs: 7.2 10*3/uL (ref 1.7–7.7)
Neutrophils Relative %: 85 %
Platelets: 28 10*3/uL — CL (ref 150–400)
RBC: 3.31 MIL/uL — ABNORMAL LOW (ref 3.87–5.11)
RDW: 16.9 % — ABNORMAL HIGH (ref 11.5–15.5)
WBC: 8.5 10*3/uL (ref 4.0–10.5)

## 2018-11-12 LAB — PROTIME-INR
INR: 1.7 — ABNORMAL HIGH (ref 0.8–1.2)
Prothrombin Time: 20 seconds — ABNORMAL HIGH (ref 11.4–15.2)

## 2018-11-12 LAB — BASIC METABOLIC PANEL
Anion gap: 5 (ref 5–15)
BUN: 11 mg/dL (ref 6–20)
CO2: 22 mmol/L (ref 22–32)
Calcium: 7 mg/dL — ABNORMAL LOW (ref 8.9–10.3)
Chloride: 112 mmol/L — ABNORMAL HIGH (ref 98–111)
Creatinine, Ser: 0.54 mg/dL (ref 0.44–1.00)
GFR calc Af Amer: >60 mL/min (ref >60)
GFR calc non Af Amer: >60 mL/min (ref >60)
Glucose, Bld: 131 mg/dL — ABNORMAL HIGH (ref 70–99)
Potassium: 4 mmol/L (ref 3.5–5.1)
Sodium: 139 mmol/L (ref 135–145)

## 2018-11-12 LAB — URINE CULTURE
Culture: NO GROWTH
Special Requests: NORMAL

## 2018-11-12 LAB — BPAM RBC
Blood Product Expiration Date: 202009012359
Blood Product Expiration Date: 202009082359
Blood Product Expiration Date: 202009092359
Blood Product Expiration Date: 202009152359
Blood Product Expiration Date: 202009152359
Blood Product Expiration Date: 202009172359
Blood Product Expiration Date: 202009182359
ISSUE DATE / TIME: 202008190210
ISSUE DATE / TIME: 202008190210
ISSUE DATE / TIME: 202008190335
ISSUE DATE / TIME: 202008190438
ISSUE DATE / TIME: 202008190438
ISSUE DATE / TIME: 202008190749
ISSUE DATE / TIME: 202008190749
Unit Type and Rh: 5100
Unit Type and Rh: 5100
Unit Type and Rh: 5100
Unit Type and Rh: 6200
Unit Type and Rh: 6200
Unit Type and Rh: 6200
Unit Type and Rh: 6200

## 2018-11-12 LAB — TYPE AND SCREEN
ABO/RH(D): A POS
Antibody Screen: NEGATIVE
Unit division: 0
Unit division: 0
Unit division: 0
Unit division: 0
Unit division: 0
Unit division: 0
Unit division: 0

## 2018-11-12 LAB — GLUCOSE, CAPILLARY
Glucose-Capillary: 114 mg/dL — ABNORMAL HIGH (ref 70–99)
Glucose-Capillary: 124 mg/dL — ABNORMAL HIGH (ref 70–99)
Glucose-Capillary: 122 mg/dL — ABNORMAL HIGH (ref 70–99)
Glucose-Capillary: 131 mg/dL — ABNORMAL HIGH (ref 70–99)
Glucose-Capillary: 141 mg/dL — ABNORMAL HIGH (ref 70–99)

## 2018-11-12 LAB — BPAM FFP
Blood Product Expiration Date: 202008242359
Blood Product Expiration Date: 202008242359
ISSUE DATE / TIME: 202008190750
ISSUE DATE / TIME: 202008191057
Unit Type and Rh: 6200
Unit Type and Rh: 6200

## 2018-11-12 LAB — PREPARE RBC (CROSSMATCH)

## 2018-11-12 LAB — BPAM PLATELET PHERESIS
Blood Product Expiration Date: 202008202359
ISSUE DATE / TIME: 202008190751
Unit Type and Rh: 6200

## 2018-11-12 LAB — PREPARE FRESH FROZEN PLASMA
Unit division: 0
Unit division: 0

## 2018-11-12 LAB — PREPARE PLATELET PHERESIS: Unit division: 0

## 2018-11-12 LAB — FIBRINOGEN: Fibrinogen: 159 mg/dL — ABNORMAL LOW (ref 210–475)

## 2018-11-12 LAB — FIBRIN DERIVATIVES D-DIMER (ARMC ONLY): Fibrin derivatives D-dimer (ARMC): >7500 ng/mL (FEU) — ABNORMAL HIGH (ref 0.00–499.00)

## 2018-11-12 LAB — PROCALCITONIN: Procalcitonin: 0.18 ng/mL

## 2018-11-12 LAB — TRIGLYCERIDES: Triglycerides: 164 mg/dL — ABNORMAL HIGH (ref <150)

## 2018-11-12 SURGERY — ESOPHAGOGASTRODUODENOSCOPY (EGD) WITH PROPOFOL
Anesthesia: General

## 2018-11-12 MED ORDER — LORAZEPAM 2 MG/ML IJ SOLN
INTRAMUSCULAR | Status: AC
  Filled 2018-11-12: qty 1

## 2018-11-12 MED ORDER — ACETAMINOPHEN 650 MG RE SUPP: 650.0000 mg | Freq: Four times a day (QID) | RECTAL | Status: DC | PRN

## 2018-11-12 MED ORDER — PANTOPRAZOLE SODIUM 40 MG IV SOLR
40.0000 mg | Freq: Two times a day (BID) | INTRAVENOUS | Status: DC
  Administered 2018-11-12 – 2018-11-13 (×2): 40 mg via INTRAVENOUS
  Filled 2018-11-12 (×2): qty 40

## 2018-11-12 MED ORDER — LORAZEPAM 1 MG PO TABS: 1.0000 mg | ORAL_TABLET | Freq: Four times a day (QID) | ORAL | Status: DC | PRN

## 2018-11-12 MED ORDER — SODIUM CHLORIDE 0.9% IV SOLUTION: Freq: Once | INTRAVENOUS | Status: DC

## 2018-11-12 MED ORDER — LACTULOSE 10 GM/15ML PO SOLN
20.0000 g | Freq: Three times a day (TID) | ORAL | Status: DC
  Administered 2018-11-12 – 2018-11-13 (×3): 20 g via ORAL
  Filled 2018-11-12 (×3): qty 30

## 2018-11-12 MED ORDER — LORAZEPAM 2 MG/ML IJ SOLN
2.0000 mg | Freq: Once | INTRAMUSCULAR | Status: AC
  Administered 2018-11-12: 09:00:00 2 mg via INTRAVENOUS

## 2018-11-12 MED ORDER — RIFAXIMIN 550 MG PO TABS
550.0000 mg | ORAL_TABLET | Freq: Two times a day (BID) | ORAL | Status: DC
  Administered 2018-11-12 – 2018-11-13 (×2): 550 mg via ORAL
  Filled 2018-11-12 (×2): qty 1

## 2018-11-12 MED ORDER — ENSURE ENLIVE PO LIQD
237.0000 mL | Freq: Two times a day (BID) | ORAL | Status: DC
  Administered 2018-11-12 – 2018-11-13 (×2): 237 mL via ORAL

## 2018-11-12 MED ORDER — SODIUM CHLORIDE 0.9 % IV SOLN: INTRAVENOUS | Status: DC

## 2018-11-12 MED ORDER — ACETAMINOPHEN 325 MG PO TABS
650.0000 mg | ORAL_TABLET | Freq: Four times a day (QID) | ORAL | Status: DC | PRN
  Administered 2018-11-12: 17:00:00 650 mg
  Filled 2018-11-12: qty 2

## 2018-11-12 MED ORDER — LORAZEPAM 2 MG/ML IJ SOLN: 1.0000 mg | Freq: Four times a day (QID) | INTRAMUSCULAR | Status: DC | PRN

## 2018-11-12 MED ORDER — DEXMEDETOMIDINE HCL IN NACL 400 MCG/100ML IV SOLN: 0.4000 ug/kg/h | INTRAVENOUS | Status: DC

## 2018-11-12 NOTE — Progress Notes (Signed)
Patient waking up and following simple commands. Propofol decreased to 15mcg.

## 2018-11-12 NOTE — Progress Notes (Signed)
Patient extubated and placed on 2.5L nasal cannula.  Patient appears comfortable.  Patient with moderate cough post extubation.  VSS.  Nurse will continue to monitor.

## 2018-11-12 NOTE — H&P (Signed)
Jonathon Bellows, MD [address] [place], [place], [place], [address] [address], [place], [place], [place], [address] Phone: [phone]  Fax: [phone]  Primary Care Physician:  Patient, No Pcp Per   Pre-Procedure History & Physical: HPI:  Lisa Dennis is a 53 y.o. female is here for an endoscopy    Past Medical History:  Diagnosis Date  . Alcohol abuse   . Anxiety   . Cirrhosis [facility])     Past Surgical History:  Procedure Laterality Date  . ESOPHAGOGASTRODUODENOSCOPY (EGD) WITH PROPOFOL N/A 06/08/2018   Procedure: ESOPHAGOGASTRODUODENOSCOPY (EGD) WITH PROPOFOL;  Surgeon: Lucilla Lame, MD;  Location: [facility] ENDOSCOPY;  Service: Endoscopy;  Laterality: N/A;    Prior to Admission medications   Medication Sig Start Date End Date Taking? Authorizing Provider  folic acid (FOLVITE) 1 MG tablet Take 1 tablet (1 mg total) by mouth daily. 10/19/18   Henreitta Leber, MD  lactulose (CHRONULAC) 10 GM/15ML solution 20gm oral twice a day (goal is 3 bowel movements a day, can go to three or four times a day dosing if needed) 10/19/18   Sainani, Belia Heman, MD  pantoprazole (PROTONIX) 40 MG tablet Take 1 tablet (40 mg total) by mouth daily. 10/19/18   Henreitta Leber, MD  rifaximin (XIFAXAN) 550 MG TABS tablet Take 1 tablet (550 mg total) by mouth 2 (two) times daily. 10/19/18 11/18/18  Henreitta Leber, MD  thiamine 100 MG tablet Take 1 tablet (100 mg total) by mouth daily. 10/19/18   Henreitta Leber, MD    Allergies as of 11/11/2018 - Review Complete 11/11/2018  Allergen Reaction Noted  . Benadryl [diphenhydramine] Other (See Comments) 05/20/2018  . Latex Hives 10/19/2018  . Morphine and related Hives 05/20/2018  . Penicillins Hives and Other (See Comments) 05/20/2018  . Sulfa antibiotics Hives 05/20/2018  . Tetracyclines & related Hives 05/20/2018    No family history on file.  Social History   Socioeconomic History  . Marital status: Divorced    Spouse name: Not  on file  . Number of children: 1  . Years of education: Not on file  . Highest education level: Bachelor's degree (e.g., BA, AB, BS)  Occupational History  . Not on file  Social Needs  . Financial resource strain: Not hard at all  . Food insecurity    Worry: Never true    Inability: Never true  . Transportation needs    Medical: No    Non-medical: No  Tobacco Use  . Smoking status: Former Research scientist (life sciences)  . Smokeless tobacco: Never Used  Substance and Sexual Activity  . Alcohol use: Yes    Comment: heavy  . Drug use: Not on file  . Sexual activity: Not Currently  Lifestyle  . Physical activity    Days per week: 0 days    Minutes per session: 0 min  . Stress: Very much  Relationships  . Social connections    Talks on phone: More than three times a week    Gets together: Once a week    Attends religious service: Never    Active member of club or organization: No    Attends meetings of clubs or organizations: Never    Relationship status: Divorced  . Intimate partner violence    Fear of current or ex partner: No    Emotionally abused: No    Physically abused: No    Forced sexual activity: No  Other Topics Concern  . Not  on file  Social History Narrative   Pt recently relocated from [place] ago.    Review of Systems: See HPI, otherwise negative ROS  Physical Exam: BP 100/67   Pulse 98   Temp 98.6 F (37 C) (Bladder)   Resp 16   Ht 5' 4.02" (1.626 m)   Wt 65.8 kg   SpO2 94%   BMI 24.88 kg/m  General:   intubated Head:  Normocephalic and atraumatic. Neck:  Supple; no masses or thyromegaly. Lungs:  Clear throughout to auscultation, normal respiratory effort.    Heart:  +S1, +S2, Regular rate and rhythm, No edema. Abdomen:  Soft, nontender and nondistended. Normal bowel sounds, without guarding, and without rebound.   Neurologic:  Intubated and seated   Impression/Plan: Lisa Dennis is here for an endoscopy  to be performed for  evaluation of  upper GI bleed    Risks, benefits, limitations, and alternatives regarding endoscopy have been reviewed with the patient.  Questions have been answered.  All parties agreeable.   Jonathon Bellows, MD  11/12/2018, 9:05 AM

## 2018-11-12 NOTE — Progress Notes (Addendum)
EGD complete.  Dr. Alva Garnet at bedside to start ven when process.  Propofol decreased from 108mcg to 58mcg.

## 2018-11-12 NOTE — Progress Notes (Signed)
Patient extubated to 2.5l  per MD request with no complications.

## 2018-11-12 NOTE — Progress Notes (Addendum)
[facility] NAME: Lisa Dennis    MR#:  [id number]  DATE OF BIRTH:  November 27, 1965  SUBJECTIVE:  CHIEF COMPLAINT:   Chief Complaint  Patient presents with  . GI Bleeding    REVIEW OF SYSTEMS:  ROS Constitutional: Negative for chills and fever.  HENT: Negative for hearing loss and tinnitus.   Eyes: Negative for blurred vision and double vision.  Respiratory: Negative for cough and hemoptysis.   Cardiovascular: Negative for chest pain and palpitations.  Gastrointestinal: Negative for heartburn and vomiting.  Genitourinary: Negative for dysuria and urgency.  Musculoskeletal: Negative for myalgias and neck pain.  Skin: Negative for itching and rash.  Neurological: Negative for dizziness and headaches.  Psychiatric/Behavioral: Negative for depression and hallucinations.  DRUG ALLERGIES:   Allergies  Allergen Reactions  . Benadryl [Diphenhydramine] Other (See Comments)    Reaction: makes crazy and can't sleep for days  . Latex Hives  . Morphine And Related Hives  . Penicillins Hives and Other (See Comments)    Did it involve swelling of the face/tongue/throat, SOB, or low BP? No Did it involve sudden or severe rash/hives, skin peeling, or any reaction on the inside of your mouth or nose? Unknown Did you need to seek medical attention at a hospital or doctor's office? Unknown When did it last happen?unknown If all above answers are "NO", may proceed with cephalosporin use.   . Sulfa Antibiotics Hives  . Tetracyclines & Related Hives   VITALS:  Blood pressure 140/86, pulse 98, temperature 98.6 F (37 C), resp. rate 13, height 5' 4.02" (1.626 m), weight 65.8 kg, SpO2 95 %. PHYSICAL EXAMINATION:  Physical Exam  Constitutional: She is oriented to person, place, and time. She appears well-developed and well-nourished.  HENT:  Head: Normocephalic and atraumatic.  Right Ear: External ear normal.  Eyes: Pupils are  equal, round, and reactive to light. Conjunctivae are normal.  Neck: No thyromegaly present.  Cardiovascular: Normal rate, regular rhythm and normal heart sounds.  Respiratory: Effort normal and breath sounds normal. No respiratory distress.  GI: Soft. Bowel sounds are normal. She exhibits no distension.  Musculoskeletal: Normal range of motion.        General: No edema.  Neurological: She is alert and oriented to person, place, and time.  Skin: Skin is warm. She is not diaphoretic. No erythema.  Psychiatric: She has a normal mood and affect.   LABORATORY PANEL:  Female CBC Recent Labs  Lab 11/12/18 0526  WBC 12.6*  HGB 10.7*  HCT 31.3*  PLT 36*   ------------------------------------------------------------------------------------------------------------------ Chemistries  Recent Labs  Lab 11/12/18 0526  NA 139  K 4.0  CL 112*  CO2 22  GLUCOSE 131*  BUN 11  CREATININE 0.54  CALCIUM 7.0*  AST 136*  ALT 41  ALKPHOS 55  BILITOT 4.4*   RADIOLOGY:  Dg Chest Port 1 View  Result Date: 11/12/2018 CLINICAL DATA:  Respiratory failure EXAM: PORTABLE CHEST 1 VIEW COMPARISON:  11/11/2018 FINDINGS: Endotracheal tube and NG tube are unchanged. Low lung volumes. Mild cardiomegaly. Vascular congestion. Patchy bilateral airspace opacities have increased since prior study. IMPRESSION: Cardiomegaly, vascular congestion. Patchy bilateral areas of atelectasis or infiltrate. Low lung volumes. Electronically Signed   By: Rolm Baptise M.D.   On: 11/12/2018 10:04   ASSESSMENT AND PLAN:   1.  GI bleeding likely of upper GI etiology with coagulopathy likely secondary to her liver cirrhosis with subsequent acute blood loss anemia and  hypovolemic shock. -The patient was admitted to an ICU bed. --Patient was extubated this morning to oxygen via nasal cannula.  Awake and alert this morning. Seen by gastroenterologist this morning.  Plan is to continue IV PPI and octreotide.  IV antibiotics for SBP  prophylaxis in the setting of GI bleed in a cirrhotic patient Per GI On vitamin K 10 mg subcu daily x3 days. Patient needs to stop drinking any alcohol going forward. IV thiamine to prevent Wernicke's encephalopathy. Doppler ultrasound of the liver to rule out portal vein thrombosis.  Diagnostic paracentesis to rule out SBP. Plans for EGD by gastroenterologist Hemoglobin has improved from 3.8-10.7 following at least 8 units of packed red blood cells.  Patient was also given FFP and platelets.  2.  Lactic acidosis, hypokalemia and elevated AST with history of alcoholic cirrhosis. -Given her shock and the possibility of associated sepsis she was started on IV Rocephin and vancomycin after blood cultures. -We will continue her on IV cefepime and vancomycin and follow her lactic acid. -Her COVID 19 test came back negative.  3.  Alcohol abuse. Patient is to stop drinking. IV thiamine to prevent Wernicke's encephalopathy    DVT prophylaxis. -SCDs.    All the records are reviewed and case discussed with Care Management/Social Worker. Management plans discussed with the patient, family and they are in agreement.  CODE STATUS: Full Code  TOTAL TIME TAKING CARE OF THIS PATIENT: 36 minutes.   More than 50% of the time was spent in counseling/coordination of care: YES  POSSIBLE D/C IN 2 DAYS, DEPENDING ON CLINICAL CONDITION.   Alano Blasco M.D on 11/12/2018 at 4:56 PM  Between 7am to 6pm - Pager - [phone]  After 6pm go to [email or web address] - Proofreader  [facility]  Office  [phone]  CC: Primary care physician; Patient, No Pcp Per  Note: This dictation was prepared with Dragon dictation along with smaller phrase technology. Any transcriptional errors that result from this process are unintentional.

## 2018-11-12 NOTE — Progress Notes (Signed)
Spoke with Apolonio Schneiders RN re  D/c CVC order.  States she is aware and placed the order herself.  Has multiple infusions and will d/c line later this shift.

## 2018-11-12 NOTE — Progress Notes (Signed)
Patient very restless and anxious on vent  maxed out on propofol. Dr. Alva Garnet notified.

## 2018-11-12 NOTE — Progress Notes (Signed)
PULMONARY/CCM PROGRESS NOTE  PT PROFILE: 53 y.o. F history of alcoholic cirrhosis, esophageal varices, hepatic encephalopathy adm 8/19 via ED with acute UGIB and possible out of hospital cardiac arrest (family administered chest compressions, weak pulse present when EMS arrived).  Has received multiple units RBCs, FFP (for hepatic coagulopathy) and platelets.  Intubated in ED for depressed LOC  MAJOR EVENTS/TEST RESULTS: 08/19 admission as documented above 08/19 Seven units RBCs, 2 units FFP, 1 unit platelets. Intubated. Vasopressors.  08/19 US abdomen: Mild to moderate ascites 08/19 Gastroenterology consultation 08/20 EGD: Grade 1 varices in lower third of esophagus with no stigmata of recent bleed.  Moderate portal hypertensive gastropathy.  No acute bleeding. 08/20 Off vasopressors. Passed SBT and extubated. + F/C. NAD on [place] O2  INDWELLING DEVICES:: ETT 08/19 >> 08/20 R fem CVL 08/19 >> 08/20  MICRO DATA: SARS-CoV-2 8/19 >>negative MRSA PCR 8/19 >> positive Urine 8/19 >> negative  Resp 8/20 >>  Blood 8/19 >>   ANTIMICROBIALS:  Anti-infectives (From admission, onward)   Start     Dose/Rate Route Frequency Ordered Stop   11/11/18 1800  cefTRIAXone (ROCEPHIN) 1 g in sodium chloride 0.9 % 100 mL IVPB  Status:  Discontinued     1 g 200 mL/hr over 30 Minutes Intravenous Every 24 hours 11/11/18 0858 11/12/18 1231   11/11/18 1400  vancomycin (VANCOCIN) IVPB 750 mg/150 ml premix  Status:  Discontinued     750 mg 150 mL/hr over 60 Minutes Intravenous Every 12 hours 11/11/18 0610 11/11/18 0903   11/11/18 1000  rifaximin (XIFAXAN) tablet 550 mg  Status:  Discontinued     550 mg Oral 2 times daily 11/11/18 0442 11/11/18 0903   11/11/18 0800  ceFEPIme (MAXIPIME) 2 g in sodium chloride 0.9 % 100 mL IVPB  Status:  Discontinued     2 g 200 mL/hr over 30 Minutes Intravenous Every 12 hours 11/11/18 0442 11/11/18 0537   11/11/18 0630  metroNIDAZOLE (FLAGYL) IVPB 500 mg  Status:  Discontinued      500 mg 100 mL/hr over 60 Minutes Intravenous Every 8 hours 11/11/18 0600 11/11/18 0903   11/11/18 0630  ceFEPIme (MAXIPIME) 2 g in sodium chloride 0.9 % 100 mL IVPB  Status:  Discontinued     2 g 200 mL/hr over 30 Minutes Intravenous Every 8 hours 11/11/18 0602 11/11/18 0858   11/11/18 0400  vancomycin (VANCOCIN) IVPB 1000 mg/200 mL premix     1,000 mg 200 mL/hr over 60 Minutes Intravenous  Once 11/11/18 0358 11/11/18 0530   11/11/18 0315  cefTRIAXone (ROCEPHIN) 1 g in sodium chloride 0.9 % 100 mL IVPB     1 g 200 mL/hr over 30 Minutes Intravenous  Once 11/11/18 0305 11/11/18 0355       SUBJ: Passed SBT and extubated. + F/C. NAD on [place] O2   OBJ: Vitals:   11/12/18 0942 11/12/18 1015 11/12/18 1030 11/12/18 1035  BP: 110/80  (!) 143/91   Pulse: 91 90 (!) 105   Resp:  14 (!) 22   Temp:  98.2 F (36.8 C) 98.2 F (36.8 C)   TempSrc:  Bladder    SpO2:  93% 97% 94%  Weight:      Height:       Vent Mode: PRVC FiO2 (%):  [28 %] 28 % Set Rate:  [16 bmp] 16 bmp Vt Set:  [440 mL] 440 mL PEEP:  [5 cmH20] 5 cmH20 Pressure Support:  [5 cmH20-15 cmH20] 5 cmH20 Plateau Pressure:  [16  cmH20] 16 cmH20  Gen: Chronically ill-appearing, appears much older than true age.  RASS -1, + F/C. HEENT: NCAT, + sclericterus Neck: No LAN, no JVD noted Lungs: few rhonchi, no wheezes Cardiovascular: regular, no M Abdomen: Soft, NT, +BS Ext: sarcopenia, warm, no edema Neuro: PERRL, EOMI, motor/sensory grossly intact Skin: No lesions noted   BMP Latest Ref Rng & Units 11/12/2018 11/11/2018 11/11/2018  Glucose 70 - 99 mg/dL 131(H) 299(H) 130(H)  BUN 6 - 20 mg/dL '11 8 6  ' Creatinine 0.44 - 1.00 mg/dL 0.54 0.79 0.50  Sodium 135 - 145 mmol/L 139 135 141  Potassium 3.5 - 5.1 mmol/L 4.0 4.3 3.2(L)  Chloride 98 - 111 mmol/L 112(H) 108 114(H)  CO2 22 - 32 mmol/L 22 16(L) 19(L)  Calcium 8.9 - 10.3 mg/dL 7.0(L) 6.6(L) 7.0(L)    Hepatic Function Latest Ref Rng & Units 11/12/2018 11/11/2018 10/18/2018   Total Protein 6.5 - 8.1 g/dL 5.3(L) 4.2(L) 7.7  Albumin 3.5 - 5.0 g/dL 2.4(L) 1.6(L) 3.1(L)  AST 15 - 41 U/L 136(H) 73(H) 126(H)  ALT 0 - 44 U/L 41 20 38  Alk Phosphatase 38 - 126 U/L 55 46 99  Total Bilirubin 0.3 - 1.2 mg/dL 4.4(H) 3.3(H) 9.1(H)  Bilirubin, Direct 0.0 - 0.2 mg/dL 2.0(H) - -    CBC Latest Ref Rng & Units 11/12/2018 11/11/2018 11/11/2018  WBC 4.0 - 10.5 K/uL 12.6(H) 18.4(H) 5.5  Hemoglobin 12.0 - 15.0 g/dL 10.7(L) 11.9(L) 3.8(LL)  Hematocrit 36.0 - 46.0 % 31.3(L) 33.6(L) 13.0(LL)  Platelets 150 - 400 K/uL 36(L) 49(L) 58(L)    ABG    Component Value Date/Time   PHART 7.30 (L) 11/11/2018 0845   PCO2ART 35 11/11/2018 0845   PO2ART 84 11/11/2018 0845   HCO3 17.2 (L) 11/11/2018 0845   ACIDBASEDEF 8.4 (H) 11/11/2018 0845   O2SAT 95.1 11/11/2018 0845    CXR: Low lung volumes, retrocardiac opacity - infiltrate versus atelectasis   IMPRESSION: VDRF - intubated for altered MS  Passed SBT Hemorrhagic shock due to UGIB, resolved Mild AKI, resolved Mild metabolic acidosis, resolved Alcoholic cirrhosis with portal hypertension and portal gastropathy Acute blood loss anemia Severe thrombocytopenia Coagulopathy due to liver failure Acute encephalopathy, appears to be resolved   PLAN/REC: Extubated under my supervision today and appears to be tolerating well Supplemental oxygen as needed to maintain SPO2 >90% Mobilize out of bed Monitor BMET intermittently Monitor I/Os Correct electrolytes as indicated Advance diet as able Cont pantoprazole  Transfuse RBCs for Hgb < 7.0 or acute bleeding with hemodynamic instability DVT px: SCDs Monitor CBC intermittently Monitor temp, WBC count Micro and abx as above  Resume home doses of Lactulose and Rifaximin   CCM time: 40 mins The above time includes time spent in consultation with patient and/or family members and reviewing care plan on multidisciplinary rounds  Merton Border, MD PCCM service Mobile  [phone] Pager [phone] 11/12/2018 12:31 PM

## 2018-11-12 NOTE — Progress Notes (Addendum)
Nutrition Follow Up Note   DOCUMENTATION CODES:   Not applicable  INTERVENTION:   Ensure Enlive po BID, each supplement provides 350 kcal and 20 grams of protein  Recommend MVI daily   Pt likely at refeed risk; recommend monitor K, Mg and P labs daily until stable.   NUTRITION DIAGNOSIS:   Inadequate oral intake related to inability to eat as evidenced by NPO status. -resolving  GOAL:   Patient will meet greater than or equal to 90% of their needs  MONITOR:   PO intake, Supplement acceptance, Labs, Weight trends, Skin, I & O's  ASSESSMENT:   53 year old female with PMHx of EtOH abuse, cirrhosis, anxiety admitted with acute upper GI bleed, hemorrhagic shock requiring blood transfusions and vasopressors, intubated for airway protection in ED.   Pt s/p EGD 8/20; found to have Grade 1 varices and portal hypertensive gastropathy   Pt extubated today; tolerating well. Plan is to advance diet as tolerated. RD will add supplements and MVI to help pt meet her estimated needs.   Medications reviewed and include: lactulose, protonix, octreotide, thiamine   Labs reviewed: Ca 7.0(L) adj 8.28(L), alb 2.4(L), tbili 4.4(H) Wbc- 12.6(H), Hgb 10.7(L), Hct 31.3(L)  Diet Order:   Diet Order            Diet regular Room service appropriate? Yes; Fluid consistency: Thin  Diet effective 1400             EDUCATION NEEDS:   No education needs have been identified at this time  Skin:  Skin Assessment: Reviewed RN Assessment  Last BM:  unknown  Height:   Ht Readings from Last 1 Encounters:  11/12/18 5' 4.02" (1.626 m)   Weight:   Wt Readings from Last 1 Encounters:  11/11/18 65.8 kg   Ideal Body Weight:  54.5 kg  BMI:  Body mass index is 24.88 kg/m.  Estimated Nutritional Needs:   Kcal:  1700-1900kcal/day  Protein:  85-95g/day  Fluid:  >1.7L/day  Koleen Distance MS, RD, LDN Pager #- [phone] Office#- [phone] After Hours Pager: [phone]

## 2018-11-12 NOTE — Progress Notes (Signed)
Mother of patient called for consent for blood product transfusion.  Mother unaware of patient being in hospital.  Mother updated by nurse and Dr. Vicente Males.

## 2018-11-12 NOTE — Progress Notes (Signed)
Marda Stalker, NP notified of platelet count of 28.

## 2018-11-12 NOTE — Progress Notes (Signed)
propofol turned off, patient following simple commands, pulling good tidal volumes in spontaneous mode 5/5.  Per Dr. Alva Garnet extubate patient.

## 2018-11-12 NOTE — Progress Notes (Signed)
Lisa Dennis , MD [address], [place], [place], [place], [address] [address] [place], [place], [place], [address] Phone: [phone]  Fax: [place] is being followed for anemia  Day 1 of follow up   Subjective: No ovetrt bleeding seen, patient still intubated    Objective: Vital signs in last 24 hours: Vitals:   11/12/18 0500 11/12/18 0530 11/12/18 0600 11/12/18 0630  BP: 103/71 111/71 111/72 100/67  Pulse: 77 87 80 81  Resp: 16 (!) '24 17 17  ' Temp: 98.6 F (37 C) 98.4 F (36.9 C) 98.4 F (36.9 C) 98.4 F (36.9 C)  TempSrc:      SpO2: 96% 96% 96% 94%  Weight:      Height:       Weight change:   Intake/Output Summary (Last 24 hours) at 11/12/2018 0813 Last data filed at 11/12/2018 0600 Gross per 24 hour  Intake 4155.3 ml  Output 535 ml  Net 3620.3 ml     Exam: Heart:: Regular rate and rhythm, S1S2 present or without murmur or extra heart sounds Lungs: normal, clear to auscultation and clear to auscultation and percussion Abdomen: soft, nontender, normal bowel sounds   Lab Results: '@LABTEST2' @ Micro Results: Recent Results (from the past 240 hour(s))  SARS Coronavirus 2 [facility] order, Performed in [facility] hospital lab) Nasopharyngeal Nasopharyngeal Swab     Status: None   Collection Time: 11/11/18  2:26 AM   Specimen: Nasopharyngeal Swab  Result Value Ref Range Status   SARS Coronavirus 2 NEGATIVE NEGATIVE Final    Comment: (NOTE) If result is NEGATIVE SARS-CoV-2 target nucleic acids are NOT DETECTED. The SARS-CoV-2 RNA is generally detectable in upper and lower  respiratory specimens during the acute phase of infection. The lowest  concentration of SARS-CoV-2 viral copies this assay can detect is 250  copies / mL. A negative result does not preclude SARS-CoV-2 infection  and should not be used as the sole basis for treatment or other  patient management decisions.  A negative result may occur with  improper  specimen collection / handling, submission of specimen other  than nasopharyngeal swab, presence of viral mutation(s) within the  areas targeted by this assay, and inadequate number of viral copies  (<250 copies / mL). A negative result must be combined with clinical  observations, patient history, and epidemiological information. If result is POSITIVE SARS-CoV-2 target nucleic acids are DETECTED. The SARS-CoV-2 RNA is generally detectable in upper and lower  respiratory specimens dur ing the acute phase of infection.  Positive  results are indicative of active infection with SARS-CoV-2.  Clinical  correlation with patient history and other diagnostic information is  necessary to determine patient infection status.  Positive results do  not rule out bacterial infection or co-infection with other viruses. If result is PRESUMPTIVE POSTIVE SARS-CoV-2 nucleic acids MAY BE PRESENT.   A presumptive positive result was obtained on the submitted specimen  and confirmed on repeat testing.  While 2019 novel coronavirus  (SARS-CoV-2) nucleic acids may be present in the submitted sample  additional confirmatory testing may be necessary for epidemiological  and / or clinical management purposes  to differentiate between  SARS-CoV-2 and other Sarbecovirus currently known to infect humans.  If clinically indicated additional testing with an alternate test  methodology [phone]) is advised. The SARS-CoV-2 RNA is generally  detectable in upper and lower respiratory sp ecimens during the acute  phase of infection. The expected result is Negative.  Fact Sheet for Patients:  StrictlyIdeas.no Fact Sheet for Healthcare Providers: BankingDealers.co.za This test is not yet approved or cleared by the [place] FDA and has been authorized for detection and/or diagnosis of SARS-CoV-2 by FDA under an Emergency Use Authorization (EUA).  This EUA will remain in  effect (meaning this test can be used) for the duration of the COVID-19 declaration under Section 564(b)(1) of the Act, 21 U.S.C. section 360bbb-3(b)(1), unless the authorization is terminated or revoked sooner. Performed at [facility], [address] [place], [place] [address]   Blood culture (routine x 2)     Status: None (Preliminary result)   Collection Time: 11/11/18  2:26 AM   Specimen: BLOOD  Result Value Ref Range Status   Specimen Description BLOOD ARTHROGRAPHIS SPECIES  Final   Special Requests   Final    BOTTLES DRAWN AEROBIC AND ANAEROBIC Blood Culture results may not be optimal due to an excessive volume of blood received in culture bottles   Culture   Final    NO GROWTH < 12 HOURS Performed at [facility], [address] [place], [place] [address]    Report Status PENDING  Incomplete  Urine Culture     Status: None   Collection Time: 11/11/18  5:35 AM   Specimen: Urine, Random  Result Value Ref Range Status   Specimen Description   Final    URINE, RANDOM Performed at [facility], [address] [place], [place] [address]    Special Requests   Final    Normal Performed at [facility], [address], [place], [place] [address]    Culture   Final    NO GROWTH Performed at [facility], [place] [address] [place], [place] [address]    Report Status 11/12/2018 FINAL  Final  MRSA PCR Screening     Status: Abnormal   Collection Time: 11/11/18  6:02 AM   Specimen: Nasal Mucosa; Nasopharyngeal  Result Value Ref Range Status   MRSA by PCR POSITIVE (A) NEGATIVE Final    Comment:        The GeneXpert MRSA Assay (FDA approved for NASAL specimens only), is one component of a comprehensive MRSA colonization surveillance program. It is not intended to diagnose MRSA infection nor to guide or monitor treatment for MRSA infections. RESULT CALLED TO, READ BACK BY AND VERIFIED WITH: Casey Burkitt AT 5726 ON 11/11/2018 JJB  Performed at [facility], [address] [place], [place] [address]    Studies/Results: Dg Chest 1 View  Result Date: 11/11/2018 CLINICAL DATA:  Post intubation. GI bleed. EXAM: CHEST  1 VIEW COMPARISON:  Radiograph 08/28/2018 FINDINGS: Endotracheal tube tip 2.5 cm from the carina at the thoracic inlet. Tip and side port of the enteric tube below the diaphragm in the stomach. Low lung volumes. Streaky bibasilar opacities consistent with atelectasis. Normal heart size and mediastinal contours. No large pleural effusion or pneumothorax. No pulmonary edema. IMPRESSION: 1. Endotracheal tube tip 2.5 cm from the carina at the thoracic inlet. Enteric tube in place with tip and side-port below the diaphragm. 2. Low lung volumes with bibasilar atelectasis. Electronically Signed   By: Keith Rake M.D.   On: 11/11/2018 03:30   [place] Ascites (abdomen Limited)  Result Date: 11/11/2018 CLINICAL DATA:  Abdominal distension EXAM: LIMITED ABDOMEN ULTRASOUND FOR ASCITES TECHNIQUE: Limited ultrasound survey for ascites was performed in all four abdominal quadrants. COMPARISON:  10/21/2018 FINDINGS: Scanning in the abdomen shows mild to moderate  ascites increased when compared with the prior MRI examination. Fluid is noted in all 4 quadrants. IMPRESSION: Mild to moderate ascites increased when compared with the prior MRI. Electronically Signed   By: Inez Catalina M.D.   On: 11/11/2018 12:14   Medications: I have reviewed the patient's current medications. Scheduled Meds: . sodium chloride   Intravenous Once  . sodium chloride   Intravenous Once  . chlorhexidine gluconate (MEDLINE KIT)  15 mL Mouth Rinse BID  . Chlorhexidine Gluconate Cloth  6 each Topical Q0600  . insulin aspart  0-9 Units Subcutaneous Q4H  . mouth rinse  15 mL Mouth Rinse 10 times per day  . mupirocin ointment  1 application Nasal BID  . [START ON 11/14/2018] pantoprazole  40 mg Intravenous Q12H  . sodium chloride flush  10-40 mL  Intracatheter Q12H   Continuous Infusions: . sodium chloride    . cefTRIAXone (ROCEPHIN)  IV Stopped (11/11/18 1851)  . norepinephrine (LEVOPHED) Adult infusion 2 mcg/min (11/12/18 0600)  . octreotide  (SANDOSTATIN)    IV infusion 50 mcg/hr (11/12/18 0742)  . pantoprozole (PROTONIX) infusion 8 mg/hr (11/12/18 0600)  . phytonadione (VITAMIN K) IV    . propofol (DIPRIVAN) infusion 50 mcg/kg/min (11/12/18 0600)  . banana bag IV 1000 mL Stopped (11/11/18 2041)  . thiamine injection Stopped (11/11/18 1018)   PRN Meds:.acetaminophen **OR** acetaminophen, fentaNYL (SUBLIMAZE) injection, [DISCONTINUED] ondansetron **OR** ondansetron (ZOFRAN) IV, sodium chloride flush, traZODone  Anyiah Coverdale is a 53 y.o. y/o female with a history of alcoholic liver cirrhosis with grade 1 esophageal varices and portal hypertensive gastropathy presents to the emergency room in hypotensive shock and bloody vomitus.  Subsequently intubated and NG tube aspirate has been negative for bleeding.  Hemoglobin was very low at 3.8 g. Surprisingly had a normal BUN/creatinine ratio.  INR 2.5 suggesting of acute liver failure.  Transaminases are only mildly elevated with AST of 73 and total bilirubin of 3.3.     Plan 1.  Monitor CBC and transfuse.  Continue IV PPI and octreotide.  IV antibiotics for SBP prophylaxis in the setting of GI bleed in a cirrhotic patient.  2.  Vitamin K 10 mg subcutaneous daily for 3 days to replace any malnutrition related vitamin K deficiencies contributing to elevated INR.  3.  She absolutely needs to stop drinking any alcohol.  4.  IV thiamine to prevent any Wernicke's encephalopathy.  5.  Check ultrasound Doppler of the liver to rule out portal vein thrombosis, Tylenol levels, diagnostic paracentesis to rule out SBP  6. IF does not wake up then will need CT scan to rule out sub dural hematoma.   7. EGD today   I have discussed alternative options, risks & benefits,  which  include, but are not limited to, bleeding, infection, perforation,respiratory complication & drug reaction.  The patients mother  agrees with this plan & written consent will be obtained.        LOS: 1 day   Lisa Bellows, MD 11/12/2018, 8:13 AM

## 2018-11-12 NOTE — Progress Notes (Signed)
One time dose of IV ativan given and propofol increased to 46mcg per MD order  Patient resting more comfortably.  EGS staff at bedside setting up for upper endoscopy.

## 2018-11-12 NOTE — Op Note (Addendum)
[facility] Gastroenterology Patient Name: Lisa Dennis Procedure Date: 11/12/2018 9:18 AM MRN: [id number] Account #: [id number] Date of Birth: [birth year] Admit Type: Inpatient Age: 53 Room: [facility] ENDO ROOM 4 Gender: Female Note Status: Finalized Procedure:            Upper GI endoscopy Indications:          Iron deficiency anemia secondary to chronic blood loss Providers:            Jonathon Bellows MD, MD Referring MD:         No Local Md, MD (Referring MD) Medicines:            General Anesthesia Complications:        No immediate complications. Procedure:            Pre-Anesthesia Assessment:                       - Prior to the procedure, a History and Physical was                        performed, and patient medications, allergies and                        sensitivities were reviewed. The patient's tolerance of                        previous anesthesia was reviewed.                       - The risks and benefits of the procedure and the                        sedation options and risks were discussed with the                        patient. All questions were answered and informed                        consent was obtained.                       - ASA Grade Assessment: IV - A patient with severe                        systemic disease that is a constant threat to life.                       After obtaining informed consent, the endoscope was                        passed under direct vision. Throughout the procedure,                        the patient's blood pressure, pulse, and oxygen                        saturations were monitored continuously. The Endoscope                        was introduced through the mouth, and advanced to the  third part of duodenum. The upper GI endoscopy was                        accomplished with ease. The patient tolerated the                        procedure well. Findings:      The examined  duodenum was normal.      Grade I varices were found in the lower third of the esophagus. They       were small in size. no stigmata of recent bl.eed      Moderate portal hypertensive gastropathy was found in the entire       examined stomach.      The cardia and gastric fundus were normal on retroflexion. Impression:           - Normal examined duodenum.                       - Grade I esophageal varices.                       - Portal hypertensive gastropathy.                       - No specimens collected. Recommendation:       - Return patient to hospital ward for ongoing care.                       - Clear liquid diet today.                       - Continue present medications.                       - Commence on nadolol close to discharge for PHG.                       Outpatient colonoscopy +/- capsule study                       Closely monitor CBC- D/c octreotide and change to IV                        PPI till she can eat and drink Procedure Code(s):    --- Professional ---                       [phone], Esophagogastroduodenoscopy, flexible, transoral;                        diagnostic, including collection of specimen(s) by                        brushing or washing, when performed (separate procedure) Diagnosis Code(s):    --- Professional ---                       I85.00, Esophageal varices without bleeding                       K76.6, Portal hypertension  K31.89, Other diseases of stomach and duodenum                       D50.0, Iron deficiency anemia secondary to blood loss                        (chronic) CPT copyright 2019 [employer]. All rights reserved. The codes documented in this report are preliminary and upon coder review may  be revised to meet current compliance requirements. Jonathon Bellows, MD Jonathon Bellows MD, MD 11/12/2018 9:52:07 AM This report has been signed electronically. Number of Addenda: 0 Note Initiated On:  11/12/2018 9:18 AM Estimated Blood Loss: Estimated blood loss: none.      [facility]

## 2018-11-13 ENCOUNTER — Inpatient Hospital Stay: Payer: BLUE CROSS/BLUE SHIELD

## 2018-11-13 ENCOUNTER — Encounter: Payer: Self-pay | Admitting: Gastroenterology

## 2018-11-13 ENCOUNTER — Inpatient Hospital Stay (HOSPITAL_COMMUNITY)
Admission: AD | Admit: 2018-11-13 | Discharge: 2018-11-23 | DRG: 299 | Disposition: A | Discharge status: Other Acute Inpatient Hospital | Payer: BLUE CROSS/BLUE SHIELD | Source: Other Acute Inpatient Hospital | Attending: Internal Medicine | Admitting: Internal Medicine

## 2018-11-13 DIAGNOSIS — K521 Toxic gastroenteritis and colitis: Secondary | ICD-10-CM | POA: Diagnosis not present

## 2018-11-13 DIAGNOSIS — T473X5A Adverse effect of saline and osmotic laxatives, initial encounter: Secondary | ICD-10-CM | POA: Diagnosis not present

## 2018-11-13 DIAGNOSIS — Z882 Allergy status to sulfonamides status: Secondary | ICD-10-CM | POA: Diagnosis not present

## 2018-11-13 DIAGNOSIS — K7040 Alcoholic hepatic failure without coma: Secondary | ICD-10-CM | POA: Diagnosis present

## 2018-11-13 DIAGNOSIS — M25519 Pain in unspecified shoulder: Secondary | ICD-10-CM | POA: Diagnosis not present

## 2018-11-13 DIAGNOSIS — F419 Anxiety disorder, unspecified: Secondary | ICD-10-CM | POA: Diagnosis present

## 2018-11-13 DIAGNOSIS — K922 Gastrointestinal hemorrhage, unspecified: Secondary | ICD-10-CM | POA: Diagnosis present

## 2018-11-13 DIAGNOSIS — K7290 Hepatic failure, unspecified without coma: Secondary | ICD-10-CM | POA: Diagnosis present

## 2018-11-13 DIAGNOSIS — K766 Portal hypertension: Secondary | ICD-10-CM | POA: Diagnosis present

## 2018-11-13 DIAGNOSIS — F1010 Alcohol abuse, uncomplicated: Secondary | ICD-10-CM | POA: Diagnosis present

## 2018-11-13 DIAGNOSIS — R509 Fever, unspecified: Secondary | ICD-10-CM | POA: Diagnosis not present

## 2018-11-13 DIAGNOSIS — K652 Spontaneous bacterial peritonitis: Secondary | ICD-10-CM | POA: Diagnosis present

## 2018-11-13 DIAGNOSIS — K661 Hemoperitoneum: Secondary | ICD-10-CM | POA: Diagnosis present

## 2018-11-13 DIAGNOSIS — I8511 Secondary esophageal varices with bleeding: Secondary | ICD-10-CM | POA: Diagnosis present

## 2018-11-13 DIAGNOSIS — R58 Hemorrhage, not elsewhere classified: Secondary | ICD-10-CM

## 2018-11-13 DIAGNOSIS — Z79899 Other long term (current) drug therapy: Secondary | ICD-10-CM | POA: Diagnosis not present

## 2018-11-13 DIAGNOSIS — Z880 Allergy status to penicillin: Secondary | ICD-10-CM

## 2018-11-13 DIAGNOSIS — E46 Unspecified protein-calorie malnutrition: Secondary | ICD-10-CM | POA: Diagnosis present

## 2018-11-13 DIAGNOSIS — Z881 Allergy status to other antibiotic agents status: Secondary | ICD-10-CM

## 2018-11-13 DIAGNOSIS — K2921 Alcoholic gastritis with bleeding: Secondary | ICD-10-CM | POA: Diagnosis not present

## 2018-11-13 DIAGNOSIS — Z20828 Contact with and (suspected) exposure to other viral communicable diseases: Secondary | ICD-10-CM | POA: Diagnosis present

## 2018-11-13 DIAGNOSIS — K449 Diaphragmatic hernia without obstruction or gangrene: Secondary | ICD-10-CM | POA: Diagnosis present

## 2018-11-13 DIAGNOSIS — I868 Varicose veins of other specified sites: Secondary | ICD-10-CM | POA: Diagnosis present

## 2018-11-13 DIAGNOSIS — D689 Coagulation defect, unspecified: Secondary | ICD-10-CM | POA: Diagnosis present

## 2018-11-13 DIAGNOSIS — Z87891 Personal history of nicotine dependence: Secondary | ICD-10-CM | POA: Diagnosis not present

## 2018-11-13 DIAGNOSIS — I361 Nonrheumatic tricuspid (valve) insufficiency: Secondary | ICD-10-CM | POA: Diagnosis not present

## 2018-11-13 DIAGNOSIS — I998 Other disorder of circulatory system: Secondary | ICD-10-CM

## 2018-11-13 DIAGNOSIS — Z91040 Latex allergy status: Secondary | ICD-10-CM | POA: Diagnosis not present

## 2018-11-13 DIAGNOSIS — R161 Splenomegaly, not elsewhere classified: Secondary | ICD-10-CM | POA: Diagnosis present

## 2018-11-13 DIAGNOSIS — D6959 Other secondary thrombocytopenia: Secondary | ICD-10-CM | POA: Diagnosis present

## 2018-11-13 DIAGNOSIS — K7460 Unspecified cirrhosis of liver: Secondary | ICD-10-CM | POA: Diagnosis present

## 2018-11-13 DIAGNOSIS — K7031 Alcoholic cirrhosis of liver with ascites: Secondary | ICD-10-CM | POA: Diagnosis present

## 2018-11-13 DIAGNOSIS — I639 Cerebral infarction, unspecified: Secondary | ICD-10-CM | POA: Diagnosis not present

## 2018-11-13 DIAGNOSIS — K920 Hematemesis: Secondary | ICD-10-CM | POA: Diagnosis not present

## 2018-11-13 LAB — CBC WITH DIFFERENTIAL/PLATELET
Abs Immature Granulocytes: 0.06 10*3/uL (ref 0.00–0.07)
Basophils Absolute: 0.1 10*3/uL (ref 0.0–0.1)
Basophils Relative: 1 %
Eosinophils Absolute: 0.1 10*3/uL (ref 0.0–0.5)
Eosinophils Relative: 1 %
HCT: 31.5 % — ABNORMAL LOW (ref 36.0–46.0)
Hemoglobin: 10.1 g/dL — ABNORMAL LOW (ref 12.0–15.0)
Immature Granulocytes: 1 %
Lymphocytes Relative: 14 %
Lymphs Abs: 1 10*3/uL (ref 0.7–4.0)
MCH: 30.3 pg (ref 26.0–34.0)
MCHC: 32.1 g/dL (ref 30.0–36.0)
MCV: 94.6 fL (ref 80.0–100.0)
Monocytes Absolute: 0.8 10*3/uL (ref 0.1–1.0)
Monocytes Relative: 12 %
Neutro Abs: 5.2 10*3/uL (ref 1.7–7.7)
Neutrophils Relative %: 71 %
Platelets: 59 10*3/uL — ABNORMAL LOW (ref 150–400)
RBC: 3.33 MIL/uL — ABNORMAL LOW (ref 3.87–5.11)
RDW: 17.7 % — ABNORMAL HIGH (ref 11.5–15.5)
WBC: 7.2 10*3/uL (ref 4.0–10.5)
nRBC: 0 % (ref 0.0–0.2)

## 2018-11-13 LAB — CBC
HCT: 30.4 % — ABNORMAL LOW (ref 36.0–46.0)
HCT: 31.7 % — ABNORMAL LOW (ref 36.0–46.0)
Hemoglobin: 10.1 g/dL — ABNORMAL LOW (ref 12.0–15.0)
Hemoglobin: 10.4 g/dL — ABNORMAL LOW (ref 12.0–15.0)
MCH: 30.4 pg (ref 26.0–34.0)
MCH: 30.6 pg (ref 26.0–34.0)
MCHC: 33.2 g/dL (ref 30.0–36.0)
MCHC: 32.8 g/dL (ref 30.0–36.0)
MCV: 91.6 fL (ref 80.0–100.0)
MCV: 93.2 fL (ref 80.0–100.0)
Platelets: 75 10*3/uL — ABNORMAL LOW (ref 150–400)
Platelets: 58 10*3/uL — ABNORMAL LOW (ref 150–400)
RBC: 3.32 MIL/uL — ABNORMAL LOW (ref 3.87–5.11)
RBC: 3.4 MIL/uL — ABNORMAL LOW (ref 3.87–5.11)
RDW: 17.2 % — ABNORMAL HIGH (ref 11.5–15.5)
RDW: 17.7 % — ABNORMAL HIGH (ref 11.5–15.5)
WBC: 8.3 10*3/uL (ref 4.0–10.5)
WBC: 7.4 10*3/uL (ref 4.0–10.5)
nRBC: 0 % (ref 0.0–0.2)
nRBC: 0 % (ref 0.0–0.2)

## 2018-11-13 LAB — COMPREHENSIVE METABOLIC PANEL
ALT: 40 U/L (ref 0–44)
ALT: 35 U/L (ref 0–44)
AST: 115 U/L — ABNORMAL HIGH (ref 15–41)
AST: 89 U/L — ABNORMAL HIGH (ref 15–41)
Albumin: 2.8 g/dL — ABNORMAL LOW (ref 3.5–5.0)
Albumin: 2.8 g/dL — ABNORMAL LOW (ref 3.5–5.0)
Alkaline Phosphatase: 64 U/L (ref 38–126)
Alkaline Phosphatase: 62 U/L (ref 38–126)
Anion gap: 6 (ref 5–15)
Anion gap: 10 (ref 5–15)
BUN: 13 mg/dL (ref 6–20)
BUN: 6 mg/dL (ref 6–20)
CO2: 25 mmol/L (ref 22–32)
CO2: 23 mmol/L (ref 22–32)
Calcium: 7.7 mg/dL — ABNORMAL LOW (ref 8.9–10.3)
Calcium: 8.2 mg/dL — ABNORMAL LOW (ref 8.9–10.3)
Chloride: 108 mmol/L (ref 98–111)
Chloride: 104 mmol/L (ref 98–111)
Creatinine, Ser: 0.41 mg/dL — ABNORMAL LOW (ref 0.44–1.00)
Creatinine, Ser: 0.51 mg/dL (ref 0.44–1.00)
GFR calc Af Amer: >60 mL/min (ref >60)
GFR calc Af Amer: >60 mL/min (ref >60)
GFR calc non Af Amer: >60 mL/min (ref >60)
GFR calc non Af Amer: >60 mL/min (ref >60)
Glucose, Bld: 120 mg/dL — ABNORMAL HIGH (ref 70–99)
Glucose, Bld: 134 mg/dL — ABNORMAL HIGH (ref 70–99)
Potassium: 3.2 mmol/L — ABNORMAL LOW (ref 3.5–5.1)
Potassium: 3.7 mmol/L (ref 3.5–5.1)
Sodium: 139 mmol/L (ref 135–145)
Sodium: 137 mmol/L (ref 135–145)
Total Bilirubin: 6.3 mg/dL — ABNORMAL HIGH (ref 0.3–1.2)
Total Bilirubin: 8.8 mg/dL — ABNORMAL HIGH (ref 0.3–1.2)
Total Protein: 6.3 g/dL — ABNORMAL LOW (ref 6.5–8.1)
Total Protein: 6.5 g/dL (ref 6.5–8.1)

## 2018-11-13 LAB — PREPARE PLATELET PHERESIS: Unit division: 0

## 2018-11-13 LAB — PROTIME-INR
INR: 1.6 — ABNORMAL HIGH (ref 0.8–1.2)
Prothrombin Time: 18.9 seconds — ABNORMAL HIGH (ref 11.4–15.2)

## 2018-11-13 LAB — BODY FLUID CELL COUNT WITH DIFFERENTIAL
Eos, Fluid: 1 %
Lymphs, Fluid: 47 %
Monocyte-Macrophage-Serous Fluid: 21 %
Neutrophil Count, Fluid: 30 %
Total Nucleated Cell Count, Fluid: 2774 cu mm

## 2018-11-13 LAB — BPAM FFP
Blood Product Expiration Date: 202008252359
ISSUE DATE / TIME: 202008200908
Unit Type and Rh: 6200

## 2018-11-13 LAB — PHOSPHORUS
Phosphorus: 1.9 mg/dL — ABNORMAL LOW (ref 2.5–4.6)
Phosphorus: 1.4 mg/dL — ABNORMAL LOW (ref 2.5–4.6)

## 2018-11-13 LAB — PREPARE FRESH FROZEN PLASMA: Unit division: 0

## 2018-11-13 LAB — ACETAMINOPHEN LEVEL: Acetaminophen (Tylenol), Serum: <10 ug/mL — ABNORMAL LOW (ref 10–30)

## 2018-11-13 LAB — BPAM PLATELET PHERESIS
Blood Product Expiration Date: 202008210755
ISSUE DATE / TIME: 202008200908
Unit Type and Rh: 6200

## 2018-11-13 LAB — MAGNESIUM
Magnesium: 1.3 mg/dL — ABNORMAL LOW (ref 1.7–2.4)
Magnesium: 1.7 mg/dL (ref 1.7–2.4)

## 2018-11-13 LAB — AMMONIA: Ammonia: 68 umol/L — ABNORMAL HIGH (ref 9–35)

## 2018-11-13 LAB — ALBUMIN, PLEURAL OR PERITONEAL FLUID: Albumin, Fluid: 1.4 g/dL

## 2018-11-13 LAB — POTASSIUM: Potassium: 3.7 mmol/L (ref 3.5–5.1)

## 2018-11-13 LAB — GLUCOSE, CAPILLARY: Glucose-Capillary: 98 mg/dL (ref 70–99)

## 2018-11-13 MED ORDER — SODIUM CHLORIDE 0.9 % IV SOLN
1.0000 g | Freq: Every day | INTRAVENOUS | Status: DC
  Administered 2018-11-14 – 2018-11-23 (×10): 1 g via INTRAVENOUS
  Filled 2018-11-13 (×2): qty 1
  Filled 2018-11-13: qty 10
  Filled 2018-11-13 (×7): qty 1

## 2018-11-13 MED ORDER — HYDROXYZINE HCL 50 MG/ML IM SOLN
25.0000 mg | Freq: Once | INTRAMUSCULAR | Status: AC
  Administered 2018-11-13: 25 mg via INTRAMUSCULAR
  Filled 2018-11-13: qty 0.5

## 2018-11-13 MED ORDER — SODIUM CHLORIDE 0.9% FLUSH: 10.0000 mL | INTRAVENOUS | Status: DC | PRN

## 2018-11-13 MED ORDER — VITAMIN B-1 100 MG PO TABS
100.0000 mg | ORAL_TABLET | Freq: Every day | ORAL | Status: DC
  Administered 2018-11-14 – 2018-11-23 (×10): 100 mg via ORAL
  Filled 2018-11-13 (×10): qty 1

## 2018-11-13 MED ORDER — THIAMINE HCL 100 MG/ML IJ SOLN: 500.0000 mg | Freq: Every day | INTRAVENOUS | Status: DC

## 2018-11-13 MED ORDER — VITAMIN K1 10 MG/ML IJ SOLN: 10.0000 mg | Freq: Every day | INTRAVENOUS | Status: DC

## 2018-11-13 MED ORDER — IOHEXOL 350 MG/ML SOLN
100.0000 mL | Freq: Once | INTRAVENOUS | Status: AC | PRN
  Administered 2018-11-13: 100 mL via INTRAVENOUS

## 2018-11-13 MED ORDER — MAGNESIUM SULFATE 4 GM/100ML IV SOLN
4.0000 g | Freq: Once | INTRAVENOUS | Status: AC
  Administered 2018-11-13: 17:00:00 4 g via INTRAVENOUS
  Filled 2018-11-13: qty 100

## 2018-11-13 MED ORDER — FOLIC ACID 1 MG PO TABS
1.0000 mg | ORAL_TABLET | Freq: Every day | ORAL | Status: DC
  Administered 2018-11-14 – 2018-11-23 (×10): 1 mg via ORAL
  Filled 2018-11-13 (×10): qty 1

## 2018-11-13 MED ORDER — LACTULOSE 10 GM/15ML PO SOLN: 30.0000 g | Freq: Three times a day (TID) | ORAL | Status: DC

## 2018-11-13 MED ORDER — PANTOPRAZOLE SODIUM 40 MG IV SOLR: 40.0000 mg | Freq: Two times a day (BID) | INTRAVENOUS | Status: DC

## 2018-11-13 MED ORDER — ALBUMIN HUMAN 25 % IV SOLN
50.0000 g | Freq: Once | INTRAVENOUS | Status: AC
  Administered 2018-11-13: 50 g via INTRAVENOUS
  Filled 2018-11-13: qty 50

## 2018-11-13 MED ORDER — THIAMINE HCL 100 MG/ML IJ SOLN: 100.0000 mL/h | Freq: Every day | INTRAVENOUS | Status: DC

## 2018-11-13 MED ORDER — VITAMIN K1 10 MG/ML IJ SOLN
10.0000 mg | Freq: Once | INTRAVENOUS | Status: AC
  Administered 2018-11-14: 10 mg via INTRAVENOUS
  Filled 2018-11-13 (×2): qty 1

## 2018-11-13 MED ORDER — ADULT MULTIVITAMIN W/MINERALS CH
1.0000 | ORAL_TABLET | Freq: Every day | ORAL | Status: DC
  Administered 2018-11-14 – 2018-11-23 (×10): 1 via ORAL
  Filled 2018-11-13 (×10): qty 1

## 2018-11-13 MED ORDER — ONDANSETRON HCL 4 MG/2ML IJ SOLN: 4.0000 mg | Freq: Four times a day (QID) | INTRAMUSCULAR | 0 refills | Status: DC | PRN

## 2018-11-13 MED ORDER — THIAMINE HCL 100 MG/ML IJ SOLN
Freq: Once | INTRAVENOUS | Status: AC
  Filled 2018-11-13: qty 1000

## 2018-11-13 MED ORDER — LACTULOSE 10 GM/15ML PO SOLN: 30.0000 g | Freq: Three times a day (TID) | ORAL | 0 refills | Status: DC

## 2018-11-13 MED ORDER — ONDANSETRON HCL 4 MG/2ML IJ SOLN: 4.0000 mg | Freq: Four times a day (QID) | INTRAMUSCULAR | Status: DC | PRN

## 2018-11-13 MED ORDER — PROMETHAZINE HCL 25 MG/ML IJ SOLN: 12.5000 mg | Freq: Four times a day (QID) | INTRAMUSCULAR | Status: DC | PRN

## 2018-11-13 MED ORDER — POTASSIUM PHOSPHATES 15 MMOLE/5ML IV SOLN
20.0000 mmol | Freq: Once | INTRAVENOUS | Status: DC
  Administered 2018-11-13: 17:00:00 20 mmol via INTRAVENOUS
  Filled 2018-11-13: qty 6.67

## 2018-11-13 MED ORDER — LORAZEPAM 1 MG PO TABS: 1.0000 mg | ORAL_TABLET | Freq: Four times a day (QID) | ORAL | 0 refills | Status: DC | PRN

## 2018-11-13 MED ORDER — MUPIROCIN 2 % EX OINT: 1.0000 "application " | TOPICAL_OINTMENT | Freq: Two times a day (BID) | CUTANEOUS | 0 refills | Status: DC

## 2018-11-13 MED ORDER — FAMOTIDINE IN NACL 20-0.9 MG/50ML-% IV SOLN
20.0000 mg | Freq: Once | INTRAVENOUS | Status: AC
  Administered 2018-11-13: 11:00:00 20 mg via INTRAVENOUS
  Filled 2018-11-13: qty 50

## 2018-11-13 MED ORDER — LACTULOSE 10 GM/15ML PO SOLN
30.0000 g | Freq: Three times a day (TID) | ORAL | Status: DC
  Administered 2018-11-14 – 2018-11-16 (×7): 30 g via ORAL
  Filled 2018-11-13 (×8): qty 45

## 2018-11-13 MED ORDER — SODIUM CHLORIDE 0.9 % IV SOLN
1.0000 g | Freq: Every day | INTRAVENOUS | Status: DC
  Administered 2018-11-13: 1 g via INTRAVENOUS
  Filled 2018-11-13: qty 1
  Filled 2018-11-13: qty 10

## 2018-11-13 MED ORDER — SODIUM CHLORIDE 0.9% FLUSH: 10.0000 mL | Freq: Two times a day (BID) | INTRAVENOUS | Status: DC

## 2018-11-13 MED ORDER — SODIUM CHLORIDE 0.9 % IV SOLN: 50.0000 ug/h | INTRAVENOUS | Status: DC

## 2018-11-13 MED ORDER — POTASSIUM CHLORIDE 20 MEQ/15ML (10%) PO SOLN
40.0000 meq | Freq: Once | ORAL | Status: AC
  Administered 2018-11-13: 06:00:00 40 meq via ORAL
  Filled 2018-11-13: qty 30

## 2018-11-13 MED ORDER — LORAZEPAM 2 MG/ML IJ SOLN: 2.0000 mg | INTRAMUSCULAR | Status: DC | PRN

## 2018-11-13 MED ORDER — SODIUM CHLORIDE 0.9 % IV SOLN: 1.0000 g | Freq: Every day | INTRAVENOUS | Status: DC

## 2018-11-13 MED ORDER — CHLORHEXIDINE GLUCONATE CLOTH 2 % EX PADS: 6.0000 | MEDICATED_PAD | Freq: Every day | CUTANEOUS | Status: DC

## 2018-11-13 MED ORDER — SODIUM CHLORIDE 0.9 % IV SOLN
1.0000 g | INTRAVENOUS | Status: DC
  Filled 2018-11-13: qty 10

## 2018-11-13 MED ORDER — LORAZEPAM 2 MG/ML IJ SOLN: 1.0000 mg | Freq: Four times a day (QID) | INTRAMUSCULAR | 0 refills | Status: DC | PRN

## 2018-11-13 MED ORDER — SODIUM CHLORIDE 0.9 % IV SOLN
50.0000 ug/h | INTRAVENOUS | Status: DC
  Administered 2018-11-14 – 2018-11-23 (×22): 50 ug/h via INTRAVENOUS
  Filled 2018-11-13 (×26): qty 1

## 2018-11-13 MED ORDER — CIPROFLOXACIN IN D5W 400 MG/200ML IV SOLN
400.0000 mg | Freq: Two times a day (BID) | INTRAVENOUS | Status: DC
  Administered 2018-11-13: 400 mg via INTRAVENOUS
  Filled 2018-11-13 (×2): qty 200

## 2018-11-13 MED ORDER — PANTOPRAZOLE SODIUM 40 MG IV SOLR
40.0000 mg | Freq: Two times a day (BID) | INTRAVENOUS | Status: DC
  Administered 2018-11-14 – 2018-11-23 (×19): 40 mg via INTRAVENOUS
  Filled 2018-11-13 (×19): qty 40

## 2018-11-13 NOTE — Discharge Summary (Signed)
Physician Discharge Summary  Patient ID: Sitara Burhop MRN: [id number] DOB/AGE: 04-17-1965 53 y.o.  Admit date: 11/11/2018 Discharge date: 11/13/2018         DISCHARGE SUMMARY   Lisa Dennis is a 53 y.o. y/o female with a PMH of alcoholic cirrhosis, esophageal varices, hepatic encephalopathy adm 8/19 via ED with acute UGIB and possible out of hospital cardiac arrest (family administered chest compressions, weak pulse present when EMS arrived). Upon presentation required multiple units RBCs, FFP (for hepatic coagulopathy) and platelets. Intubated in ED for depressed LOC.  Detailed hospital course listed below under significant events.   MAJOR EVENTS/TEST RESULTS: 08/19 Admission as documented above 08/19 Transfused  7 units RBCs, 2 units FFP, 1 unit platelets. Intubated. Vasopressors.  08/19 US abdomen: Mild to moderate ascites 08/19 Gastroenterology consultation 08/20 EGD: Grade 1 varices in lower third of esophagus with no stigmata of recent bleed. Moderate portal hypertensive gastropathy.  No acute bleeding. 08/20 Off vasopressors. Passed SBT and extubated. + F/C. NAD on [place] O2 transitioned to stepdown status  08/21 Radiologist performed paracentesis with removal of 2L of extremely bloody fluid from LLQ.  Recommended CTA Abd/Pel mesenteric angiogram to delineate if there is active bleeding GI contacted. 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 Additionally, there are small jejunal varices at the junction of the third and fourth portion of the duodenum which also drain via the right gonadal vein. There is evidence of recent intraperitoneal bleeding with layering blood products in the patient's ascites. These very large varices may be at high risk for continued bleeding. The portal veins remain patent. Small esophageal varices consistent  with grade 1 varices as described on the recent upper endoscopy. Aortic Atherosclerosis (ICD10-170.0). 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 Hepatic cirrhosis with portal hypertension including splenomegaly and extensive  portosystemic varices as described above. Mild to moderate ascites with layering blood products consistent with recent hemorrhage. Cholelithiasis. Cardiomegaly. Small bilateral pleural effusions and associated atelectasis.  Therefore, pt required transfer to [facility] for [place]  SARS-CoV-2 8/19 >>negative MRSA PCR 8/19 >> positive Urine 8/19 >> negative  Resp 8/20 >> Blood 8/19 x2 >>negative  Peritoneal body fluid culture 08/21>>  ANTIBIOTICS Cefepime 08/19>>08/19 Metronidazole 08/19>>08/19 Vancomycin 08/19>>08/19 Ciprofloxacin 08/21>>discontinued 08/21 pt developed severe itching while infusing  Ceftriaxone 08/21>>  CONSULTS Gastroenterology  Radiology   TUBES / LINES ETT 08/19>>08/20  Discharge Exam: General: well developed female resting in bed, in NAD. Neuro: Alert and oriented, PERRLA HEENT: /AT. PERRL, sclera icterus  Cardiovascular: NSR, RRR, no RR/G  Lungs: Respirations even and unlabored.  CTA bilaterally, No W/R/R.  Abdomen: BS x 4, soft, distended, taut, non tender Musculoskeletal: No gross deformities, no edema.  Skin: jaundiced, no lesions    Vitals:   11/13/18 0900 11/13/18 0929 11/13/18 1456 11/13/18 1513  BP: 120/65  130/72 136/73  Pulse: 81  80 80  Resp: 12     Temp: (!) 101.1 F (38.4 C) 98.8 F (37.1 C)    TempSrc:  Oral    SpO2:   98% 98%  Weight:      Height:         Discharge Labs  BMET Recent Labs  Lab 11/11/18 0226 11/11/18 0650 11/12/18 0526 11/13/18 0316  NA 141 135 139 139  K 3.2* 4.3 4.0 3.2*  CL 114*  108 112* 108  CO2 19* 16* 22 25  GLUCOSE 130* 299* 131* 120*  BUN 6 8 11 13   CREATININE 0.50 0.79 0.54 0.41*  CALCIUM 7.0* 6.6* 7.0* 7.7*  MG  --   --   --   1.3*  PHOS  --   --   --  1.9*    CBC Recent Labs  Lab 11/12/18 1659 11/12/18 1853 11/13/18 0316  HGB 10.8* 10.1* 10.1*  HCT 32.6* 29.8* 30.4*  WBC 9.1 8.5 8.3  PLT 28* 28* 75*    Anti-Coagulation Recent Labs  Lab 11/11/18 0226 11/12/18 0526 11/13/18 0316  INR 2.5* 1.7* 1.6*   ASSESSMENT/PLAN   VDRF - intubated for altered MS             Passed SBT Hemorrhagic shock due to UGIB Mild AKI, resolved Mild metabolic acidosis, resolved Alcoholic cirrhosis with portal hypertension and portal gastropathy Acute blood loss anemia Thrombocytopenia Coagulopathy due to liver failure Acute encephalopathy, appears to be resolved  Supplemental oxygen as needed to maintain SPO2 >90% Mobilize out of bed Monitor BMET intermittently Monitor I/Os Correct electrolytes as indicated Advance diet as able Cont pantoprazole  Transfuse RBCs for Hgb < 7.0 or acute bleeding with hemodynamic instability DVT px: SCDs Monitor CBC intermittently Monitor temp, WBC count Micro and abx as above  Due to abnormal CT/Abd results requiring transfer to [facility] for IR Intervention    Allergies as of 11/13/2018      Reactions   Benadryl [diphenhydramine] Other (See Comments)   Reaction: makes crazy and can't sleep for days   Ciprofloxacin Itching   Latex Hives   Morphine And Related Hives   Penicillins Hives, Other (See Comments)   Did it involve swelling of the face/tongue/throat, SOB, or low BP? No Did it involve sudden or severe rash/hives, skin peeling, or any reaction on the inside of your mouth or nose? Unknown Did you need to seek medical attention at a hospital or doctor's office? Unknown When did it last happen?unknown If all above answers are "NO", may proceed with cephalosporin use.  **patient tolerates ceftriaxone - Aug 2020   Sulfa Antibiotics Hives   Tetracyclines & Related Hives      Medication List    STOP taking these medications   folic acid 1 MG  tablet Commonly known as: FOLVITE   pantoprazole 40 MG tablet Commonly known as: Protonix Replaced by: pantoprazole 40 MG injection   thiamine 100 MG tablet     TAKE these medications   cefTRIAXone 1 g in sodium chloride 0.9 % 100 mL Inject 1 g into the vein daily. Start taking on: November 14, 2018   lactulose 10 GM/15ML solution Commonly known as: CHRONULAC Take 45 mLs (30 g total) by mouth 3 (three) times daily. What changed:   how much to take  how to take this  when to take this  additional instructions   LORazepam 1 MG tablet Commonly known as: ATIVAN Take 1 tablet (1 mg total) by mouth every 6 (six) hours as needed (CIWA-AR > 8  -OR-  withdrawal symptoms:  anxiety, agitation, insomnia, diaphoresis, nausea, vomiting, tremors, tachycardia, or hypertension.).   LORazepam 2 MG/ML injection Commonly known as: ATIVAN Inject 0.5 mLs (1 mg total) into the vein every 6 (six) hours as needed (CIWA-AR > 8-OR-withdrawal symptoms:anxiety, agitation, insomnia, diaphoresis, nausea, vomiting, tremors tachycardia, or hypertension.).   mupirocin ointment 2 % Commonly known as: BACTROBAN Place 1 application into the nose 2 (two) times  daily.   octreotide 500 mcg in sodium chloride 0.9 % 250 mL Inject 50 mcg/hr into the vein continuous.   ondansetron 4 MG/2ML Soln injection Commonly known as: ZOFRAN Inject 2 mLs (4 mg total) into the vein every 6 (six) hours as needed for nausea.   pantoprazole 40 MG injection Commonly known as: PROTONIX Inject 40 mg into the vein every 12 (twelve) hours. Replaces: pantoprazole 40 MG tablet   phytonadione 10 mg in dextrose 5 % 50 mL Inject 10 mg into the vein daily. Start taking on: November 14, 2018   sodium chloride 0.9 % SOLN 50 mL with thiamine 100 MG/ML SOLN 500 mg Inject 500 mg into the vein daily. Start taking on: November 14, 2018   sodium chloride flush 0.9 % Soln Commonly known as: NS 10-40 mLs by Intracatheter route every  12 (twelve) hours.   sodium chloride flush 0.9 % Soln Commonly known as: NS 10-40 mLs by Intracatheter route as needed (flush).   thiamine [id number] mg, folic acid 1 mg, multivitamins adult 10 mL in sodium chloride 0.9 % 1,000 mL Inject 100 mL/hr into the vein daily.       Disposition: [place] Pager [phone] (please enter 7 digits) PCCM Consult Pager [phone] (please enter 7 digits)

## 2018-11-13 NOTE — Progress Notes (Signed)
   11/13/18 1800  Clinical Encounter Type  Visited With Patient;Family  Visit Type Initial  Referral From Nurse  Spiritual Encounters  Spiritual Needs Other (Comment) (Advance Directives)  Ch interacted with the mother of the pt. Mother desires the daughter to make her 71. When I interacted with the pt separately, the pt wasn't in the mood to complete the document and wished for a later chaplain visit. Next on call chaplain will follow up with the pt.

## 2018-11-13 NOTE — Progress Notes (Signed)
Received a call from radiology to inform me that the paracentesis was extremely bloody.  I did discuss the fact that the patient presented with a very low hemoglobin and there was no blood or any lesion seen in the stomach to explain the drop in hemoglobin.  I suspect the patient has had an intra-abdominal bleed either from a lesion in the liver such as [place] or from an abdominal varix that has bled.  It was suggested that a CT scan of the abdomen mesenteric angiogram protocol would be better to help delineate if there are any active bleeding going on.  I discussed this with Dr. Stark Jock who will help arrange for the same  Dr Jonathon Bellows MD,MRCP [facility]) Gastroenterology/Hepatology Pager: [phone]

## 2018-11-13 NOTE — Progress Notes (Addendum)
Pharmacy Electrolyte Monitoring Consult:  Pharmacy consulted to assist in monitoring and replacing electrolytes in this 53 y.o. female admitted on 11/11/2018 with GI Bleeding   Labs:  Sodium (mmol/L)  Date Value  11/13/2018 139   Potassium (mmol/L)  Date Value  11/13/2018 3.2 (L)   Magnesium (mg/dL)  Date Value  11/13/2018 1.3 (L)   Phosphorus (mg/dL)  Date Value  11/13/2018 1.9 (L)   Calcium (mg/dL)  Date Value  11/13/2018 7.7 (L)   Albumin (g/dL)  Date Value  11/13/2018 2.8 (L)  09/07/2018 3.8   Corrected Calcium: 8.7  Assessment/Plan: Patient ordered rifaximin and lactulose. Lactulose increased to  30g PO TID.   Patient received potassium 50mEq PO x 1. Will order magnesium 4g IV x 1 and potassium phosphate 15mmol IV x 1.   Will replace for goal potassium ~ 4, goal magnesium ~ 2, and goal phosphorus ~ 2.5.   Will obtain electrolytes with am labs.   Pharmacy will continue to monitor and adjust per consult.   Simpson,Michael L 11/13/2018 4:09 PM

## 2018-11-13 NOTE — Progress Notes (Signed)
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  Transferred here for emergent TIPS.  History of present illness   Patient is a 53 year old with history of alcohol abuse admitted to [facility] 11/11/2018 massive volume resuscitation initially including 8 units packed RBCs 4 units fresh frozen plasma 2 units platelets and intubated at time of admission due to loss of consciousness.  On EGD performed 11/12/2018 there was no evidence of an upper GI bleed.  Patient did have grade 1 esophageal varices but these were not actively bleeding.  Initial INR was 2.5 total bili of 3.3.  Ultrasound-guided paracentesis by radiology revealed 200 mL of bloody fluid obtained from the left lower quadrant.  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  There was evidence of recent intraperitoneal bleeding with layering blood in the patient's ascites.  Patient is transferred to [facility] for emergent TIPS with retrograde balloon occlusion of the varices. My evaluation the patient looks remarkably good.  She is awake alert oriented.  Vital signs are stable.  She is on octreotide.  Is icteric.  Past Medical History   . Alcoholic intoxication with complication ([employer]) 123XX123  . Hepatic encephalopathy ([place]) 08/27/2018  . GI bleeding 06/08/2018  . Alcoholic cirrhosis of liver without ascites ([place])      Significant Hospital Events   Transferred to [facility] for emergent  TIPS  Consults:  Gastroenterology radiology and interventional radiology  Procedures:  EGD 8/20 Paracentesis 8/21  Significant Diagnostic Tests:  CT angiography  Micro Data:  NA  Antimicrobials:  NA  Interim history/subjective:  As above  Objective   There were no vitals taken for this visit.       No intake or output data in the 24 hours ending 11/13/18 2111 There were no vitals filed for this visit.  Examination: General: White female in no distress HENT: Icteric sclera Lungs: Clear distant Cardiovascular: Agonal rate rhythm no murmurs or gallops Abdomen: Soft nontender bowel sounds hypoactive Extremities: Within normal limits no edema Neuro: Awake alert nonfocal GU: N/A  Resolved Hospital Problem list   NA  Assessment & Plan:  1.  Status post resuscitation for hemorrhagic stroke due to intraperitoneal bleed: Await results of TIPS monitor hemoglobin and platelet count electrolytes  2.  Status post massive volume replacement with blood products: As above  3.  Mild nonoliguric acute kidney injury: Monitor ensure adequate volume replacement, monitor I's and O's  4.  Acute encephalopathy requiring ventilator management: As per protocol.  When stable Daily SBT  5.  History of alcohol abuse: Monitor for withdrawal symptoms once more awake  6.  Altered mental status: Reevaluate daily, minimize sedation as possible  Best practice:  Diet: N.p.o. Pain/Anxiety/Delirium protocol (if indicated): Continue with sedation as indicated VAP protocol (if indicated): Yes DVT prophylaxis: No high risk for bleeding GI prophylaxis: Yes Glucose control: Yes Mobility: Bedrest Code Status: Full Family Communication: Pending Disposition: Transfer to [facility], ICU  Labs   CBC: Recent Labs  Lab 11/11/18 0226  11/12/18 0526 11/12/18 1659 11/12/18 1853 11/13/18 0316 11/13/18 1810  WBC 5.5   < > 12.6* 9.1 8.5 8.3 7.2  NEUTROABS 2.4  --   --   --  7.2  --  5.2  HGB  3.8*   < > 10.7* 10.8* 10.1* 10.1* 10.1*  HCT 13.0*   < > 31.3* 32.6* 29.8* 30.4* 31.5*  MCV 102.4*   < > 88.7 90.1 90.0 91.6 94.6  PLT 58*   < > 36* 28* 28* 75* 59*   < > = values in this interval not displayed.    Basic Metabolic Panel: Recent Labs  Lab 11/11/18 0226 11/11/18 0650 11/12/18 0526 11/13/18 0316 11/13/18 1810  NA 141 135 139 139  --   K 3.2* 4.3 4.0 3.2* 3.7  CL 114* 108 112* 108  --   CO2 19* 16* 22 25  --   GLUCOSE 130* 299* 131* 120*  --   BUN 6 8 11 13   --   CREATININE 0.50 0.79 0.54 0.41*  --   CALCIUM 7.0* 6.6* 7.0* 7.7*  --   MG  --   --   --  1.3* 1.7  PHOS  --   --   --  1.9* 1.4*   GFR: Estimated Creatinine Clearance: 76.7 mL/min (A) (by C-G formula based on SCr of 0.41 mg/dL (L)). Recent Labs  Lab 11/11/18 0320 11/11/18 0650  11/12/18 0526 11/12/18 1659 11/12/18 1853 11/13/18 0316 11/13/18 1810  PROCALCITON  --  <0.10  --  0.18  --   --   --   --   WBC  --   --    < > 12.6* 9.1 8.5 8.3 7.2  LATICACIDVEN 7.1* 5.5*  --   --   --   --   --   --    < > = values in this interval not displayed.    Liver Function Tests: Recent Labs  Lab 11/11/18 0226 11/12/18 0526 11/13/18 0316  AST 73* 136* 115*  ALT 20 41 40  ALKPHOS 46 55 64  BILITOT 3.3* 4.4* 6.3*  PROT 4.2* 5.3* 6.3*  ALBUMIN 1.6* 2.4* 2.8*   No results for input(s): LIPASE, AMYLASE in the last 168 hours. Recent Labs  Lab 11/11/18 0650 11/13/18 0446  AMMONIA 68* 68*    ABG    Component Value Date/Time   PHART 7.30 (L) 11/11/2018 0845   PCO2ART 35 11/11/2018 0845   PO2ART 84 11/11/2018 0845   HCO3 17.2 (L) 11/11/2018 0845   ACIDBASEDEF 8.4 (H) 11/11/2018 0845   O2SAT 95.1 11/11/2018 0845     Coagulation Profile: Recent Labs  Lab 11/11/18 0226 11/12/18 0526 11/13/18 0316  INR 2.5* 1.7* 1.6*    Cardiac Enzymes: No results for input(s): CKTOTAL, CKMB, CKMBINDEX, TROPONINI in the last 168 hours.  HbA1C: Hgb A1c MFr Bld  Date/Time Value Ref Range Status   08/29/2018 05:39 AM 3.7 (L) 4.8 - 5.6 % Final    Comment:    (NOTE) Pre diabetes:          5.7%-6.4% Diabetes:              >6.4% Glycemic control for   <7.0% adults with diabetes     CBG: Recent Labs  Lab 11/12/18 0758 11/12/18 1138 11/12/18 1627 11/12/18 2118 11/13/18 0739  GLUCAP 124* 122* 131* 141* 98    Review of Systems:   She has no specific complaints no complaints of abdominal pain or other  Past  Medical History  She,  has a past medical history of Alcohol abuse, Anxiety, and Cirrhosis ([place]).   Surgical History    Past Surgical History:  Procedure Laterality Date  . ESOPHAGOGASTRODUODENOSCOPY (EGD) WITH PROPOFOL N/A 06/08/2018   Procedure: ESOPHAGOGASTRODUODENOSCOPY (EGD) WITH PROPOFOL;  Surgeon: Lucilla Lame, MD;  Location: [facility] ENDOSCOPY;  Service: Endoscopy;  Laterality: N/A;  . ESOPHAGOGASTRODUODENOSCOPY (EGD) WITH PROPOFOL N/A 11/12/2018   Procedure: ESOPHAGOGASTRODUODENOSCOPY (EGD) WITH PROPOFOL;  Surgeon: Jonathon Bellows, MD;  Location[facility] END[facility]COPY;  Service: Gastroenterology;  Laterality: N/A;     Social History   reports that she has quit smoking. She has never used smokeless tobacco. She reports current alcohol use.   Family History   Her family history is not on file.   Allergies Allergies  Allergen Reactions  . Benadryl [Diphenhydramine] Other (See Comments)    Reaction: makes crazy and can't sleep for days  . Ciprofloxacin Itching  . Latex Hives  . Morphine And Related Hives  . Penicillins Hives and Other (See Comments)    Did it involve swelling of the face/tongue/throat, SOB, or low BP? No Did it involve sudden or severe rash/hives, skin peeling, or any reaction on the inside of your mouth or nose? Unknown Did you need to seek medical attention at a hospital or doctor's office? Unknown When did it last happen?unknown If all above answers are "NO", may proceed with cephalosporin use.  **patient tolerates ceftriaxone - Aug 2020   .  Sulfa Antibiotics Hives  . Tetracyclines & Related Hives     Home Medications  Prior to Admission medications   Medication Sig Start Date End Date Taking? Authorizing Provider  folic acid (FOLVITE) 1 MG tablet Take 1 tablet (1 mg total) by mouth daily. 10/19/18  Yes Henreitta Leber, MD  lactulose (CHRONULAC) 10 GM/15ML solution 20gm oral twice a day (goal is 3 bowel movements a day, can go to three or four times a day dosing if needed) 10/19/18  Yes Sainani, Belia Heman, MD  pantoprazole (PROTONIX) 40 MG tablet Take 1 tablet (40 mg total) by mouth daily. 10/19/18  Yes Sainani, Belia Heman, MD  thiamine 100 MG tablet Take 1 tablet (100 mg total) by mouth daily. 10/19/18  Yes Sainani, Belia Heman, MD  cefTRIAXone 1 g in sodium chloride 0.9 % 100 mL Inject 1 g into the vein daily. 11/14/18   Awilda Bill, NP  lactulose (CHRONULAC) 10 GM/15ML solution Take 45 mLs (30 g total) by mouth 3 (three) times daily. 11/13/18   Awilda Bill, NP  LORazepam (ATIVAN) 1 MG tablet Take 1 tablet (1 mg total) by mouth every 6 (six) hours as needed (CIWA-AR > 8  -OR-  withdrawal symptoms:  anxiety, agitation, insomnia, diaphoresis, nausea, vomiting, tremors, tachycardia, or hypertension.). 11/13/18   Awilda Bill, NP  LORazepam (ATIVAN) 2 MG/ML injection Inject 0.5 mLs (1 mg total) into the vein every 6 (six) hours as needed (CIWA-AR > 8-OR-withdrawal symptoms:anxiety, agitation, insomnia, diaphoresis, nausea, vomiting, tremors tachycardia, or hypertension.). 11/13/18   Awilda Bill, NP  mupirocin ointment (BACTROBAN) 2 % Place 1 application into the nose 2 (two) times daily. 11/13/18   Awilda Bill, NP  octreotide 500 mcg in sodium chloride 0.9 % 250 mL Inject 50 mcg/hr into the vein continuous. 11/13/18   Awilda Bill, NP  ondansetron (ZOFRAN) 4 MG/2ML SOLN injection Inject 2 mLs (4 mg total) into the vein every 6 (six) hours as needed for nausea. 11/13/18   Blakeney,  Dreama Saa, NP  pantoprazole (PROTONIX) 40 MG  injection Inject 40 mg into the vein every 12 (twelve) hours. 11/13/18   Awilda Bill, NP  phytonadione 10 mg in dextrose 5 % 50 mL Inject 10 mg into the vein daily. 11/14/18   Awilda Bill, NP  sodium chloride 0.9 % SOLN 50 mL with thiamine 100 MG/ML SOLN 500 mg Inject 500 mg into the vein daily. 11/14/18   Awilda Bill, NP  sodium chloride flush (NS) 0.9 % SOLN 10-40 mLs by Intracatheter route every 12 (twelve) hours. 11/13/18   Awilda Bill, NP  sodium chloride flush (NS) 0.9 % SOLN 10-40 mLs by Intracatheter route as needed (flush). 11/13/18   Awilda Bill, NP  thiamine [id number] mg, folic acid 1 mg, multivitamins adult 10 mL in sodium chloride 0.9 % 1,000 mL Inject 100 mL/hr into the vein daily. 11/13/18   Awilda Bill, NP     Critical care time: 35 minutes spent in bedside evaluation chart review and critical care planning.

## 2018-11-13 NOTE — Progress Notes (Signed)
Pharmacy Antibiotic Note  Lisa Dennis is a 53 y.o. female admitted to [facility] on 8/19 with upper GI bleed secondary to liver cirrhosis. Transferred to [facility] due to hemorrhagic shock from intraperitoneal bleed and need for emergent TIPS. Patient history significant for EtOH abuse, portal hypertension, ascites, and esophageal varices. Previous tolerance of cephalosporins despite penicillin allergy. Pharmacy has been consulted for ceftriaxone dosing for SBP prophylaxis. Last ceftriaxone dose was today at 12:46. Tmax 102, WBC normal.   Plan: Ceftriaxone 1 g IV q24h Monitor clinical progress and cultures/sensitivities      Temp (24hrs), Avg:100.5 F (38.1 C), Min:98.8 F (37.1 C), Max:102 F (38.9 C)  Recent Labs  Lab 11/11/18 0226 11/11/18 0320 11/11/18 0650  11/12/18 0526 11/12/18 1659 11/12/18 1853 11/13/18 0316 11/13/18 1810  WBC 5.5  --   --    < > 12.6* 9.1 8.5 8.3 7.2  CREATININE 0.50  --  0.79  --  0.54  --   --  0.41*  --   LATICACIDVEN  --  7.1* 5.5*  --   --   --   --   --   --    < > = values in this interval not displayed.    Estimated Creatinine Clearance: 76.7 mL/min (A) (by C-G formula based on SCr of 0.41 mg/dL (L)).    Allergies  Allergen Reactions  . Benadryl [Diphenhydramine] Other (See Comments)    Reaction: makes crazy and can't sleep for days  . Ciprofloxacin Itching  . Latex Hives  . Morphine And Related Hives  . Penicillins Hives and Other (See Comments)    Did it involve swelling of the face/tongue/throat, SOB, or low BP? No Did it involve sudden or severe rash/hives, skin peeling, or any reaction on the inside of your mouth or nose? Unknown Did you need to seek medical attention at a hospital or doctor's office? Unknown When did it last happen?unknown If all above answers are "NO", may proceed with cephalosporin use.  **patient tolerates ceftriaxone - Aug 2020   . Sulfa Antibiotics Hives  . Tetracyclines & Related Hives    Antimicrobials this admission: vancomycin 08/19 x 1 cefepime 08/19 x 1 metronidazole 08/1p x1 ciprofloxacin 08/21 x 1 (discontinued due to itching) ceftriaxone 08/19 x 2; 08/21 >>  Dose adjustments this admission: n/a  Microbiology results: 8/19 BCx: pending; no growth x 2 days 8/19 UCx:  No growth 8/19 MRSA PCR: positive 8/20 Resp cx: few Staph aureus  Thank you for involving pharmacy in this patient's care.  Renold Genta, PharmD, BCPS Clinical Pharmacist Clinical phone for 11/13/2018 until 21:30p is [phone] 11/13/2018 9:27 PM  **Pharmacist phone directory can be found on [email or web address] listed under [place]**

## 2018-11-13 NOTE — Progress Notes (Signed)
[place] [facility] [place] NAME: Marline Inscoe    MR#:  [id number]  DATE OF BIRTH:  [birth year]  SUBJECTIVE:  CHIEF COMPLAINT:   Chief Complaint  Patient presents with  . GI Bleeding   No new complaint this morning.  Patient resting comfortably.  Noted low-grade fever with temperature of 100.9 overnight.  Initiated empiric antibiotics for SBP with Rocephin . Patient was extubated yesterday.  Doing well  REVIEW OF SYSTEMS:  Review of Systems  Constitutional: Negative for chills and fever.  HENT: Negative for hearing loss and tinnitus.   Eyes: Negative for blurred vision and double vision.  Respiratory: Negative for cough and hemoptysis.   Cardiovascular: Negative for chest pain and palpitations.  Gastrointestinal: Positive for nausea. Negative for heartburn and vomiting.  Genitourinary: Negative for dysuria and urgency.  Musculoskeletal: Negative for myalgias and neck pain.  Skin: Negative for itching and rash.  Neurological: Negative for dizziness and headaches.  Psychiatric/Behavioral: Negative for depression and hallucinations.    DRUG ALLERGIES:   Allergies  Allergen Reactions  . Benadryl [Diphenhydramine] Other (See Comments)    Reaction: makes crazy and can't sleep for days  . Ciprofloxacin Itching  . Latex Hives  . Morphine And Related Hives  . Penicillins Hives and Other (See Comments)    Did it involve swelling of the face/tongue/throat, SOB, or low BP? No Did it involve sudden or severe rash/hives, skin peeling, or any reaction on the inside of your mouth or nose? Unknown Did you need to seek medical attention at a hospital or doctor's office? Unknown When did it last happen?unknown If all above answers are "NO", may proceed with cephalosporin use.  **patient tolerates ceftriaxone - Aug 2020   . Sulfa Antibiotics Hives  . Tetracyclines & Related Hives   VITALS:  Blood pressure 136/73, pulse 80, temperature  98.8 F (37.1 C), temperature source Oral, resp. rate 12, height 5' 4.02" (1.626 m), weight 65.8 kg, SpO2 98 %. PHYSICAL EXAMINATION:   Physical Exam  Constitutional: She is oriented to person, place, and time. She appears well-developed and well-nourished.  HENT:  Head: Normocephalic and atraumatic.  Right Ear: External ear normal.  Eyes: Pupils are equal, round, and reactive to light. Conjunctivae are normal.  Neck: No thyromegaly present.  Cardiovascular: Normal rate, regular rhythm and normal heart sounds.  Respiratory: Effort normal and breath sounds normal. No respiratory distress.  GI: Soft. Bowel sounds are normal. She exhibits no distension.  Musculoskeletal: Normal range of motion.        General: No edema.  Neurological: She is alert and oriented to person, place, and time.  Skin: Skin is warm. She is not diaphoretic. No erythema.  Psychiatric: She has a normal mood and affect.   LABORATORY PANEL:  Female CBC Recent Labs  Lab 11/13/18 0316  WBC 8.3  HGB 10.1*  HCT 30.4*  PLT 75*   ------------------------------------------------------------------------------------------------------------------ Chemistries  Recent Labs  Lab 11/13/18 0316  NA 139  K 3.2*  CL 108  CO2 25  GLUCOSE 120*  BUN 13  CREATININE 0.41*  CALCIUM 7.7*  MG 1.3*  AST 115*  ALT 40  ALKPHOS 64  BILITOT 6.3*   RADIOLOGY:  No results found. ASSESSMENT AND PLAN:   1.  GI bleeding likely of upper GI etiology with coagulopathy likely secondary to her liver cirrhosis with subsequent acute blood loss anemia and hypovolemic shock. -The patient was admitted to an ICU bed. --Patient was extubated  on 11/12/2018  to oxygen via nasal cannula.  Awake and alert this morning. Seen by gastroenterologist .  Plan is to continue IV PPI and octreotide.  IV antibiotics for SBP prophylaxis in the setting of GI bleed initiated this morning.  Started on Rocephin since patient allergic to penicillins.  On  vitamin K 10 mg subcu daily x3 days. Patient needs to stop drinking any alcohol going forward. IV thiamine to prevent Wernicke's encephalopathy. Doppler ultrasound of the liver to rule out portal vein thrombosis.   Patient had paracentesis done this morning.  Was called by gastroenterologist Dr. Vicente Males who was notified by radiologist that the paracentesis was extremely bloody.  Suspected intra-abdominal bleed from either a lesion in the liver such as [place] or from abdominal varix.  Recommendation was to do CT scan of the abdomen with mesenteric angiogram protocol to further delineate if there is any active bleeding going on.  I have notified the intensivist Dr. Patsey Berthold who stated she will put the order in. Hemoglobin has improved from 3.8-10.1 following at least 8 units of packed red blood cells.  Patient was also given FFP and platelets. Patient being managed in the ICU by intensivist and gastroenterologist  2.  Lactic acidosis, hypokalemia and elevated AST with history of alcoholic cirrhosis. -Given her shock and the possibility of associated sepsis she was started on IV Rocephin .vancomycin appears to have been previously discontinued.  -Her COVID 19 test came back negative.  3.  Alcohol abuse. Patient is to stop drinking. IV thiamine to prevent Wernicke's encephalopathy    DVT prophylaxis. -SCDs.  All the records are reviewed and case discussed with Care Management/Social Worker. Management plans discussed with the patient, family and they are in agreement.  CODE STATUS: Full Code  TOTAL TIME TAKING CARE OF THIS PATIENT: 38 minutes.   More than 50% of the time was spent in counseling/coordination of care: YES  POSSIBLE D/C IN 2 DAYS, DEPENDING ON CLINICAL CONDITION.   Keonte Daubenspeck M.D on 11/13/2018 at 3:45 PM  Between 7am to 6pm - Pager - [phone]  After 6pm go to [email or web address] - Proofreader  [facility]  Office  [phone]  CC: Primary  care physician; Rusty Aus, MD  Note: This dictation was prepared with Dragon dictation along with smaller phrase technology. Any transcriptional errors that result from this process are unintentional.

## 2018-11-13 NOTE — Progress Notes (Signed)
Lisa Dennis , MD [address], [place], [place], [place], [address] [address], [place], [place], [place], [address] Phone: [phone]  Fax: [phone]   Lisa Dennis is being followed for GI bleed Day 1 of follow up   Subjective: Doing well , she is awake and alert , feels well    Objective: Vital signs in last 24 hours: Vitals:   11/13/18 0700 11/13/18 0800 11/13/18 0900 11/13/18 0929  BP: 133/69 123/64 120/65   Pulse: 78 75 81   Resp: '14 12 12   ' Temp: (!) 101.1 F (38.4 C) (!) 101.1 F (38.4 C) (!) 101.1 F (38.4 C) 98.8 F (37.1 C)  TempSrc:    Oral  SpO2:      Weight:      Height:       Weight change:   Intake/Output Summary (Last 24 hours) at 11/13/2018 1236 Last data filed at 11/13/2018 0600 Gross per 24 hour  Intake 548.89 ml  Output 680 ml  Net -131.11 ml     Exam: Heart:: Regular rate and rhythm, S1S2 present or without murmur or extra heart sounds Lungs: normal, clear to auscultation and clear to auscultation and percussion Abdomen: distended , soft , non tender , BS+   Lab Results: '@LABTEST2' @ Micro Results: Recent Results (from the past 240 hour(s))  SARS Coronavirus 2 [facility] order, Performed in [facility] hospital lab) Nasopharyngeal Nasopharyngeal Swab     Status: None   Collection Time: 11/11/18  2:26 AM   Specimen: Nasopharyngeal Swab  Result Value Ref Range Status   SARS Coronavirus 2 NEGATIVE NEGATIVE Final    Comment: (NOTE) If result is NEGATIVE SARS-CoV-2 target nucleic acids are NOT DETECTED. The SARS-CoV-2 RNA is generally detectable in upper and lower  respiratory specimens during the acute phase of infection. The lowest  concentration of SARS-CoV-2 viral copies this assay can detect is 250  copies / mL. A negative result does not preclude SARS-CoV-2 infection  and should not be used as the sole basis for treatment or other  patient management decisions.  A negative result may occur with  improper  specimen collection / handling, submission of specimen other  than nasopharyngeal swab, presence of viral mutation(s) within the  areas targeted by this assay, and inadequate number of viral copies  (<250 copies / mL). A negative result must be combined with clinical  observations, patient history, and epidemiological information. If result is POSITIVE SARS-CoV-2 target nucleic acids are DETECTED. The SARS-CoV-2 RNA is generally detectable in upper and lower  respiratory specimens dur ing the acute phase of infection.  Positive  results are indicative of active infection with SARS-CoV-2.  Clinical  correlation with patient history and other diagnostic information is  necessary to determine patient infection status.  Positive results do  not rule out bacterial infection or co-infection with other viruses. If result is PRESUMPTIVE POSTIVE SARS-CoV-2 nucleic acids MAY BE PRESENT.   A presumptive positive result was obtained on the submitted specimen  and confirmed on repeat testing.  While 2019 novel coronavirus  (SARS-CoV-2) nucleic acids may be present in the submitted sample  additional confirmatory testing may be necessary for epidemiological  and / or clinical management purposes  to differentiate between  SARS-CoV-2 and other Sarbecovirus currently known to infect humans.  If clinically indicated additional testing with an alternate test  methodology [phone]) is advised. The SARS-CoV-2 RNA is generally  detectable in upper and lower respiratory sp ecimens during the acute  phase of infection. The expected result is Negative. Fact Sheet for Patients:  StrictlyIdeas.no Fact Sheet for Healthcare Providers: BankingDealers.co.za This test is not yet approved or cleared by the [place] FDA and has been authorized for detection and/or diagnosis of SARS-CoV-2 by FDA under an Emergency Use Authorization (EUA).  This EUA will remain in  effect (meaning this test can be used) for the duration of the COVID-19 declaration under Section 564(b)(1) of the Act, 21 U.S.C. section 360bbb-3(b)(1), unless the authorization is terminated or revoked sooner. Performed at [facility], [address] [place], [place] [address]   Blood culture (routine x 2)     Status: None (Preliminary result)   Collection Time: 11/11/18  2:26 AM   Specimen: BLOOD  Result Value Ref Range Status   Specimen Description BLOOD ARTHROGRAPHIS SPECIES  Final   Special Requests   Final    BOTTLES DRAWN AEROBIC AND ANAEROBIC Blood Culture results may not be optimal due to an excessive volume of blood received in culture bottles   Culture   Final    NO GROWTH 2 DAYS Performed at [facility], [address] [place], [place] [address]    Report Status PENDING  Incomplete  Urine Culture     Status: None   Collection Time: 11/11/18  5:35 AM   Specimen: Urine, Random  Result Value Ref Range Status   Specimen Description   Final    URINE, RANDOM Performed at [facility], [address] [place], [place] [address]    Special Requests   Final    Normal Performed at [facility], [address] [place], [place] [address]    Culture   Final    NO GROWTH Performed at [facility], [place] [address] [place], [place] [address]    Report Status 11/12/2018 FINAL  Final  MRSA PCR Screening     Status: Abnormal   Collection Time: 11/11/18  6:02 AM   Specimen: Nasal Mucosa; Nasopharyngeal  Result Value Ref Range Status   MRSA by PCR POSITIVE (A) NEGATIVE Final    Comment:        The GeneXpert MRSA Assay (FDA approved for NASAL specimens only), is one component of a comprehensive MRSA colonization surveillance program. It is not intended to diagnose MRSA infection nor to guide or monitor treatment for MRSA infections. RESULT CALLED TO, READ BACK BY AND VERIFIED WITH: Casey Burkitt AT 0998 ON 11/11/2018 JJB  Performed at [facility], [address] [place], [place] [address]   Blood culture (routine x 2)     Status: None (Preliminary result)   Collection Time: 11/11/18  6:51 AM   Specimen: BLOOD  Result Value Ref Range Status   Specimen Description BLOOD LINE  Final   Special Requests   Final    BOTTLES DRAWN AEROBIC AND ANAEROBIC Blood Culture results may not be optimal due to an excessive volume of blood received in culture bottles   Culture   Final    NO GROWTH 2 DAYS Performed at [facility], [address], [place], [place] [address]    Report Status PENDING  Incomplete  Culture, respiratory     Status: None (Preliminary result)   Collection Time: 11/12/18 10:38 AM   Specimen: Tracheal Aspirate; Respiratory  Result Value Ref Range Status   Specimen Description   Final    TRACHEAL ASPIRATE Performed at [facility], [address], [place], [place] [address]  Special Requests   Final    NONE Performed at [facility], [place], [place] [address]    Gram Stain   Final    MODERATE WBC PRESENT,BOTH PMN AND MONONUCLEAR NO ORGANISMS SEEN    Culture   Final    FEW STAPHYLOCOCCUS AUREUS SUSCEPTIBILITIES TO FOLLOW Performed at [facility], [place] [address], [place], [place] [address]    Report Status PENDING  Incomplete   Studies/Results: Dg Chest Port 1 View  Result Date: 11/12/2018 CLINICAL DATA:  Respiratory failure EXAM: PORTABLE CHEST 1 VIEW COMPARISON:  11/11/2018 FINDINGS: Endotracheal tube and NG tube are unchanged. Low lung volumes. Mild cardiomegaly. Vascular congestion. Patchy bilateral airspace opacities have increased since prior study. IMPRESSION: Cardiomegaly, vascular congestion. Patchy bilateral areas of atelectasis or infiltrate. Low lung volumes. Electronically Signed   By: Rolm Baptise M.D.   On: 11/12/2018 10:04   Medications: I have reviewed the patient's current medications. Scheduled Meds: .  sodium chloride   Intravenous Once  . [START ON 11/14/2018] Chlorhexidine Gluconate Cloth  6 each Topical Q0600  . feeding supplement (ENSURE ENLIVE)  237 mL Oral BID BM  . lactulose  30 g Oral TID  . mupirocin ointment  1 application Nasal BID  . pantoprazole  40 mg Intravenous Q12H  . rifaximin  550 mg Oral BID  . sodium chloride flush  10-40 mL Intracatheter Q12H   Continuous Infusions: . sodium chloride    . cefTRIAXone (ROCEPHIN)  IV    . octreotide  (SANDOSTATIN)    IV infusion 50 mcg/hr (11/12/18 0742)  . phytonadione (VITAMIN K) IV 10 mg (11/13/18 1144)  . banana bag IV 1000 mL 100 mL/hr at 11/12/18 1646  . thiamine injection Stopped (11/11/18 1018)   PRN Meds:.acetaminophen **OR** acetaminophen, LORazepam **OR** LORazepam, [DISCONTINUED] ondansetron **OR** ondansetron (ZOFRAN) IV, promethazine, sodium chloride flush CBC Latest Ref Rng & Units 11/13/2018 11/12/2018 11/12/2018  WBC 4.0 - 10.5 K/uL 8.3 8.5 9.1  Hemoglobin 12.0 - 15.0 g/dL 10.1(L) 10.1(L) 10.8(L)  Hematocrit 36.0 - 46.0 % 30.4(L) 29.8(L) 32.6(L)  Platelets 150 - 400 K/uL 75(L) 28(LL) 28(LL)    Hepatic Function Latest Ref Rng & Units 11/13/2018 11/12/2018 11/11/2018  Total Protein 6.5 - 8.1 g/dL 6.3(L) 5.3(L) 4.2(L)  Albumin 3.5 - 5.0 g/dL 2.8(L) 2.4(L) 1.6(L)  AST 15 - 41 U/L 115(H) 136(H) 73(H)  ALT 0 - 44 U/L 40 41 20  Alk Phosphatase 38 - 126 U/L 64 55 46  Total Bilirubin 0.3 - 1.2 mg/dL 6.3(H) 4.4(H) 3.3(H)  Bilirubin, Direct 0.0 - 0.2 mg/dL - 2.0(H) -    Assessment: Active Problems:   GI bleeding Deatra Mcmahen is a 53 y.o. y/o female with a history of alcoholic liver cirrhosis with grade 1 esophageal varices and portal hypertensive gastropathy presents to the emergency room in hypotensive shock and bloody vomitus.  Subsequently intubated and NG tube aspirate has been negative for bleeding.  Hemoglobin was very low at 3.8 g. Surprisingly had a normal BUN/creatinine ratio.  INR 2.5 suggesting of  acute liver failure.  Transaminases are only mildly elevated with AST of 73 and total bilirubin of 3.3.   EGD showed only PHG- no bleeding . INR down to 1.6. Likely also has alcoholic hepatitis.   Plan 1.  Monitor CBC and transfuse. Hb stable.   IV antibiotics for SBP prophylaxis in the setting of GI bleed in a cirrhotic patient.  2.  Vitamin K 10 mg subcutaneous daily for  3 days to replace any malnutrition related vitamin K deficiencies contributing to elevated INR.  3.  She absolutely needs to stop drinking any alcohol.  4.  IV thiamine to prevent any Wernicke's encephalopathy.  5.  Check ultrasound Doppler of the liver to rule out portal vein thrombosis, Tylenol levels, diagnostic paracentesis to rule out SBP ,blood cultures,  patient has been febrile  6. Closer to discharge can consider a colonoscopy since no source of bleeding was seen on EGD: I do not think she is ready yet to drink a bowel prep today .      LOS: 2 days   Lisa Bellows, MD 11/13/2018, 12:36 PM

## 2018-11-13 NOTE — Progress Notes (Signed)
Pharmacy Antibiotic Note  Lisa Dennis is a 53 y.o. female admitted on 11/11/2018 with upper GI bleed secondary to liver cirrhosis. Patient history significant for EtOH abuse, portal hypertension, ascites, and esophageal varices. Patient received vancomycin on 8/19 for suspected sepsis, as well as cefepime and Flagyl for possible intra-abdominal infection. Endoscopy on 8/20 found Grade 1 esophageal varices but no stigmata of recent bleed. For SBP prophylaxis, patient was given clindamycin. Patient had an allergic reaction (itching) this AM, and medication was transitioned to ceftriaxone due to previous tolerance of cephalosporins despite penicillin allergy. Pharmacy has been consulted for ceftriaxone dosing for SBP prophylaxis.  Plan: Initiate ceftriaxone 1g QD.   Height: 5' 4.02" (162.6 cm) Weight: 145 lb (65.8 kg) IBW/kg (Calculated) : 54.74  Temp (24hrs), Avg:99.5 F (37.5 C), Min:98.2 F (36.8 C), Max:101.1 F (38.4 C)  Recent Labs  Lab 11/11/18 0226 11/11/18 0320 11/11/18 0650 11/11/18 1343 11/12/18 0526 11/12/18 1659 11/12/18 1853 11/13/18 0316  WBC 5.5  --   --  18.4* 12.6* 9.1 8.5 8.3  CREATININE 0.50  --  0.79  --  0.54  --   --  0.41*  LATICACIDVEN  --  7.1* 5.5*  --   --   --   --   --     Estimated Creatinine Clearance: 76.7 mL/min (A) (by C-G formula based on SCr of 0.41 mg/dL (L)).    Allergies  Allergen Reactions  . Benadryl [Diphenhydramine] Other (See Comments)    Reaction: makes crazy and can't sleep for days  . Ciprofloxacin Itching  . Latex Hives  . Morphine And Related Hives  . Penicillins Hives and Other (See Comments)    Did it involve swelling of the face/tongue/throat, SOB, or low BP? No Did it involve sudden or severe rash/hives, skin peeling, or any reaction on the inside of your mouth or nose? Unknown Did you need to seek medical attention at a hospital or doctor's office? Unknown When did it last happen?unknown If all above answers  are "NO", may proceed with cephalosporin use.  **patient tolerates ceftriaxone - Aug 2020   . Sulfa Antibiotics Hives  . Tetracyclines & Related Hives   Antimicrobials this admission: vancomycin 08/19 x 1 cefepime 08/19 x 1 metronidazole 08/19>> ciprofloxacin 08/21 x 1 (discontinued due to itching) ceftriaxone 08/19 x 2; 08/21 >>  Dose adjustments this admission: n/a  Microbiology results: 8/19 BCx: pending; no growth x 2 days 8/19 UCx:  No growth 8/19 MRSA PCR: positive 08/20 Resp cx: few Staph aureus, susceptibilities to fllow  Thank you for allowing pharmacy to be a part of this patient's care.  Lisa Dennis Dear Nicholes Mango 11/13/2018 10:25 AM

## 2018-11-13 NOTE — Procedures (Signed)
Interventional Radiology Procedure:   Indications: Ascites and history of low hemoglobin.  Procedure: US guided paracentesis  Findings: Removed 2000 ml of bloody fluid from LLQ quadrant  Complications: None     EBL: Less than 10 ml   Arneshia Ade R. Anselm Pancoast, MD  Pager: [phone]

## 2018-11-14 ENCOUNTER — Inpatient Hospital Stay (HOSPITAL_COMMUNITY): Payer: BLUE CROSS/BLUE SHIELD

## 2018-11-14 ENCOUNTER — Encounter (HOSPITAL_COMMUNITY): Payer: Self-pay

## 2018-11-14 ENCOUNTER — Other Ambulatory Visit: Payer: Self-pay

## 2018-11-14 DIAGNOSIS — I361 Nonrheumatic tricuspid (valve) insufficiency: Secondary | ICD-10-CM

## 2018-11-14 DIAGNOSIS — I639 Cerebral infarction, unspecified: Secondary | ICD-10-CM

## 2018-11-14 LAB — BPAM PLATELET PHERESIS
Blood Product Expiration Date: 202008232359
Blood Product Expiration Date: 202008232359
ISSUE DATE / TIME: 202008202059
ISSUE DATE / TIME: 202008202338
Unit Type and Rh: 6200
Unit Type and Rh: 8400

## 2018-11-14 LAB — CBC
HCT: 34 % — ABNORMAL LOW (ref 36.0–46.0)
Hemoglobin: 11.1 g/dL — ABNORMAL LOW (ref 12.0–15.0)
MCH: 30.5 pg (ref 26.0–34.0)
MCHC: 32.6 g/dL (ref 30.0–36.0)
MCV: 93.4 fL (ref 80.0–100.0)
Platelets: 60 10*3/uL — ABNORMAL LOW (ref 150–400)
RBC: 3.64 MIL/uL — ABNORMAL LOW (ref 3.87–5.11)
RDW: 17.8 % — ABNORMAL HIGH (ref 11.5–15.5)
WBC: 7.3 10*3/uL (ref 4.0–10.5)
nRBC: 0 % (ref 0.0–0.2)

## 2018-11-14 LAB — COMPREHENSIVE METABOLIC PANEL
ALT: 35 U/L (ref 0–44)
AST: 81 U/L — ABNORMAL HIGH (ref 15–41)
Albumin: 2.9 g/dL — ABNORMAL LOW (ref 3.5–5.0)
Alkaline Phosphatase: 68 U/L (ref 38–126)
Anion gap: 8 (ref 5–15)
BUN: 7 mg/dL (ref 6–20)
CO2: 23 mmol/L (ref 22–32)
Calcium: 8.1 mg/dL — ABNORMAL LOW (ref 8.9–10.3)
Chloride: 105 mmol/L (ref 98–111)
Creatinine, Ser: 0.49 mg/dL (ref 0.44–1.00)
GFR calc Af Amer: >60 mL/min (ref >60)
GFR calc non Af Amer: >60 mL/min (ref >60)
Glucose, Bld: 104 mg/dL — ABNORMAL HIGH (ref 70–99)
Potassium: 3.8 mmol/L (ref 3.5–5.1)
Sodium: 136 mmol/L (ref 135–145)
Total Bilirubin: 9.1 mg/dL — ABNORMAL HIGH (ref 0.3–1.2)
Total Protein: 6.7 g/dL (ref 6.5–8.1)

## 2018-11-14 LAB — ECHOCARDIOGRAM COMPLETE: Weight: 2303.37 oz

## 2018-11-14 LAB — CULTURE, RESPIRATORY W GRAM STAIN

## 2018-11-14 LAB — PHOSPHORUS: Phosphorus: 1.8 mg/dL — ABNORMAL LOW (ref 2.5–4.6)

## 2018-11-14 LAB — PREPARE PLATELET PHERESIS
Unit division: 0
Unit division: 0

## 2018-11-14 LAB — PROTIME-INR
INR: 1.8 — ABNORMAL HIGH (ref 0.8–1.2)
Prothrombin Time: 20.7 seconds — ABNORMAL HIGH (ref 11.4–15.2)

## 2018-11-14 LAB — MAGNESIUM: Magnesium: 1.7 mg/dL (ref 1.7–2.4)

## 2018-11-14 MED ORDER — CHLORHEXIDINE GLUCONATE CLOTH 2 % EX PADS
6.0000 | MEDICATED_PAD | Freq: Every day | CUTANEOUS | Status: DC
  Administered 2018-11-14 – 2018-11-21 (×8): 6 via TOPICAL

## 2018-11-14 MED ORDER — POTASSIUM PHOSPHATES 15 MMOLE/5ML IV SOLN
30.0000 mmol | Freq: Once | INTRAVENOUS | Status: AC
  Administered 2018-11-14: 30 mmol via INTRAVENOUS
  Filled 2018-11-14: qty 10

## 2018-11-14 MED ORDER — PHYTONADIONE 5 MG PO TABS
10.0000 mg | ORAL_TABLET | Freq: Once | ORAL | Status: AC
  Administered 2018-11-14: 10 mg via ORAL
  Filled 2018-11-14: qty 2

## 2018-11-14 MED ORDER — ORAL CARE MOUTH RINSE
15.0000 mL | Freq: Two times a day (BID) | OROMUCOSAL | Status: DC
  Administered 2018-11-14 – 2018-11-23 (×15): 15 mL via OROMUCOSAL

## 2018-11-14 MED ORDER — MUPIROCIN 2 % EX OINT
TOPICAL_OINTMENT | Freq: Two times a day (BID) | CUTANEOUS | Status: DC
  Administered 2018-11-14: 10:00:00 via NASAL
  Administered 2018-11-14: 1 via NASAL
  Administered 2018-11-15 – 2018-11-22 (×16): via NASAL
  Filled 2018-11-14 (×3): qty 22

## 2018-11-14 NOTE — H&P (Deleted)
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  Transferred here for emergent TIPS.  History of present illness   Patient is a 53 year old with history of alcohol abuse admitted to [facility] 11/11/2018 massive volume resuscitation initially including 8 units packed RBCs 4 units fresh frozen plasma 2 units platelets and intubated at time of admission due to loss of consciousness.  On EGD performed 11/12/2018 there was no evidence of an upper GI bleed.  Patient did have grade 1 esophageal varices but these were not actively bleeding.  Initial INR was 2.5 total bili of 3.3.  Ultrasound-guided paracentesis by radiology revealed 200 mL of bloody fluid obtained from the left lower quadrant.  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  There was evidence of recent intraperitoneal bleeding with layering blood in the patient's ascites.  Patient is transferred to [facility] for emergent TIPS with retrograde balloon occlusion of the varices. My evaluation the patient looks remarkably good.  She is awake alert oriented.  Vital signs are stable.  She is on octreotide.  Is icteric.  Past Medical History   . Alcoholic intoxication with complication ([place]) 123XX123  . Hepatic encephalopathy ([place]) 08/27/2018  . GI bleeding 06/08/2018  . Alcoholic cirrhosis of liver without ascites ([place])      Significant Hospital Events   Transferred to [facility] for emergent  TIPS  Consults:  Gastroenterology radiology and interventional radiology  Procedures:  EGD 8/20 Paracentesis 8/21  Significant Diagnostic Tests:  CT angiography  Micro Data:  NA  Antimicrobials:  NA  Interim history/subjective:  As above  Objective   Blood pressure 138/86, pulse 88, temperature 100.2 F (37.9 C), temperature source Oral, resp. rate 20, SpO2 90 %.        Intake/Output Summary (Last 24 hours) at 11/14/2018 0416 Last data filed at 11/14/2018 0300 Gross per 24 hour  Intake 657.32 ml  Output 290 ml  Net 367.32 ml   There were no vitals filed for this visit.  Examination: General: White female in no distress HENT: Icteric sclera Lungs: Clear distant Cardiovascular: Agonal rate rhythm no murmurs or gallops Abdomen: Soft nontender bowel sounds hypoactive Extremities: Within normal limits no edema Neuro: Awake alert nonfocal GU: N/A  Resolved Hospital Problem list   NA  Assessment & Plan:  1.  Status post resuscitation for hemorrhagic stroke due to intraperitoneal bleed: Await results of TIPS monitor hemoglobin and platelet count electrolytes  2.  Status post massive volume replacement with blood products: As above  3.  Mild nonoliguric acute kidney injury: Monitor ensure adequate volume replacement, monitor I's and O's  4.  Acute encephalopathy requiring ventilator management: As per protocol.  When stable Daily SBT  5.  History of alcohol abuse: Monitor for withdrawal symptoms once more awake  6.  Altered mental status: Reevaluate daily, minimize sedation as possible  Best practice:  Diet: N.p.o. Pain/Anxiety/Delirium protocol (if indicated): Continue with sedation as indicated VAP protocol (if indicated): Yes DVT prophylaxis: No high risk for bleeding  GI prophylaxis: Yes Glucose control: Yes Mobility: Bedrest Code Status: Full Family Communication: Pending Disposition: Transfer to [facility], ICU  Labs   CBC: Recent Labs  Lab  11/11/18 0226  11/12/18 1853 11/13/18 0316 11/13/18 1810 11/13/18 2210 11/14/18 0312  WBC 5.5   < > 8.5 8.3 7.2 7.4 7.3  NEUTROABS 2.4  --  7.2  --  5.2  --   --   HGB 3.8*   < > 10.1* 10.1* 10.1* 10.4* 11.1*  HCT 13.0*   < > 29.8* 30.4* 31.5* 31.7* 34.0*  MCV 102.4*   < > 90.0 91.6 94.6 93.2 93.4  PLT 58*   < > 28* 75* 59* 58* 60*   < > = values in this interval not displayed.    Basic Metabolic Panel: Recent Labs  Lab 11/11/18 0226 11/11/18 0650 11/12/18 0526 11/13/18 0316 11/13/18 1810 11/13/18 2210  NA 141 135 139 139  --  137  K 3.2* 4.3 4.0 3.2* 3.7 3.7  CL 114* 108 112* 108  --  104  CO2 19* 16* 22 25  --  23  GLUCOSE 130* 299* 131* 120*  --  134*  BUN 6 8 11 13   --  6  CREATININE 0.50 0.79 0.54 0.41*  --  0.51  CALCIUM 7.0* 6.6* 7.0* 7.7*  --  8.2*  MG  --   --   --  1.3* 1.7  --   PHOS  --   --   --  1.9* 1.4*  --    GFR: Estimated Creatinine Clearance: 76.7 mL/min (by C-G formula based on SCr of 0.51 mg/dL). Recent Labs  Lab 11/11/18 0320 11/11/18 0650  11/12/18 0526  11/13/18 0316 11/13/18 1810 11/13/18 2210 11/14/18 0312  PROCALCITON  --  <0.10  --  0.18  --   --   --   --   --   WBC  --   --    < > 12.6*   < > 8.3 7.2 7.4 7.3  LATICACIDVEN 7.1* 5.5*  --   --   --   --   --   --   --    < > = values in this interval not displayed.    Liver Function Tests: Recent Labs  Lab 11/11/18 0226 11/12/18 0526 11/13/18 0316 11/13/18 2210  AST 73* 136* 115* 89*  ALT 20 41 40 35  ALKPHOS 46 55 64 62  BILITOT 3.3* 4.4* 6.3* 8.8*  PROT 4.2* 5.3* 6.3* 6.5  ALBUMIN 1.6* 2.4* 2.8* 2.8*   No results for input(s): LIPASE, AMYLASE in the last 168 hours. Recent Labs  Lab 11/11/18 0650 11/13/18 0446  AMMONIA 68* 68*    ABG    Component Value Date/Time   PHART 7.30 (L) 11/11/2018 0845   PCO2ART 35 11/11/2018 0845   PO2ART 84 11/11/2018 0845   HCO3 17.2 (L) 11/11/2018 0845   ACIDBASEDEF 8.4 (H) 11/11/2018 0845   O2SAT 95.1 11/11/2018 0845      Coagulation Profile: Recent Labs  Lab 11/11/18 0226 11/12/18 0526 11/13/18 0316  INR 2.5* 1.7* 1.6*    Cardiac Enzymes: No results for input(s): CKTOTAL, CKMB, CKMBINDEX, TROPONINI in the last 168 hours.  HbA1C: Hgb A1c MFr Bld  Date/Time Value Ref Range Status  08/29/2018 05:39 AM 3.7 (L) 4.8 - 5.6 % Final    Comment:    (NOTE) Pre diabetes:          5.7%-6.4% Diabetes:              >  6.4% Glycemic control for   <7.0% adults with diabetes     CBG: Recent Labs  Lab 11/12/18 0758 11/12/18 1138 11/12/18 1627 11/12/18 2118 11/13/18 0739  GLUCAP 124* 122* 131* 141* 98    Review of Systems:   She has no specific complaints no complaints of abdominal pain or other  Past Medical History  She,  has a past medical history of Alcohol abuse, Anxiety, and Cirrhosis ([place]).   Surgical History    Past Surgical History:  Procedure Laterality Date  . ESOPHAGOGASTRODUODENOSCOPY (EGD) WITH PROPOFOL N/A 06/08/2018   Procedure: ESOPHAGOGASTRODUODENOSCOPY (EGD) WITH PROPOFOL;  Surgeon: Lucilla Lame, MD;  Location[facility] ENDOSCOPY;  Service: Endoscopy;  Laterality: N/A;  . ESOPHAGOGASTRODUODENOSCOPY (EGD) WITH PROPOFOL N/A 11/12/2018   Procedure: ESOPHAGOGASTRODUODENOSCOPY (EGD) WITH PROPOFOL;  Surgeon: Jonathon Bellows, MD;  Location: [facility] ENDOSCOPY;  Service: Gastroenterology;  Laterality: N/A;     Social History   reports that she has quit smoking. She has never used smokeless tobacco. She reports current alcohol use.   Family History   Her family history is not on file.   Allergies Allergies  Allergen Reactions  . Benadryl [Diphenhydramine] Other (See Comments)    Reaction: makes crazy and can't sleep for days  . Ciprofloxacin Itching  . Latex Hives  . Morphine And Related Hives  . Penicillins Hives and Other (See Comments)    Did it involve swelling of the face/tongue/throat, SOB, or low BP? No Did it involve sudden or severe rash/hives, skin peeling, or any reaction on the  inside of your mouth or nose? Unknown Did you need to seek medical attention at a hospital or doctor's office? Unknown When did it last happen?unknown If all above answers are "NO", may proceed with cephalosporin use.  **patient tolerates ceftriaxone - Aug 2020   . Sulfa Antibiotics Hives  . Tetracyclines & Related Hives     Home Medications  Prior to Admission medications   Medication Sig Start Date End Date Taking? Authorizing Provider  folic acid (FOLVITE) 1 MG tablet Take 1 tablet (1 mg total) by mouth daily. 10/19/18  Yes Henreitta Leber, MD  lactulose (CHRONULAC) 10 GM/15ML solution 20gm oral twice a day (goal is 3 bowel movements a day, can go to three or four times a day dosing if needed) 10/19/18  Yes Sainani, Belia Heman, MD  pantoprazole (PROTONIX) 40 MG tablet Take 1 tablet (40 mg total) by mouth daily. 10/19/18  Yes Sainani, Belia Heman, MD  thiamine 100 MG tablet Take 1 tablet (100 mg total) by mouth daily. 10/19/18  Yes Sainani, Belia Heman, MD  cefTRIAXone 1 g in sodium chloride 0.9 % 100 mL Inject 1 g into the vein daily. 11/14/18   Awilda Bill, NP  lactulose (CHRONULAC) 10 GM/15ML solution Take 45 mLs (30 g total) by mouth 3 (three) times daily. 11/13/18   Awilda Bill, NP  LORazepam (ATIVAN) 1 MG tablet Take 1 tablet (1 mg total) by mouth every 6 (six) hours as needed (CIWA-AR > 8  -OR-  withdrawal symptoms:  anxiety, agitation, insomnia, diaphoresis, nausea, vomiting, tremors, tachycardia, or hypertension.). 11/13/18   Awilda Bill, NP  LORazepam (ATIVAN) 2 MG/ML injection Inject 0.5 mLs (1 mg total) into the vein every 6 (six) hours as needed (CIWA-AR > 8-OR-withdrawal symptoms:anxiety, agitation, insomnia, diaphoresis, nausea, vomiting, tremors tachycardia, or hypertension.). 11/13/18   Awilda Bill, NP  mupirocin ointment (BACTROBAN) 2 % Place 1 application into the nose 2 (two) times daily. 11/13/18  Awilda Bill, NP  octreotide 500 mcg in sodium chloride 0.9 %  250 mL Inject 50 mcg/hr into the vein continuous. 11/13/18   Awilda Bill, NP  ondansetron (ZOFRAN) 4 MG/2ML SOLN injection Inject 2 mLs (4 mg total) into the vein every 6 (six) hours as needed for nausea. 11/13/18   Awilda Bill, NP  pantoprazole (PROTONIX) 40 MG injection Inject 40 mg into the vein every 12 (twelve) hours. 11/13/18   Awilda Bill, NP  phytonadione 10 mg in dextrose 5 % 50 mL Inject 10 mg into the vein daily. 11/14/18   Awilda Bill, NP  sodium chloride 0.9 % SOLN 50 mL with thiamine 100 MG/ML SOLN 500 mg Inject 500 mg into the vein daily. 11/14/18   Awilda Bill, NP  sodium chloride flush (NS) 0.9 % SOLN 10-40 mLs by Intracatheter route every 12 (twelve) hours. 11/13/18   Awilda Bill, NP  sodium chloride flush (NS) 0.9 % SOLN 10-40 mLs by Intracatheter route as needed (flush). 11/13/18   Awilda Bill, NP  thiamine 123XX123 mg, folic acid 1 mg, multivitamins adult 10 mL in sodium chloride 0.9 % 1,000 mL Inject 100 mL/hr into the vein daily. 11/13/18   Awilda Bill, NP     Critical care time: 35 minutes spent in bedside evaluation chart review and critical care planning.

## 2018-11-14 NOTE — H&P (Addendum)
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  Transferred here for emergent TIPS.  Past Medical History   . Alcoholic intoxication with complication ([place]) 123XX123  . Hepatic encephalopathy ([place]) 08/27/2018  . GI bleeding 06/08/2018  . Alcoholic cirrhosis of liver without ascites ([place])      Significant Hospital Events   Transferred to [facility] for emergent TIPS  Consults:  Gastroenterology radiology and interventional radiology  Procedures:  EGD 8/20: there was no evidence of an upper GI bleed.  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 There was evidence of recent intraperitoneal bleeding with layering blood in the patient's ascites.    Significant Diagnostic Tests:  CT angiography  Micro Data:  NA  Antimicrobials:  NA  Interim history/subjective:  Resting comfortably hgb stable for over 48 hrs   Objective   Blood pressure (Abnormal) 146/84, pulse (Abnormal) 101, temperature 99.7 F (37.6 C), temperature source Oral, resp. rate (Abnormal) 26, weight 65.3 kg, SpO2 90 %.        Intake/Output Summary (Last 24 hours) at 11/14/2018 0851 Last data filed at 11/14/2018 0800 Gross per 24 hour  Intake 782.22 ml   Output 425 ml  Net 357.22 ml   Filed Weights   11/14/18 0500  Weight: 65.3 kg    Examination: General: Pleasant 53 year old white female resting in bed she is in no acute distress currently HEENT normocephalic atraumatic sclera slightly icteric mucous membranes moist Pulmonary: Clear to auscultation diminished bases Cardiac: Regular rate and rhythm, does have systolic murmur Abdomen: There are positive bowel sounds Extremities: Warm and dry no edema brisk cap refill Neuro: Awake oriented currently x3 moves all extremities GU: Voids spontaneously  Resolved Hospital Problem list   Acute respiratory failure  Hemorrhagic shock AKI Assessment & Plan:   intraperitoneal bleed from colonic varices from portosystemic shunt in pt w/ ETOH related cirrhosis  : requiring massive blood transfusion:  8 units packed RBCs 4 units fresh frozen plasma 2 units platelets  -hgb currently stable (actually improved from 10.4 to 11.1) -plts holding (60) Plan Awaiting IR consult for TIPS, they would like input from GI given rising total bilirubin and concern about underlying hepatic function Serial cbcs and coags Cont octreotide gtt Transfuse for Hgb < 7 or for sudden hgb w/ associated hemodynamic compromise INR goal < 1.5, will give vitamin K Cont empiric ceftriaxone   Acute encephalopathy: Seemingly improved Plan Avoid benzodiazepines Continue lactulose Supportive care  History of alcohol abuse: Monitor for withdrawal symptoms once more awake Plan Monitor for symptoms of withdrawal  At risk for fluid and electrolyte imbalance: hypophosphatemia  Plan Trend daily and replace as needed Replacing mg today   Best practice:  Diet: N.p.o. Pain/Anxiety/Delirium protocol (if indicated): Continue with sedation as indicated VAP protocol (if indicated): Yes DVT  prophylaxis: No high risk for bleeding GI prophylaxis: Yes Glucose control: Yes Mobility: Bedrest Code Status: Full Family  Communication: Pending Disposition: Transfer to [facility], ICU   Critical care time:  NA

## 2018-11-14 NOTE — Consult Note (Signed)
CROSS COVER [facility] Reason for Consult: Alcoholic cirrhosis with varices. Referring Physician: CCM.  Lisa Dennis is an 53 y.o. female.  HPI: Lisa Dennis is a 53 year old white female, with a history of alcohol abuse, who was admitted to [facility] on 11/11/2018 for loss of consciousness and shock requiring received aggressive resuscitation with 8 units of packed red blood cells 2 units of platelets and 4 units of FFP.  Subsequent to resuscitation EGD was performed which showed grade 1 varices but no evidence of fresh blood in the stomach.  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  The dominant variceal system seemed to arise from the superior mesenteric vein specifically the right colic branch which is very tortuous and enlarged and travels of the right hemiabdomen and to the wall of the ascending colon before joining the systemic system by the right ovarian vein: The right ovarian vein was diffusely enlarged and hypervascular and massive varices were noted with the vein passes through the wall of the ascending colon the IVC and iliac veins were patent; Patient was transferred to [facility] for possible TIPS.  Patient has been relatively stable here since transfer from our [facility].  She is confused and cannot give me much history.  She could not tell me the name of the hospital she is at.  Most of the history is been procured by chart review.  She has had a EGD done on 06/08/2019 grade 1 esophageal varices were noted in the distal esophagus along with a moderate-sized  hiatal hernia. On a recent hospitalization repeat endoscopy done on 11/12/2018 revealed grade 1 varices with portal hypertensive gastropathy and no evidence of fresh or old heme in the stomach of the proximal small bowel; no gastric or duodenal varices were described in the endoscopy.    Past Medical History:  Diagnosis Date  . Alcohol abuse   . Anxiety   . Cirrhosis [facility])    Past Surgical History:  Procedure Laterality Date  . ESOPHAGOGASTRODUODENOSCOPY (EGD) WITH PROPOFOL N/A 06/08/2018   Procedure: ESOPHAGOGASTRODUODENOSCOPY (EGD) WITH PROPOFOL;  Surgeon: Lucilla Lame, MD;  Location: [facility] ENDOSCOPY;  Service: Endoscopy;  Laterality: N/A;  . ESOPHAGOGASTRODUODENOSCOPY (EGD) WITH PROPOFOL N/A 11/12/2018   Procedure: ESOPHAGOGASTRODUODENOSCOPY (EGD) WITH PROPOFOL;  Surgeon: Jonathon Bellows, MD;  Location: [facility] ENDOSCOPY;  Service: Gastroenterology;  Laterality: N/A;   History reviewed. No pertinent family history.  Social History:  reports that she has quit smoking. She has never used smokeless tobacco. She reports current alcohol use. No history on file for drug.  Allergies:  Allergies  Allergen Reactions  . Benadryl [Diphenhydramine] Other (See Comments)    Reaction: makes crazy and can't sleep for days  . Ciprofloxacin Itching  . Latex Hives  . Morphine And Related Hives  . Penicillins Hives and Other (See Comments)    Did it involve swelling of the face/tongue/throat, SOB, or low BP? No Did it involve sudden or severe rash/hives, skin peeling, or any reaction on the inside of your mouth or nose? Unknown Did you need to seek medical attention at a hospital or doctor's office? Unknown When did it  last happen?unknown If all above answers are "NO", may proceed with cephalosporin use.  **patient tolerates ceftriaxone - Aug 2020   . Sulfa Antibiotics Hives  . Tetracyclines & Related Hives   Medications: I have reviewed the patient's current medications.  Results for orders placed or  performed during the hospital encounter of 11/13/18 (from the past 48 hour(s))  CBC     Status: Abnormal   Collection Time: 11/13/18 10:10 PM  Result Value Ref Range   WBC 7.4 4.0 - 10.5 K/uL   RBC 3.40 (L) 3.87 - 5.11 MIL/uL   Hemoglobin 10.4 (L) 12.0 - 15.0 g/dL   HCT 31.7 (L) 36.0 - 46.0 %   MCV 93.2 80.0 - 100.0 fL   MCH 30.6 26.0 - 34.0 pg   MCHC 32.8 30.0 - 36.0 g/dL   RDW 17.7 (H) 11.5 - 15.5 %   Platelets 58 (L) 150 - 400 K/uL    Comment: REPEATED TO VERIFY PLATELET COUNT CONFIRMED BY SMEAR Immature Platelet Fraction may be clinically indicated, consider ordering this additional test [id number]    nRBC 0.0 0.0 - 0.2 %    Comment: Performed at [facility], [place] [address], [place], [place] [address]  Comprehensive metabolic panel     Status: Abnormal   Collection Time: 11/13/18 10:10 PM  Result Value Ref Range   Sodium 137 135 - 145 mmol/L   Potassium 3.7 3.5 - 5.1 mmol/L   Chloride 104 98 - 111 mmol/L   CO2 23 22 - 32 mmol/L   Glucose, Bld 134 (H) 70 - 99 mg/dL   BUN 6 6 - 20 mg/dL   Creatinine, Ser 0.51 0.44 - 1.00 mg/dL   Calcium 8.2 (L) 8.9 - 10.3 mg/dL   Total Protein 6.5 6.5 - 8.1 g/dL   Albumin 2.8 (L) 3.5 - 5.0 g/dL   AST 89 (H) 15 - 41 U/L   ALT 35 0 - 44 U/L   Alkaline Phosphatase 62 38 - 126 U/L   Total Bilirubin 8.8 (H) 0.3 - 1.2 mg/dL   GFR calc non Af Amer >60 >60 mL/min   GFR calc Af Amer >60 >60 mL/min   Anion gap 10 5 - 15    Comment: Performed at [facility] [address] [place], [place] [address]  CBC     Status: Abnormal   Collection Time: 11/14/18  3:12 AM  Result Value Ref Range   WBC 7.3 4.0 - 10.5 K/uL   RBC 3.64 (L) 3.87 - 5.11 MIL/uL   Hemoglobin 11.1 (L) 12.0 - 15.0 g/dL   HCT 34.0 (L) 36.0 - 46.0 %   MCV 93.4 80.0 - 100.0 fL   MCH 30.5 26.0 - 34.0 pg   MCHC 32.6 30.0 - 36.0 g/dL   RDW 17.8 (H) 11.5 - 15.5 %   Platelets 60 (L) 150 - 400 K/uL    Comment: REPEATED TO VERIFY Immature Platelet Fraction may  be clinically indicated, consider ordering this additional test [id number] CONSISTENT WITH PREVIOUS RESULT    nRBC 0.0 0.0 - 0.2 %    Comment: Performed at [facility], [place] [address], [place], [place] [address]  Magnesium     Status: None   Collection Time: 11/14/18  3:12 AM  Result Value Ref Range   Magnesium 1.7 1.7 - 2.4 mg/dL    Comment: Performed at [facility] [address], [place], [place] [address]  Phosphorus     Status: Abnormal  Collection Time: 11/14/18  3:12 AM  Result Value Ref Range   Phosphorus 1.8 (L) 2.5 - 4.6 mg/dL    Comment: Performed at [facility] [address], [place], [place] [address]  Comprehensive metabolic panel     Status: Abnormal   Collection Time: 11/14/18  3:12 AM  Result Value Ref Range   Sodium 136 135 - 145 mmol/L   Potassium 3.8 3.5 - 5.1 mmol/L   Chloride 105 98 - 111 mmol/L   CO2 23 22 - 32 mmol/L   Glucose, Bld 104 (H) 70 - 99 mg/dL   BUN 7 6 - 20 mg/dL   Creatinine, Ser 0.49 0.44 - 1.00 mg/dL   Calcium 8.1 (L) 8.9 - 10.3 mg/dL   Total Protein 6.7 6.5 - 8.1 g/dL   Albumin 2.9 (L) 3.5 - 5.0 g/dL   AST 81 (H) 15 - 41 U/L   ALT 35 0 - 44 U/L   Alkaline Phosphatase 68 38 - 126 U/L   Total Bilirubin 9.1 (H) 0.3 - 1.2 mg/dL   GFR calc non Af Amer >60 >60 mL/min   GFR calc Af Amer >60 >60 mL/min   Anion gap 8 5 - 15    Comment: Performed at [facility] [address], [place], [place] [address]  Protime-INR     Status: Abnormal   Collection Time: 11/14/18  9:19 AM  Result Value Ref Range   Prothrombin Time 20.7 (H) 11.4 - 15.2 seconds   INR 1.8 (H) 0.8 - 1.2    Comment: (NOTE) INR goal varies based on device and disease states. Performed at [facility], [place] [address], [place], [place] [address]     US Paracentesis  Result Date: 11/13/2018 INDICATION: 53 year old with fever. Ascites. Patient initially presented with low hemoglobin. EXAM: ULTRASOUND GUIDED PARACENTESIS MEDICATIONS: None.  COMPLICATIONS: None immediate. PROCEDURE: Informed written consent was obtained from the patient after a discussion of the risks, benefits and alternatives to treatment. A timeout was performed prior to the initiation of the procedure. Initial ultrasound scanning demonstrates a large amount of ascites within the left lower abdominal quadrant. The right lower abdomen was prepped and draped in the usual sterile fashion. 1% lidocaine was used for local anesthesia. Following this, a 6 Fr Safe-T-Centesis catheter was introduced. An ultrasound image was saved for documentation purposes. The paracentesis was performed. The catheter was removed and a dressing was applied. The patient tolerated the procedure well without immediate post procedural complication. FINDINGS: A total of approximately 2 L of bloody fluid was removed. Samples were sent to the laboratory as requested by the clinical team. Paracentesis was stopped after 2 L due to the bloody appearance of the fluid. IMPRESSION: Successful ultrasound-guided paracentesis yielding 2 liters of peritoneal fluid. These results were called by telephone at the time of interpretation on 11/13/2018 at 3:22 pm to Dr. Jonathon Bellows , who verbally acknowledged these results. Electronically Signed   By: Markus Daft M.D.   On: 11/13/2018 16:18   Ct Angio Abd/pel W/ And/or W/o  Result Date: 11/13/2018 CLINICAL DATA:  53 year old female with alcoholic cirrhosis, esophageal varices and upper GI bleeding. Recent endoscopy did not find evidence of recent bleeding from the grade 1 varices. EXAM: CT ANGIOGRAPHY ABDOMEN AND PELVIS WITH CONTRAST AND WITHOUT CONTRAST TECHNIQUE: Multidetector CT imaging of the abdomen and pelvis was performed using the standard protocol during bolus administration of intravenous contrast. Multiplanar reconstructed images and MIPs were obtained and reviewed to  evaluate the vascular anatomy. CONTRAST:  120mL OMNIPAQUE IOHEXOL 350 MG/ML SOLN COMPARISON:  MRI  abdomen 10/21/2018 FINDINGS: VASCULAR Aorta: Normal in caliber. Minimal atherosclerotic vascular calcifications. Celiac: The celiac axis gives rise to the left gastric and splenic artery. No stenosis, aneurysm or dissection. SMA: Completely replaced common hepatic artery. The origin of SMA is widely patent. Renals: Solitary renal arteries bilaterally. The renal arteries are widely patent without evidence of aneurysm, dissection or FMD. IMA: Patent without evidence of aneurysm, dissection, vasculitis or significant stenosis. Inflow: Patent without evidence of aneurysm, dissection, vasculitis or significant stenosis. Proximal Outflow: Bilateral common femoral and visualized portions of the superficial and profunda femoral arteries are patent without evidence of aneurysm, dissection, vasculitis or significant stenosis. 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 There are small esophageal varices arising from the posterior gastric vein. However, the dominant variceal system arises from the superior mesenteric vein, specifically the right colic branch which is highly tortuous and enlarged and travels throughout the right hemiabdomen and through the wall of the ascending colon before joining the systemic system via the right ovarian vein. The right ovarian vein is diffusely enlarged and hypervascular. Where the vein passes through the wall of the ascending colon, there are massive varices. Additionally, there is a small network of varices in the duodenum at the junction of the third and fourth portions which appear to communicate with the ovarian vein as well. No gastric varices visualized. Patent and unremarkable renal veins. The IVC and iliac veins are all patent. Review of the MIP images confirms the above findings. NON-VASCULAR Lower chest: Cardiomegaly. No pericardial effusion. Trace bilateral pleural effusions with associated atelectasis. Hepatobiliary: Diffusely nodular hepatic  contour consistent with cirrhosis. No arterially enhancing lesion is evident. No intra or extrahepatic biliary ductal dilatation. Multiple small stones layer dependently within the gallbladder lumen. The common bile duct is normal. Pancreas: Unremarkable. No pancreatic ductal dilatation or surrounding inflammatory changes. Spleen: Marked splenomegaly. Adrenals/Urinary Tract: Normal adrenal glands. The kidneys are unremarkable. No hydronephrosis. Unremarkable ureters. The bladder is decompressed with a Foley catheter in place. Stomach/Bowel: Please see variceal description in the venous section above. There is no evidence of intraluminal hemorrhage at this time. No evidence of bowel obstruction or focal bowel wall thickening. Lymphatic: No suspicious lymphadenopathy. Reproductive: Uterus and bilateral adnexa are unremarkable. Other: Mild to moderate ascites. Of note, high attenuation material is layering adjacent to the varices in the right pericolic gutter and also more dependently in the pelvis consistent with hemoperitoneum. Musculoskeletal: No acute fracture or aggressive appearing lytic or blastic osseous lesion. Focal L5-S1 degenerative disc disease. IMPRESSION: VASCULAR 1. 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 Additionally, there are small jejunal varices at the junction of the third and fourth portion of the duodenum which also drain via the right gonadal vein. There is evidence of recent intraperitoneal bleeding with layering blood products in the patient's ascites. These very large varices may be at high risk for continued bleeding. 2. The portal veins remain patent. 3. Small esophageal varices consistent with grade 1 varices as described on the recent upper endoscopy. 4.  Aortic Atherosclerosis (ICD10-170.0). 5. 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 NON-VASCULAR 1. Hepatic  cirrhosis with portal hypertension including splenomegaly and extensive portosystemic varices as described above. 2. Mild to moderate ascites with layering blood products consistent with recent hemorrhage. 3. Cholelithiasis. 4. Cardiomegaly. 5. Small bilateral pleural  effusions and associated atelectasis. These results were called by telephone at the time of interpretation on 11/13/2018 at 5:40 pm to Dr. Vernard Gambles , who verbally acknowledged these results. Signed, Criselda Peaches, MD, [facility] Electronically Signed   By: Jacqulynn Cadet M.D.   On: 11/13/2018 17:42   ROS Blood pressure (!) 142/73, pulse 74, temperature 100.1 F (37.8 C), temperature source Oral, resp. rate 15, weight 65.3 kg, SpO2 93 %. Physical Exam  Constitutional: She appears well-developed and well-nourished. No distress.  HENT:  Head: Normocephalic and atraumatic.  Eyes: Pupils are equal, round, and reactive to light. EOM are normal. Scleral icterus is present.  Neck: Normal range of motion. Neck supple.  Cardiovascular: Normal rate and regular rhythm.  Respiratory: Effort normal and breath sounds normal.  GI: Soft. She exhibits no mass. There is no abdominal tenderness. There is no rebound and no guarding.  Neurological:  Confused and not oriented to place and time  Skin: Skin is warm and dry.   Assessment/Plan: 1) Alcoholic cirrhosis with number thrombocytopenia platelet count of 60 K; severe portal hypertension and ectopic variceal pattern-grade 1 esophageal varices with jejunal varices and massive ascending colon varices noted causing an subperitoneal bleed-noted on paracentesis done yesterday.  I agree with Dr. Margaretmary Dys evaluation I think we should wait till the bilirubin improves a little bit before the TIPS is done as there is a risk of decompensation under the present circumstances.  However if the patient has a recurrent bleed that might  be her only option at this time considering the very abnormal pattern of her varices.  It would be beneficial to continue on octreotide for now.  Patient is status post massive volume resuscitation after acute hemorrhagic shock from an intraperitoneal bleed. 2) Acute encephalopathy-we will need close monitoring.Marland Kitchen 3) History of alcohol abuse-we will need monitoring for withdrawal; we will need to minimize sedation. 4) Mild acute kidney injury.  Juanita Craver 11/14/2018, 4:38 PM

## 2018-11-14 NOTE — Consult Note (Signed)
Chief Complaint: Patient was seen in consultation today for abdominal varcies  Referring Physician(s): Dr. Anna/Dr. Stark Jock  Supervising Physician: Aletta Edouard  Patient Status: [facility] - In-pt  History of Present Illness: Lisa [facility] is a 53 y.o. 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  Patient was hypotensive, hypoxic, with altered mental status requiring intubation.  Her hemoglobin was 3.8. GI was consulted for possible upper GI bleed.  Patient underwent endoscopy 11/12/2018 which revealed normal duodenum, grade 1 esophageal varices, portal hypertensive gastropathy, but no bleeding source.  Paracentesis was performed by Dr. Anselm Pancoast for SBP rule out and was found to be extremely bloody.  Due to concern for abdominal bleed she then underwent CTA Abdomen Pelvis which showed:  IMPRESSION: VASCULAR  1. 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 Additionally, there are small jejunal varices at the junction of the third and fourth portion of the duodenum which also drain via the right gonadal vein. There is evidence of recent intraperitoneal bleeding with layering blood products in the patient's ascites. These very large varices may be at high risk for continued bleeding. 2. The portal veins remain patent. 3. Small esophageal varices consistent with grade 1 varices as described on the recent upper endoscopy. 4.  Aortic Atherosclerosis (ICD10-170.0). 5. 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  NON-VASCULAR  1. Hepatic cirrhosis with portal hypertension including splenomegaly and extensive portosystemic varices as described above. 2. Mild to moderate ascites with layering blood products  consistent with recent hemorrhage. 3. Cholelithiasis. 4. Cardiomegaly. 5. Small bilateral pleural effusions and associated atelectasis.   Patient was transferred to [facility] for ongoing evaluation and possible TIPS.   Patient assessed at bedside alongside Dr. Kathlene Cote. Patient is able to participate in conversation however is easily distracted and derails conversation. Lists her mother as her emergency contact. Denies abdominal pain.   Past Medical History:  Diagnosis Date   Alcohol abuse    Anxiety    Cirrhosis ([place])     Past Surgical History:  Procedure Laterality Date   ESOPHAGOGASTRODUODENOSCOPY (EGD) WITH PROPOFOL N/A 06/08/2018   Procedure: ESOPHAGOGASTRODUODENOSCOPY (EGD) WITH PROPOFOL;  Surgeon: Lucilla Lame, MD;  Location: [facility] ENDOSCOPY;  Service: Endoscopy;  Laterality: N/A;   ESOPHAGOGASTRODUODENOSCOPY (EGD) WITH PROPOFOL N/A 11/12/2018   Procedure: ESOPHAGOGASTRODUODENOSCOPY (EGD) WITH PROPOFOL;  Surgeon: Jonathon Bellows, MD;  Location: [facility];  Service: Gastroenterology;  Laterality: N/A;    Allergies: Benadryl [diphenhydramine], Ciprofloxacin, Latex, Morphine and related, Penicillins, Sulfa antibiotics, and Tetracyclines & related  Medications: Prior to Admission medications   Medication Sig Start Date End Date Taking? Authorizing Provider  cefTRIAXone 1 g in sodium chloride 0.9 % 100 mL Inject 1 g into the vein daily. 11/14/18   Awilda Bill, NP  lactulose (CHRONULAC) 10 GM/15ML solution Take 45 mLs (30 g total) by mouth 3 (three) times daily. 11/13/18   Awilda Bill, NP  LORazepam (ATIVAN) 1 MG tablet Take 1 tablet (1 mg total) by mouth every 6 (six) hours as needed (CIWA-AR > 8  -OR-  withdrawal symptoms:  anxiety, agitation, insomnia, diaphoresis, nausea, vomiting, tremors, tachycardia, or hypertension.). 11/13/18   Awilda Bill, NP  LORazepam (ATIVAN) 2 MG/ML injection Inject 0.5 mLs (1 mg total) into the vein every 6 (six) hours as needed (CIWA-AR >  8-OR-withdrawal symptoms:anxiety, agitation, insomnia, diaphoresis, nausea, vomiting, tremors tachycardia, or hypertension.). 11/13/18   Awilda Bill, NP  mupirocin ointment (BACTROBAN) 2 % Place 1 application into the nose 2 (two) times daily. 11/13/18   Awilda Bill, NP  octreotide 500 mcg in sodium chloride 0.9 % 250 mL Inject 50 mcg/hr into the vein continuous. 11/13/18   Awilda Bill, NP  ondansetron (ZOFRAN) 4 MG/2ML SOLN injection Inject 2 mLs (4 mg total) into the vein every 6 (six) hours as needed for nausea. 11/13/18   Awilda Bill, NP  pantoprazole (PROTONIX) 40 MG injection Inject 40 mg into the vein every 12 (twelve) hours. 11/13/18   Awilda Bill, NP  phytonadione 10 mg in dextrose 5 % 50 mL Inject 10 mg into the vein daily. 11/14/18   Awilda Bill, NP  sodium chloride 0.9 % SOLN 50 mL with thiamine 100 MG/ML SOLN 500 mg Inject 500 mg into the vein daily. 11/14/18   Awilda Bill, NP  sodium chloride flush (NS) 0.9 % SOLN 10-40 mLs by Intracatheter route every 12 (twelve) hours. 11/13/18   Awilda Bill, NP  sodium chloride flush (NS) 0.9 % SOLN 10-40 mLs by Intracatheter route as needed (flush). 11/13/18   Awilda Bill, NP  thiamine 810 mg, folic acid 1 mg, multivitamins adult 10 mL in sodium chloride 0.9 % 1,000 mL Inject 100 mL/hr into the vein daily. 11/13/18   Awilda Bill, NP     History reviewed. No pertinent family history.  Social History   Socioeconomic History   Marital status: Divorced    Spouse name: Not on file   Number of children: 1   Years of education: Not on file   Highest education level: Bachelor's degree (e.g., BA, AB, BS)  Occupational History   Not on file  Social Needs   Financial resource strain: Not hard at all   Food insecurity    Worry: Never true    Inability: Never true   Transportation needs    Medical: No    Non-medical: No  Tobacco Use   Smoking status: Former Smoker   Smokeless tobacco:  Never Used  Substance and Sexual Activity   Alcohol use: Yes    Comment: heavy   Drug use: Not on file   Sexual activity: Not Currently  Lifestyle   Physical activity    Days per week: 0 days    Minutes per session: 0 min   Stress: Very much  Relationships   Social connections    Talks on phone: More than three times a week    Gets together: Once a week    Attends religious service: Never    Active member of club or organization: No    Attends meetings of clubs or organizations: Never    Relationship status: Divorced  Other Topics Concern   Not on file  Social History Narrative   Pt recently relocated from [place] 7weeks ago.     Review of Systems: A 12 point ROS discussed and pertinent positives are indicated in the HPI above.  All other systems are negative.  Review of Systems  Constitutional: Negative for fatigue and fever.  Respiratory: Negative for cough and shortness of breath.   Cardiovascular: Negative for chest pain.  Gastrointestinal: Negative for abdominal pain, blood in stool, nausea and vomiting.  Musculoskeletal: Negative for back pain.  Psychiatric/Behavioral: Positive for confusion. Negative for behavioral problems.    Vital Signs: BP 134/82  Pulse 80    Temp 99.7 F (37.6 C) (Oral)    Resp 20    Wt 143 lb 15.4 oz (65.3 kg)    SpO2 93%    BMI 24.70 kg/m   Physical Exam Vitals signs and nursing note reviewed.  Constitutional:      Appearance: Normal appearance.  HENT:     Mouth/Throat:     Mouth: Mucous membranes are moist.     Pharynx: Oropharynx is clear.  Neck:     Musculoskeletal: Normal range of motion and neck supple.     Comments: No venous distention Cardiovascular:     Rate and Rhythm: Normal rate and regular rhythm.     Heart sounds: No murmur. No friction rub. No gallop.   Pulmonary:     Effort: Pulmonary effort is normal. No respiratory distress.     Breath sounds: Normal breath sounds.  Abdominal:     General: Abdomen is  flat. There is no distension.     Palpations: Abdomen is soft.     Tenderness: There is no abdominal tenderness.  Skin:    General: Skin is warm and dry.  Neurological:     General: No focal deficit present.     Mental Status: She is alert and oriented to person, place, and time. Mental status is at baseline.  Psychiatric:        Mood and Affect: Mood normal.        Behavior: Behavior normal.        Thought Content: Thought content normal.        Judgment: Judgment normal.      MD Evaluation Airway: WNL Heart: WNL Abdomen: WNL Chest/ Lungs: WNL ASA  Classification: 3 Mallampati/Airway Score: One   Imaging: Dg Chest 1 View  Result Date: 11/11/2018 CLINICAL DATA:  Post intubation. GI bleed. EXAM: CHEST  1 VIEW COMPARISON:  Radiograph 08/28/2018 FINDINGS: Endotracheal tube tip 2.5 cm from the carina at the thoracic inlet. Tip and side port of the enteric tube below the diaphragm in the stomach. Low lung volumes. Streaky bibasilar opacities consistent with atelectasis. Normal heart size and mediastinal contours. No large pleural effusion or pneumothorax. No pulmonary edema. IMPRESSION: 1. Endotracheal tube tip 2.5 cm from the carina at the thoracic inlet. Enteric tube in place with tip and side-port below the diaphragm. 2. Low lung volumes with bibasilar atelectasis. Electronically Signed   By: Keith Rake M.D.   On: 11/11/2018 03:30   Mr Liver W Wo Contrast  Result Date: 10/21/2018 CLINICAL DATA:  History of cirrhosis with elevated liver enzymes. EXAM: MRI ABDOMEN WITHOUT AND WITH CONTRAST TECHNIQUE: Multiplanar multisequence MR imaging of the abdomen was performed both before and after the administration of intravenous contrast. CONTRAST:  4 cc Gadavist COMPARISON:  Ultrasound 06/08/18 FINDINGS: Lower chest: No acute findings. Hepatobiliary: The liver as a macronodular contour with enlargement of the lateral segment of left lobe of liver. There is diffuse hepatic steatosis  identified. Multiple dysplastic nodules are identified throughout both lobes of liver. These show mild increased T1 signal with signal dropout on the out of phase sequences. On the postcontrast arterial phase sequences there is a single small arterial phase enhancing nodule between segment 8 and segment 4 measuring 4 mm, image 30/11. There is no corresponding washout or pseudo capsule identified on the delayed sequences. This is compatible with LR-3 lesion, intermediate probability for malignancy. Gallbladder sludge. No wall thickening. No biliary ductal dilatation identified. Pancreas: No mass, inflammatory changes, or other  parenchymal abnormality identified. Spleen: No focal splenic abnormality. The spleen measures 11.9 cm in cranial caudal dimension. Adrenals/Urinary Tract: No masses identified. No evidence of hydronephrosis. Stomach/Bowel: Visualized portions within the abdomen are unremarkable. Vascular/Lymphatic: Normal appearance of the abdominal aorta. No aneurysm. Esophageal varices. Large portal venous shunt is identified within the right upper quadrant. No adenopathy. Other:  Small volume of perihepatic ascites. Musculoskeletal: No suspicious bone lesions identified. IMPRESSION: 1. Morphologic features of liver compatible with cirrhosis. Stigmata of portal venous hypertension is noted including ascites and varices. 2. Numerous dysplastic nodules are identified throughout both lobes of liver. No diagnostic features of Guttenberg identified at this time. 3. LR-3 nodule within the liver measures 4 mm. This is intermediate probability for malignancy. Follow-up imaging in 6 months with repeat contrast enhanced MRI is recommended. Electronically Signed   By: Kerby Moors M.D.   On: 10/21/2018 12:39   US Paracentesis  Result Date: 11/13/2018 INDICATION: 54 year old with fever. Ascites. Patient initially presented with low hemoglobin. EXAM: ULTRASOUND GUIDED PARACENTESIS MEDICATIONS: None. COMPLICATIONS: None  immediate. PROCEDURE: Informed written consent was obtained from the patient after a discussion of the risks, benefits and alternatives to treatment. A timeout was performed prior to the initiation of the procedure. Initial ultrasound scanning demonstrates a large amount of ascites within the left lower abdominal quadrant. The right lower abdomen was prepped and draped in the usual sterile fashion. 1% lidocaine was used for local anesthesia. Following this, a 6 Fr Safe-T-Centesis catheter was introduced. An ultrasound image was saved for documentation purposes. The paracentesis was performed. The catheter was removed and a dressing was applied. The patient tolerated the procedure well without immediate post procedural complication. FINDINGS: A total of approximately 2 L of bloody fluid was removed. Samples were sent to the laboratory as requested by the clinical team. Paracentesis was stopped after 2 L due to the bloody appearance of the fluid. IMPRESSION: Successful ultrasound-guided paracentesis yielding 2 liters of peritoneal fluid. These results were called by telephone at the time of interpretation on 11/13/2018 at 3:22 pm to Dr. Jonathon Bellows , who verbally acknowledged these results. Electronically Signed   By: Markus Daft M.D.   On: 11/13/2018 16:18   Dg Chest Port 1 View  Result Date: 11/12/2018 CLINICAL DATA:  Respiratory failure EXAM: PORTABLE CHEST 1 VIEW COMPARISON:  11/11/2018 FINDINGS: Endotracheal tube and NG tube are unchanged. Low lung volumes. Mild cardiomegaly. Vascular congestion. Patchy bilateral airspace opacities have increased since prior study. IMPRESSION: Cardiomegaly, vascular congestion. Patchy bilateral areas of atelectasis or infiltrate. Low lung volumes. Electronically Signed   By: Rolm Baptise M.D.   On: 11/12/2018 10:04   [place] Ascites (abdomen Limited)  Result Date: 11/11/2018 CLINICAL DATA:  Abdominal distension EXAM: LIMITED ABDOMEN ULTRASOUND FOR ASCITES TECHNIQUE: Limited  ultrasound survey for ascites was performed in all four abdominal quadrants. COMPARISON:  10/21/2018 FINDINGS: Scanning in the abdomen shows mild to moderate ascites increased when compared with the prior MRI examination. Fluid is noted in all 4 quadrants. IMPRESSION: Mild to moderate ascites increased when compared with the prior MRI. Electronically Signed   By: Inez Catalina M.D.   On: 11/11/2018 12:14   Ct Angio Abd/pel W/ And/or W/o  Result Date: 11/13/2018 CLINICAL DATA:  53 year old female with alcoholic cirrhosis, esophageal varices and upper GI bleeding. Recent endoscopy did not find evidence of recent bleeding from the grade 1 varices. EXAM: CT ANGIOGRAPHY ABDOMEN AND PELVIS WITH CONTRAST AND WITHOUT CONTRAST TECHNIQUE: Multidetector CT imaging of the abdomen and  pelvis was performed using the standard protocol during bolus administration of intravenous contrast. Multiplanar reconstructed images and MIPs were obtained and reviewed to evaluate the vascular anatomy. CONTRAST:  135m OMNIPAQUE IOHEXOL 350 MG/ML SOLN COMPARISON:  MRI abdomen 10/21/2018 FINDINGS: VASCULAR Aorta: Normal in caliber. Minimal atherosclerotic vascular calcifications. Celiac: The celiac axis gives rise to the left gastric and splenic artery. No stenosis, aneurysm or dissection. SMA: Completely replaced common hepatic artery. The origin of SMA is widely patent. Renals: Solitary renal arteries bilaterally. The renal arteries are widely patent without evidence of aneurysm, dissection or FMD. IMA: Patent without evidence of aneurysm, dissection, vasculitis or significant stenosis. Inflow: Patent without evidence of aneurysm, dissection, vasculitis or significant stenosis. Proximal Outflow: Bilateral common femoral and visualized portions of the superficial and profunda femoral arteries are patent without evidence of aneurysm, dissection, vasculitis or significant stenosis. Veins: Severe portal hypertension with atypical ectopic  variceal pattern. There are small esophageal varices arising from the posterior gastric vein. However, the dominant variceal system arises from the superior mesenteric vein, specifically the right colic branch which is highly tortuous and enlarged and travels throughout the right hemiabdomen and through the wall of the ascending colon before joining the systemic system via the right ovarian vein. The right ovarian vein is diffusely enlarged and hypervascular. Where the vein passes through the wall of the ascending colon, there are massive varices. Additionally, there is a small network of varices in the duodenum at the junction of the third and fourth portions which appear to communicate with the ovarian vein as well. No gastric varices visualized. Patent and unremarkable renal veins. The IVC and iliac veins are all patent. Review of the MIP images confirms the above findings. NON-VASCULAR Lower chest: Cardiomegaly. No pericardial effusion. Trace bilateral pleural effusions with associated atelectasis. Hepatobiliary: Diffusely nodular hepatic contour consistent with cirrhosis. No arterially enhancing lesion is evident. No intra or extrahepatic biliary ductal dilatation. Multiple small stones layer dependently within the gallbladder lumen. The common bile duct is normal. Pancreas: Unremarkable. No pancreatic ductal dilatation or surrounding inflammatory changes. Spleen: Marked splenomegaly. Adrenals/Urinary Tract: Normal adrenal glands. The kidneys are unremarkable. No hydronephrosis. Unremarkable ureters. The bladder is decompressed with a Foley catheter in place. Stomach/Bowel: Please see variceal description in the venous section above. There is no evidence of intraluminal hemorrhage at this time. No evidence of bowel obstruction or focal bowel wall thickening. Lymphatic: No suspicious lymphadenopathy. Reproductive: Uterus and bilateral adnexa are unremarkable. Other: Mild to moderate ascites. Of note, high  attenuation material is layering adjacent to the varices in the right pericolic gutter and also more dependently in the pelvis consistent with hemoperitoneum. Musculoskeletal: No acute fracture or aggressive appearing lytic or blastic osseous lesion. Focal L5-S1 degenerative disc disease. IMPRESSION: VASCULAR 1. 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 Additionally, there are small jejunal varices at the junction of the third and fourth portion of the duodenum which also drain via the right gonadal vein. There is evidence of recent intraperitoneal bleeding with layering blood products in the patient's ascites. These very large varices may be at high risk for continued bleeding. 2. The portal veins remain patent. 3. Small esophageal varices consistent with grade 1 varices as described on the recent upper endoscopy. 4.  Aortic Atherosclerosis (ICD10-170.0). 5. 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 NON-VASCULAR 1. Hepatic cirrhosis with portal hypertension including splenomegaly and extensive portosystemic varices as described  above. 2. Mild to moderate ascites with layering blood products consistent with recent hemorrhage. 3. Cholelithiasis. 4. Cardiomegaly. 5. Small bilateral pleural effusions and associated atelectasis. These results were called by telephone at the time of interpretation on 11/13/2018 at 5:40 pm to Dr. Vernard Gambles , who verbally acknowledged these results. Signed, Criselda Peaches, MD, [facility] Electronically Signed   By: Jacqulynn Cadet M.D.   On: 11/13/2018 17:42    Labs:  CBC: Recent Labs    11/13/18 0316 11/13/18 1810 11/13/18 2210 11/14/18 0312  WBC 8.3 7.2 7.4 7.3  HGB 10.1* 10.1* 10.4* 11.1*  HCT 30.4* 31.5* 31.7* 34.0*  PLT 75* 59* 58* 60*    COAGS: Recent Labs     06/08/18 0109  11/11/18 0226 11/12/18 0526 11/13/18 0316 11/14/18 0919  INR 1.5*   < > 2.5* 1.7* 1.6* 1.8*  APTT 41*  --  44*  --   --   --    < > = values in this interval not displayed.    BMP: Recent Labs    11/12/18 0526 11/13/18 0316 11/13/18 1810 11/13/18 2210 11/14/18 0312  NA 139 139  --  137 136  K 4.0 3.2* 3.7 3.7 3.8  CL 112* 108  --  104 105  CO2 22 25  --  23 23  GLUCOSE 131* 120*  --  134* 104*  BUN 11 13  --  6 7  CALCIUM 7.0* 7.7*  --  8.2* 8.1*  CREATININE 0.54 0.41*  --  0.51 0.49  GFRNONAA >60 >60  --  >60 >60  GFRAA >60 >60  --  >60 >60    LIVER FUNCTION TESTS: Recent Labs    11/12/18 0526 11/13/18 0316 11/13/18 2210 11/14/18 0312  BILITOT 4.4* 6.3* 8.8* 9.1*  AST 136* 115* 89* 81*  ALT 41 40 35 35  ALKPHOS 55 64 62 68  PROT 5.3* 6.3* 6.5 6.7  ALBUMIN 2.4* 2.8* 2.8* 2.9*    TUMOR MARKERS: No results for input(s): AFPTM, CEA, CA199, CHROMGRNA in the last 8760 hours.  Assessment and Plan: Hemoperitoneum from suspected abdominal variceal hemorrhage Alcoholic cirrhosis, recurrent ascites  Lisa [facility] is a 53 year old female with history of alcoholic liver cirrhosis, recurrent ascites, grade 1 esophageal varices status post banding in the past, portal hypertension who presented to the [facility] ED 8/19 with hematemesis and hypotensive shock.  She was intubated and treated for very low hemoglobin of 3.8.  She was subsequently found to have bloody ascites.  A CTA abdomen pelvis shows a RLQ varix with blood flow involving the right colic vein, portal vein, right ovarian vein.  Patient was transferred to [facility] for ongoing evaluation and possible IR TIPS procedure.  Dr. Kathlene Cote has reviewed the case and met with patient.  She is now extubated and is able to participate in conversation regarding her health although she is easily distracted.  Upon assessment she does not have any abdominal pain.  Her hemoglobin is currently stable at 11.1. VSS.   Does not appear she is actively bleeding, however remains at high risk for recurrent bleeding.  She would benefit from intervention, however currently showing signs of decompensated cirrhosis. Her MELDNa is 22 which is significantly elevated due to a rising T bili of 9.1.  Review of available history shows a baseline T bili of around 4. SCr 0.49.  Again, patient will likely require intervention for her portosystemic shunt, however careful planning underway due to  her current anatomy/presentation, current decompensated cirrhosis, elevated T bili, elevated meld score, past episodes of encephalopathy. This is all discussed with patient at bedside with Dr. Kathlene Cote.  Dr. Kathlene Cote requests ongoing GI involvement for improvement in bilirubin.  He recommends obtaining an echo. Patient to be consented for possible TIPS procedure, possible, embolization, sclerotherapy of abdominal varices.  Patient lists her mother as her emergency contact.  She may have clear liquids today from IR perspective.  N.p.o. after midnight tonight. INR reordered.   Thank you for this interesting consult.  I greatly enjoyed meeting Lisa [facility] and look forward to participating in their care.  A copy of this report was sent to the requesting provider on this date.  Electronically Signed: Docia Barrier, PA 11/14/2018, 10:59 AM   I spent a total of 20 Mi[place]utes    in face to face in clinical consultation, greater than 50% of which was counseling/coordinating care for abdominal varices.

## 2018-11-14 NOTE — H&P (Signed)
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  Transferred here for emergent TIPS.  History of present illness   Patient is a 53 year old with history of alcohol abuse admitted to [facility] 11/11/2018 massive volume resuscitation initially including 8 units packed RBCs 4 units fresh frozen plasma 2 units platelets and intubated at time of admission due to loss of consciousness.  On EGD performed 11/12/2018 there was no evidence of an upper GI bleed.  Patient did have grade 1 esophageal varices but these were not actively bleeding.  Initial INR was 2.5 total bili of 3.3.  Ultrasound-guided paracentesis by radiology revealed 200 mL of bloody fluid obtained from the left lower quadrant.  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  There was evidence of recent intraperitoneal bleeding with layering blood in the patient's ascites.  Patient is transferred to [facility] for emergent TIPS with retrograde balloon occlusion of the varices. My evaluation the patient looks remarkably good.  She is awake alert oriented.  Vital signs are stable.  She is on octreotide.  Is icteric.  Past Medical History   . Alcoholic intoxication with complication ([place]) [id number]  . Hepatic encephalopathy ([place]) 08/27/2018  . GI bleeding 06/08/2018  . Alcoholic cirrhosis of liver without ascites ([place])      Significant Hospital Events   Transferred to [facility] for emergent  TIPS  Consults:  Gastroenterology radiology and interventional radiology  Procedures:  EGD 8/20 Paracentesis 8/21  Significant Diagnostic Tests:  CT angiography  Micro Data:  NA  Antimicrobials:  NA  Interim history/subjective:  As above  Objective   Blood pressure 138/86, pulse 88, temperature 100.2 F (37.9 C), temperature source Oral, resp. rate 20, SpO2 90 %.        Intake/Output Summary (Last 24 hours) at 11/14/2018 0417 Last data filed at 11/14/2018 0300 Gross per 24 hour  Intake 657.32 ml  Output 290 ml  Net 367.32 ml   There were no vitals filed for this visit.  Examination: General: White female in no distress HENT: Icteric sclera Lungs: Clear distant Cardiovascular: Agonal rate rhythm no murmurs or gallops Abdomen: Soft nontender bowel sounds hypoactive Extremities: Within normal limits no edema Neuro: Awake alert nonfocal GU: N/A  Resolved Hospital Problem list   NA  Assessment & Plan:  1.  Status post resuscitation for hemorrhagic stroke due to intraperitoneal bleed: Await results of TIPS monitor hemoglobin and platelet count electrolytes  2.  Status post massive volume replacement with blood products: As above  3.  Mild nonoliguric acute kidney injury: Monitor ensure adequate volume replacement, monitor I's and O's  4.  Acute encephalopathy requiring ventilator management: As per protocol.  When stable Daily SBT  5.  History of alcohol abuse: Monitor for withdrawal symptoms once more awake  6.  Altered mental status: Reevaluate daily, minimize sedation as possible  Best practice:  Diet: N.p.o. Pain/Anxiety/Delirium protocol (if indicated): Continue with sedation as indicated VAP protocol (if indicated): Yes DVT prophylaxis: No high risk for bleeding  GI prophylaxis: Yes Glucose control: Yes Mobility: Bedrest Code Status: Full Family Communication: Pending Disposition: Transfer to [facility], ICU  Labs   CBC: Recent Labs  Lab  11/11/18 0226  11/12/18 1853 11/13/18 0316 11/13/18 1810 11/13/18 2210 11/14/18 0312  WBC 5.5   < > 8.5 8.3 7.2 7.4 7.3  NEUTROABS 2.4  --  7.2  --  5.2  --   --   HGB 3.8*   < > 10.1* 10.1* 10.1* 10.4* 11.1*  HCT 13.0*   < > 29.8* 30.4* 31.5* 31.7* 34.0*  MCV 102.4*   < > 90.0 91.6 94.6 93.2 93.4  PLT 58*   < > 28* 75* 59* 58* 60*   < > = values in this interval not displayed.    Basic Metabolic Panel: Recent Labs  Lab 11/11/18 0226 11/11/18 0650 11/12/18 0526 11/13/18 0316 11/13/18 1810 11/13/18 2210  NA 141 135 139 139  --  137  K 3.2* 4.3 4.0 3.2* 3.7 3.7  CL 114* 108 112* 108  --  104  CO2 19* 16* 22 25  --  23  GLUCOSE 130* 299* 131* 120*  --  134*  BUN 6 8 11 13   --  6  CREATININE 0.50 0.79 0.54 0.41*  --  0.51  CALCIUM 7.0* 6.6* 7.0* 7.7*  --  8.2*  MG  --   --   --  1.3* 1.7  --   PHOS  --   --   --  1.9* 1.4*  --    GFR: Estimated Creatinine Clearance: 76.7 mL/min (by C-G formula based on SCr of 0.51 mg/dL). Recent Labs  Lab 11/11/18 0320 11/11/18 0650  11/12/18 0526  11/13/18 0316 11/13/18 1810 11/13/18 2210 11/14/18 0312  PROCALCITON  --  <0.10  --  0.18  --   --   --   --   --   WBC  --   --    < > 12.6*   < > 8.3 7.2 7.4 7.3  LATICACIDVEN 7.1* 5.5*  --   --   --   --   --   --   --    < > = values in this interval not displayed.    Liver Function Tests: Recent Labs  Lab 11/11/18 0226 11/12/18 0526 11/13/18 0316 11/13/18 2210  AST 73* 136* 115* 89*  ALT 20 41 40 35  ALKPHOS 46 55 64 62  BILITOT 3.3* 4.4* 6.3* 8.8*  PROT 4.2* 5.3* 6.3* 6.5  ALBUMIN 1.6* 2.4* 2.8* 2.8*   No results for input(s): LIPASE, AMYLASE in the last 168 hours. Recent Labs  Lab 11/11/18 0650 11/13/18 0446  AMMONIA 68* 68*    ABG    Component Value Date/Time   PHART 7.30 (L) 11/11/2018 0845   PCO2ART 35 11/11/2018 0845   PO2ART 84 11/11/2018 0845   HCO3 17.2 (L) 11/11/2018 0845   ACIDBASEDEF 8.4 (H) 11/11/2018 0845   O2SAT 95.1 11/11/2018 0845      Coagulation Profile: Recent Labs  Lab 11/11/18 0226 11/12/18 0526 11/13/18 0316  INR 2.5* 1.7* 1.6*    Cardiac Enzymes: No results for input(s): CKTOTAL, CKMB, CKMBINDEX, TROPONINI in the last 168 hours.  HbA1C: Hgb A1c MFr Bld  Date/Time Value Ref Range Status  08/29/2018 05:39 AM 3.7 (L) 4.8 - 5.6 % Final    Comment:    (NOTE) Pre diabetes:          5.7%-6.4% Diabetes:              >  6.4% Glycemic control for   <7.0% adults with diabetes     CBG: Recent Labs  Lab 11/12/18 0758 11/12/18 1138 11/12/18 1627 11/12/18 2118 11/13/18 0739  GLUCAP 124* 122* 131* 141* 98    Review of Systems:   She has no specific complaints no complaints of abdominal pain or other  Past Medical History  She,  has a past medical history of Alcohol abuse, Anxiety, and Cirrhosis ([place]).   Surgical History    Past Surgical History:  Procedure Laterality Date  . ESOPHAGOGASTRODUODENOSCOPY (EGD) WITH PROPOFOL N/A 06/08/2018   Procedure: ESOPHAGOGASTRODUODENOSCOPY (EGD) WITH PROPOFOL;  Surgeon: Lucilla Lame, MD;  Location: [facility] ENDOSCOPY;  Service: Endoscopy;  Laterality: N/A;  . ESOPHAGOGASTRODUODENOSCOPY (EGD) WITH PROPOFOL N/A 11/12/2018   Procedure: ESOPHAGOGASTRODUODENOSCOPY (EGD) WITH PROPOFOL;  Surgeon: Jonathon Bellows, MD;  Location: [facility] ENDOSCOPY;  Service: Gastroenterology;  Laterality: N/A;     Social History   reports that she has quit smoking. She has never used smokeless tobacco. She reports current alcohol use.   Family History   Her family history is not on file.   Allergies Allergies  Allergen Reactions  . Benadryl [Diphenhydramine] Other (See Comments)    Reaction: makes crazy and can't sleep for days  . Ciprofloxacin Itching  . Latex Hives  . Morphine And Related Hives  . Penicillins Hives and Other (See Comments)    Did it involve swelling of the face/tongue/throat, SOB, or low BP? No Did it involve sudden or severe rash/hives, skin peeling, or any reaction on the  inside of your mouth or nose? Unknown Did you need to seek medical attention at a hospital or doctor's office? Unknown When did it last happen?unknown If all above answers are "NO", may proceed with cephalosporin use.  **patient tolerates ceftriaxone - Aug 2020   . Sulfa Antibiotics Hives  . Tetracyclines & Related Hives     Home Medications  Prior to Admission medications   Medication Sig Start Date End Date Taking? Authorizing Provider  folic acid (FOLVITE) 1 MG tablet Take 1 tablet (1 mg total) by mouth daily. 10/19/18  Yes Henreitta Leber, MD  lactulose (CHRONULAC) 10 GM/15ML solution 20gm oral twice a day (goal is 3 bowel movements a day, can go to three or four times a day dosing if needed) 10/19/18  Yes Sainani, Belia Heman, MD  pantoprazole (PROTONIX) 40 MG tablet Take 1 tablet (40 mg total) by mouth daily. 10/19/18  Yes Sainani, Belia Heman, MD  thiamine 100 MG tablet Take 1 tablet (100 mg total) by mouth daily. 10/19/18  Yes Sainani, Belia Heman, MD  cefTRIAXone 1 g in sodium chloride 0.9 % 100 mL Inject 1 g into the vein daily. 11/14/18   Awilda Bill, NP  lactulose (CHRONULAC) 10 GM/15ML solution Take 45 mLs (30 g total) by mouth 3 (three) times daily. 11/13/18   Awilda Bill, NP  LORazepam (ATIVAN) 1 MG tablet Take 1 tablet (1 mg total) by mouth every 6 (six) hours as needed (CIWA-AR > 8  -OR-  withdrawal symptoms:  anxiety, agitation, insomnia, diaphoresis, nausea, vomiting, tremors, tachycardia, or hypertension.). 11/13/18   Awilda Bill, NP  LORazepam (ATIVAN) 2 MG/ML injection Inject 0.5 mLs (1 mg total) into the vein every 6 (six) hours as needed (CIWA-AR > 8-OR-withdrawal symptoms:anxiety, agitation, insomnia, diaphoresis, nausea, vomiting, tremors tachycardia, or hypertension.). 11/13/18   Awilda Bill, NP  mupirocin ointment (BACTROBAN) 2 % Place 1 application into the nose 2 (two) times daily. 11/13/18  Awilda Bill, NP  octreotide 500 mcg in sodium chloride 0.9 %  250 mL Inject 50 mcg/hr into the vein continuous. 11/13/18   Awilda Bill, NP  ondansetron (ZOFRAN) 4 MG/2ML SOLN injection Inject 2 mLs (4 mg total) into the vein every 6 (six) hours as needed for nausea. 11/13/18   Awilda Bill, NP  pantoprazole (PROTONIX) 40 MG injection Inject 40 mg into the vein every 12 (twelve) hours. 11/13/18   Awilda Bill, NP  phytonadione 10 mg in dextrose 5 % 50 mL Inject 10 mg into the vein daily. 11/14/18   Awilda Bill, NP  sodium chloride 0.9 % SOLN 50 mL with thiamine 100 MG/ML SOLN 500 mg Inject 500 mg into the vein daily. 11/14/18   Awilda Bill, NP  sodium chloride flush (NS) 0.9 % SOLN 10-40 mLs by Intracatheter route every 12 (twelve) hours. 11/13/18   Awilda Bill, NP  sodium chloride flush (NS) 0.9 % SOLN 10-40 mLs by Intracatheter route as needed (flush). 11/13/18   Awilda Bill, NP  thiamine [id number] mg, folic acid 1 mg, multivitamins adult 10 mL in sodium chloride 0.9 % 1,000 mL Inject 100 mL/hr into the vein daily. 11/13/18   Awilda Bill, NP     Critical care time: 35 minutes spent in bedside evaluation chart review and critical care planning.

## 2018-11-14 NOTE — Plan of Care (Signed)

## 2018-11-15 DIAGNOSIS — K7290 Hepatic failure, unspecified without coma: Secondary | ICD-10-CM

## 2018-11-15 DIAGNOSIS — K661 Hemoperitoneum: Secondary | ICD-10-CM | POA: Diagnosis present

## 2018-11-15 LAB — COMPREHENSIVE METABOLIC PANEL
ALT: 33 U/L (ref 0–44)
AST: 73 U/L — ABNORMAL HIGH (ref 15–41)
Albumin: 2.7 g/dL — ABNORMAL LOW (ref 3.5–5.0)
Alkaline Phosphatase: 65 U/L (ref 38–126)
Anion gap: 10 (ref 5–15)
BUN: 7 mg/dL (ref 6–20)
CO2: 22 mmol/L (ref 22–32)
Calcium: 8.3 mg/dL — ABNORMAL LOW (ref 8.9–10.3)
Chloride: 104 mmol/L (ref 98–111)
Creatinine, Ser: 0.56 mg/dL (ref 0.44–1.00)
GFR calc Af Amer: >60 mL/min (ref >60)
GFR calc non Af Amer: >60 mL/min (ref >60)
Glucose, Bld: 108 mg/dL — ABNORMAL HIGH (ref 70–99)
Potassium: 3.8 mmol/L (ref 3.5–5.1)
Sodium: 136 mmol/L (ref 135–145)
Total Bilirubin: 9.5 mg/dL — ABNORMAL HIGH (ref 0.3–1.2)
Total Protein: 6.9 g/dL (ref 6.5–8.1)

## 2018-11-15 LAB — CBC
HCT: 33.7 % — ABNORMAL LOW (ref 36.0–46.0)
Hemoglobin: 11.1 g/dL — ABNORMAL LOW (ref 12.0–15.0)
MCH: 30.7 pg (ref 26.0–34.0)
MCHC: 32.9 g/dL (ref 30.0–36.0)
MCV: 93.4 fL (ref 80.0–100.0)
Platelets: 63 10*3/uL — ABNORMAL LOW (ref 150–400)
RBC: 3.61 MIL/uL — ABNORMAL LOW (ref 3.87–5.11)
RDW: 17.7 % — ABNORMAL HIGH (ref 11.5–15.5)
WBC: 6.8 10*3/uL (ref 4.0–10.5)
nRBC: 0 % (ref 0.0–0.2)

## 2018-11-15 LAB — BILIRUBIN, FRACTIONATED(TOT/DIR/INDIR)
Bilirubin, Direct: 3.7 mg/dL — ABNORMAL HIGH (ref 0.0–0.2)
Indirect Bilirubin: 5.3 mg/dL — ABNORMAL HIGH (ref 0.3–0.9)
Total Bilirubin: 9 mg/dL — ABNORMAL HIGH (ref 0.3–1.2)

## 2018-11-15 LAB — PROTIME-INR
INR: 1.8 — ABNORMAL HIGH (ref 0.8–1.2)
Prothrombin Time: 20.6 seconds — ABNORMAL HIGH (ref 11.4–15.2)

## 2018-11-15 NOTE — Progress Notes (Addendum)
Cross cover [facility]-GI Subjective: Since I last evaluated the patient, she seems to be doing slightly better today.  She is more awake and alert and is responding to questions appropriately.  She denies having any abdominal pain nausea vomiting.   Objective: Vital signs in last 24 hours: Temp:  [99.1 F (37.3 C)-100.7 F (38.2 C)] 99.4 F (37.4 C) (08/23 1154) Pulse Rate:  [70-81] 77 (08/23 1500) Resp:  [11-21] 16 (08/23 1200) BP: (134-170)/(65-95) 146/86 (08/23 1500) SpO2:  [91 %-96 %] 93 % (08/23 1500) Weight:  [66.1 kg] 66.1 kg (08/23 0500) Last BM Date: 11/15/18  Intake/Output from previous day: 08/22 0701 - 08/23 0700 In: 1278.1 [P.O.:120; I.V.:547.9; IV Piggyback:610.2] Out: 790 [Urine:790] Intake/Output this shift: Total I/O In: 589.4 [P.O.:240; I.V.:249.4; IV Piggyback:100] Out: 250 [Urine:250]  General appearance: alert, cooperative, appears stated age, no distress, mildly obese and icteric-there is no evidence of asterixis Resp: clear to auscultation bilaterally Cardio: regular rate and rhythm, S1, S2 normal, no murmur, click, rub or gallop GI: soft, non-tender; bowel sounds normal; no masses,  no organomegaly Extremities: extremities normal, atraumatic, no cyanosis or edema  Lab Results: Recent Labs    11/13/18 2210 11/14/18 0312 11/15/18 0752  WBC 7.4 7.3 6.8  HGB 10.4* 11.1* 11.1*  HCT 31.7* 34.0* 33.7*  PLT 58* 60* 63*   BMET Recent Labs    11/13/18 2210 11/14/18 0312 11/15/18 0246  NA 137 136 136  K 3.7 3.8 3.8  CL 104 105 104  CO2 23 23 22   GLUCOSE 134* 104* 108*  BUN 6 7 7   CREATININE 0.51 0.49 0.56  CALCIUM 8.2* 8.1* 8.3*   LFT Recent Labs    11/15/18 0246 11/15/18 0752  PROT 6.9  --   ALBUMIN 2.7*  --   AST 73*  --   ALT 33  --   ALKPHOS 65  --   BILITOT 9.5* 9.0*  BILIDIR  --  3.7*  IBILI  --  5.3*   PT/INR Recent Labs    11/14/18 0919 11/15/18 0246  LABPROT 20.7* 20.6*  INR 1.8* 1.8*   Studies/Results: US  Paracentesis  Result Date: 11/13/2018 INDICATION: 53 year old with fever. Ascites. Patient initially presented with low hemoglobin. EXAM: ULTRASOUND GUIDED PARACENTESIS MEDICATIONS: None. COMPLICATIONS: None immediate. PROCEDURE: Informed written consent was obtained from the patient after a discussion of the risks, benefits and alternatives to treatment. A timeout was performed prior to the initiation of the procedure. Initial ultrasound scanning demonstrates a large amount of ascites within the left lower abdominal quadrant. The right lower abdomen was prepped and draped in the usual sterile fashion. 1% lidocaine was used for local anesthesia. Following this, a 6 Fr Safe-T-Centesis catheter was introduced. An ultrasound image was saved for documentation purposes. The paracentesis was performed. The catheter was removed and a dressing was applied. The patient tolerated the procedure well without immediate post procedural complication. FINDINGS: A total of approximately 2 L of bloody fluid was removed. Samples were sent to the laboratory as requested by the clinical team. Paracentesis was stopped after 2 L due to the bloody appearance of the fluid. IMPRESSION: Successful ultrasound-guided paracentesis yielding 2 liters of peritoneal fluid. These results were called by telephone at the time of interpretation on 11/13/2018 at 3:22 pm to Dr. Jonathon Bellows , who verbally acknowledged these results. Electronically Signed   By: Markus Daft M.D.   On: 11/13/2018 16:18   Ct Angio Abd/pel W/ And/or W/o  Result Date: 11/13/2018 CLINICAL DATA:  53 year old  female with alcoholic cirrhosis, esophageal varices and upper GI bleeding. Recent endoscopy did not find evidence of recent bleeding from the grade 1 varices. EXAM: CT ANGIOGRAPHY ABDOMEN AND PELVIS WITH CONTRAST AND WITHOUT CONTRAST TECHNIQUE: Multidetector CT imaging of the abdomen and pelvis was performed using the standard protocol during bolus administration of  intravenous contrast. Multiplanar reconstructed images and MIPs were obtained and reviewed to evaluate the vascular anatomy. CONTRAST:  179mL OMNIPAQUE IOHEXOL 350 MG/ML SOLN COMPARISON:  MRI abdomen 10/21/2018 FINDINGS: VASCULAR Aorta: Normal in caliber. Minimal atherosclerotic vascular calcifications. Celiac: The celiac axis gives rise to the left gastric and splenic artery. No stenosis, aneurysm or dissection. SMA: Completely replaced common hepatic artery. The origin of SMA is widely patent. Renals: Solitary renal arteries bilaterally. The renal arteries are widely patent without evidence of aneurysm, dissection or FMD. IMA: Patent without evidence of aneurysm, dissection, vasculitis or significant stenosis. Inflow: Patent without evidence of aneurysm, dissection, vasculitis or significant stenosis. Proximal Outflow: Bilateral common femoral and visualized portions of the superficial and profunda femoral arteries are patent without evidence of aneurysm, dissection, vasculitis or significant stenosis. 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 There are small esophageal varices arising from the posterior gastric vein. However, the dominant variceal system arises from the superior mesenteric vein, specifically the right colic branch which is highly tortuous and enlarged and travels throughout the right hemiabdomen and through the wall of the ascending colon before joining the systemic system via the right ovarian vein. The right ovarian vein is diffusely enlarged and hypervascular. Where the vein passes through the wall of the ascending colon, there are massive varices. Additionally, there is a small network of varices in the duodenum at the junction of the third and fourth portions which appear to communicate with the ovarian vein as well. No gastric varices visualized. Patent and unremarkable renal veins. The IVC and iliac veins are all patent. Review of the MIP images confirms the  above findings. NON-VASCULAR Lower chest: Cardiomegaly. No pericardial effusion. Trace bilateral pleural effusions with associated atelectasis. Hepatobiliary: Diffusely nodular hepatic contour consistent with cirrhosis. No arterially enhancing lesion is evident. No intra or extrahepatic biliary ductal dilatation. Multiple small stones layer dependently within the gallbladder lumen. The common bile duct is normal. Pancreas: Unremarkable. No pancreatic ductal dilatation or surrounding inflammatory changes. Spleen: Marked splenomegaly. Adrenals/Urinary Tract: Normal adrenal glands. The kidneys are unremarkable. No hydronephrosis. Unremarkable ureters. The bladder is decompressed with a Foley catheter in place. Stomach/Bowel: Please see variceal description in the venous section above. There is no evidence of intraluminal hemorrhage at this time. No evidence of bowel obstruction or focal bowel wall thickening. Lymphatic: No suspicious lymphadenopathy. Reproductive: Uterus and bilateral adnexa are unremarkable. Other: Mild to moderate ascites. Of note, high attenuation material is layering adjacent to the varices in the right pericolic gutter and also more dependently in the pelvis consistent with hemoperitoneum. Musculoskeletal: No acute fracture or aggressive appearing lytic or blastic osseous lesion. Focal L5-S1 degenerative disc disease. IMPRESSION: VASCULAR 1. 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 Additionally, there are small jejunal varices at the junction of the third and fourth portion of the duodenum which also drain via the right gonadal vein. There is evidence of recent intraperitoneal bleeding with layering blood products in the patient's ascites. These very large varices may be at high risk for continued bleeding. 2. The portal veins remain patent. 3. Small esophageal  varices consistent with  grade 1 varices as described on the recent upper endoscopy. 4.  Aortic Atherosclerosis (ICD10-170.0). 5. 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 NON-VASCULAR 1. Hepatic cirrhosis with portal hypertension including splenomegaly and extensive portosystemic varices as described above. 2. Mild to moderate ascites with layering blood products consistent with recent hemorrhage. 3. Cholelithiasis. 4. Cardiomegaly. 5. Small bilateral pleural effusions and associated atelectasis. These results were called by telephone at the time of interpretation on 11/13/2018 at 5:40 pm to Dr. Vernard Gambles , who verbally acknowledged these results. Signed, Criselda Peaches, MD, [facility] Radiology Electronically Signed   By: Jacqulynn Cadet M.D.   On: 11/13/2018 17:42   Medications: I have reviewed the patient's current medications.  Assessment/Plan:  1) Alcoholic cirrhosis with MELD of 20, thrombocytopenia platelet count of 63K; severe portal hypertension and ectopic variceal pattern-grade 1 esophageal varices with jejunal varices and massive ascending colon varices noted causing an subperitoneal bleed-noted on paracentesis done yesterday.  I agree with Dr. Margaretmary Dys evaluation I think we should wait till the bilirubin improves a little bit before the TIPS is done as there is a risk of decompensation under the present circumstances.  However if the patient has a recurrent bleed that might be her only option at this time considering the very abnormal pattern of her varices.  It would be beneficial to continue on octreotide for now.  Patient is status post massive volume resuscitation after acute hemorrhagic shock from an intraperitoneal bleed. We will continue octreotide for now 2) Acute encephalopathy-seems to be improving slowly-we will need close monitoring.Marland Kitchen 3) History of alcohol abuse-we will need monitoring for withdrawal; we will need to minimize  sedation. 4) Mild acute kidney injury.    LOS: 2 days   Juanita Craver 11/15/2018, 3:22 PM

## 2018-11-15 NOTE — Progress Notes (Addendum)
Placed call to [facility] transfer line, d/w Dr.Basset with Hepatology, they do not have any ICU or SDU beds at this time to accept pt and do not maintain a wait list ever since Covid -then called [facility] transfer line, they do not have any ICU/SDU beds too, awaiting call back from their Hepatology team  Domenic Polite, MD

## 2018-11-15 NOTE — Progress Notes (Signed)
Referring Physician(s): * No referring provider recorded for this case *  Supervising Physician: Aletta Edouard  Patient Status:  [facility] - In-pt  Chief Complaint: Abdominal varices  Subjective: "You wouldn't be working on me if there were no hope, right?"  Patient resting comfortably in bed this AM.  Able to recall moments of conversation yesterday.  Asks about her bilirubin.   Appears her mental status waxes and wanes.  She is receiving lactulose.   Tbili 9.5 INR 1.8 HgB 11.1  Allergies: Benadryl [diphenhydramine], Ciprofloxacin, Latex, Morphine and related, Penicillins, Sulfa antibiotics, and Tetracyclines & related  Medications: Prior to Admission medications   Medication Sig Start Date End Date Taking? Authorizing Provider  cefTRIAXone 1 g in sodium chloride 0.9 % 100 mL Inject 1 g into the vein daily. 11/14/18   Awilda Bill, NP  lactulose (CHRONULAC) 10 GM/15ML solution Take 45 mLs (30 g total) by mouth 3 (three) times daily. 11/13/18   Awilda Bill, NP  LORazepam (ATIVAN) 1 MG tablet Take 1 tablet (1 mg total) by mouth every 6 (six) hours as needed (CIWA-AR > 8  -OR-  withdrawal symptoms:  anxiety, agitation, insomnia, diaphoresis, nausea, vomiting, tremors, tachycardia, or hypertension.). 11/13/18   Awilda Bill, NP  LORazepam (ATIVAN) 2 MG/ML injection Inject 0.5 mLs (1 mg total) into the vein every 6 (six) hours as needed (CIWA-AR > 8-OR-withdrawal symptoms:anxiety, agitation, insomnia, diaphoresis, nausea, vomiting, tremors tachycardia, or hypertension.). 11/13/18   Awilda Bill, NP  mupirocin ointment (BACTROBAN) 2 % Place 1 application into the nose 2 (two) times daily. 11/13/18   Awilda Bill, NP  octreotide 500 mcg in sodium chloride 0.9 % 250 mL Inject 50 mcg/hr into the vein continuous. 11/13/18   Awilda Bill, NP  ondansetron (ZOFRAN) 4 MG/2ML SOLN injection Inject 2 mLs (4 mg total) into the vein every 6 (six) hours as needed for nausea.  11/13/18   Awilda Bill, NP  pantoprazole (PROTONIX) 40 MG injection Inject 40 mg into the vein every 12 (twelve) hours. 11/13/18   Awilda Bill, NP  phytonadione 10 mg in dextrose 5 % 50 mL Inject 10 mg into the vein daily. 11/14/18   Awilda Bill, NP  sodium chloride 0.9 % SOLN 50 mL with thiamine 100 MG/ML SOLN 500 mg Inject 500 mg into the vein daily. 11/14/18   Awilda Bill, NP  sodium chloride flush (NS) 0.9 % SOLN 10-40 mLs by Intracatheter route every 12 (twelve) hours. 11/13/18   Awilda Bill, NP  sodium chloride flush (NS) 0.9 % SOLN 10-40 mLs by Intracatheter route as needed (flush). 11/13/18   Awilda Bill, NP  thiamine 123XX123 mg, folic acid 1 mg, multivitamins adult 10 mL in sodium chloride 0.9 % 1,000 mL Inject 100 mL/hr into the vein daily. 11/13/18   Awilda Bill, NP     Vital Signs: BP (!) 145/73    Pulse 76    Temp 99.8 F (37.7 C) (Oral)    Resp 14    Ht 5\' 4"  (1.626 m)    Wt 145 lb 11.6 oz (66.1 kg)    SpO2 96%    BMI 25.01 kg/m   Physical Exam  NAD, alert Abdomen: soft, non-tender, non-distended Scleral icterus  Imaging: US Paracentesis  Result Date: 11/13/2018 INDICATION: 53 year old with fever. Ascites. Patient initially presented with low hemoglobin. EXAM: ULTRASOUND GUIDED PARACENTESIS MEDICATIONS: None. COMPLICATIONS: None immediate. PROCEDURE: Informed written consent was obtained from the patient  after a discussion of the risks, benefits and alternatives to treatment. A timeout was performed prior to the initiation of the procedure. Initial ultrasound scanning demonstrates a large amount of ascites within the left lower abdominal quadrant. The right lower abdomen was prepped and draped in the usual sterile fashion. 1% lidocaine was used for local anesthesia. Following this, a 6 Fr Safe-T-Centesis catheter was introduced. An ultrasound image was saved for documentation purposes. The paracentesis was performed. The catheter was removed and a dressing  was applied. The patient tolerated the procedure well without immediate post procedural complication. FINDINGS: A total of approximately 2 L of bloody fluid was removed. Samples were sent to the laboratory as requested by the clinical team. Paracentesis was stopped after 2 L due to the bloody appearance of the fluid. IMPRESSION: Successful ultrasound-guided paracentesis yielding 2 liters of peritoneal fluid. These results were called by telephone at the time of interpretation on 11/13/2018 at 3:22 pm to Dr. Jonathon Bellows , who verbally acknowledged these results. Electronically Signed   By: Markus Daft M.D.   On: 11/13/2018 16:18   Dg Chest Port 1 View  Result Date: 11/12/2018 CLINICAL DATA:  Respiratory failure EXAM: PORTABLE CHEST 1 VIEW COMPARISON:  11/11/2018 FINDINGS: Endotracheal tube and NG tube are unchanged. Low lung volumes. Mild cardiomegaly. Vascular congestion. Patchy bilateral airspace opacities have increased since prior study. IMPRESSION: Cardiomegaly, vascular congestion. Patchy bilateral areas of atelectasis or infiltrate. Low lung volumes. Electronically Signed   By: Rolm Baptise M.D.   On: 11/12/2018 10:04   [place] Ascites (abdomen Limited)  Result Date: 11/11/2018 CLINICAL DATA:  Abdominal distension EXAM: LIMITED ABDOMEN ULTRASOUND FOR ASCITES TECHNIQUE: Limited ultrasound survey for ascites was performed in all four abdominal quadrants. COMPARISON:  10/21/2018 FINDINGS: Scanning in the abdomen shows mild to moderate ascites increased when compared with the prior MRI examination. Fluid is noted in all 4 quadrants. IMPRESSION: Mild to moderate ascites increased when compared with the prior MRI. Electronically Signed   By: Inez Catalina M.D.   On: 11/11/2018 12:14   Ct Angio Abd/pel W/ And/or W/o  Result Date: 11/13/2018 CLINICAL DATA:  53 year old female with alcoholic cirrhosis, esophageal varices and upper GI bleeding. Recent endoscopy did not find evidence of recent bleeding from the grade  1 varices. EXAM: CT ANGIOGRAPHY ABDOMEN AND PELVIS WITH CONTRAST AND WITHOUT CONTRAST TECHNIQUE: Multidetector CT imaging of the abdomen and pelvis was performed using the standard protocol during bolus administration of intravenous contrast. Multiplanar reconstructed images and MIPs were obtained and reviewed to evaluate the vascular anatomy. CONTRAST:  169mL OMNIPAQUE IOHEXOL 350 MG/ML SOLN COMPARISON:  MRI abdomen 10/21/2018 FINDINGS: VASCULAR Aorta: Normal in caliber. Minimal atherosclerotic vascular calcifications. Celiac: The celiac axis gives rise to the left gastric and splenic artery. No stenosis, aneurysm or dissection. SMA: Completely replaced common hepatic artery. The origin of SMA is widely patent. Renals: Solitary renal arteries bilaterally. The renal arteries are widely patent without evidence of aneurysm, dissection or FMD. IMA: Patent without evidence of aneurysm, dissection, vasculitis or significant stenosis. Inflow: Patent without evidence of aneurysm, dissection, vasculitis or significant stenosis. Proximal Outflow: Bilateral common femoral and visualized portions of the superficial and profunda femoral arteries are patent without evidence of aneurysm, dissection, vasculitis or significant stenosis. 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 There are small esophageal varices arising from the posterior gastric vein. However, the dominant variceal system arises from the superior mesenteric vein, specifically the right colic branch which is highly tortuous and enlarged  and travels throughout the right hemiabdomen and through the wall of the ascending colon before joining the systemic system via the right ovarian vein. The right ovarian vein is diffusely enlarged and hypervascular. Where the vein passes through the wall of the ascending colon, there are massive varices. Additionally, there is a small network of varices in the duodenum at the junction of the third and  fourth portions which appear to communicate with the ovarian vein as well. No gastric varices visualized. Patent and unremarkable renal veins. The IVC and iliac veins are all patent. Review of the MIP images confirms the above findings. NON-VASCULAR Lower chest: Cardiomegaly. No pericardial effusion. Trace bilateral pleural effusions with associated atelectasis. Hepatobiliary: Diffusely nodular hepatic contour consistent with cirrhosis. No arterially enhancing lesion is evident. No intra or extrahepatic biliary ductal dilatation. Multiple small stones layer dependently within the gallbladder lumen. The common bile duct is normal. Pancreas: Unremarkable. No pancreatic ductal dilatation or surrounding inflammatory changes. Spleen: Marked splenomegaly. Adrenals/Urinary Tract: Normal adrenal glands. The kidneys are unremarkable. No hydronephrosis. Unremarkable ureters. The bladder is decompressed with a Foley catheter in place. Stomach/Bowel: Please see variceal description in the venous section above. There is no evidence of intraluminal hemorrhage at this time. No evidence of bowel obstruction or focal bowel wall thickening. Lymphatic: No suspicious lymphadenopathy. Reproductive: Uterus and bilateral adnexa are unremarkable. Other: Mild to moderate ascites. Of note, high attenuation material is layering adjacent to the varices in the right pericolic gutter and also more dependently in the pelvis consistent with hemoperitoneum. Musculoskeletal: No acute fracture or aggressive appearing lytic or blastic osseous lesion. Focal L5-S1 degenerative disc disease. IMPRESSION: VASCULAR 1. 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 Additionally, there are small jejunal varices at the junction of the third and fourth portion of the duodenum which also drain via the right gonadal vein. There is evidence of recent  intraperitoneal bleeding with layering blood products in the patient's ascites. These very large varices may be at high risk for continued bleeding. 2. The portal veins remain patent. 3. Small esophageal varices consistent with grade 1 varices as described on the recent upper endoscopy. 4.  Aortic Atherosclerosis (ICD10-170.0). 5. 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 NON-VASCULAR 1. Hepatic cirrhosis with portal hypertension including splenomegaly and extensive portosystemic varices as described above. 2. Mild to moderate ascites with layering blood products consistent with recent hemorrhage. 3. Cholelithiasis. 4. Cardiomegaly. 5. Small bilateral pleural effusions and associated atelectasis. These results were called by telephone at the time of interpretation on 11/13/2018 at 5:40 pm to Dr. Vernard Gambles , who verbally acknowledged these results. Signed, Criselda Peaches, MD, [facility] Electronically Signed   By: Jacqulynn Cadet M.D.   On: 11/13/2018 17:42    Labs:  CBC: Recent Labs    11/13/18 1810 11/13/18 2210 11/14/18 0312 11/15/18 0752  WBC 7.2 7.4 7.3 6.8  HGB 10.1* 10.4* 11.1* 11.1*  HCT 31.5* 31.7* 34.0* 33.7*  PLT 59* 58* 60* 63*    COAGS: Recent Labs    06/08/18 0109  11/11/18 0226 11/12/18 0526 11/13/18 0316 11/14/18 0919 11/15/18 0246  INR 1.5*   < > 2.5* 1.7* 1.6* 1.8* 1.8*  APTT 41*  --  44*  --   --   --   --    < > = values in this interval not displayed.  BMP: Recent Labs    11/13/18 0316 11/13/18 1810 11/13/18 2210 11/14/18 0312 11/15/18 0246  NA 139  --  137 136 136  K 3.2* 3.7 3.7 3.8 3.8  CL 108  --  104 105 104  CO2 25  --  23 23 22   GLUCOSE 120*  --  134* 104* 108*  BUN 13  --  6 7 7   CALCIUM 7.7*  --  8.2* 8.1* 8.3*  CREATININE 0.41*  --  0.51 0.49 0.56  GFRNONAA >60  --  >60 >60 >60  GFRAA >60  --  >60 >60 >60    LIVER FUNCTION TESTS: Recent Labs     11/13/18 0316 11/13/18 2210 11/14/18 0312 11/15/18 0246 11/15/18 0752  BILITOT 6.3* 8.8* 9.1* 9.5* 9.0*  AST 115* 89* 81* 73*  --   ALT 40 35 35 33  --   ALKPHOS 64 62 68 65  --   PROT 6.3* 6.5 6.7 6.9  --   ALBUMIN 2.8* 2.8* 2.9* 2.7*  --     Assessment and Plan: Hemoperitoneum from suspected abdominal variceal hemorrhage Alcoholic cirrhosis, recurrent ascites Patient stable overnight.  No further signs of bleeding.  Her HgB is stable this AM at 11.1 Unfortunately, her Tbili continues to rise and her INR is evelated today.  MELDNa 23 Platelets 63  Patient assessed by GI yesterday who agree that TIPS now would carry increased risk of further decompensation.  Continue to monitor for improvement in liver function or for signs of acute bleeding.  Clear liquids ok for today.   Electronically Signed: Docia Barrier, PA 11/15/2018, 10:03 AM   I spent a total of 15 Minutes at the the patient's bedside AND on the patient's hospital floor or unit, greater than 50% of which was counseling/coordinating care for abdominal varices.

## 2018-11-15 NOTE — Plan of Care (Signed)

## 2018-11-15 NOTE — Progress Notes (Addendum)
PROGRESS NOTE    Lisa Dennis  [id number] DOB: 04-Dec-1965 DOA: 11/13/2018 PCP: Pccm, Md, MD  PCCM transferred to [facility] 8/23 Brief Narrative: 53 year old female with history of alcohol abuse and alcoholic liver cirrhosis, was admitted to [facility] on 8/19 with loss of consciousness and hemorrhagic shock, she received 8 units of PRBC, 4 units of FFP and 2 units of platelets, subsequently had EGD which showed grade 1 varices but no evidence of fresh blood in the stomach.   She underwent a paracentesis which revealed hemorrhagic peritoneal fluid.  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 These very large varices felt to be high risk for continued bleeding. -Subsequently hemoglobin has remained stable she was transferred to ICU at [facility] for IR evaluation for TIPS, seen by GI as well, transferred from PCCM to [facility] service today  Assessment & Plan:   Hemorrhagic shock -Due to intraperitoneal bleed from massive portosystemic shunt, intraperitoneal ascending colonic varices -Status post 8 units of PRBC, 4 units of FFP, 2 units of platelets -Hemoglobin is finally stable however continues to be at high risk for continued bleeding from these very large varices, continue IV octreotide -IR and GI following -Plan for evaluation for TIPS once bilirubin is better -Awaiting stepdown bed, monitor hemoglobin and blood pressure closely  Alcoholic cirrhosis/decompensated -Diuretics on hold -Meld score is 22  Hepatic encephalopathy -Mentation has improved, no asterixis today, continue lactulose  History of alcohol abuse -No evidence of withdrawal, reportedly stopped drinking about a month ago  Chronic thrombocytopenia -Stable  DVT prophylaxis: SCDs Code Status: Full code Family Communication: No family at bedside Disposition Plan:  ICU/stepdown  Consultants:   IR  Gastroenterology   Procedures:   Antimicrobials:    Subjective:   Objective: Vitals:   11/15/18 0700 11/15/18 0800 11/15/18 0900 11/15/18 1000  BP: (!) 141/65 134/76 (!) 145/73 (!) 145/78  Pulse: 75 74 76 71  Resp: 15 14 14    Temp:  99.8 F (37.7 C)    TempSrc:  Oral    SpO2: 94% 93% 96% 94%  Weight:      Height:        Intake/Output Summary (Last 24 hours) at 11/15/2018 1054 Last data filed at 11/15/2018 1000 Gross per 24 hour  Intake 1267.72 ml  Output 680 ml  Net 587.72 ml   Filed Weights   11/14/18 0500 11/15/18 0500  Weight: 65.3 kg 66.1 kg    Examination:  General exam: Alert awake oriented x3, sitting up in bed, no distress Respiratory system: Diminished breath sounds at bases, otherwise clear Cardiovascular system: S1 & S2 heard, RRR. Gastrointestinal system: Soft, nontender, positive fluid thrill, bowel sounds present Central nervous system: Alert and oriented. No focal neurological deficits.  No asterixis Extremities: No edema Skin: No rashes, lesions or ulcers Psychiatry:  Mood & affect appropriate.     Data Reviewed:   CBC: Recent Labs  Lab 11/11/18 0226  11/12/18 1853 11/13/18 0316 11/13/18 1810 11/13/18 2210 11/14/18 0312 11/15/18 0752  WBC 5.5   < > 8.5 8.3 7.2 7.4 7.3 6.8  NEUTROABS 2.4  --  7.2  --  5.2  --   --   --   HGB 3.8*   < > 10.1* 10.1* 10.1* 10.4* 11.1* 11.1*  HCT 13.0*   < > 29.8* 30.4* 31.5* 31.7* 34.0* 33.7*  MCV 102.4*   < >  90.0 91.6 94.6 93.2 93.4 93.4  PLT 58*   < > 28* 75* 59* 58* 60* 63*   < > = values in this interval not displayed.   Basic Metabolic Panel: Recent Labs  Lab 11/12/18 0526 11/13/18 0316 11/13/18 1810 11/13/18 2210 11/14/18 0312 11/15/18 0246  NA 139 139  --  137 136 136  K 4.0 3.2* 3.7 3.7 3.8 3.8  CL 112* 108  --  104 105 104  CO2 22 25  --  23 23 22   GLUCOSE 131* 120*  --  134* 104* 108*  BUN 11 13  --  6 7 7   CREATININE 0.54 0.41*  --  0.51  0.49 0.56  CALCIUM 7.0* 7.7*  --  8.2* 8.1* 8.3*  MG  --  1.3* 1.7  --  1.7  --   PHOS  --  1.9* 1.4*  --  1.8*  --    GFR: Estimated Creatinine Clearance: 77 mL/min (by C-G formula based on SCr of 0.56 mg/dL). Liver Function Tests: Recent Labs  Lab 11/12/18 0526 11/13/18 0316 11/13/18 2210 11/14/18 0312 11/15/18 0246 11/15/18 0752  AST 136* 115* 89* 81* 73*  --   ALT 41 40 35 35 33  --   ALKPHOS 55 64 62 68 65  --   BILITOT 4.4* 6.3* 8.8* 9.1* 9.5* 9.0*  PROT 5.3* 6.3* 6.5 6.7 6.9  --   ALBUMIN 2.4* 2.8* 2.8* 2.9* 2.7*  --    No results for input(s): LIPASE, AMYLASE in the last 168 hours. Recent Labs  Lab 11/11/18 0650 11/13/18 0446  AMMONIA 68* 68*   Coagulation Profile: Recent Labs  Lab 11/11/18 0226 11/12/18 0526 11/13/18 0316 11/14/18 0919 11/15/18 0246  INR 2.5* 1.7* 1.6* 1.8* 1.8*   Cardiac Enzymes: No results for input(s): CKTOTAL, CKMB, CKMBINDEX, TROPONINI in the last 168 hours. BNP (last 3 results) No results for input(s): PROBNP in the last 8760 hours. HbA1C: No results for input(s): HGBA1C in the last 72 hours. CBG: Recent Labs  Lab 11/12/18 0758 11/12/18 1138 11/12/18 1627 11/12/18 2118 11/13/18 0739  GLUCAP 124* 122* 131* 141* 98   Lipid Profile: No results for input(s): CHOL, HDL, LDLCALC, TRIG, CHOLHDL, LDLDIRECT in the last 72 hours. Thyroid Function Tests: No results for input(s): TSH, T4TOTAL, FREET4, T3FREE, THYROIDAB in the last 72 hours. Anemia Panel: No results for input(s): VITAMINB12, FOLATE, FERRITIN, TIBC, IRON, RETICCTPCT in the last 72 hours. Urine analysis:    Component Value Date/Time   COLORURINE AMBER (A) 11/11/2018 0535   APPEARANCEUR CLOUDY (A) 11/11/2018 0535   LABSPEC 1.014 11/11/2018 0535   PHURINE 7.0 11/11/2018 0535   GLUCOSEU 150 (A) 11/11/2018 0535   HGBUR NEGATIVE 11/11/2018 0535   BILIRUBINUR NEGATIVE 11/11/2018 0535   KETONESUR NEGATIVE 11/11/2018 0535   PROTEINUR 100 (A) 11/11/2018 0535   NITRITE  NEGATIVE 11/11/2018 0535   LEUKOCYTESUR NEGATIVE 11/11/2018 0535   Sepsis Labs: @LABRCNTIP (procalcitonin:4,lacticidven:4)  ) Recent Results (from the past 240 hour(s))  SARS Coronavirus 2 [facility] order, Performed in [facility] hospital lab) Nasopharyngeal Nasopharyngeal Swab     Status: None   Collection Time: 11/11/18  2:26 AM   Specimen: Nasopharyngeal Swab  Result Value Ref Range Status   SARS Coronavirus 2 NEGATIVE NEGATIVE Final    Comment: (NOTE) If result is NEGATIVE SARS-CoV-2 target nucleic acids are NOT DETECTED. The SARS-CoV-2 RNA is generally detectable in upper and lower  respiratory specimens during the acute phase of infection. The lowest  concentration of  SARS-CoV-2 viral copies this assay can detect is 250  copies / mL. A negative result does not preclude SARS-CoV-2 infection  and should not be used as the sole basis for treatment or other  patient management decisions.  A negative result may occur with  improper specimen collection / handling, submission of specimen other  than nasopharyngeal swab, presence of viral mutation(s) within the  areas targeted by this assay, and inadequate number of viral copies  (<250 copies / mL). A negative result must be combined with clinical  observations, patient history, and epidemiological information. If result is POSITIVE SARS-CoV-2 target nucleic acids are DETECTED. The SARS-CoV-2 RNA is generally detectable in upper and lower  respiratory specimens dur ing the acute phase of infection.  Positive  results are indicative of active infection with SARS-CoV-2.  Clinical  correlation with patient history and other diagnostic information is  necessary to determine patient infection status.  Positive results do  not rule out bacterial infection or co-infection with other viruses. If result is PRESUMPTIVE POSTIVE SARS-CoV-2 nucleic acids MAY BE PRESENT.   A presumptive positive result was obtained on the submitted specimen   and confirmed on repeat testing.  While 2019 novel coronavirus  (SARS-CoV-2) nucleic acids may be present in the submitted sample  additional confirmatory testing may be necessary for epidemiological  and / or clinical management purposes  to differentiate between  SARS-CoV-2 and other Sarbecovirus currently known to infect humans.  If clinically indicated additional testing with an alternate test  methodology [phone]) is advised. The SARS-CoV-2 RNA is generally  detectable in upper and lower respiratory sp ecimens during the acute  phase of infection. The expected result is Negative. Fact Sheet for Patients:  StrictlyIdeas.no Fact Sheet for Healthcare Providers: BankingDealers.co.za This test is not yet approved or cleared by the [place] FDA and has been authorized for detection and/or diagnosis of SARS-CoV-2 by FDA under an Emergency Use Authorization (EUA).  This EUA will remain in effect (meaning this test can be used) for the duration of the COVID-19 declaration under Section 564(b)(1) of the Act, 21 U.S.C. section 360bbb-3(b)(1), unless the authorization is terminated or revoked sooner. Performed at [facility], [address] [place], [place] [address]   Blood culture (routine x 2)     Status: None (Preliminary result)   Collection Time: 11/11/18  2:26 AM   Specimen: BLOOD  Result Value Ref Range Status   Specimen Description BLOOD ARTHROGRAPHIS SPECIES  Final   Special Requests   Final    BOTTLES DRAWN AEROBIC AND ANAEROBIC Blood Culture results may not be optimal due to an excessive volume of blood received in culture bottles   Culture   Final    NO GROWTH 4 DAYS Performed at [facility], [address], [place], [place] [address]    Report Status PENDING  Incomplete  Urine Culture     Status: None   Collection Time: 11/11/18  5:35 AM   Specimen: Urine, Random  Result Value Ref Range Status    Specimen Description   Final    URINE, RANDOM Performed at [facility], [address] [place], [place] [address]    Special Requests   Final    Normal Performed at [facility], [address], [place], [place] [address]    Culture   Final    NO GROWTH Performed at [facility], [place] [address] [place], [place] [address]    Report Status 11/12/2018 FINAL  Final  MRSA PCR Screening     Status: Abnormal   Collection Time: 11/11/18  6:02 AM   Specimen: Nasal Mucosa; Nasopharyngeal  Result Value Ref Range Status   MRSA by PCR POSITIVE (A) NEGATIVE Final    Comment:        The GeneXpert MRSA Assay (FDA approved for NASAL specimens only), is one component of a comprehensive MRSA colonization surveillance program. It is not intended to diagnose MRSA infection nor to guide or monitor treatment for MRSA infections. RESULT CALLED TO, READ BACK BY AND VERIFIED WITH: Casey Burkitt AT [id number] ON 11/11/2018 JJB Performed at [facility], [address] [place], [place] [address]   Blood culture (routine x 2)     Status: None (Preliminary result)   Collection Time: 11/11/18  6:51 AM   Specimen: BLOOD  Result Value Ref Range Status   Specimen Description BLOOD LINE  Final   Special Requests   Final    BOTTLES DRAWN AEROBIC AND ANAEROBIC Blood Culture results may not be optimal due to an excessive volume of blood received in culture bottles   Culture   Final    NO GROWTH 4 DAYS Performed at [facility], [address], [place], [place] [address]    Report Status PENDING  Incomplete  Culture, respiratory     Status: None   Collection Time: 11/12/18 10:38 AM   Specimen: Tracheal Aspirate; Respiratory  Result Value Ref Range Status   Specimen Description   Final    TRACHEAL ASPIRATE Performed at [facility], [address], [place], [place] [address]    Special Requests   Final    NONE Performed at [facility], [address] [place], [place] [address]    Gram Stain   Final    MODERATE WBC PRESENT,BOTH PMN AND MONONUCLEAR NO ORGANISMS SEEN Performed at [facility], [place] [address], [place], [place] [address]    Culture FEW METHICILLIN RESISTANT STAPHYLOCOCCUS AUREUS  Final   Report Status 11/14/2018 FINAL  Final   Organism ID, Bacteria METHICILLIN RESISTANT STAPHYLOCOCCUS AUREUS  Final      Susceptibility   Methicillin resistant staphylococcus aureus - MIC*    CIPROFLOXACIN >=8 RESISTANT Resistant     ERYTHROMYCIN >=8 RESISTANT Resistant     GENTAMICIN <=0.5 SENSITIVE Sensitive     OXACILLIN >=4 RESISTANT Resistant     TETRACYCLINE <=1 SENSITIVE Sensitive     VANCOMYCIN <=0.5 SENSITIVE Sensitive     TRIMETH/SULFA 160 RESISTANT Resistant     CLINDAMYCIN <=0.25 SENSITIVE Sensitive     RIFAMPIN <=0.5 SENSITIVE Sensitive     Inducible Clindamycin NEGATIVE Sensitive     * FEW METHICILLIN RESISTANT STAPHYLOCOCCUS AUREUS  Body fluid culture     Status: None (Preliminary result)   Collection Time: 11/13/18  3:29 PM   Specimen: [facility] Cytology Peritoneal fluid  Result Value Ref Range Status   Specimen Description   Final    PERITONEAL FLUID Performed at [facility], [address] [place], [place] [address]    Special Requests   Final    NONE Performed at [facility], [place], [place] [address]    Gram Stain   Final    MODERATE WBC PRESENT,BOTH PMN AND MONONUCLEAR NO ORGANISMS SEEN    Culture   Final    NO GROWTH 2 DAYS Performed at [facility], [place] [address], [place],  [address]    Report Status  PENDING  Incomplete         Radiology Studies: US Paracentesis  Result Date: 11/13/2018 INDICATION: 53 year old with fever. Ascites. Patient initially presented with low hemoglobin. EXAM: ULTRASOUND GUIDED PARACENTESIS MEDICATIONS: None. COMPLICATIONS: None immediate. PROCEDURE: Informed written consent was obtained from the patient  after a discussion of the risks, benefits and alternatives to treatment. A timeout was performed prior to the initiation of the procedure. Initial ultrasound scanning demonstrates a large amount of ascites within the left lower abdominal quadrant. The right lower abdomen was prepped and draped in the usual sterile fashion. 1% lidocaine was used for local anesthesia. Following this, a 6 Fr Safe-T-Centesis catheter was introduced. An ultrasound image was saved for documentation purposes. The paracentesis was performed. The catheter was removed and a dressing was applied. The patient tolerated the procedure well without immediate post procedural complication. FINDINGS: A total of approximately 2 L of bloody fluid was removed. Samples were sent to the laboratory as requested by the clinical team. Paracentesis was stopped after 2 L due to the bloody appearance of the fluid. IMPRESSION: Successful ultrasound-guided paracentesis yielding 2 liters of peritoneal fluid. These results were called by telephone at the time of interpretation on 11/13/2018 at 3:22 pm to Dr. Jonathon Bellows , who verbally acknowledged these results. Electronically Signed   By: Markus Daft M.D.   On: 11/13/2018 16:18   Ct Angio Abd/pel W/ And/or W/o  Result Date: 11/13/2018 CLINICAL DATA:  53 year old female with alcoholic cirrhosis, esophageal varices and upper GI bleeding. Recent endoscopy did not find evidence of recent bleeding from the grade 1 varices. EXAM: CT ANGIOGRAPHY ABDOMEN AND PELVIS WITH CONTRAST AND WITHOUT CONTRAST TECHNIQUE: Multidetector CT imaging of the abdomen and pelvis was performed using the standard protocol during bolus administration of intravenous contrast. Multiplanar reconstructed images and MIPs were obtained and reviewed to evaluate the vascular anatomy. CONTRAST:  110mL OMNIPAQUE IOHEXOL 350 MG/ML SOLN COMPARISON:  MRI abdomen 10/21/2018 FINDINGS: VASCULAR Aorta: Normal in caliber. Minimal atherosclerotic vascular  calcifications. Celiac: The celiac axis gives rise to the left gastric and splenic artery. No stenosis, aneurysm or dissection. SMA: Completely replaced common hepatic artery. The origin of SMA is widely patent. Renals: Solitary renal arteries bilaterally. The renal arteries are widely patent without evidence of aneurysm, dissection or FMD. IMA: Patent without evidence of aneurysm, dissection, vasculitis or significant stenosis. Inflow: Patent without evidence of aneurysm, dissection, vasculitis or significant stenosis. Proximal Outflow: Bilateral common femoral and visualized portions of the superficial and profunda femoral arteries are patent without evidence of aneurysm, dissection, vasculitis or significant stenosis. 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 There are small esophageal varices arising from the posterior gastric vein. However, the dominant variceal system arises from the superior mesenteric vein, specifically the right colic branch which is highly tortuous and enlarged and travels throughout the right hemiabdomen and through the wall of the ascending colon before joining the systemic system via the right ovarian vein. The right ovarian vein is diffusely enlarged and hypervascular. Where the vein passes through the wall of the ascending colon, there are massive varices. Additionally, there is a small network of varices in the duodenum at the junction of the third and fourth portions which appear to communicate with the ovarian vein as well. No gastric varices visualized. Patent and unremarkable renal veins. The IVC and iliac veins are all patent. Review of the MIP images confirms the above findings. NON-VASCULAR Lower chest: Cardiomegaly. No pericardial effusion. Trace bilateral pleural effusions with  associated atelectasis. Hepatobiliary: Diffusely nodular hepatic contour consistent with cirrhosis. No arterially enhancing lesion is evident. No intra or extrahepatic  biliary ductal dilatation. Multiple small stones layer dependently within the gallbladder lumen. The common bile duct is normal. Pancreas: Unremarkable. No pancreatic ductal dilatation or surrounding inflammatory changes. Spleen: Marked splenomegaly. Adrenals/Urinary Tract: Normal adrenal glands. The kidneys are unremarkable. No hydronephrosis. Unremarkable ureters. The bladder is decompressed with a Foley catheter in place. Stomach/Bowel: Please see variceal description in the venous section above. There is no evidence of intraluminal hemorrhage at this time. No evidence of bowel obstruction or focal bowel wall thickening. Lymphatic: No suspicious lymphadenopathy. Reproductive: Uterus and bilateral adnexa are unremarkable. Other: Mild to moderate ascites. Of note, high attenuation material is layering adjacent to the varices in the right pericolic gutter and also more dependently in the pelvis consistent with hemoperitoneum. Musculoskeletal: No acute fracture or aggressive appearing lytic or blastic osseous lesion. Focal L5-S1 degenerative disc disease. IMPRESSION: VASCULAR 1. 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 Additionally, there are small jejunal varices at the junction of the third and fourth portion of the duodenum which also drain via the right gonadal vein. There is evidence of recent intraperitoneal bleeding with layering blood products in the patient's ascites. These very large varices may be at high risk for continued bleeding. 2. The portal veins remain patent. 3. Small esophageal varices consistent with grade 1 varices as described on the recent upper endoscopy. 4.  Aortic Atherosclerosis (ICD10-170.0). 5. 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 NON-VASCULAR 1. Hepatic cirrhosis with portal hypertension including splenomegaly and extensive portosystemic varices as described  above. 2. Mild to moderate ascites with layering blood products consistent with recent hemorrhage. 3. Cholelithiasis. 4. Cardiomegaly. 5. Small bilateral pleural effusions and associated atelectasis. These results were called by telephone at the time of interpretation on 11/13/2018 at 5:40 pm to Dr. Vernard Gambles , who verbally acknowledged these results. Signed, Criselda Peaches, MD, [facility] Radiology Electronically Signed   By: Jacqulynn Cadet M.D.   On: 11/13/2018 17:42        Scheduled Meds: . Chlorhexidine Gluconate Cloth  6 each Topical Q0600  . folic acid  1 mg Oral Daily  . lactulose  30 g Oral TID  . mouth rinse  15 mL Mouth Rinse BID  . multivitamin with minerals  1 tablet Oral Daily  . mupirocin ointment   Nasal BID  . pantoprazole (PROTONIX) IV  40 mg Intravenous Q12H  . thiamine  100 mg Oral Daily   Continuous Infusions: . cefTRIAXone (ROCEPHIN)  IV Stopped (11/15/18 0947)  . octreotide  (SANDOSTATIN)    IV infusion 50 mcg/hr (11/15/18 1000)     LOS: 2 days    Time spent: 80min    Domenic Polite, MD [facility]  11/15/2018, 10:54 AM

## 2018-11-16 DIAGNOSIS — K661 Hemoperitoneum: Secondary | ICD-10-CM

## 2018-11-16 DIAGNOSIS — I868 Varicose veins of other specified sites: Principal | ICD-10-CM

## 2018-11-16 LAB — CBC
HCT: 37.5 % (ref 36.0–46.0)
Hemoglobin: 12.5 g/dL (ref 12.0–15.0)
MCH: 31.2 pg (ref 26.0–34.0)
MCHC: 33.3 g/dL (ref 30.0–36.0)
MCV: 93.5 fL (ref 80.0–100.0)
Platelets: 60 10*3/uL — ABNORMAL LOW (ref 150–400)
RBC: 4.01 MIL/uL (ref 3.87–5.11)
RDW: 18.1 % — ABNORMAL HIGH (ref 11.5–15.5)
WBC: 7.3 10*3/uL (ref 4.0–10.5)
nRBC: 0 % (ref 0.0–0.2)

## 2018-11-16 LAB — COMPREHENSIVE METABOLIC PANEL
ALT: 26 U/L (ref 0–44)
AST: 62 U/L — ABNORMAL HIGH (ref 15–41)
Albumin: 2.3 g/dL — ABNORMAL LOW (ref 3.5–5.0)
Alkaline Phosphatase: 64 U/L (ref 38–126)
Anion gap: 10 (ref 5–15)
BUN: 5 mg/dL — ABNORMAL LOW (ref 6–20)
CO2: 23 mmol/L (ref 22–32)
Calcium: 8.4 mg/dL — ABNORMAL LOW (ref 8.9–10.3)
Chloride: 103 mmol/L (ref 98–111)
Creatinine, Ser: 0.52 mg/dL (ref 0.44–1.00)
GFR calc Af Amer: >60 mL/min (ref >60)
GFR calc non Af Amer: >60 mL/min (ref >60)
Glucose, Bld: 129 mg/dL — ABNORMAL HIGH (ref 70–99)
Potassium: 3.3 mmol/L — ABNORMAL LOW (ref 3.5–5.1)
Sodium: 136 mmol/L (ref 135–145)
Total Bilirubin: 8.8 mg/dL — ABNORMAL HIGH (ref 0.3–1.2)
Total Protein: 6 g/dL — ABNORMAL LOW (ref 6.5–8.1)

## 2018-11-16 LAB — CULTURE, BLOOD (ROUTINE X 2)
Culture: NO GROWTH
Culture: NO GROWTH

## 2018-11-16 LAB — MISC LABCORP TEST (SEND OUT): LabCorp test name: 827969

## 2018-11-16 MED ORDER — RIFAXIMIN 550 MG PO TABS
550.0000 mg | ORAL_TABLET | Freq: Two times a day (BID) | ORAL | Status: DC
  Administered 2018-11-16 – 2018-11-23 (×14): 550 mg via ORAL
  Filled 2018-11-16 (×16): qty 1

## 2018-11-16 MED ORDER — LACTULOSE 10 GM/15ML PO SOLN: 20.0000 g | Freq: Two times a day (BID) | ORAL | Status: DC

## 2018-11-16 MED ORDER — POTASSIUM CHLORIDE CRYS ER 20 MEQ PO TBCR
40.0000 meq | EXTENDED_RELEASE_TABLET | Freq: Once | ORAL | Status: AC
  Administered 2018-11-16: 40 meq via ORAL
  Filled 2018-11-16: qty 2

## 2018-11-16 MED ORDER — SODIUM CHLORIDE 0.9 % IV SOLN
INTRAVENOUS | Status: DC | PRN
  Administered 2018-11-16: 1000 mL via INTRAVENOUS

## 2018-11-16 MED ORDER — LORAZEPAM 0.5 MG PO TABS
0.5000 mg | ORAL_TABLET | Freq: Once | ORAL | Status: AC
  Administered 2018-11-16: 0.5 mg via ORAL
  Filled 2018-11-16: qty 1

## 2018-11-16 MED ORDER — CHLORHEXIDINE GLUCONATE CLOTH 2 % EX PADS
6.0000 | MEDICATED_PAD | Freq: Every day | CUTANEOUS | Status: DC
  Administered 2018-11-16 – 2018-11-21 (×5): 6 via TOPICAL

## 2018-11-16 MED ORDER — CHLORHEXIDINE GLUCONATE CLOTH 2 % EX PADS: 6.0000 | MEDICATED_PAD | Freq: Every day | CUTANEOUS | Status: DC

## 2018-11-16 MED ORDER — ENSURE ENLIVE PO LIQD
237.0000 mL | Freq: Two times a day (BID) | ORAL | Status: DC
  Administered 2018-11-17 – 2018-11-23 (×9): 237 mL via ORAL

## 2018-11-16 NOTE — Progress Notes (Signed)
PROGRESS NOTE    Lisa Dennis  [id number] DOB: [birth year] DOA: 11/13/2018 PCP: Pccm, Md, MD  PCCM transferred to [facility] 8/23 Brief Narrative: 53 year old female with history of alcohol abuse and alcoholic liver cirrhosis, was admitted to [facility] on 8/19 with loss of consciousness and hemorrhagic shock, hemoglobin of 3.8, she received 8 units of PRBC, 4 units of FFP and 2 units of platelets, subsequently had EGD which showed grade 1 varices but no evidence of fresh blood in the stomach.   She underwent a paracentesis which revealed hemorrhagic peritoneal fluid.  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 These very large varices felt to be high risk for continued bleeding. -Subsequently hemoglobin has remained stable she was transferred to ICU at [facility] for IR evaluation for TIPS, seen by GI as well, transferred from PCCM to [facility] 8/23 -I called [facility] and [facility] 8/23, no beds at either place, on wait list for [facility] transfer  Assessment & Plan:   Hemorrhagic shock -Due to intraperitoneal bleed from massive portosystemic shunt, intraperitoneal ascending colonic varices -Admitted with shock, hemoglobin of 3.8 to [facility] -Status post 8 units of PRBC, 4 units of FFP, 2 units of platelets -Ultrasound paracentesis 8/21 drained 2 L of blood, -CTA of the abdomen which showed 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 These very large varices felt to be high risk for continued bleeding -Hemoglobin now stable however continues to be at high risk for continued bleeding from these very large varices, continue IV octreotide -IR and GI following -TIPS felt to be very high risk in the setting of decompensated cirrhosis, bilirubin of 9  -IR and GI recommended transfer to tertiary center, I called [facility] 8/23( d/w Dr.Bassett-Hepatology no beds, declined they do not have a waiting list since [place]), called [place] (d/w [place] -Hepatology and Dr.Chandiramani/hospital Medicine) -no beds but on wait list, awaiting stepdown bed -Blood pressure and hemoglobin remained stable  Alcoholic cirrhosis/decompensated -Diuretics on hold -Meld score is 22  Hepatic encephalopathy -Mentation has improved, she is oriented x3, no asterixis , continue lactulose  History of alcohol abuse -No evidence of withdrawal, reportedly stopped drinking about a month ago  Chronic thrombocytopenia -Stable  DVT prophylaxis: SCDs Code Status: Full code Family Communication: No family at bedside Disposition Plan: Stepdown at [facility] when bed available  Consultants:   IR  Gastroenterology   Procedures:   Antimicrobials:    Subjective: -Feels okay, no events overnight, some loose stools from lactulose reported  Objective: Vitals:   11/16/18 0700 11/16/18 0800 11/16/18 1000 11/16/18 1124  BP: (!) 164/81 (!) 154/82 (!) 147/85   Pulse: 71 71 68   Resp:  15    Temp:  99.4 F (37.4 C)  99.7 F (37.6 C)  TempSrc:  Oral  Oral  SpO2: 96% 94% 94%   Weight:      Height:        Intake/Output Summary (Last 24 hours) at 11/16/2018 1205 Last data filed at 11/16/2018 1100 Gross per 24 hour  Intake 640.94 ml  Output 200 ml  Net 440.94 ml   Filed Weights   11/14/18 0500 11/15/18 0500 11/16/18 0401  Weight: 65.3 kg 66.1 kg 66.5 kg    Examination:  Gen: Awake, Alert, Oriented X 3, sitting up in bed,  no distress HEENT: PERRLA, positive icterus Lungs: Decreased breath sounds at both bases, otherwise clear CVS: RRR,No Gallops,Rubs or new Murmurs Abd: Soft, mildly distended, positive fluid thrill, bowel sounds present Extremities: No edema Skin: no new rashes Neuro, moves all extremities no localizing signs, no asterixis Psychiatry:   Mood & affect appropriate.     Data Reviewed:   CBC: Recent Labs  Lab 11/11/18 0226  11/12/18 1853  11/13/18 1810 11/13/18 2210 11/14/18 0312 11/15/18 0752 11/16/18 0827  WBC 5.5   < > 8.5   < > 7.2 7.4 7.3 6.8 7.3  NEUTROABS 2.4  --  7.2  --  5.2  --   --   --   --   HGB 3.8*   < > 10.1*   < > 10.1* 10.4* 11.1* 11.1* 12.5  HCT 13.0*   < > 29.8*   < > 31.5* 31.7* 34.0* 33.7* 37.5  MCV 102.4*   < > 90.0   < > 94.6 93.2 93.4 93.4 93.5  PLT 58*   < > 28*   < > 59* 58* 60* 63* 60*   < > = values in this interval not displayed.   Basic Metabolic Panel: Recent Labs  Lab 11/13/18 0316 11/13/18 1810 11/13/18 2210 11/14/18 0312 11/15/18 0246 11/16/18 0827  NA 139  --  137 136 136 136  K 3.2* 3.7 3.7 3.8 3.8 3.3*  CL 108  --  104 105 104 103  CO2 25  --  23 23 22 23   GLUCOSE 120*  --  134* 104* 108* 129*  BUN 13  --  6 7 7  5*  CREATININE 0.41*  --  0.51 0.49 0.56 0.52  CALCIUM 7.7*  --  8.2* 8.1* 8.3* 8.4*  MG 1.3* 1.7  --  1.7  --   --   PHOS 1.9* 1.4*  --  1.8*  --   --    GFR: Estimated Creatinine Clearance: 77.1 mL/min (by C-G formula based on SCr of 0.52 mg/dL). Liver Function Tests: Recent Labs  Lab 11/13/18 0316 11/13/18 2210 11/14/18 0312 11/15/18 0246 11/15/18 0752 11/16/18 0827  AST 115* 89* 81* 73*  --  62*  ALT 40 35 35 33  --  26  ALKPHOS 64 62 68 65  --  64  BILITOT 6.3* 8.8* 9.1* 9.5* 9.0* 8.8*  PROT 6.3* 6.5 6.7 6.9  --  6.0*  ALBUMIN 2.8* 2.8* 2.9* 2.7*  --  2.3*   No results for input(s): LIPASE, AMYLASE in the last 168 hours. Recent Labs  Lab 11/11/18 0650 11/13/18 0446  AMMONIA 68* 68*   Coagulation Profile: Recent Labs  Lab 11/11/18 0226 11/12/18 0526 11/13/18 0316 11/14/18 0919 11/15/18 0246  INR 2.5* 1.7* 1.6* 1.8* 1.8*   Cardiac Enzymes: No results for input(s): CKTOTAL, CKMB, CKMBINDEX, TROPONINI in the last 168 hours. BNP (last 3 results) No results for input(s): PROBNP in the last 8760 hours. HbA1C: No results for  input(s): HGBA1C in the last 72 hours. CBG: Recent Labs  Lab 11/12/18 0758 11/12/18 1138 11/12/18 1627 11/12/18 2118 11/13/18 0739  GLUCAP 124* 122* 131* 141* 98   Lipid Profile: No results for input(s): CHOL, HDL, LDLCALC, TRIG, CHOLHDL, LDLDIRECT in the last 72 hours. Thyroid Function Tests: No results for input(s): TSH, T4TOTAL, FREET4, T3FREE, THYROIDAB in the last 72 hours. Anemia Panel: No results for input(s): VITAMINB12, FOLATE, FERRITIN, TIBC, IRON, RETICCTPCT in the last 72 hours. Urine analysis:    Component Value Date/Time  COLORURINE AMBER (A) 11/11/2018 0535   APPEARANCEUR CLOUDY (A) 11/11/2018 0535   LABSPEC 1.014 11/11/2018 0535   PHURINE 7.0 11/11/2018 0535   GLUCOSEU 150 (A) 11/11/2018 0535   HGBUR NEGATIVE 11/11/2018 0535   BILIRUBINUR NEGATIVE 11/11/2018 0535   KETONESUR NEGATIVE 11/11/2018 0535   PROTEINUR 100 (A) 11/11/2018 0535   NITRITE NEGATIVE 11/11/2018 0535   LEUKOCYTESUR NEGATIVE 11/11/2018 0535   Sepsis Labs: @LABRCNTIP (procalcitonin:4,lacticidven:4)  ) Recent Results (from the past 240 hour(s))  SARS Coronavirus 2 [facility] order, Performed in [facility] hospital lab) Nasopharyngeal Nasopharyngeal Swab     Status: None   Collection Time: 11/11/18  2:26 AM   Specimen: Nasopharyngeal Swab  Result Value Ref Range Status   SARS Coronavirus 2 NEGATIVE NEGATIVE Final    Comment: (NOTE) If result is NEGATIVE SARS-CoV-2 target nucleic acids are NOT DETECTED. The SARS-CoV-2 RNA is generally detectable in upper and lower  respiratory specimens during the acute phase of infection. The lowest  concentration of SARS-CoV-2 viral copies this assay can detect is 250  copies / mL. A negative result does not preclude SARS-CoV-2 infection  and should not be used as the sole basis for treatment or other  patient management decisions.  A negative result may occur with  improper specimen collection / handling, submission of specimen other  than  nasopharyngeal swab, presence of viral mutation(s) within the  areas targeted by this assay, and inadequate number of viral copies  (<250 copies / mL). A negative result must be combined with clinical  observations, patient history, and epidemiological information. If result is POSITIVE SARS-CoV-2 target nucleic acids are DETECTED. The SARS-CoV-2 RNA is generally detectable in upper and lower  respiratory specimens dur ing the acute phase of infection.  Positive  results are indicative of active infection with SARS-CoV-2.  Clinical  correlation with patient history and other diagnostic information is  necessary to determine patient infection status.  Positive results do  not rule out bacterial infection or co-infection with other viruses. If result is PRESUMPTIVE POSTIVE SARS-CoV-2 nucleic acids MAY BE PRESENT.   A presumptive positive result was obtained on the submitted specimen  and confirmed on repeat testing.  While 2019 novel coronavirus  (SARS-CoV-2) nucleic acids may be present in the submitted sample  additional confirmatory testing may be necessary for epidemiological  and / or clinical management purposes  to differentiate between  SARS-CoV-2 and other Sarbecovirus currently known to infect humans.  If clinically indicated additional testing with an alternate test  methodology [phone]) is advised. The SARS-CoV-2 RNA is generally  detectable in upper and lower respiratory sp ecimens during the acute  phase of infection. The expected result is Negative. Fact Sheet for Patients:  StrictlyIdeas.no Fact Sheet for Healthcare Providers: BankingDealers.co.za This test is not yet approved or cleared by the [place] FDA and has been authorized for detection and/or diagnosis of SARS-CoV-2 by FDA under an Emergency Use Authorization (EUA).  This EUA will remain in effect (meaning this test can be used) for the duration of the  COVID-19 declaration under Section 564(b)(1) of the Act, 21 U.S.C. section 360bbb-3(b)(1), unless the authorization is terminated or revoked sooner. Performed at [facility], [address] [place], [place] [address]   Blood culture (routine x 2)     Status: None   Collection Time: 11/11/18  2:26 AM   Specimen: BLOOD  Result Value Ref Range Status   Specimen Description BLOOD ARTHROGRAPHIS SPECIES  Final   Special Requests  Final    BOTTLES DRAWN AEROBIC AND ANAEROBIC Blood Culture results may not be optimal due to an excessive volume of blood received in culture bottles   Culture   Final    NO GROWTH 5 DAYS Performed at [facility], [address], [place], [place] [address]    Report Status 11/16/2018 FINAL  Final  Urine Culture     Status: None   Collection Time: 11/11/18  5:35 AM   Specimen: Urine, Random  Result Value Ref Range Status   Specimen Description   Final    URINE, RANDOM Performed at [facility], [address] [place], [place] [address]    Special Requests   Final    Normal Performed at [facility], [address], [place], [place] [address]    Culture   Final    NO GROWTH Performed at [facility], [place] [address], [place], [place] [address]    Report Status 11/12/2018 FINAL  Final  MRSA PCR Screening     Status: Abnormal   Collection Time: 11/11/18  6:02 AM   Specimen: Nasal Mucosa; Nasopharyngeal  Result Value Ref Range Status   MRSA by PCR POSITIVE (A) NEGATIVE Final    Comment:        The GeneXpert MRSA Assay (FDA approved for NASAL specimens only), is one component of a comprehensive MRSA colonization surveillance program. It is not intended to diagnose MRSA infection nor to guide or monitor treatment for MRSA infections. RESULT CALLED TO, READ BACK BY AND VERIFIED WITH: Casey Burkitt AT [id number] ON 11/11/2018 JJB Performed at [facility], [address] [place], [place] [address]    Blood culture (routine x 2)     Status: None   Collection Time: 11/11/18  6:51 AM   Specimen: BLOOD  Result Value Ref Range Status   Specimen Description BLOOD LINE  Final   Special Requests   Final    BOTTLES DRAWN AEROBIC AND ANAEROBIC Blood Culture results may not be optimal due to an excessive volume of blood received in culture bottles   Culture   Final    NO GROWTH 5 DAYS Performed at [facility], [address] [place], [place] [address]    Report Status 11/16/2018 FINAL  Final  Culture, respiratory     Status: None   Collection Time: 11/12/18 10:38 AM   Specimen: Tracheal Aspirate; Respiratory  Result Value Ref Range Status   Specimen Description   Final    TRACHEAL ASPIRATE Performed at [facility], [address], [place], [place] [address]    Special Requests   Final    NONE Performed at [facility], [address] [place], [place] [address]    Gram Stain   Final    MODERATE WBC PRESENT,BOTH PMN AND MONONUCLEAR NO ORGANISMS SEEN Performed at [facility], [place] [address] [place], [place] [address]    Culture FEW METHICILLIN RESISTANT STAPHYLOCOCCUS AUREUS  Final   Report Status 11/14/2018 FINAL  Final   Organism ID, Bacteria METHICILLIN RESISTANT STAPHYLOCOCCUS AUREUS  Final      Susceptibility   Methicillin resistant staphylococcus aureus - MIC*    CIPROFLOXACIN >=8 RESISTANT Resistant     ERYTHROMYCIN >=8 RESISTANT Resistant     GENTAMICIN <=0.5 SENSITIVE Sensitive     OXACILLIN >=4 RESISTANT Resistant     TETRACYCLINE <=1 SENSITIVE Sensitive     VANCOMYCIN <=0.5 SENSITIVE Sensitive     TRIMETH/SULFA 160 RESISTANT  Resistant     CLINDAMYCIN <=0.25 SENSITIVE Sensitive     RIFAMPIN <=0.5 SENSITIVE Sensitive     Inducible Clindamycin NEGATIVE Sensitive     * FEW METHICILLIN RESISTANT STAPHYLOCOCCUS AUREUS  Body fluid culture     Status: None (Preliminary result)   Collection Time: 11/13/18  3:29 PM   Specimen: [facility]  Cytology Peritoneal fluid  Result Value Ref Range Status   Specimen Description   Final    PERITONEAL FLUID Performed at [facility], [place] [address], [place], [place] [address]    Special Requests   Final    NONE Performed at [facility], [place], [place] [address]    Gram Stain   Final    MODERATE WBC PRESENT,BOTH PMN AND MONONUCLEAR NO ORGANISMS SEEN    Culture   Final    NO GROWTH 3 DAYS Performed at [facility], [place] [address] [place], [place] [address]    Report Status PENDING  Incomplete         Radiology Studies: No results found.      Scheduled Meds: . Chlorhexidine Gluconate Cloth  6 each Topical Q0600  . Chlorhexidine Gluconate Cloth  6 each Topical Daily  . folic acid  1 mg Oral Daily  . lactulose  30 g Oral TID  . mouth rinse  15 mL Mouth Rinse BID  . multivitamin with minerals  1 tablet Oral Daily  . mupirocin ointment   Nasal BID  . pantoprazole (PROTONIX) IV  40 mg Intravenous Q12H  . thiamine  100 mg Oral Daily   Continuous Infusions: . cefTRIAXone (ROCEPHIN)  IV 200 mL/hr at 11/16/18 1100  . octreotide  (SANDOSTATIN)    IV infusion 50 mcg/hr (11/16/18 1100)     LOS: 3 days    Time spent: 23min    Domenic Polite, MD [facility]  11/16/2018, 12:05 PM

## 2018-11-16 NOTE — Progress Notes (Addendum)
Daily Rounding Note  11/16/2018, 3:24 PM  LOS: 3 days   SUBJECTIVE:   Chief complaint:     Multiple, >15, small watery/liquid greenish stools.  No abd pain.  No N/V, tolerating clears.  Mental status has cleared.  She is not offering any complaints.  OBJECTIVE:         Vital signs in last 24 hours:    Temp:  [99.4 F (37.4 C)-100.4 F (38 C)] 99.7 F (37.6 C) (08/24 1124) Pulse Rate:  [65-81] 72 (08/24 1400) Resp:  [15-16] 15 (08/24 0800) BP: (134-164)/(73-140) 134/100 (08/24 1400) SpO2:  [91 %-98 %] 92 % (08/24 1400) Weight:  [66.5 kg] 66.5 kg (08/24 0401) Last BM Date: 11/16/18 Filed Weights   11/14/18 0500 11/15/18 0500 11/16/18 0401  Weight: 65.3 kg 66.1 kg 66.5 kg   General: Looks malnourished, jaundiced.  Comfortable, alert. Heart: RRR. Chest: No labored breathing.  No cough.  Lungs clear bilaterally Abdomen: Protuberant but soft.  Not tender.  Active bowel sounds Extremities: Muscle wasting of all 4 limbs.  No edema. Neuro/Psych: Oriented x3.  Able to provide good history, recall her meds.  Knows where she is, knows the date.  Entirely appropriate.  Intake/Output from previous day: 08/23 0701 - 08/24 0700 In: 963.7 [P.O.:240; I.V.:623.7; IV Piggyback:100] Out: 400 [Urine:400]  Intake/Output this shift: Total I/O In: 312 [I.V.:212.1; IV Piggyback:99.9] Out: 0   Lab Results: Recent Labs    11/14/18 0312 11/15/18 0752 11/16/18 0827  WBC 7.3 6.8 7.3  HGB 11.1* 11.1* 12.5  HCT 34.0* 33.7* 37.5  PLT 60* 63* 60*   BMET Recent Labs    11/14/18 0312 11/15/18 0246 11/16/18 0827  NA 136 136 136  K 3.8 3.8 3.3*  CL 105 104 103  CO2 23 22 23   GLUCOSE 104* 108* 129*  BUN 7 7 5*  CREATININE 0.49 0.56 0.52  CALCIUM 8.1* 8.3* 8.4*   LFT Recent Labs    11/14/18 0312 11/15/18 0246 11/15/18 0752 11/16/18 0827  PROT 6.7 6.9  --  6.0*  ALBUMIN 2.9* 2.7*  --  2.3*  AST 81* 73*  --  62*  ALT 35  33  --  26  ALKPHOS 68 65  --  64  BILITOT 9.1* 9.5* 9.0* 8.8*  BILIDIR  --   --  3.7*  --   IBILI  --   --  5.3*  --    PT/INR Recent Labs    11/14/18 0919 11/15/18 0246  LABPROT 20.7* 20.6*  INR 1.8* 1.8*   Hepatitis Panel No results for input(s): HEPBSAG, HCVAB, HEPAIGM, HEPBIGM in the last 72 hours.  Studies/Results: No results found.  Scheduled Meds: . Chlorhexidine Gluconate Cloth  6 each Topical Q0600  . Chlorhexidine Gluconate Cloth  6 each Topical Daily  . folic acid  1 mg Oral Daily  . lactulose  20 g Oral BID  . mouth rinse  15 mL Mouth Rinse BID  . multivitamin with minerals  1 tablet Oral Daily  . mupirocin ointment   Nasal BID  . pantoprazole (PROTONIX) IV  40 mg Intravenous Q12H  . thiamine  100 mg Oral Daily   Continuous Infusions: . sodium chloride 10 mL/hr at 11/16/18 1400  . cefTRIAXone (ROCEPHIN)  IV Stopped (11/16/18 1110)  . octreotide  (SANDOSTATIN)    IV infusion 50 mcg/hr (11/16/18 1400)   PRN Meds:.sodium chloride, ondansetron (ZOFRAN) IV   ASSESMENT:   *  Alcoholic cirrhosis.  Meld as of 8/23 is 21.   T bili 3.3 >> 9.5 >> 9.  *   GI bleed, with Hematemesis, admitted with shock to [facility] 11/11/18, requiring pressors and vent.   GI bleed with melena 05/2018.  EGD then by Dr. Lucilla Lame revealed hiatal hernia.  Grade 1, nonbleeding esophageal varices, portal hypertensive gastropathy, duodenal erythema 11/12/2018 EGD (Dr Jonathon Bellows) revealing grade 1 varices, portal hypertensive gastropathy.  No fresh or old blood. 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  Patent portal vein.  Small grade 1 esophageal varices, jejunal varices.  Transferred to [facility] for possible TIPS. Dr. Kathlene Cote is following patient.  Prefers to hold on TIPS given patient has become stable and no further active bleeding..  Suggests "transfer/referral to liver  transplant center as TIPS in this setting is very high risk and may worsen hepatic failure."  However he also feels rapidly rising bilirubin is driving up her meld score which in turn drives up her risk of complications in the setting of TIPS. Continues on Protonix 40 IV BID.  Octreotide infusion.  Rocephin.  *     Blood loss anemia.  Hgb at admission 3.8 (8.6 three weeks prior).>>>  12.5 today.   S/p PRBCs x 7  *    Thrombocytopenia. S/p platelets x 1 unit  *    Coagulopathy.  S/p 2 FFP.  INR 2.5 >> 1.8.    *     Hepatic encephalopathy.  Ammonia 68.  BID Lactulose in place.    *   Ascites.  2 L paracentesis 8/21, fluid described as bloody.  2774 WBCs, 30% neutrophiils.  Meets criteria for SBP.  On Rocephin.    *    Malnutrition, low albumin.   RECOMMENDATIONS   *   CCM working on transfer to [facility], but waiting on available bed.    *   Agree with decision to reduce dose of lactulose.  Added rifaximin which she takes at home.  *    Leaving on clears.  I did offer the option of upgrade to full liquids but patient does not find this appealing and would rather stick with clear liquids.  ?  Skip fulls directly to solid food?   Lisa Dennis  11/16/2018, 3:24 PM Phone [phone]     Attending Physician Note   I have taken an interval history, reviewed the chart and examined the patient. I agree with the Advanced Practitioner's note, impression and recommendations. No recurrent bleeding noted. Hb is stable. 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  Patent portal vein.  Small grade 1 esophageal varices, jejunal varices. She is at a high risk for recurrent variceal bleeding. Continue IV octreotide. Trend CBC. IR defers TIPS due to higher risk of hepatic decompensation. Transfer to [facility] for consideration of TIPS and for further mgmt is pending. Local outpatient GI follow up  with Dr. Lucilla Lame.   Lucio Edward, MD [facility]

## 2018-11-17 DIAGNOSIS — K652 Spontaneous bacterial peritonitis: Secondary | ICD-10-CM

## 2018-11-17 DIAGNOSIS — R58 Hemorrhage, not elsewhere classified: Secondary | ICD-10-CM

## 2018-11-17 DIAGNOSIS — K7031 Alcoholic cirrhosis of liver with ascites: Secondary | ICD-10-CM

## 2018-11-17 LAB — COMPREHENSIVE METABOLIC PANEL
ALT: 26 U/L (ref 0–44)
AST: 60 U/L — ABNORMAL HIGH (ref 15–41)
Albumin: 2.2 g/dL — ABNORMAL LOW (ref 3.5–5.0)
Alkaline Phosphatase: 65 U/L (ref 38–126)
Anion gap: 11 (ref 5–15)
BUN: 6 mg/dL (ref 6–20)
CO2: 21 mmol/L — ABNORMAL LOW (ref 22–32)
Calcium: 8.6 mg/dL — ABNORMAL LOW (ref 8.9–10.3)
Chloride: 103 mmol/L (ref 98–111)
Creatinine, Ser: 0.55 mg/dL (ref 0.44–1.00)
GFR calc Af Amer: >60 mL/min (ref >60)
GFR calc non Af Amer: >60 mL/min (ref >60)
Glucose, Bld: 139 mg/dL — ABNORMAL HIGH (ref 70–99)
Potassium: 3.6 mmol/L (ref 3.5–5.1)
Sodium: 135 mmol/L (ref 135–145)
Total Bilirubin: 7.8 mg/dL — ABNORMAL HIGH (ref 0.3–1.2)
Total Protein: 6 g/dL — ABNORMAL LOW (ref 6.5–8.1)

## 2018-11-17 LAB — BODY FLUID CULTURE: Culture: NO GROWTH

## 2018-11-17 LAB — CYTOLOGY - NON PAP

## 2018-11-17 LAB — PROTIME-INR
INR: 2.3 — ABNORMAL HIGH (ref 0.8–1.2)
Prothrombin Time: 25 seconds — ABNORMAL HIGH (ref 11.4–15.2)

## 2018-11-17 LAB — CBC
HCT: 40.5 % (ref 36.0–46.0)
Hemoglobin: 13.4 g/dL (ref 12.0–15.0)
MCH: 31.2 pg (ref 26.0–34.0)
MCHC: 33.1 g/dL (ref 30.0–36.0)
MCV: 94.2 fL (ref 80.0–100.0)
Platelets: 73 10*3/uL — ABNORMAL LOW (ref 150–400)
RBC: 4.3 MIL/uL (ref 3.87–5.11)
RDW: 19.2 % — ABNORMAL HIGH (ref 11.5–15.5)
WBC: 6.6 10*3/uL (ref 4.0–10.5)
nRBC: 0 % (ref 0.0–0.2)

## 2018-11-17 MED ORDER — LORAZEPAM 0.5 MG PO TABS
0.5000 mg | ORAL_TABLET | Freq: Once | ORAL | Status: AC
  Administered 2018-11-17: 0.5 mg via ORAL
  Filled 2018-11-17: qty 1

## 2018-11-17 MED ORDER — LACTULOSE 10 GM/15ML PO SOLN
10.0000 g | Freq: Two times a day (BID) | ORAL | Status: DC
  Administered 2018-11-17 – 2018-11-23 (×13): 10 g via ORAL
  Filled 2018-11-17 (×13): qty 15

## 2018-11-17 NOTE — Progress Notes (Signed)
Called Nurse. Patient states AD is already complete

## 2018-11-17 NOTE — Progress Notes (Signed)
PROGRESS NOTE    Lisa Dennis  [id number] DOB: [birth year] DOA: 11/13/2018 PCP: Pccm, Md, MD  PCCM transferred to [facility] 8/23 Brief Narrative: 53 year old female with history of alcohol abuse and alcoholic liver cirrhosis, was admitted to [facility] on 8/19 with loss of consciousness and hemorrhagic shock, hemoglobin of 3.8, she received 8 units of PRBC, 4 units of FFP and 2 units of platelets, subsequently had EGD which showed grade 1 varices but no evidence of fresh blood in the stomach.   She underwent a paracentesis which revealed hemorrhagic peritoneal fluid.  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 These very large varices felt to be high risk for continued bleeding. -Subsequently hemoglobin has remained stable she was transferred to ICU at [facility] for IR evaluation for TIPS, seen by GI as well, transferred from PCCM to [facility] 8/23 -I called [facility] and [facility] 8/23, no beds at either place, on wait list for [facility] transfer  Assessment & Plan:   Hemorrhagic shock -Due to intraperitoneal bleed from massive portosystemic shunt, intraperitoneal ascending colonic varices -Admitted with shock, hemoglobin of 3.8 to [facility] -Status post 8 units of PRBC, 4 units of FFP, 2 units of platelets -Ultrasound paracentesis 8/21 drained 2 L of blood, -CTA of the abdomen which showed 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 These very large varices felt to be high risk for continued bleeding -Hemoglobin now stable however continues to be at high risk for continued bleeding from these very large varices, continue IV octreotide -IR and GI following -TIPS felt to be very high risk in the setting of decompensated cirrhosis, bilirubin of 9  -IR and GI recommended transfer to tertiary center, I called [facility] 8/23( d/w Dr.Bassett-Hepatology no beds, declined they do not have a waiting list since [place]), called [facility] (d/w Dr.King -Hepatology and Dr.Chandiramani/hospital Medicine) -no beds, accepted but on wait list, awaiting stepdown bed -Remains stable, low-grade fever suspected to be secondary to intraperitoneal blood, continue ceftriaxone, white count is stable  Alcoholic cirrhosis/decompensated -Diuretics on hold -Meld score is 22  Hepatic encephalopathy -Mentation has improved, she is oriented x3, no asterixis , continue lactulose -Decrease lactulose dose due to frequency of diarrhea  History of alcohol abuse -No evidence of withdrawal, reportedly stopped drinking about a month ago  Chronic thrombocytopenia -Stable  DVT prophylaxis: SCDs Code Status: Full code Family Communication: No family at bedside, will call and update patient's mother today Disposition Plan: Stepdown at [facility] when bed available  Consultants:   IR  Gastroenterology   Procedures:   Antimicrobials:    Subjective:  -No events overnight, having frequent stools on lactulose, low-grade fever of 100.3  Objective: Vitals:   11/17/18 0200 11/17/18 0300 11/17/18 0500 11/17/18 0600  BP: (!) 161/96 (!) 162/85 (!) 159/86 (!) 142/89  Pulse: 75 73 69 79  Resp: 17 16 16 16   Temp:    (!) 100.6 F (38.1 C)  TempSrc:    Oral  SpO2: (!) 89% 98% 97% 98%  Weight:    64.5 kg  Height:        Intake/Output Summary (Last 24 hours) at 11/17/2018 1357 Last data filed at 11/17/2018 1100 Gross per 24 hour  Intake 34.98 ml  Output 300 ml  Net -265.02 ml   [employer]  11/15/18 0500 11/16/18 0401 11/17/18 0600  Weight: 66.1 kg 66.5 kg 64.5 kg    Examination:  Gen: Awake, Alert, Oriented X 3, no distress, sitting up in bed HEENT: PERRLA, positive icterus Lungs: Decreased breath sounds at both bases CVS: RRR,No Gallops,Rubs or new  Murmurs Abd: Soft, mildly distended, nontender, positive fluid thrill Extremities: No edema Skin: no new rashes Neuro, moves all extremities no localizing signs, no asterixis Psychiatry:  Mood & affect appropriate.     Data Reviewed:   CBC: Recent Labs  Lab 11/11/18 0226  11/12/18 1853  11/13/18 1810 11/13/18 2210 11/14/18 0312 11/15/18 0752 11/16/18 0827 11/17/18 1330  WBC 5.5   < > 8.5   < > 7.2 7.4 7.3 6.8 7.3 6.6  NEUTROABS 2.4  --  7.2  --  5.2  --   --   --   --   --   HGB 3.8*   < > 10.1*   < > 10.1* 10.4* 11.1* 11.1* 12.5 13.4  HCT 13.0*   < > 29.8*   < > 31.5* 31.7* 34.0* 33.7* 37.5 40.5  MCV 102.4*   < > 90.0   < > 94.6 93.2 93.4 93.4 93.5 94.2  PLT 58*   < > 28*   < > 59* 58* 60* 63* 60* 73*   < > = values in this interval not displayed.   Basic Metabolic Panel: Recent Labs  Lab 11/13/18 0316 11/13/18 1810 11/13/18 2210 11/14/18 0312 11/15/18 0246 11/16/18 0827  NA 139  --  137 136 136 136  K 3.2* 3.7 3.7 3.8 3.8 3.3*  CL 108  --  104 105 104 103  CO2 25  --  23 23 22 23   GLUCOSE 120*  --  134* 104* 108* 129*  BUN 13  --  6 7 7  5*  CREATININE 0.41*  --  0.51 0.49 0.56 0.52  CALCIUM 7.7*  --  8.2* 8.1* 8.3* 8.4*  MG 1.3* 1.7  --  1.7  --   --   PHOS 1.9* 1.4*  --  1.8*  --   --    GFR: Estimated Creatinine Clearance: 71 mL/min (by C-G formula based on SCr of 0.52 mg/dL). Liver Function Tests: Recent Labs  Lab 11/13/18 0316 11/13/18 2210 11/14/18 0312 11/15/18 0246 11/15/18 0752 11/16/18 0827  AST 115* 89* 81* 73*  --  62*  ALT 40 35 35 33  --  26  ALKPHOS 64 62 68 65  --  64  BILITOT 6.3* 8.8* 9.1* 9.5* 9.0* 8.8*  PROT 6.3* 6.5 6.7 6.9  --  6.0*  ALBUMIN 2.8* 2.8* 2.9* 2.7*  --  2.3*   No results for input(s): LIPASE, AMYLASE in the last 168 hours. Recent Labs  Lab 11/11/18 0650 11/13/18 0446  AMMONIA 68* 68*   Coagulation Profile: Recent Labs  Lab 11/11/18 0226 11/12/18 0526 11/13/18 0316 11/14/18 0919 11/15/18 0246  INR  2.5* 1.7* 1.6* 1.8* 1.8*   Cardiac Enzymes: No results for input(s): CKTOTAL, CKMB, CKMBINDEX, TROPONINI in the last 168 hours. BNP (last 3 results) No results for input(s): PROBNP in the last 8760 hours. HbA1C: No results for input(s): HGBA1C in the last 72 hours. CBG: Recent Labs  Lab 11/12/18 0758 11/12/18 1138 11/12/18 1627 11/12/18 2118 11/13/18 0739  GLUCAP 124* 122* 131* 141* 98   Lipid Profile: No results for input(s): CHOL, HDL, LDLCALC, TRIG, CHOLHDL, LDLDIRECT in the last 72 hours. Thyroid Function Tests: No results for input(s): TSH,  T4TOTAL, FREET4, T3FREE, THYROIDAB in the last 72 hours. Anemia Panel: No results for input(s): VITAMINB12, FOLATE, FERRITIN, TIBC, IRON, RETICCTPCT in the last 72 hours. Urine analysis:    Component Value Date/Time   COLORURINE AMBER (A) 11/11/2018 0535   APPEARANCEUR CLOUDY (A) 11/11/2018 0535   LABSPEC 1.014 11/11/2018 0535   PHURINE 7.0 11/11/2018 0535   GLUCOSEU 150 (A) 11/11/2018 0535   HGBUR NEGATIVE 11/11/2018 0535   BILIRUBINUR NEGATIVE 11/11/2018 0535   KETONESUR NEGATIVE 11/11/2018 0535   PROTEINUR 100 (A) 11/11/2018 0535   NITRITE NEGATIVE 11/11/2018 0535   LEUKOCYTESUR NEGATIVE 11/11/2018 0535   Sepsis Labs: @LABRCNTIP (procalcitonin:4,lacticidven:4)  ) Recent Results (from the past 240 hour(s))  SARS Coronavirus 2 [facility] order, Performed in [facility] hospital lab) Nasopharyngeal Nasopharyngeal Swab     Status: None   Collection Time: 11/11/18  2:26 AM   Specimen: Nasopharyngeal Swab  Result Value Ref Range Status   SARS Coronavirus 2 NEGATIVE NEGATIVE Final    Comment: (NOTE) If result is NEGATIVE SARS-CoV-2 target nucleic acids are NOT DETECTED. The SARS-CoV-2 RNA is generally detectable in upper and lower  respiratory specimens during the acute phase of infection. The lowest  concentration of SARS-CoV-2 viral copies this assay can detect is 250  copies / mL. A negative result does not preclude  SARS-CoV-2 infection  and should not be used as the sole basis for treatment or other  patient management decisions.  A negative result may occur with  improper specimen collection / handling, submission of specimen other  than nasopharyngeal swab, presence of viral mutation(s) within the  areas targeted by this assay, and inadequate number of viral copies  (<250 copies / mL). A negative result must be combined with clinical  observations, patient history, and epidemiological information. If result is POSITIVE SARS-CoV-2 target nucleic acids are DETECTED. The SARS-CoV-2 RNA is generally detectable in upper and lower  respiratory specimens dur ing the acute phase of infection.  Positive  results are indicative of active infection with SARS-CoV-2.  Clinical  correlation with patient history and other diagnostic information is  necessary to determine patient infection status.  Positive results do  not rule out bacterial infection or co-infection with other viruses. If result is PRESUMPTIVE POSTIVE SARS-CoV-2 nucleic acids MAY BE PRESENT.   A presumptive positive result was obtained on the submitted specimen  and confirmed on repeat testing.  While 2019 novel coronavirus  (SARS-CoV-2) nucleic acids may be present in the submitted sample  additional confirmatory testing may be necessary for epidemiological  and / or clinical management purposes  to differentiate between  SARS-CoV-2 and other Sarbecovirus currently known to infect humans.  If clinically indicated additional testing with an alternate test  methodology [phone]) is advised. The SARS-CoV-2 RNA is generally  detectable in upper and lower respiratory sp ecimens during the acute  phase of infection. The expected result is Negative. Fact Sheet for Patients:  StrictlyIdeas.no Fact Sheet for Healthcare Providers: BankingDealers.co.za This test is not yet approved or cleared by the  [place] FDA and has been authorized for detection and/or diagnosis of SARS-CoV-2 by FDA under an Emergency Use Authorization (EUA).  This EUA will remain in effect (meaning this test can be used) for the duration of the COVID-19 declaration under Section 564(b)(1) of the Act, 21 U.S.C. section 360bbb-3(b)(1), unless the authorization is terminated or revoked sooner. Performed at [facility], [address] [place], [place] [address]   Blood culture (routine x 2)  Status: None   Collection Time: 11/11/18  2:26 AM   Specimen: BLOOD  Result Value Ref Range Status   Specimen Description BLOOD ARTHROGRAPHIS SPECIES  Final   Special Requests   Final    BOTTLES DRAWN AEROBIC AND ANAEROBIC Blood Culture results may not be optimal due to an excessive volume of blood received in culture bottles   Culture   Final    NO GROWTH 5 DAYS Performed at [facility], [address] [place], [place] [address]    Report Status 11/16/2018 FINAL  Final  Urine Culture     Status: None   Collection Time: 11/11/18  5:35 AM   Specimen: Urine, Random  Result Value Ref Range Status   Specimen Description   Final    URINE, RANDOM Performed at [facility], [address] [place], [place] [address]    Special Requests   Final    Normal Performed at [facility], [address] [place], [place] [address]    Culture   Final    NO GROWTH Performed at [facility], [place] [address], [place], [place] [address]    Report Status 11/12/2018 FINAL  Final  MRSA PCR Screening     Status: Abnormal   Collection Time: 11/11/18  6:02 AM   Specimen: Nasal Mucosa; Nasopharyngeal  Result Value Ref Range Status   MRSA by PCR POSITIVE (A) NEGATIVE Final    Comment:        The GeneXpert MRSA Assay (FDA approved for NASAL specimens only), is one component of a comprehensive MRSA colonization surveillance program. It is not intended to diagnose MRSA infection  nor to guide or monitor treatment for MRSA infections. RESULT CALLED TO, READ BACK BY AND VERIFIED WITH: Casey Burkitt AT [id number] ON 11/11/2018 JJB Performed at [facility], [address] [place], [place] [address]   Blood culture (routine x 2)     Status: None   Collection Time: 11/11/18  6:51 AM   Specimen: BLOOD  Result Value Ref Range Status   Specimen Description BLOOD LINE  Final   Special Requests   Final    BOTTLES DRAWN AEROBIC AND ANAEROBIC Blood Culture results may not be optimal due to an excessive volume of blood received in culture bottles   Culture   Final    NO GROWTH 5 DAYS Performed at [facility], [address] [place], [place] [address]    Report Status 11/16/2018 FINAL  Final  Culture, respiratory     Status: None   Collection Time: 11/12/18 10:38 AM   Specimen: Tracheal Aspirate; Respiratory  Result Value Ref Range Status   Specimen Description   Final    TRACHEAL ASPIRATE Performed at [facility], [address], [place], [place] [address]    Special Requests   Final    NONE Performed at [facility], [address] [place], [place] [address]    Gram Stain   Final    MODERATE WBC PRESENT,BOTH PMN AND MONONUCLEAR NO ORGANISMS SEEN Performed at [facility], [place] [address] [place], [place] [address]    Culture FEW METHICILLIN RESISTANT STAPHYLOCOCCUS AUREUS  Final   Report Status 11/14/2018 FINAL  Final   Organism ID, Bacteria METHICILLIN RESISTANT STAPHYLOCOCCUS AUREUS  Final      Susceptibility   Methicillin resistant staphylococcus aureus - MIC*    CIPROFLOXACIN >=8 RESISTANT Resistant     ERYTHROMYCIN >=8 RESISTANT Resistant  GENTAMICIN <=0.5 SENSITIVE Sensitive     OXACILLIN >=4 RESISTANT Resistant     TETRACYCLINE <=1 SENSITIVE Sensitive     VANCOMYCIN <=0.5 SENSITIVE Sensitive     TRIMETH/SULFA 160 RESISTANT Resistant     CLINDAMYCIN <=0.25 SENSITIVE Sensitive     RIFAMPIN <=0.5  SENSITIVE Sensitive     Inducible Clindamycin NEGATIVE Sensitive     * FEW METHICILLIN RESISTANT STAPHYLOCOCCUS AUREUS  Body fluid culture     Status: None   Collection Time: 11/13/18  3:29 PM   Specimen: [facility] Peritoneal fluid  Result Value Ref Range Status   Specimen Description   Final    PERITONEAL FLUID Performed at [facility], [place] [address] [place], [place] [address]    Special Requests   Final    NONE Performed at [facility], [place], [place] [address]    Gram Stain   Final    MODERATE WBC PRESENT,BOTH PMN AND MONONUCLEAR NO ORGANISMS SEEN    Culture   Final    NO GROWTH 3 DAYS Performed at [facility], [place] [address], [place], [place] [address]    Report Status 11/17/2018 FINAL  Final         Radiology Studies: No results found.      Scheduled Meds: . Chlorhexidine Gluconate Cloth  6 each Topical Q0600  . Chlorhexidine Gluconate Cloth  6 each Topical Daily  . feeding supplement (ENSURE ENLIVE)  237 mL Oral BID BM  . folic acid  1 mg Oral Daily  . lactulose  10 g Oral BID  . mouth rinse  15 mL Mouth Rinse BID  . multivitamin with minerals  1 tablet Oral Daily  . mupirocin ointment   Nasal BID  . pantoprazole (PROTONIX) IV  40 mg Intravenous Q12H  . rifaximin  550 mg Oral BID  . thiamine  100 mg Oral Daily   Continuous Infusions: . sodium chloride 10 mL/hr at 11/16/18 1400  . cefTRIAXone (ROCEPHIN)  IV 1 g (11/17/18 1030)  . octreotide  (SANDOSTATIN)    IV infusion 50 mcg/hr (11/17/18 0606)     LOS: 4 days    Time spent: 35min    Domenic Polite, MD [facility]  11/17/2018, 1:57 PM

## 2018-11-17 NOTE — Progress Notes (Addendum)
Patient ID: Lisa Dennis, female   DOB: Jun 16, 1965, 53 y.o.   MRN: [id number]    Progress Note   Subjective  Day # 4 CC; decompensated cirrhosis-recent hemorrhagic shock secondary to intraperitoneal bleed, from massive portosystemic shunt large intraperitoneal and ascending colonic varices- awaiting transfer to [facility] for probable TIPS.  No new labs today Doing well today, significantly less bowel movements with decreasing lactulose.  1 loose stool this a.m.  She was able to get up and ambulate to the bathroom on her own with walker today. No complaints of abdominal pain and asking for increase in diet     Objective   Vital signs in last 24 hours: Temp:  [100 F (37.8 C)-100.6 F (38.1 C)] 100.6 F (38.1 C) (08/25 0600) Pulse Rate:  [69-96] 79 (08/25 0600) Resp:  [13-24] 16 (08/25 0600) BP: (134-162)/(77-100) 142/89 (08/25 0600) SpO2:  [87 %-98 %] 98 % (08/25 0600) Weight:  [64.5 kg] 64.5 kg (08/25 0600) Last BM Date: 11/16/18 General:    white female in NAD [place]:  Regular rate and rhythm; no murmurs Lungs: Respirations even and unlabored, lungs CTA bilaterally Abdomen:  Soft, nontender protuberant, non-tense ascites. Normal bowel sounds. Extremities:  Without edema. Neurologic:  Alert and oriented,  grossly normal neurologically.  Mentating well, no asterixis Psych:  Cooperative. Normal mood and affect.  Intake/Output from previous day: 08/24 0701 - 08/25 0700 In: 312 [I.V.:212.1; IV Piggyback:99.9] Out: 0  Intake/Output this shift: Total I/O In: -  Out: 300 [Urine:300]  Lab Results: Recent Labs    11/15/18 0752 11/16/18 0827  WBC 6.8 7.3  HGB 11.1* 12.5  HCT 33.7* 37.5  PLT 63* 60*   BMET Recent Labs    11/15/18 0246 11/16/18 0827  NA 136 136  K 3.8 3.3*  CL 104 103  CO2 22 23  GLUCOSE 108* 129*  BUN 7 5*  CREATININE 0.56 0.52  CALCIUM 8.3* 8.4*   LFT Recent Labs    11/15/18 0752 11/16/18 0827  PROT  --  6.0*  ALBUMIN  --   2.3*  AST  --  62*  ALT  --  26  ALKPHOS  --  64  BILITOT 9.0* 8.8*  BILIDIR 3.7*  --   IBILI 5.3*  --    PT/INR Recent Labs    11/15/18 0246  LABPROT 20.6*  INR 1.8*         Assessment / Plan:    #3 53 year old female with decompensated alcohol induced cirrhosis, initially presenting to [facility] 11/11/2018 with hemorrhagic shock requiring pressors and vent. EGD 11/12/2018-read 1 varices, portal hypertensive gastropathy CT angiography showed massive portosystemic shunt tween right colic and right gonadal veins resulting in large intraperitoneal and a sending colon varices.  Patient was transferred to [facility] for possible TIPS.  Patient has not had any further active intra-abdominal bleeding and IR here preferring to hold on TIPS and suggested transfer to transplant center for consideration of TIPS which is felt to be very high risk [facility] has agreed to take patient when bed available  Hemoglobin has been stable-total of 7 units packed RBC since onset.  Continues on twice daily PPI, Rocephin and octreotide  #2 coagulopathy-improved #3 hepatic encephalopathy-thus abated by shock and intra-abdominal bleeding-clinically resolved, and now on twice daily lactulose #4 ascites-status post large-volume paracentesis 821 and counts were consistent with SBP-on Rocephin, will treat x7 days then convert to Cipro 500 mg p.o. daily for maintenance prophylaxis  #5 hypoalbuminemia-advance diet and continue Ensure  Start ambulating with help  Current M ELD=21   Active Problems:   GI bleeding   Hepatic encephalopathy (HCC)   Alcoholic cirrhosis of liver with ascites (HCC)   Hemorrhage, intraperitoneal   Intra-abdominal varices    LOS: 4 days   Amy Esterwood  11/17/2018, 12:05 PM      Attending Physician Note   I have taken an interval history, reviewed the chart and examined the patient. I agree with the Advanced Practitioner's note, impression and recommendations.   * Decompensated  alcoholic cirrhosis with ascites, SBP, HE, varices.  * Massive portosystemic shunt between right colic and right gonadal veins resulting in a large intraperitoneal ascending colon varices. Small grade 1 esophageal varices, jejunal varices. She is at high risk for recurrent intra-abdominal and luminal variceal bleeding.   Continue IV octreotide Rocephin IV for 7 days and then Cipro qd for prophylaxis for SBP Trend CBC Reconsult IR if bleeding recurs to consider urgent TIPS  Transfer to [facility] for consideration of TIPS and further mgmt is pending  No additional recommendations however please call if GI assistance is needed GI follow up with Dr. Lucilla Lame  Lucio Edward, MD [facility] Gastroenterology

## 2018-11-18 LAB — COMPREHENSIVE METABOLIC PANEL
ALT: 23 U/L (ref 0–44)
AST: 52 U/L — ABNORMAL HIGH (ref 15–41)
Albumin: 2.3 g/dL — ABNORMAL LOW (ref 3.5–5.0)
Alkaline Phosphatase: 62 U/L (ref 38–126)
Anion gap: 10 (ref 5–15)
BUN: 7 mg/dL (ref 6–20)
CO2: 22 mmol/L (ref 22–32)
Calcium: 8.5 mg/dL — ABNORMAL LOW (ref 8.9–10.3)
Chloride: 101 mmol/L (ref 98–111)
Creatinine, Ser: 0.61 mg/dL (ref 0.44–1.00)
GFR calc Af Amer: >60 mL/min (ref >60)
GFR calc non Af Amer: >60 mL/min (ref >60)
Glucose, Bld: 100 mg/dL — ABNORMAL HIGH (ref 70–99)
Potassium: 3.6 mmol/L (ref 3.5–5.1)
Sodium: 133 mmol/L — ABNORMAL LOW (ref 135–145)
Total Bilirubin: 7.5 mg/dL — ABNORMAL HIGH (ref 0.3–1.2)
Total Protein: 5.9 g/dL — ABNORMAL LOW (ref 6.5–8.1)

## 2018-11-18 LAB — CBC
HCT: 40.2 % (ref 36.0–46.0)
Hemoglobin: 13.2 g/dL (ref 12.0–15.0)
MCH: 30.8 pg (ref 26.0–34.0)
MCHC: 32.8 g/dL (ref 30.0–36.0)
MCV: 93.9 fL (ref 80.0–100.0)
Platelets: 69 10*3/uL — ABNORMAL LOW (ref 150–400)
RBC: 4.28 MIL/uL (ref 3.87–5.11)
RDW: 18.9 % — ABNORMAL HIGH (ref 11.5–15.5)
WBC: 6.2 10*3/uL (ref 4.0–10.5)
nRBC: 0 % (ref 0.0–0.2)

## 2018-11-18 LAB — PROTIME-INR
INR: 2.1 — ABNORMAL HIGH (ref 0.8–1.2)
Prothrombin Time: 23.6 seconds — ABNORMAL HIGH (ref 11.4–15.2)

## 2018-11-18 LAB — TOTAL BILIRUBIN, BODY FLUID: Total bilirubin, fluid: 3.2 mg/dL

## 2018-11-18 MED ORDER — PHYTONADIONE 5 MG PO TABS
10.0000 mg | ORAL_TABLET | Freq: Once | ORAL | Status: AC
  Administered 2018-11-18: 10 mg via ORAL
  Filled 2018-11-18: qty 2

## 2018-11-18 NOTE — Progress Notes (Signed)
Responded to unit page to assist with get notary for patient.  Notary went to bedside and spoke with patient along with two witnesses from volunteer services and notarized documents.  Chaplain available as needed.  Jaclynn Major, [place], [facility], Pager [phone]

## 2018-11-18 NOTE — Progress Notes (Signed)
PROGRESS NOTE    Lisa Dennis  [id number] DOB: [birth year] DOA: 11/13/2018 PCP: [facility], Md, MD   Brief Narrative: 53 year old female with past medical history significant for alcohol abuse and alcoholic liver cirrhosis, was admitted to [facility] on 8/19 with loss of consciousness and hemorrhagic shock, hemoglobin of 3.8, she received 8 units of packed red blood cell, 4 units of FFP and 2 units of platelets, subsequently had endoscopy which showed grade 1 varices but no evidence of fresh blood in the stomach. She underwent a paracentesis which revealed hemorrhagic peritoneal fluid.  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  These very large varices felt to be high risk for continued bleeding.   Subsequently hemoglobin has remained stable she was transferred to ICU at [facility]  for IR evaluation for TIPS, Seen  by GI as well, transfer from [facility] to [facility] on 8/23. IR and GI recommending transfer to tertiary center.  Dr. Broadus John  spoke with [facility] and [facility] 8/23, no bed at either placed on waiting list for the [facility] transfer.    Assessment & Plan:   Active Problems:   GI bleeding   Hepatic encephalopathy (HCC)   Alcoholic cirrhosis of liver with ascites (HCC)   Hemorrhage, intraperitoneal   Intra-abdominal varices   Bleeding behind the abdominal cavity   SBP (spontaneous bacterial peritonitis) ([place])   1-Hemorrhagic shock Due to intraperitoneal bleed from massive portosystemic shunt, intraperitoneal ascending colonic diuresis. 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  These very large varices felt to be high risk for continued bleed. -Hemoglobin has remained stable she continues to be on octreotide -IR and GI has been following. -TIPS felt to be very high risk in the setting of decompensated cirrhosis, bilirubin of 9. -IR and GI recommended transfer to tertiary center, Dr. Broadus John: [facility] transfer was denied.  She called  [facility] discussed case with Dr. Edison Pace Hepatology  and Dr. Quillian Quince no beds available but patient is on the waiting list for stepdown bed. -Continue with ceftriaxone. -Hemoglobin is stable, INR at 2.1 today.  Will order vitamin K  2-Alcoholic  cirrhosis decompensated Diuretics on hold AST is stable at 52, ALT 23, total bilirubin is stable at 7.5 today. INR mildly elevated at 2.1.  Yesterday 2.3.  Will give vitamin K.  3-hepatic encephalopathy: -Mentation has improved.  Alert and oriented no asterixis.  Continue with lactulose and rifaximin  4-history of alcohol abuse: No evidence of withdrawal Stop drinking about a month ago. Chronic thrombocytopenia: Stable           Estimated body mass index is 23.41 kg/m as calculated from the following:   Height as of this encounter: 5\' 4"  (1.626 m).   Weight as of this encounter: 61.9 kg.   DVT prophylaxis: SCDs Code Status: Full code Family Communication: Care discussed with patient Disposition Plan: Waiting bed at  [facility] Consultants:   IR  GI  Procedures:   paracentesis  Antimicrobials:    Subjective: Denies blood in the stool, denies worsening abdominal pain, having multiple bowel movement.  Alert and  oriented.  Objective: Vitals:   11/17/18 1825 11/17/18 2118 11/17/18 2141 11/18/18 0547  BP: 139/83 137/86  139/82  Pulse:    82  Resp: (!) 21 19 18  (!) 21  Temp:   99.3 F (37.4 C) 99.6 F (37.6 C)  TempSrc:   Oral Oral  SpO2:    92%  Weight:    61.9 kg  Height:        Intake/Output Summary (Last  24 hours) at 11/18/2018 1443 Last data filed at 11/18/2018 1100 Gross per 24 hour  Intake 995 ml  Output 300 ml  Net 695 ml   Filed Weights   11/16/18 0401 11/17/18 0600 11/18/18 0547  Weight: 66.5 kg 64.5 kg 61.9 kg    Examination:  General exam: Appears calm and comfortable  Respiratory system: Clear to auscultation. Respiratory effort normal. Cardiovascular system: S1 & S2 heard, RRR. No JVD, murmurs, rubs, gallops or clicks. No pedal edema. Gastrointestinal system: Abdomen is nondistended, soft and nontender. No organomegaly or masses felt. Normal bowel sounds heard. Central nervous system: Alert and oriented. No focal neurological deficits. Extremities: Symmetric 5 x 5 power. Skin: No rashes, lesions or ulcers Psychiatry: Judgement and insight appear normal. Mood & affect appropriate.     Data Reviewed: I have personally reviewed following labs and imaging studies  CBC: Recent Labs  Lab 11/12/18 1853  11/13/18 1810  11/14/18 0312 11/15/18 0752 11/16/18 0827 11/17/18 1330 11/18/18 0300  WBC 8.5   < > 7.2   < > 7.3 6.8 7.3 6.6 6.2  NEUTROABS 7.2  --  5.2  --   --   --   --   --   --   HGB 10.1*   < > 10.1*   < > 11.1* 11.1* 12.5 13.4 13.2  HCT 29.8*   < > 31.5*   < > 34.0* 33.7* 37.5 40.5 40.2  MCV 90.0   < > 94.6   < > 93.4 93.4 93.5 94.2 93.9  PLT 28*   < > 59*   < > 60* 63* 60* 73* 69*   < > = values in this interval not displayed.   Basic Metabolic Panel: Recent Labs  Lab 11/13/18 0316 11/13/18 1810  11/14/18 0312 11/15/18 0246 11/16/18 0827 11/17/18 1330 11/18/18 0300  NA 139  --    < > 136 136 136 135 133*  K 3.2* 3.7   < > 3.8 3.8 3.3* 3.6 3.6  CL 108  --    < > 105 104 103 103 101  CO2 25  --    < > 23 22 23  21* 22  GLUCOSE 120*  --    < > 104* 108* 129* 139* 100*  BUN 13  --    < > 7 7 5* 6 7  CREATININE 0.41*  --    < > 0.49 0.56 0.52 0.55 0.61  CALCIUM 7.7*  --    < > 8.1* 8.3* 8.4* 8.6* 8.5*  MG 1.3* 1.7  --  1.7  --   --   --   --   PHOS  1.9* 1.4*  --  1.8*  --   --   --   --    < > = values in this interval not displayed.   GFR: Estimated Creatinine Clearance: 71 mL/min (by C-G formula based on SCr of 0.61 mg/dL). Liver Function Tests: Recent Labs  Lab 11/14/18 0312 11/15/18 0246 11/15/18 0752 11/16/18 0827 11/17/18 1330 11/18/18 0300  AST 81* 73*  --  62* 60* 52*  ALT 35 33  --  26 26 23   ALKPHOS 68 65  --  64 65 62  BILITOT 9.1* 9.5* 9.0* 8.8* 7.8* 7.5*  PROT 6.7 6.9  --  6.0* 6.0* 5.9*  ALBUMIN 2.9* 2.7*  --  2.3* 2.2* 2.3*   No results for input(s): LIPASE, AMYLASE in the last 168 hours. Recent Labs  Lab 11/13/18 0446  AMMONIA 68*   Coagulation Profile: Recent Labs  Lab 11/13/18 0316 11/14/18 0919 11/15/18 0246 11/17/18 1330 11/18/18 1224  INR 1.6* 1.8* 1.8* 2.3* 2.1*   Cardiac Enzymes: No results for input(s): CKTOTAL, CKMB, CKMBINDEX, TROPONINI in the last 168 hours. BNP (last 3 results) No results for input(s): PROBNP in the last 8760 hours. HbA1C: No results for input(s): HGBA1C in the last 72 hours. CBG: Recent Labs  Lab 11/12/18 0758 11/12/18 1138 11/12/18 1627 11/12/18 2118 11/13/18 0739  GLUCAP 124* 122* 131* 141* 98   Lipid Profile: No results for input(s): CHOL, HDL, LDLCALC, TRIG, CHOLHDL, LDLDIRECT in the last 72 hours. Thyroid Function Tests: No results for input(s): TSH, T4TOTAL, FREET4, T3FREE, THYROIDAB in the last 72 hours. Anemia Panel: No results for input(s): VITAMINB12, FOLATE, FERRITIN, TIBC, IRON, RETICCTPCT in the last 72 hours. Sepsis Labs: Recent Labs  Lab 11/12/18 0526  PROCALCITON 0.18    Recent Results (from the past 240 hour(s))  SARS Coronavirus 2 [facility] order, Performed in [facility] hospital lab) Nasopharyngeal Nasopharyngeal Swab     Status: None   Collection Time: 11/11/18  2:26 AM   Specimen: Nasopharyngeal Swab  Result Value Ref Range Status   SARS Coronavirus 2 NEGATIVE NEGATIVE Final    Comment: (NOTE) If result is NEGATIVE  SARS-CoV-2 target nucleic acids are NOT DETECTED. The SARS-CoV-2 RNA is generally detectable in upper and lower  respiratory specimens during the acute phase of infection. The lowest  concentration of SARS-CoV-2 viral copies this assay can detect is 250  copies / mL. A negative result does not preclude SARS-CoV-2 infection  and should not be used as the sole basis for treatment or other  patient management decisions.  A negative result may occur with  improper specimen collection / handling, submission of specimen other  than nasopharyngeal swab, presence of viral mutation(s) within the  areas targeted by this assay, and inadequate number of viral copies  (<250 copies / mL). A negative result must be combined with clinical  observations, patient history, and epidemiological information. If result is POSITIVE SARS-CoV-2 target nucleic acids are DETECTED. The SARS-CoV-2 RNA is generally detectable in upper and lower  respiratory specimens dur ing the acute phase of infection.  Positive  results are indicative of active infection with SARS-CoV-2.  Clinical  correlation with patient history and other diagnostic information is  necessary to determine patient infection status.  Positive results do  not rule out bacterial infection or co-infection with other viruses. If result is PRESUMPTIVE POSTIVE SARS-CoV-2 nucleic acids MAY BE PRESENT.   A presumptive positive result was obtained on the submitted specimen  and confirmed on repeat testing.  While 2019 novel coronavirus  (SARS-CoV-2) nucleic acids may be present in the submitted sample  additional confirmatory testing may be necessary for epidemiological  and / or clinical management purposes  to differentiate between  SARS-CoV-2 and other Sarbecovirus currently known to infect humans.  If clinically indicated additional testing with an alternate test  methodology [phone]) is advised. The SARS-CoV-2 RNA is generally  detectable in upper  and lower respiratory sp ecimens during the acute  phase of infection. The expected result is Negative. Fact Sheet for Patients:  StrictlyIdeas.no Fact Sheet for Healthcare Providers: BankingDealers.co.za This test is not yet approved or cleared by the [place] FDA and has been authorized for detection and/or diagnosis of SARS-CoV-2 by FDA under an Emergency Use Authorization (EUA).  This EUA will remain in effect (meaning this test can be used) for the duration of the COVID-19 declaration under Section 564(b)(1) of the Act, 21 U.S.C. section 360bbb-3(b)(1), unless the authorization is terminated or revoked sooner. Performed at [facility], [address] [place], [place] [address]   Blood culture (routine x 2)     Status: None   Collection Time: 11/11/18  2:26 AM   Specimen: BLOOD  Result Value Ref Range Status   Specimen Description BLOOD ARTHROGRAPHIS SPECIES  Final   Special Requests   Final    BOTTLES DRAWN AEROBIC AND ANAEROBIC Blood Culture results may not be optimal due to an excessive volume of blood received in culture bottles   Culture   Final    NO GROWTH 5 DAYS Performed at [facility], [address], [place], [place] [address]    Report Status 11/16/2018 FINAL  Final  Urine Culture     Status: None   Collection Time: 11/11/18  5:35 AM   Specimen: Urine, Random  Result Value Ref Range Status   Specimen Description   Final    URINE, RANDOM Performed at [facility], [address], [place], [place] [address]    Special Requests   Final    Normal Performed at [facility], [address], [place], [place] [address]    Culture   Final    NO GROWTH Performed at [facility], [place] [address] [place], [place] [address]    Report Status 11/12/2018 FINAL  Final  MRSA PCR Screening     Status: Abnormal   Collection Time: 11/11/18  6:02 AM   Specimen: Nasal Mucosa;  Nasopharyngeal  Result Value Ref Range Status   MRSA by PCR POSITIVE (A) NEGATIVE Final    Comment:        The GeneXpert MRSA Assay (FDA approved for NASAL specimens only), is one component of a comprehensive MRSA colonization surveillance program. It is not intended to diagnose MRSA infection nor to guide or monitor treatment for MRSA infections. RESULT CALLED TO, READ BACK BY AND VERIFIED WITH: Casey Burkitt AT [id number] ON 11/11/2018 JJB Performed at [facility], [address] [place], [place] [address]   Blood culture (routine x 2)     Status: None   Collection Time: 11/11/18  6:51 AM   Specimen: BLOOD  Result Value Ref Range Status   Specimen Description BLOOD LINE  Final   Special Requests   Final    BOTTLES DRAWN AEROBIC AND ANAEROBIC Blood Culture results may not be optimal due to an excessive volume of blood received in culture bottles   Culture   Final    NO GROWTH 5 DAYS Performed at [facility], [address], [place],  [address]    Report Status 11/16/2018 FINAL  Final  Culture, respiratory     Status: None   Collection Time: 11/12/18 10:38 AM   Specimen: Tracheal Aspirate; Respiratory  Result Value Ref Range Status   Specimen Description   Final    TRACHEAL ASPIRATE Performed at [facility], [place]  [address] [place], [place] [address]    Special Requests   Final    NONE Performed at [facility], [address] [place], [place] [address]    Gram Stain   Final    MODERATE WBC PRESENT,BOTH PMN AND MONONUCLEAR NO ORGANISMS SEEN Performed at [facility], [place] [address] [place], [place] [address]    Culture FEW METHICILLIN RESISTANT STAPHYLOCOCCUS AUREUS  Final   Report Status 11/14/2018 FINAL  Final   Organism ID, Bacteria METHICILLIN RESISTANT STAPHYLOCOCCUS AUREUS  Final      Susceptibility   Methicillin resistant staphylococcus aureus - MIC*    CIPROFLOXACIN >=8 RESISTANT Resistant     ERYTHROMYCIN  >=8 RESISTANT Resistant     GENTAMICIN <=0.5 SENSITIVE Sensitive     OXACILLIN >=4 RESISTANT Resistant     TETRACYCLINE <=1 SENSITIVE Sensitive     VANCOMYCIN <=0.5 SENSITIVE Sensitive     TRIMETH/SULFA 160 RESISTANT Resistant     CLINDAMYCIN <=0.25 SENSITIVE Sensitive     RIFAMPIN <=0.5 SENSITIVE Sensitive     Inducible Clindamycin NEGATIVE Sensitive     * FEW METHICILLIN RESISTANT STAPHYLOCOCCUS AUREUS  Body fluid culture     Status: None   Collection Time: 11/13/18  3:29 PM   Specimen: [facility] Cytology Peritoneal fluid  Result Value Ref Range Status   Specimen Description   Final    PERITONEAL FLUID Performed at [facility], [place] [address], [place], [place] [address]    Special Requests   Final    NONE Performed at [facility], [place], [place] [address]    Gram Stain   Final    MODERATE WBC PRESENT,BOTH PMN AND MONONUCLEAR NO ORGANISMS SEEN    Culture   Final    NO GROWTH 3 DAYS Performed at [facility], [place] [address], [place],  [address]    Report Status 11/17/2018 FINAL  Final         Radiology Studies: No results found.      Scheduled Meds: . Chlorhexidine Gluconate Cloth  6 each Topical Q0600  . Chlorhexidine Gluconate Cloth  6 each Topical Daily  . feeding supplement (ENSURE ENLIVE)  237 mL Oral BID BM  . folic acid  1 mg Oral Daily  . lactulose  10 g Oral BID  . mouth rinse  15 mL Mouth Rinse BID  . multivitamin with minerals  1 tablet Oral Daily  . mupirocin ointment   Nasal BID  . pantoprazole (PROTONIX) IV  40 mg Intravenous Q12H  . phytonadione  10 mg Oral Once  . rifaximin  550 mg Oral BID  . thiamine  100 mg Oral Daily   Continuous Infusions: . sodium chloride 10 mL/hr at 11/16/18 1400  . cefTRIAXone (ROCEPHIN)  IV 1 g (11/18/18 1054)  . octreotide  (SANDOSTATIN)    IV infusion 50 mcg/hr (11/18/18 0607)     LOS: 5 days    Time spent: 35 minutes.     Elmarie Shiley, MD [facility] Pager [phone]  If 7PM-7AM, please contact night-coverage [email or web address] Password [facility] 11/18/2018, 2:43 PM

## 2018-11-18 NOTE — Progress Notes (Signed)
Lennette Bihari, RN from [facility] transfer center called to get up date.  Gave last vital sign information, continuous medications, and current labs.  At this time, patient still on waiting list, bed is not available at this time per Lennette Bihari. Will follow up in the morning to relay additional information regarding transfer of care.

## 2018-11-19 LAB — COMPREHENSIVE METABOLIC PANEL
ALT: 22 U/L (ref 0–44)
AST: 51 U/L — ABNORMAL HIGH (ref 15–41)
Albumin: 2.3 g/dL — ABNORMAL LOW (ref 3.5–5.0)
Alkaline Phosphatase: 61 U/L (ref 38–126)
Anion gap: 8 (ref 5–15)
BUN: <5 mg/dL — ABNORMAL LOW (ref 6–20)
CO2: 24 mmol/L (ref 22–32)
Calcium: 8.7 mg/dL — ABNORMAL LOW (ref 8.9–10.3)
Chloride: 103 mmol/L (ref 98–111)
Creatinine, Ser: 0.59 mg/dL (ref 0.44–1.00)
GFR calc Af Amer: >60 mL/min (ref >60)
GFR calc non Af Amer: >60 mL/min (ref >60)
Glucose, Bld: 120 mg/dL — ABNORMAL HIGH (ref 70–99)
Potassium: 3.6 mmol/L (ref 3.5–5.1)
Sodium: 135 mmol/L (ref 135–145)
Total Bilirubin: 7.7 mg/dL — ABNORMAL HIGH (ref 0.3–1.2)
Total Protein: 5.9 g/dL — ABNORMAL LOW (ref 6.5–8.1)

## 2018-11-19 LAB — CBC
HCT: 37.9 % (ref 36.0–46.0)
Hemoglobin: 12.5 g/dL (ref 12.0–15.0)
MCH: 30.8 pg (ref 26.0–34.0)
MCHC: 33 g/dL (ref 30.0–36.0)
MCV: 93.3 fL (ref 80.0–100.0)
Platelets: 78 10*3/uL — ABNORMAL LOW (ref 150–400)
RBC: 4.06 MIL/uL (ref 3.87–5.11)
RDW: 18.9 % — ABNORMAL HIGH (ref 11.5–15.5)
WBC: 5.6 10*3/uL (ref 4.0–10.5)
nRBC: 0 % (ref 0.0–0.2)

## 2018-11-19 LAB — PROTIME-INR
INR: 2.1 — ABNORMAL HIGH (ref 0.8–1.2)
Prothrombin Time: 23.1 seconds — ABNORMAL HIGH (ref 11.4–15.2)

## 2018-11-19 MED ORDER — VITAMIN K1 10 MG/ML IJ SOLN
10.0000 mg | Freq: Once | INTRAVENOUS | Status: AC
  Administered 2018-11-19: 14:00:00 10 mg via INTRAVENOUS
  Filled 2018-11-19: qty 1

## 2018-11-19 MED ORDER — LORAZEPAM 0.5 MG PO TABS
0.5000 mg | ORAL_TABLET | Freq: Once | ORAL | Status: AC
  Administered 2018-11-19: 06:00:00 0.5 mg via ORAL
  Filled 2018-11-19: qty 1

## 2018-11-19 NOTE — Progress Notes (Signed)
PROGRESS NOTE    Lisa Dennis  [id number] DOB: Aug 27, 1965 DOA: 11/13/2018 PCP: Pccm, Md, MD   Brief Narrative: 53 year old female with past medical history significant for alcohol abuse and alcoholic liver cirrhosis, was admitted to [facility] on 8/19 with loss of consciousness and hemorrhagic shock, hemoglobin of 3.8, she received 8 units of packed red blood cell, 4 units of FFP and 2 units of platelets, subsequently had endoscopy which showed grade 1 varices but no evidence of fresh blood in the stomach. She underwent a paracentesis which revealed hemorrhagic peritoneal fluid.  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  These very large varices felt to be high risk for continued bleeding.   Subsequently hemoglobin has remained stable she was transferred to ICU at [facility]  for IR evaluation for TIPS, Seen  by GI as well, transfer from [facility] to [facility] on 8/23. IR and GI recommending transfer to tertiary center.  Dr. Broadus John  spoke with [facility] and [facility] 8/23, no bed at either placed on waiting list for the [facility] transfer.    Assessment & Plan:   Active Problems:   GI bleeding   Hepatic encephalopathy (HCC)   Alcoholic cirrhosis of liver with ascites (HCC)   Hemorrhage, intraperitoneal   Intra-abdominal varices   Bleeding behind the abdominal cavity   SBP (spontaneous bacterial peritonitis) ([place])   1-Hemorrhagic shock Due to intraperitoneal bleed from massive portosystemic shunt, intraperitoneal ascending colonic diuresis. 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  These very large varices felt to be high risk for continued bleed. -Hemoglobin has remained stable she continues to be on octreotide -IR and GI has been following. -TIPS felt to be very high risk in the setting of decompensated cirrhosis, bilirubin of 9. -IR and GI recommended transfer to tertiary center, Dr. Broadus John: [facility] transfer was denied.  She called  [facility] discussed case with Dr. Edison Pace Hepatology  and Dr. Quillian Quince no beds available but patient is on the waiting list for stepdown bed. -Continue with ceftriaxone. -Hemoglobin is stable, INR at 2.1 again. Discussed with GI will give vitamin K IV for 2 days.    2-Alcoholic  cirrhosis decompensated Diuretics on hold AST is stable at 52, ALT 23, total bilirubin is stable at 7.5 today. INR still at 2.1. will give a dose of vitamin K IV.   3-hepatic encephalopathy: -Mentation has improved.  Alert and oriented no asterixis.  Continue with lactulose and rifaximin  4-history of alcohol abuse: No evidence of withdrawal Stop drinking about a month ago. Chronic thrombocytopenia: Stable           Estimated body mass index is 23 kg/m as calculated from the following:   Height as of this encounter: 5\' 4"  (1.626 m).   Weight as of this encounter: 60.8 kg.   DVT prophylaxis: SCDs Code Status: Full code Family Communication: Care discussed with patient Disposition Plan: Waiting bed at  [facility] Consultants:   IR  GI  Procedures:   paracentesis  Antimicrobials:    Subjective: Denies worsening abdominal pain, denies bloody stool  Objective: Vitals:   11/18/18 0547 11/18/18 1946 11/18/18 2223 11/19/18 0303  BP: 139/82  125/73 131/81  Pulse: 82 84 79 75  Resp: (!) 21 19 18 19   Temp: 99.6 F (37.6 C)  98.8 F (37.1 C) 99 F (37.2 C)  TempSrc: Oral  Oral Oral  SpO2: 92% 96% 96% 95%  Weight: 61.9 kg   60.8 kg  Height:        Intake/Output Summary (Last 24  hours) at 11/19/2018 1006 Last data filed at 11/18/2018 1630 Gross per 24 hour  Intake 1217.31 ml  Output 150 ml  Net 1067.31 ml   Filed Weights   11/17/18 0600 11/18/18 0547 11/19/18 0303  Weight: 64.5 kg 61.9 kg 60.8 kg    Examination:  General exam: NAD Respiratory system: CTA Cardiovascular system: S 1, S 2 RRR Gastrointestinal system:  BS present, soft, nt Central nervous system: non focal.  Extremities: symmetric power.  Skin: no rashes  Data Reviewed: I have personally reviewed following labs and imaging studies  CBC: Recent Labs  Lab 11/12/18 1853  11/13/18 1810  11/15/18 0752 11/16/18 0827 11/17/18 1330 11/18/18 0300 11/19/18 0316  WBC 8.5   < > 7.2   < > 6.8 7.3 6.6 6.2 5.6  NEUTROABS 7.2  --  5.2  --   --   --   --   --   --   HGB 10.1*   < > 10.1*   < > 11.1* 12.5 13.4 13.2 12.5  HCT 29.8*   < > 31.5*   < > 33.7* 37.5 40.5 40.2 37.9  MCV 90.0   < > 94.6   < > 93.4 93.5 94.2 93.9 93.3  PLT 28*   < > 59*   < > 63* 60* 73* 69* 78*   < > = values in this interval not displayed.   Basic Metabolic Panel: Recent Labs  Lab 11/13/18 0316 11/13/18 1810  11/14/18 0312 11/15/18 0246 11/16/18 0827 11/17/18 1330 11/18/18 0300 11/19/18 0316  NA 139  --    < > 136 136 136 135 133* 135  K 3.2* 3.7   < > 3.8 3.8 3.3* 3.6 3.6 3.6  CL 108  --    < > 105 104 103 103 101 103  CO2 25  --    < > 23 22 23  21* 22 24  GLUCOSE 120*  --    < > 104* 108* 129* 139* 100* 120*  BUN 13  --    < > 7 7 5* 6 7 <5*  CREATININE 0.41*  --    < > 0.49 0.56 0.52 0.55 0.61 0.59  CALCIUM 7.7*  --    < > 8.1* 8.3* 8.4* 8.6* 8.5* 8.7*  MG 1.3* 1.7  --  1.7  --   --   --   --   --   PHOS 1.9* 1.4*  --  1.8*  --   --   --   --   --    < > = values in this interval not displayed.   GFR: Estimated Creatinine Clearance: 71 mL/min (by C-G formula based on SCr of 0.59 mg/dL). Liver Function Tests: Recent Labs  Lab 11/15/18 0246 11/15/18 0752 11/16/18 0827 11/17/18 1330 11/18/18 0300  11/19/18 0316  AST 73*  --  62* 60* 52* 51*  ALT 33  --  26 26 23 22   ALKPHOS 65  --  64 65 62 61  BILITOT 9.5* 9.0* 8.8*  7.8* 7.5* 7.7*  PROT 6.9  --  6.0* 6.0* 5.9* 5.9*  ALBUMIN 2.7*  --  2.3* 2.2* 2.3* 2.3*   No results for input(s): LIPASE, AMYLASE in the last 168 hours. Recent Labs  Lab 11/13/18 0446  AMMONIA 68*   Coagulation Profile: Recent Labs  Lab 11/14/18 0919 11/15/18 0246 11/17/18 1330 11/18/18 1224 11/19/18 0316  INR 1.8* 1.8* 2.3* 2.1* 2.1*   Cardiac Enzymes: No results for input(s): CKTOTAL, CKMB, CKMBINDEX, TROPONINI in the last 168 hours. BNP (last 3 results) No results for input(s): PROBNP in the last 8760 hours. HbA1C: No results for input(s): HGBA1C in the last 72 hours. CBG: Recent Labs  Lab 11/12/18 1138 11/12/18 1627 11/12/18 2118 11/13/18 0739  GLUCAP 122* 131* 141* 98   Lipid Profile: No results for input(s): CHOL, HDL, LDLCALC, TRIG, CHOLHDL, LDLDIRECT in the last 72 hours. Thyroid Function Tests: No results for input(s): TSH, T4TOTAL, FREET4, T3FREE, THYROIDAB in the last 72 hours. Anemia Panel: No results for input(s): VITAMINB12, FOLATE, FERRITIN, TIBC, IRON, RETICCTPCT in the last 72 hours. Sepsis Labs: No results for input(s): PROCALCITON, LATICACIDVEN in the last 168 hours.  Recent Results (from the past 240 hour(s))  SARS Coronavirus 2 [facility] order, Performed in [facility] hospital lab) Nasopharyngeal Nasopharyngeal Swab     Status: None   Collection Time: 11/11/18  2:26 AM   Specimen: Nasopharyngeal Swab  Result Value Ref Range Status   SARS Coronavirus 2 NEGATIVE NEGATIVE Final    Comment: (NOTE) If result is NEGATIVE SARS-CoV-2 target nucleic acids are NOT DETECTED. The SARS-CoV-2 RNA is generally detectable in upper and lower  respiratory specimens during the acute phase of infection. The lowest  concentration of SARS-CoV-2 viral copies this assay can detect is 250  copies / mL. A negative result does not preclude  SARS-CoV-2 infection  and should not be used as the sole basis for treatment or other  patient management decisions.  A negative result may occur with  improper specimen collection / handling, submission of specimen other  than nasopharyngeal swab, presence of viral mutation(s) within the  areas targeted by this assay, and inadequate number of viral copies  (<250 copies / mL). A negative result must be combined with clinical  observations, patient history, and epidemiological information. If result is POSITIVE SARS-CoV-2 target nucleic acids are DETECTED. The SARS-CoV-2 RNA is generally detectable in upper and lower  respiratory specimens dur ing the acute phase of infection.  Positive  results are indicative of active infection with SARS-CoV-2.  Clinical  correlation with patient history and other diagnostic information is  necessary to determine patient infection status.  Positive results do  not rule out bacterial infection or co-infection with other viruses. If result is PRESUMPTIVE POSTIVE SARS-CoV-2 nucleic acids MAY BE PRESENT.   A presumptive positive result was obtained on the submitted specimen  and confirmed on repeat testing.  While 2019 novel coronavirus  (SARS-CoV-2) nucleic acids may be present in the submitted sample  additional confirmatory testing may be necessary for epidemiological  and / or clinical management purposes  to differentiate between  SARS-CoV-2 and other Sarbecovirus currently known to infect humans.  If clinically indicated additional testing with an alternate test  methodology [phone]) is advised. The SARS-CoV-2 RNA is generally  detectable in upper and lower respiratory sp ecimens during the acute  phase of infection. The expected result is Negative. Fact Sheet for Patients:  StrictlyIdeas.no Fact Sheet for Healthcare Providers: BankingDealers.co.za This test is not yet  approved or cleared by the  [place] and has been authorized for detection and/or diagnosis of SARS-CoV-2 by FDA under an Emergency Use Authorization (EUA).  This EUA will remain in effect (meaning this test can be used) for the duration of the COVID-19 declaration under Section 564(b)(1) of the Act, 21 U.S.C. section 360bbb-3(b)(1), unless the authorization is terminated or revoked sooner. Performed at [facility], [address] [place], [place] [address]   Blood culture (routine x 2)     Status: None   Collection Time: 11/11/18  2:26 AM   Specimen: BLOOD  Result Value Ref Range Status   Specimen Description BLOOD ARTHROGRAPHIS SPECIES  Final   Special Requests   Final    BOTTLES DRAWN AEROBIC AND ANAEROBIC Blood Culture results may not be optimal due to an excessive volume of blood received in culture bottles   Culture   Final    NO GROWTH 5 DAYS Performed at [facility], [address], [place], [place] [address]    Report Status 11/16/2018 FINAL  Final  Urine Culture     Status: None   Collection Time: 11/11/18  5:35 AM   Specimen: Urine, Random  Result Value Ref Range Status   Specimen Description   Final    URINE, RANDOM Performed at [facility], [address] [place], [place] [address]    Special Requests   Final    Normal Performed at [facility], [address], [place], [place] [address]    Culture   Final    NO GROWTH Performed at [facility], [place] [address], [place], [place] [address]    Report Status 11/12/2018 FINAL  Final  MRSA PCR Screening     Status: Abnormal   Collection Time: 11/11/18  6:02 AM   Specimen: Nasal Mucosa; Nasopharyngeal  Result Value Ref Range Status   MRSA by PCR POSITIVE (A) NEGATIVE Final    Comment:        The GeneXpert MRSA Assay (FDA approved for NASAL specimens only), is one component of a comprehensive MRSA colonization surveillance program. It is not intended to diagnose MRSA infection  nor to guide or monitor treatment for MRSA infections. RESULT CALLED TO, READ BACK BY AND VERIFIED WITH: Casey Burkitt AT [id number] ON 11/11/2018 JJB Performed at [facility], [address] [place], [place] [address]   Blood culture (routine x 2)     Status: None   Collection Time: 11/11/18  6:51 AM   Specimen: BLOOD  Result Value Ref Range Status   Specimen Description BLOOD LINE  Final   Special Requests   Final    BOTTLES DRAWN AEROBIC AND ANAEROBIC Blood Culture results may not be optimal due to an excessive volume of blood received in culture bottles   Culture   Final    NO GROWTH 5 DAYS Performed at [facility], [address], [place], [place] [address]    Report Status 11/16/2018 FINAL  Final  Culture, respiratory     Status: None   Collection Time: 11/12/18 10:38 AM   Specimen: Tracheal Aspirate; Respiratory  Result Value Ref Range Status   Specimen Description   Final    TRACHEAL ASPIRATE Performed at [facility], [address] [place], [place] [address]    Special Requests   Final    NONE Performed at [facility], [address] [place], [place] [address]    Gram Stain   Final  MODERATE WBC PRESENT,BOTH PMN AND MONONUCLEAR NO ORGANISMS SEEN Performed at [facility], [place] [address], [place], [place] [address]    Culture FEW METHICILLIN RESISTANT STAPHYLOCOCCUS AUREUS  Final   Report Status 11/14/2018 FINAL  Final   Organism ID, Bacteria METHICILLIN RESISTANT STAPHYLOCOCCUS AUREUS  Final      Susceptibility   Methicillin resistant staphylococcus aureus - MIC*    CIPROFLOXACIN >=8 RESISTANT Resistant     ERYTHROMYCIN >=8 RESISTANT Resistant     GENTAMICIN <=0.5 SENSITIVE Sensitive     OXACILLIN >=4 RESISTANT Resistant     TETRACYCLINE <=1 SENSITIVE Sensitive     VANCOMYCIN <=0.5 SENSITIVE Sensitive     TRIMETH/SULFA 160 RESISTANT Resistant     CLINDAMYCIN <=0.25 SENSITIVE Sensitive     RIFAMPIN <=0.5  SENSITIVE Sensitive     Inducible Clindamycin NEGATIVE Sensitive     * FEW METHICILLIN RESISTANT STAPHYLOCOCCUS AUREUS  Body fluid culture     Status: None   Collection Time: 11/13/18  3:29 PM   Specimen: [facility] Cytology Peritoneal fluid  Result Value Ref Range Status   Specimen Description   Final    PERITONEAL FLUID Performed at [facility], [place] [address], [place], [place] [address]    Special Requests   Final    NONE Performed at [facility], [place], [place] [address]    Gram Stain   Final    MODERATE WBC PRESENT,BOTH PMN AND MONONUCLEAR NO ORGANISMS SEEN    Culture   Final    NO GROWTH 3 DAYS Performed at [facility], [place] [address], [place], [place] [address]    Report Status 11/17/2018 FINAL  Final         Radiology Studies: No results found.      Scheduled Meds:  Chlorhexidine Gluconate Cloth  6 each Topical Q0600   Chlorhexidine Gluconate Cloth  6 each Topical Daily   feeding supplement (ENSURE ENLIVE)  237 mL Oral BID BM   folic acid  1 mg Oral Daily   lactulose  10 g Oral BID   mouth rinse  15 mL Mouth Rinse BID   multivitamin with minerals  1 tablet Oral Daily   mupirocin ointment   Nasal BID   pantoprazole (PROTONIX) IV  40 mg Intravenous Q12H   rifaximin  550 mg Oral BID   thiamine  100 mg Oral Daily   Continuous Infusions:  sodium chloride 10 mL/hr at 11/18/18 1500   cefTRIAXone (ROCEPHIN)  IV Stopped (11/18/18 1125)   octreotide  (SANDOSTATIN)    IV infusion 50 mcg/hr (11/19/18 0215)     LOS: 6 days    Time spent: 35 minutes.     Elmarie Shiley, MD [facility] Pager [phone]  If 7PM-7AM, please contact night-coverage [email or web address] Password [email or web address] 11/19/2018, 10:06 AM

## 2018-11-20 LAB — COMPREHENSIVE METABOLIC PANEL
ALT: 30 U/L (ref 0–44)
AST: 61 U/L — ABNORMAL HIGH (ref 15–41)
Albumin: 2.4 g/dL — ABNORMAL LOW (ref 3.5–5.0)
Alkaline Phosphatase: 64 U/L (ref 38–126)
Anion gap: 9 (ref 5–15)
BUN: <5 mg/dL — ABNORMAL LOW (ref 6–20)
CO2: 25 mmol/L (ref 22–32)
Calcium: 8.9 mg/dL (ref 8.9–10.3)
Chloride: 100 mmol/L (ref 98–111)
Creatinine, Ser: 0.63 mg/dL (ref 0.44–1.00)
GFR calc Af Amer: >60 mL/min (ref >60)
GFR calc non Af Amer: >60 mL/min (ref >60)
Glucose, Bld: 137 mg/dL — ABNORMAL HIGH (ref 70–99)
Potassium: 3.4 mmol/L — ABNORMAL LOW (ref 3.5–5.1)
Sodium: 134 mmol/L — ABNORMAL LOW (ref 135–145)
Total Bilirubin: 7.7 mg/dL — ABNORMAL HIGH (ref 0.3–1.2)
Total Protein: 6.5 g/dL (ref 6.5–8.1)

## 2018-11-20 LAB — CBC
HCT: 40.5 % (ref 36.0–46.0)
Hemoglobin: 13.4 g/dL (ref 12.0–15.0)
MCH: 31.3 pg (ref 26.0–34.0)
MCHC: 33.1 g/dL (ref 30.0–36.0)
MCV: 94.6 fL (ref 80.0–100.0)
Platelets: 77 10*3/uL — ABNORMAL LOW (ref 150–400)
RBC: 4.28 MIL/uL (ref 3.87–5.11)
RDW: 18.9 % — ABNORMAL HIGH (ref 11.5–15.5)
WBC: 5.3 10*3/uL (ref 4.0–10.5)
nRBC: 0 % (ref 0.0–0.2)

## 2018-11-20 LAB — PROTIME-INR
INR: 2 — ABNORMAL HIGH (ref 0.8–1.2)
Prothrombin Time: 22.6 seconds — ABNORMAL HIGH (ref 11.4–15.2)

## 2018-11-20 MED ORDER — VITAMIN K1 10 MG/ML IJ SOLN
10.0000 mg | Freq: Once | INTRAVENOUS | Status: AC
  Administered 2018-11-20: 13:00:00 10 mg via INTRAVENOUS
  Filled 2018-11-20: qty 1

## 2018-11-20 MED ORDER — LORAZEPAM 0.5 MG PO TABS
0.5000 mg | ORAL_TABLET | Freq: Once | ORAL | Status: AC
  Administered 2018-11-20: 0.5 mg via ORAL
  Filled 2018-11-20: qty 1

## 2018-11-20 MED ORDER — POTASSIUM CHLORIDE CRYS ER 20 MEQ PO TBCR
40.0000 meq | EXTENDED_RELEASE_TABLET | Freq: Once | ORAL | Status: AC
  Administered 2018-11-20: 40 meq via ORAL
  Filled 2018-11-20: qty 2

## 2018-11-20 MED ORDER — LORAZEPAM 2 MG/ML IJ SOLN
0.5000 mg | Freq: Every day | INTRAMUSCULAR | Status: DC | PRN
  Administered 2018-11-20 – 2018-11-23 (×4): 0.5 mg via INTRAVENOUS
  Filled 2018-11-20 (×4): qty 1

## 2018-11-20 NOTE — Progress Notes (Signed)
Pt is now requesting that no more information be given out to any family member. Dr. Tyrell Antonio aware. Carroll Kinds RN

## 2018-11-20 NOTE — Progress Notes (Signed)
PROGRESS NOTE    Lisa Dennis  [id number] DOB: 08/04/1965 DOA: 11/13/2018 PCP: Pccm, Md, MD   Brief Narrative: 53 year old female with past medical history significant for alcohol abuse and alcoholic liver cirrhosis, was admitted to [facility] on 8/19 with loss of consciousness and hemorrhagic shock, hemoglobin of 3.8, she received 8 units of packed red blood cell, 4 units of FFP and 2 units of platelets, subsequently had endoscopy which showed grade 1 varices but no evidence of fresh blood in the stomach. She underwent a paracentesis which revealed hemorrhagic peritoneal fluid.  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  These very large varices felt to be high risk for continued bleeding.   Subsequently hemoglobin has remained stable she was transferred to ICU at [facility]  for IR evaluation for TIPS, Seen  by GI as well, transfer from [facility] to [facility] on 8/23. IR and GI recommending transfer to tertiary center.  Dr. Broadus John  spoke with [facility] and [facility] 8/23, no bed at either placed on waiting list for the [facility] transfer.    Assessment & Plan:   Active Problems:   GI bleeding   Hepatic encephalopathy (HCC)   Alcoholic cirrhosis of liver with ascites (HCC)   Hemorrhage, intraperitoneal   Intra-abdominal varices   Bleeding behind the abdominal cavity   SBP (spontaneous bacterial peritonitis) ([place])   1-Hemorrhagic shock Due to intraperitoneal bleed from massive portosystemic shunt, intraperitoneal ascending colonic diuresis. 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  These very large varices felt to be high risk for continued bleed. -Hemoglobin has remained stable she continues to be on octreotide -IR and GI has been following. -TIPS felt to be very high risk in the setting of decompensated cirrhosis, bilirubin of 9. -IR and GI recommended transfer to tertiary center, Dr. Broadus John: [facility] transfer was denied.  She called  [facility] discussed case with Dr. Edison Pace Hepatology  and Dr. Quillian Quince no beds available but patient is on the waiting list for stepdown bed. -Continue with ceftriaxone. -Hemoglobin is stable, INR at 2.1 again. Discussed with GI will give vitamin K IV for 3 days.  Stable. I called [facility], no bed available yet.   2-Alcoholic  cirrhosis decompensated Diuretics on hold AST is stable at 52, ALT 23, total bilirubin is stable at 7.5 today. INR still at 2.1. will give a dose of vitamin K IV.   3-hepatic encephalopathy: Alert and oriented no asterixis.  Continue with lactulose and rifaximin Resolved.  Patient is alert and oriented x3 she is able to tell me why she is in the hospital.  She does not want to give information to her family.   4-history of alcohol abuse: No evidence of withdrawal Stop drinking about a month ago. Chronic thrombocytopenia: Stable           Estimated body mass index is 23.46 kg/m as calculated from the following:   Height as of this encounter: 5\' 4"  (1.626 m).   Weight as of this encounter: 62 kg.   DVT prophylaxis: SCDs Code Status: Full code Family Communication:  Care discussed with patient Disposition Plan: Waiting bed at  [facility] Consultants:   IR  GI  Procedures:   paracentesis  Antimicrobials:    Subjective: He is feeling anxious, Ativan low-dose helps.  Does not make her sleepy or lethargic.  She denies blood in the stool.  Objective: Vitals:   11/19/18 1942 11/19/18 1945 11/20/18 0400 11/20/18 0626  BP: 137/73   (!) 152/84    Pulse: 65   63  Resp:   19   Temp:  99.2 F (37.3 C) 99.7 F (37.6 C) 98.6 F (37 C)  TempSrc:  Oral Oral Oral  SpO2: 94%   96%  Weight:    62 kg  Height:        Intake/Output Summary (Last 24 hours) at 11/20/2018 1618 Last data filed at 11/20/2018 1500 Gross per 24 hour  Intake 240 ml  Output 350 ml  Net -110 ml   Filed Weights   11/18/18 0547 11/19/18 0303 11/20/18 0626  Weight: 61.9 kg 60.8 kg 62 kg    Examination:  General exam: NAD Respiratory system: CTA Cardiovascular system: S 1, S 2 RRR Gastrointestinal system: Bowel sounds present, soft nontender nondistended Central nervous system: Nonfocal alert and oriented x3 Extremities: Symmetric  power Skin: No rashes  Data Reviewed: I have personally reviewed following labs and imaging studies  CBC: Recent Labs  Lab 11/13/18 1810  11/16/18 0827 11/17/18 1330 11/18/18 0300 11/19/18 0316 11/20/18 0836  WBC 7.2   < > 7.3 6.6 6.2 5.6 5.3  NEUTROABS 5.2  --   --   --   --   --   --   HGB 10.1*   < > 12.5 13.4 13.2 12.5 13.4  HCT 31.5*   < > 37.5 40.5 40.2 37.9 40.5  MCV 94.6   < > 93.5 94.2 93.9 93.3 94.6  PLT 59*   < > 60* 73* 69* 78* 77*   < > = values in this interval not displayed.   Basic Metabolic Panel: Recent Labs  Lab 11/13/18 1810  11/14/18 0312  11/16/18 0827 11/17/18 1330 11/18/18 0300 11/19/18 0316 11/20/18 0836  NA  --    < > 136   < > 136 135 133* 135 134*  K 3.7   < > 3.8   < > 3.3* 3.6 3.6 3.6 3.4*  CL  --    < > 105   < > 103 103 101 103 100  CO2  --    < > 23   < > 23 21* 22 24 25   GLUCOSE  --    < > 104*   < > 129* 139* 100* 120* 137*  BUN  --    < > 7   < > 5* 6 7 <5* <5*  CREATININE  --    < > 0.49   < > 0.52 0.55 0.61 0.59 0.63  CALCIUM  --    < > 8.1*   < > 8.4* 8.6* 8.5* 8.7* 8.9  MG 1.7  --  1.7  --   --   --   --   --   --   PHOS 1.4*  --  1.8*  --   --   --   --   --   --    < > = values in this interval not displayed.   GFR: Estimated Creatinine Clearance: 71 mL/min  (by C-G formula based on SCr of 0.63 mg/dL). Liver Function Tests: Recent Labs  Lab 11/16/18 0827 11/17/18 1330 11/18/18 0300 11/19/18 0316 11/20/18 0836  AST 62* 60* 52* 51* 61*  ALT 26 26 23 22 30   ALKPHOS 64 65 62 61 64  BILITOT 8.8* 7.8* 7.5* 7.7* 7.7*  PROT 6.0* 6.0* 5.9* 5.9* 6.5  ALBUMIN 2.3* 2.2* 2.3* 2.3* 2.4*   No results for input(s): LIPASE, AMYLASE in the last 168 hours. No results for input(s): AMMONIA in the last 168 hours. Coagulation Profile: Recent Labs  Lab 11/15/18 0246 11/17/18 1330 11/18/18 1224 11/19/18 0316 11/20/18 0836  INR 1.8* 2.3* 2.1* 2.1* 2.0*   Cardiac Enzymes: No results for input(s): CKTOTAL, CKMB, CKMBINDEX, TROPONINI in the last 168 hours. BNP (last 3 results) No results for input(s): PROBNP in the last 8760 hours. HbA1C: No results for input(s): HGBA1C in the last 72 hours. CBG: No results for input(s): GLUCAP in the last 168 hours. Lipid Profile: No results for input(s): CHOL, HDL, LDLCALC, TRIG, CHOLHDL, LDLDIRECT in the last 72 hours. Thyroid Function Tests: No results for input(s): TSH, T4TOTAL, FREET4, T3FREE, THYROIDAB in the last 72 hours. Anemia Panel: No results for input(s): VITAMINB12, FOLATE, FERRITIN, TIBC, IRON, RETICCTPCT in the last 72 hours. Sepsis Labs: No results for input(s): PROCALCITON, LATICACIDVEN in the last 168 hours.  Recent Results (from the past 240 hour(s))  SARS Coronavirus 2 [facility] order, Performed in [facility] hospital lab) Nasopharyngeal Nasopharyngeal Swab     Status: None   Collection Time: 11/11/18  2:26 AM   Specimen: Nasopharyngeal Swab  Result Value Ref Range Status   SARS Coronavirus 2 NEGATIVE NEGATIVE Final    Comment: (NOTE) If result is NEGATIVE SARS-CoV-2 target nucleic acids are NOT DETECTED. The SARS-CoV-2 RNA is generally detectable in upper and lower  respiratory specimens during the acute phase of infection. The lowest  concentration of SARS-CoV-2 viral copies this  assay can detect is 250  copies / mL. A negative result does not preclude SARS-CoV-2 infection  and should not be used as the sole basis for treatment or other  patient management decisions.  A negative result may occur with  improper specimen collection / handling, submission of specimen other  than nasopharyngeal swab, presence of viral mutation(s) within the  areas targeted by this assay, and inadequate number of viral copies  (<250 copies / mL). A negative result must be combined with clinical  observations, patient history, and epidemiological information. If result is POSITIVE SARS-CoV-2 target nucleic acids are DETECTED. The SARS-CoV-2 RNA is generally detectable in upper and lower  respiratory specimens dur ing the acute phase of infection.  Positive  results are indicative of active infection with SARS-CoV-2.  Clinical  correlation with patient history and other diagnostic information is  necessary to determine patient infection status.  Positive results do  not rule out bacterial infection or co-infection with other viruses. If result is PRESUMPTIVE POSTIVE SARS-CoV-2 nucleic acids MAY BE PRESENT.   A presumptive positive result was obtained on the submitted specimen  and confirmed on repeat testing.  While 2019 novel coronavirus  (SARS-CoV-2) nucleic acids may be present in the submitted sample  additional confirmatory testing may be necessary for epidemiological  and / or clinical management purposes  to differentiate between  SARS-CoV-2 and other Sarbecovirus currently known to infect humans.  If clinically indicated additional testing with an alternate test  methodology [phone]) is advised. The SARS-CoV-2 RNA is generally  detectable in upper and lower respiratory sp ecimens during the acute  phase of infection. The expected result  is Negative. Fact Sheet for Patients:  StrictlyIdeas.no Fact Sheet for Healthcare  Providers: BankingDealers.co.za This test is not yet approved or cleared by the [place] FDA and has been authorized for detection and/or diagnosis of SARS-CoV-2 by FDA under an Emergency Use Authorization (EUA).  This EUA will remain in effect (meaning this test can be used) for the duration of the COVID-19 declaration under Section 564(b)(1) of the Act, 21 U.S.C. section 360bbb-3(b)(1), unless the authorization is terminated or revoked sooner. Performed at [facility], [address] [place], [place] [address]   Blood culture (routine x 2)     Status: None   Collection Time: 11/11/18  2:26 AM   Specimen: BLOOD  Result Value Ref Range Status   Specimen Description BLOOD ARTHROGRAPHIS SPECIES  Final   Special Requests   Final    BOTTLES DRAWN AEROBIC AND ANAEROBIC Blood Culture results may not be optimal due to an excessive volume of blood received in culture bottles   Culture   Final    NO GROWTH 5 DAYS Performed at [facility], [address] [place], [place] [address]    Report Status 11/16/2018 FINAL  Final  Urine Culture     Status: None   Collection Time: 11/11/18  5:35 AM   Specimen: Urine, Random  Result Value Ref Range Status   Specimen Description   Final    URINE, RANDOM Performed at [facility], [address] [place], [place] [address]    Special Requests   Final    Normal Performed at [facility], [address], [place], [place] [address]    Culture   Final    NO GROWTH Performed at [facility], [place] [address], [place], [place] [address]    Report Status 11/12/2018 FINAL  Final  MRSA PCR Screening     Status: Abnormal   Collection Time: 11/11/18  6:02 AM   Specimen: Nasal Mucosa; Nasopharyngeal  Result Value Ref Range Status   MRSA by PCR POSITIVE (A) NEGATIVE Final    Comment:        The GeneXpert MRSA Assay (FDA approved for NASAL specimens only), is one component of  a comprehensive MRSA colonization surveillance program. It is not intended to diagnose MRSA infection nor to guide or monitor treatment for MRSA infections. RESULT CALLED TO, READ BACK BY AND VERIFIED WITH: Casey Burkitt AT [id number] ON 11/11/2018 JJB Performed at [facility], [address] [place], [place] [address]   Blood culture (routine x 2)     Status: None   Collection Time: 11/11/18  6:51 AM   Specimen: BLOOD  Result Value Ref Range Status   Specimen Description BLOOD LINE  Final   Special Requests   Final    BOTTLES DRAWN AEROBIC AND ANAEROBIC Blood Culture results may not be optimal due to an excessive volume of blood received in culture bottles   Culture   Final    NO GROWTH 5 DAYS Performed at [facility], [address], [place], [place] [address]    Report Status 11/16/2018 FINAL  Final  Culture, respiratory     Status: None   Collection Time: 11/12/18 10:38 AM   Specimen: Tracheal Aspirate; Respiratory  Result Value Ref Range Status   Specimen Description   Final    TRACHEAL ASPIRATE Performed at [facility], [address] [place] [address] [place], [place] [address]    Special Requests   Final    NONE Performed  at [facility], [address] [place], [place] [address]    Gram Stain   Final    MODERATE WBC PRESENT,BOTH PMN AND MONONUCLEAR NO ORGANISMS SEEN Performed at [facility], [place] [address], [place], [place] [address]    Culture FEW METHICILLIN RESISTANT STAPHYLOCOCCUS AUREUS  Final   Report Status 11/14/2018 FINAL  Final   Organism ID, Bacteria METHICILLIN RESISTANT STAPHYLOCOCCUS AUREUS  Final      Susceptibility   Methicillin resistant staphylococcus aureus - MIC*    CIPROFLOXACIN >=8 RESISTANT Resistant     ERYTHROMYCIN >=8 RESISTANT Resistant     GENTAMICIN <=0.5 SENSITIVE Sensitive     OXACILLIN >=4 RESISTANT Resistant     TETRACYCLINE <=1 SENSITIVE Sensitive     VANCOMYCIN <=0.5 SENSITIVE Sensitive      TRIMETH/SULFA 160 RESISTANT Resistant     CLINDAMYCIN <=0.25 SENSITIVE Sensitive     RIFAMPIN <=0.5 SENSITIVE Sensitive     Inducible Clindamycin NEGATIVE Sensitive     * FEW METHICILLIN RESISTANT STAPHYLOCOCCUS AUREUS  Body fluid culture     Status: None   Collection Time: 11/13/18  3:29 PM   Specimen: [facility] Cytology Peritoneal fluid  Result Value Ref Range Status   Specimen Description   Final    PERITONEAL FLUID Performed at [facility], [place] [address], [place], [place] [address]    Special Requests   Final    NONE Performed at [facility], [place], [place] [address]    Gram Stain   Final    MODERATE WBC PRESENT,BOTH PMN AND MONONUCLEAR NO ORGANISMS SEEN    Culture   Final    NO GROWTH 3 DAYS Performed at [facility], [place] [address] [place], [place] [address]    Report Status 11/17/2018 FINAL  Final         Radiology Studies: No results found.      Scheduled Meds:  Chlorhexidine Gluconate Cloth  6 each Topical Q0600   Chlorhexidine Gluconate Cloth  6 each Topical Daily   feeding supplement (ENSURE ENLIVE)  237 mL Oral BID BM   folic acid  1 mg Oral Daily   lactulose  10 g Oral BID   mouth rinse  15 mL Mouth Rinse BID   multivitamin with minerals  1 tablet Oral Daily   mupirocin ointment   Nasal BID   pantoprazole (PROTONIX) IV  40 mg Intravenous Q12H   rifaximin  550 mg Oral BID   thiamine  100 mg Oral Daily   Continuous Infusions:  sodium chloride 10 mL/hr at 11/18/18 1500   cefTRIAXone (ROCEPHIN)  IV 1 g (11/20/18 1004)   octreotide  (SANDOSTATIN)    IV infusion 50 mcg/hr (11/20/18 1009)     LOS: 7 days    Time spent: 35 minutes.     Elmarie Shiley, MD [facility] Pager [phone]  If 7PM-7AM, please contact night-coverage [email or web address] Password [email or web address] 11/20/2018, 4:18 PM

## 2018-11-21 LAB — CBC
HCT: 40.5 % (ref 36.0–46.0)
Hemoglobin: 13.3 g/dL (ref 12.0–15.0)
MCH: 31 pg (ref 26.0–34.0)
MCHC: 32.8 g/dL (ref 30.0–36.0)
MCV: 94.4 fL (ref 80.0–100.0)
Platelets: 87 10*3/uL — ABNORMAL LOW (ref 150–400)
RBC: 4.29 MIL/uL (ref 3.87–5.11)
RDW: 18.9 % — ABNORMAL HIGH (ref 11.5–15.5)
WBC: 6.1 10*3/uL (ref 4.0–10.5)
nRBC: 0 % (ref 0.0–0.2)

## 2018-11-21 LAB — COMPREHENSIVE METABOLIC PANEL
ALT: 28 U/L (ref 0–44)
AST: 55 U/L — ABNORMAL HIGH (ref 15–41)
Albumin: 2.5 g/dL — ABNORMAL LOW (ref 3.5–5.0)
Alkaline Phosphatase: 60 U/L (ref 38–126)
Anion gap: 8 (ref 5–15)
BUN: <5 mg/dL — ABNORMAL LOW (ref 6–20)
CO2: 23 mmol/L (ref 22–32)
Calcium: 9.1 mg/dL (ref 8.9–10.3)
Chloride: 103 mmol/L (ref 98–111)
Creatinine, Ser: 0.44 mg/dL (ref 0.44–1.00)
GFR calc Af Amer: >60 mL/min (ref >60)
GFR calc non Af Amer: >60 mL/min (ref >60)
Glucose, Bld: 101 mg/dL — ABNORMAL HIGH (ref 70–99)
Potassium: 4.3 mmol/L (ref 3.5–5.1)
Sodium: 134 mmol/L — ABNORMAL LOW (ref 135–145)
Total Bilirubin: 7.5 mg/dL — ABNORMAL HIGH (ref 0.3–1.2)
Total Protein: 6.9 g/dL (ref 6.5–8.1)

## 2018-11-21 MED ORDER — VITAMIN K1 10 MG/ML IJ SOLN
10.0000 mg | Freq: Once | INTRAVENOUS | Status: AC
  Administered 2018-11-21: 16:00:00 10 mg via INTRAVENOUS
  Filled 2018-11-21: qty 1

## 2018-11-21 NOTE — Progress Notes (Signed)
PROGRESS NOTE    Lisa Dennis  [id number] DOB: [birth year] DOA: 11/13/2018 PCP: Pccm, Md, MD   Brief Narrative: 53 year old female with past medical history significant for alcohol abuse and alcoholic liver cirrhosis, was admitted to [facility] on 8/19 with loss of consciousness and hemorrhagic shock, hemoglobin of 3.8, she received 8 units of packed red blood cell, 4 units of FFP and 2 units of platelets, subsequently had endoscopy which showed grade 1 varices but no evidence of fresh blood in the stomach. She underwent a paracentesis which revealed hemorrhagic peritoneal fluid.  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  These very large varices felt to be high risk for continued bleeding.   Subsequently hemoglobin has remained stable she was transferred to ICU at [facility]  for IR evaluation for TIPS, Seen  by GI as well, transfer from [facility] to [facility] on 8/23. IR and GI recommending transfer to tertiary center.  Dr. Broadus John  spoke with [facility] and [facility] 8/23, no bed at either placed on waiting list for the [facility] transfer.    Assessment & Plan:   Active Problems:   GI bleeding   Hepatic encephalopathy (HCC)   Alcoholic cirrhosis of liver with ascites (HCC)   Hemorrhage, intraperitoneal   Intra-abdominal varices   Bleeding behind the abdominal cavity   SBP (spontaneous bacterial peritonitis) ([place])   1-Hemorrhagic shock Due to intraperitoneal bleed from massive portosystemic shunt, intraperitoneal ascending colonic diuresis. 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  These very large varices felt to be high risk for continued bleed. -Hemoglobin has remained stable she continues to be on octreotide -IR and GI has been following. -TIPS felt to be very high risk in the setting of decompensated cirrhosis, bilirubin of 9. -IR and GI recommended transfer to tertiary center, Dr. Broadus John: [facility] transfer was denied.  She called  [facility] discussed case with Dr. Edison Pace Hepatology  and Dr. Quillian Quince no beds available but patient is on the waiting list for stepdown bed. -Continue with ceftriaxone. -Hemoglobin is stable, INR at 2.1 again. Discussed with GI will give vitamin K IV for 3 days.  Stable. I called [facility] 8/28//// 8/29, no bed available yet.   2-Alcoholic  cirrhosis decompensated Diuretics on hold AST is stable at 52, ALT 23, total bilirubin is stable at 7.5 today. INR still at 2.1. will give a dose of vitamin K IV.   3-hepatic encephalopathy: Alert and oriented no asterixis.  Continue with lactulose and rifaximin Resolved.  Patient is alert and oriented x3 she is able to tell me why she is in the hospital.  She does not want to give information to her family.   4-history of alcohol abuse: No evidence of withdrawal Stop drinking about a month ago. Chronic thrombocytopenia: Stable           Estimated body mass index is 21.46 kg/m as calculated from the following:   Height as of this encounter: 5\' 4"  (1.626 m).   Weight as of this encounter: 56.7 kg.   DVT prophylaxis: SCDs Code Status: Full code  Family Communication: Care discussed with patient Disposition Plan: Waiting bed at  [facility] Consultants:   IR  GI  Procedures:   paracentesis  Antimicrobials:    Subjective: She is alert and oriented times 3, denies melena.   Objective: Vitals:   11/20/18 1943 11/21/18 0614 11/21/18 0633 11/21/18 0914  BP:  (!) 165/81 (!) 144/80 124/72  Pulse: 86 63 76 76  Resp:    15  Temp:  99.8 F (37.7 C) 98.7 F (37.1 C) 98.7 F (37.1 C)   TempSrc: Oral Oral Oral   SpO2: 95% 96% 95% 96%  Weight:   56.7 kg   Height:        Intake/Output Summary (Last 24 hours) at 11/21/2018 1445 Last data filed at 11/21/2018 0930 Gross per 24 hour  Intake 360 ml  Output -  Net 360 ml   Filed Weights   11/19/18 0303 11/20/18 0626 11/21/18 0633  Weight: 60.8 kg 62 kg 56.7 kg    Examination:  General exam: NAD Respiratory system: CTA Cardiovascular system: S 1, S 2 RRR Gastrointestinal system: BS present, soft, nt Central nervous system: Non focal.  Extremities: Symmetric power Skin: No rashes  Data Reviewed: I have personally reviewed following labs and imaging studies  CBC: Recent Labs  Lab 11/17/18 1330 11/18/18 0300 11/19/18 0316 11/20/18 0836 11/21/18 0351  WBC 6.6 6.2 5.6 5.3 6.1  HGB 13.4 13.2 12.5 13.4 13.3  HCT 40.5 40.2 37.9 40.5 40.5  MCV 94.2 93.9 93.3 94.6 94.4  PLT 73* 69* 78* 77* 87*   Basic Metabolic Panel: Recent Labs  Lab 11/17/18 1330 11/18/18 0300 11/19/18 0316 11/20/18 0836 11/21/18 0351  NA 135 133* 135 134* 134*  K 3.6 3.6 3.6 3.4* 4.3  CL 103 101 103 100 103  CO2 21* 22 24 25 23   GLUCOSE 139* 100* 120* 137* 101*  BUN 6 7 <5* <5* <5*  CREATININE 0.55 0.61 0.59 0.63 0.44  CALCIUM 8.6* 8.5* 8.7* 8.9 9.1   GFR: Estimated Creatinine Clearance: 71 mL/min (by C-G formula based on SCr of 0.44 mg/dL). Liver Function Tests: Recent Labs  Lab 11/17/18 1330 11/18/18 0300 11/19/18 0316 11/20/18 0836 11/21/18 0351  AST 60* 52* 51* 61* 55*  ALT 26 23 22 30 28   ALKPHOS 65 62 61 64 60  BILITOT 7.8* 7.5* 7.7* 7.7* 7.5*  PROT 6.0* 5.9* 5.9* 6.5 6.9  ALBUMIN 2.2* 2.3* 2.3* 2.4* 2.5*   No results for input(s): LIPASE, AMYLASE in the last 168 hours. No results for input(s): AMMONIA in the last 168 hours. Coagulation Profile: Recent Labs  Lab 11/15/18 0246 11/17/18 1330 11/18/18 1224 11/19/18 0316 11/20/18 0836  INR 1.8* 2.3* 2.1*  2.1* 2.0*   Cardiac Enzymes: No results for input(s): CKTOTAL, CKMB, CKMBINDEX, TROPONINI in the last 168 hours. BNP (last 3 results) No results for input(s): PROBNP in the last 8760 hours. HbA1C: No results for input(s): HGBA1C in the last 72 hours. CBG: No results for input(s): GLUCAP in the last 168 hours. Lipid Profile: No results for input(s): CHOL, HDL, LDLCALC, TRIG, CHOLHDL, LDLDIRECT in the last 72 hours. Thyroid Function Tests: No results for input(s): TSH, T4TOTAL, FREET4, T3FREE, THYROIDAB in the last 72 hours. Anemia Panel: No results for input(s): VITAMINB12, FOLATE, FERRITIN, TIBC, IRON, RETICCTPCT in the last 72 hours. Sepsis Labs: No results for input(s): PROCALCITON, LATICACIDVEN in the last 168 hours.  Recent Results (from the past 240 hour(s))  Culture, respiratory     Status: None   Collection  Time: 11/12/18 10:38 AM   Specimen: Tracheal Aspirate; Respiratory  Result Value Ref Range Status   Specimen Description   Final    TRACHEAL ASPIRATE Performed at [facility], [address] [place], [place] [address]    Special Requests   Final    NONE Performed at [facility], [place] [place], [place] [address]    Gram Stain   Final    MODERATE WBC PRESENT,BOTH PMN AND MONONUCLEAR NO ORGANISMS SEEN Performed at [facility], [place] [address], [place], [place] [address]    Culture FEW METHICILLIN RESISTANT STAPHYLOCOCCUS AUREUS  Final   Report Status 11/14/2018 FINAL  Final   Organism ID, Bacteria METHICILLIN RESISTANT STAPHYLOCOCCUS AUREUS  Final      Susceptibility   Methicillin resistant staphylococcus aureus - MIC*    CIPROFLOXACIN >=8 RESISTANT Resistant     ERYTHROMYCIN >=8 RESISTANT Resistant     GENTAMICIN <=0.5 SENSITIVE Sensitive     OXACILLIN >=4 RESISTANT Resistant     TETRACYCLINE <=1 SENSITIVE Sensitive     VANCOMYCIN <=0.5 SENSITIVE Sensitive     TRIMETH/SULFA 160 RESISTANT Resistant     CLINDAMYCIN <=0.25  SENSITIVE Sensitive     RIFAMPIN <=0.5 SENSITIVE Sensitive     Inducible Clindamycin NEGATIVE Sensitive     * FEW METHICILLIN RESISTANT STAPHYLOCOCCUS AUREUS  Body fluid culture     Status: None   Collection Time: 11/13/18  3:29 PM   Specimen: [facility] Cytology Peritoneal fluid  Result Value Ref Range Status   Specimen Description   Final    PERITONEAL FLUID Performed at [facility], [place] [address] [place], [place] [address]    Special Requests   Final    NONE Performed at [facility], [place], [place] [address]    Gram Stain   Final    MODERATE WBC PRESENT,BOTH PMN AND MONONUCLEAR NO ORGANISMS SEEN    Culture   Final    NO GROWTH 3 DAYS Performed at [facility], [place] [address], [place], [place] [address]    Report Status 11/17/2018 FINAL  Final         Radiology Studies: No results found.      Scheduled Meds: . Chlorhexidine Gluconate Cloth  6 each Topical Q0600  . Chlorhexidine Gluconate Cloth  6 each Topical Daily  . feeding supplement (ENSURE ENLIVE)  237 mL Oral BID BM  . folic acid  1 mg Oral Daily  . lactulose  10 g Oral BID  . mouth rinse  15 mL Mouth Rinse BID  . multivitamin with minerals  1 tablet Oral Daily  . mupirocin ointment   Nasal BID  . pantoprazole (PROTONIX) IV  40 mg Intravenous Q12H  . rifaximin  550 mg Oral BID  . thiamine  100 mg Oral Daily   Continuous Infusions: . sodium chloride 10 mL/hr at 11/18/18 1500  . cefTRIAXone (ROCEPHIN)  IV 1 g (11/21/18 0927)  . octreotide  (SANDOSTATIN)    IV infusion 50 mcg/hr (11/21/18 0920)     LOS: 8 days    Time spent: 35 minutes.     Elmarie Shiley, MD [facility] Pager [phone]  If 7PM-7AM, please contact night-coverage [email or web address] Password [email or web address] 11/21/2018, 2:45 PM

## 2018-11-21 NOTE — Progress Notes (Signed)
[facility] called, no bed available at this time. RN gave updates to Rosann Auerbach, Therapist, sports at [employer].

## 2018-11-22 LAB — CBC
HCT: 38.7 % (ref 36.0–46.0)
Hemoglobin: 12.7 g/dL (ref 12.0–15.0)
MCH: 31.2 pg (ref 26.0–34.0)
MCHC: 32.8 g/dL (ref 30.0–36.0)
MCV: 95.1 fL (ref 80.0–100.0)
Platelets: 93 10*3/uL — ABNORMAL LOW (ref 150–400)
RBC: 4.07 MIL/uL (ref 3.87–5.11)
RDW: 18.9 % — ABNORMAL HIGH (ref 11.5–15.5)
WBC: 7.5 10*3/uL (ref 4.0–10.5)
nRBC: 0 % (ref 0.0–0.2)

## 2018-11-22 LAB — COMPREHENSIVE METABOLIC PANEL
ALT: 29 U/L (ref 0–44)
AST: 48 U/L — ABNORMAL HIGH (ref 15–41)
Albumin: 2.6 g/dL — ABNORMAL LOW (ref 3.5–5.0)
Alkaline Phosphatase: 73 U/L (ref 38–126)
Anion gap: 9 (ref 5–15)
BUN: <5 mg/dL — ABNORMAL LOW (ref 6–20)
CO2: 25 mmol/L (ref 22–32)
Calcium: 9.5 mg/dL (ref 8.9–10.3)
Chloride: 101 mmol/L (ref 98–111)
Creatinine, Ser: 0.56 mg/dL (ref 0.44–1.00)
GFR calc Af Amer: >60 mL/min (ref >60)
GFR calc non Af Amer: >60 mL/min (ref >60)
Glucose, Bld: 122 mg/dL — ABNORMAL HIGH (ref 70–99)
Potassium: 4.1 mmol/L (ref 3.5–5.1)
Sodium: 135 mmol/L (ref 135–145)
Total Bilirubin: 6.6 mg/dL — ABNORMAL HIGH (ref 0.3–1.2)
Total Protein: 7 g/dL (ref 6.5–8.1)

## 2018-11-22 LAB — PROTIME-INR
INR: 2 — ABNORMAL HIGH (ref 0.8–1.2)
Prothrombin Time: 22.2 seconds — ABNORMAL HIGH (ref 11.4–15.2)

## 2018-11-22 MED ORDER — OXYCODONE HCL 5 MG PO TABS
5.0000 mg | ORAL_TABLET | Freq: Once | ORAL | Status: AC
  Administered 2018-11-22: 18:00:00 5 mg via ORAL
  Filled 2018-11-22: qty 1

## 2018-11-22 MED ORDER — TRAMADOL HCL 50 MG PO TABS
50.0000 mg | ORAL_TABLET | Freq: Three times a day (TID) | ORAL | Status: DC | PRN
  Administered 2018-11-22 – 2018-11-23 (×3): 50 mg via ORAL
  Filled 2018-11-22 (×3): qty 1

## 2018-11-22 NOTE — Progress Notes (Signed)
PROGRESS NOTE    Lisa Dennis  [id number] DOB: 08-17-1965 DOA: 11/13/2018 PCP: Pccm, Md, MD   Brief Narrative: 53 year old female with past medical history significant for alcohol abuse and alcoholic liver cirrhosis, was admitted to [facility] on 8/19 with loss of consciousness and hemorrhagic shock, hemoglobin of 3.8, she received 8 units of packed red blood cell, 4 units of FFP and 2 units of platelets, subsequently had endoscopy which showed grade 1 varices but no evidence of fresh blood in the stomach. She underwent a paracentesis which revealed hemorrhagic peritoneal fluid.  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  These very large varices felt to be high risk for continued bleeding.   Subsequently hemoglobin has remained stable she was transferred to ICU at [facility]  for IR evaluation for TIPS, Seen  by GI as well, transfer from [facility] to [facility] on 8/23. IR and GI recommending transfer to tertiary center.  Dr. Broadus John  spoke with [facility] and [facility] 8/23, no bed at either placed on waiting list for the [facility] transfer.    Assessment & Plan:   Active Problems:   GI bleeding   Hepatic encephalopathy (HCC)   Alcoholic cirrhosis of liver with ascites (HCC)   Hemorrhage, intraperitoneal   Intra-abdominal varices   Bleeding behind the abdominal cavity   SBP (spontaneous bacterial peritonitis) ([place])   1-Hemorrhagic shock Due to intraperitoneal bleed from massive portosystemic shunt, intraperitoneal ascending colonic diuresis. 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  These very large varices felt to be high risk for continued bleed. -Hemoglobin has remained stable she continues to be on octreotide -IR and GI has been following. -TIPS felt to be very high risk in the setting of decompensated cirrhosis, bilirubin of 9. -IR and GI recommended transfer to tertiary center, Dr. Broadus John: [facility] transfer was denied.  She called  [facility] discussed case with Dr. Edison Pace Hepatology  and Dr. Quillian Quince no beds available but patient is on the waiting list for stepdown bed. -Continue with ceftriaxone. -Hemoglobin is stable, INR at 2.1 again. Discussed with GI will give vitamin K IV for 3 days.  Stable. I called [facility] 8/28------8/29, no bed available yet.   2-Alcoholic  cirrhosis decompensated Diuretics on hold AST is stable at 52, ALT 23, total bilirubin is stable at 7.5 today. INR still at 2.1. will give a dose of vitamin K IV.   3-Hepatic encephalopathy: Alert and oriented no asterixis.  Continue with lactulose and rifaximin Resolved.  Patient is alert and oriented x3 she is able to tell me why she is in the hospital.  She does not want to give information to her family.   4-history of alcohol abuse: No evidence of withdrawal Stop drinking about a month ago. Chronic thrombocytopenia: Stable           Estimated body mass index is 20.98 kg/m as calculated from the following:   Height as of this encounter: 5\' 4"  (1.626 m).   Weight as of this encounter: 55.4 kg.   DVT prophylaxis: SCDs Code Status: Full code Family  Communication: Care discussed with patient Disposition Plan: Waiting bed at  [facility] Consultants:   IR  GI  Procedures:   paracentesis  Antimicrobials:    Subjective: No new complaints, denies blood in the stool denies worsening abdominal pain   Objective: Vitals:   11/21/18 0914 11/21/18 1956 11/21/18 2359 11/22/18 0426  BP: 124/72 119/76 138/78 (!) 113/59  Pulse: 76 88  78 79  Resp: 15     Temp:  99.3 F (37.4 C) 99.5 F (37.5 C) 99.2 F (37.3 C)  TempSrc:  Oral Oral Oral  SpO2: 96% 93% 94% 92%  Weight:    55.4 kg  Height:        Intake/Output Summary (Last 24 hours) at 11/22/2018 1204 Last data filed at 11/22/2018 0605 Gross per 24 hour  Intake 514.99 ml  Output 150 ml  Net 364.99 ml   Filed Weights   11/20/18 0626 11/21/18 0633 11/22/18 0426  Weight: 62 kg 56.7 kg 55.4 kg    Examination:  General exam: NAD Respiratory system: CTA Cardiovascular system: S 1, S 2 RRR Gastrointestinal system: BS present, soft, nt Central nervous system: Non focal.  Extremities: Symmetric power.  Skin: No rashes.   Data Reviewed: I have personally reviewed following labs and imaging studies  CBC: Recent Labs  Lab 11/18/18 0300 11/19/18 0316 11/20/18 0836 11/21/18 0351 11/22/18 0417  WBC 6.2 5.6 5.3 6.1 7.5  HGB 13.2 12.5 13.4 13.3 12.7  HCT 40.2 37.9 40.5 40.5 38.7  MCV 93.9 93.3 94.6 94.4 95.1  PLT 69* 78* 77* 87* 93*   Basic Metabolic Panel: Recent Labs  Lab 11/18/18 0300 11/19/18 0316 11/20/18 0836 11/21/18 0351 11/22/18 0417  NA 133* 135 134* 134* 135  K 3.6 3.6 3.4* 4.3 4.1  CL 101 103 100 103 101  CO2 22 24 25 23 25   GLUCOSE 100* 120* 137* 101* 122*  BUN 7 <5* <5* <5* <5*  CREATININE 0.61 0.59 0.63 0.44 0.56  CALCIUM 8.5* 8.7* 8.9 9.1 9.5   GFR: Estimated Creatinine Clearance: 71 mL/min (by C-G formula based on SCr of 0.56 mg/dL). Liver Function Tests: Recent Labs  Lab 11/18/18 0300 11/19/18 0316 11/20/18 0836 11/21/18 0351 11/22/18 0417  AST 52* 51* 61* 55* 48*  ALT 23 22 30 28 29   ALKPHOS 62 61 64 60 73  BILITOT 7.5* 7.7* 7.7* 7.5* 6.6*  PROT 5.9* 5.9* 6.5 6.9 7.0  ALBUMIN 2.3* 2.3* 2.4* 2.5* 2.6*   No results for input(s): LIPASE, AMYLASE in the last 168 hours. No results for input(s): AMMONIA in the last 168 hours. Coagulation Profile: Recent Labs  Lab 11/17/18 1330 11/18/18 1224 11/19/18 0316 11/20/18  0836 11/22/18 0417  INR 2.3* 2.1* 2.1* 2.0* 2.0*   Cardiac Enzymes: No results for input(s): CKTOTAL, CKMB, CKMBINDEX, TROPONINI in the last 168 hours. BNP (last 3 results) No results for input(s): PROBNP in the last 8760 hours. HbA1C: No results for input(s): HGBA1C in the last 72 hours. CBG: No results for input(s): GLUCAP in the last 168 hours. Lipid Profile: No results for input(s): CHOL, HDL, LDLCALC, TRIG, CHOLHDL, LDLDIRECT in the last 72 hours. Thyroid Function Tests: No results for input(s): TSH, T4TOTAL, FREET4, T3FREE, THYROIDAB in the last 72 hours. Anemia Panel: No results for input(s): VITAMINB12, FOLATE, FERRITIN, TIBC, IRON, RETICCTPCT in the last 72 hours. Sepsis Labs: No results for input(s): PROCALCITON, LATICACIDVEN in the last 168 hours.  Recent Results (from the past 240 hour(s))  Body fluid culture  Status: None   Collection Time: 11/13/18  3:29 PM   Specimen: [facility] Cytology Peritoneal fluid  Result Value Ref Range Status   Specimen Description   Final    PERITONEAL FLUID Performed at [facility], [place] [address] [place], [place] [address]    Special Requests   Final    NONE Performed at [facility], [place], [place] [address]    Gram Stain   Final    MODERATE WBC PRESENT,BOTH PMN AND MONONUCLEAR NO ORGANISMS SEEN    Culture   Final    NO GROWTH 3 DAYS Performed at [facility], [place] [address] [place], [place] [address]    Report Status 11/17/2018 FINAL  Final         Radiology Studies: No results found.      Scheduled Meds: . Chlorhexidine Gluconate Cloth  6 each Topical Q0600  . Chlorhexidine Gluconate Cloth  6 each Topical Daily  . feeding supplement (ENSURE ENLIVE)  237 mL Oral BID BM  . folic acid  1 mg Oral Daily  . lactulose  10 g Oral BID  . mouth rinse  15 mL Mouth Rinse BID  . multivitamin with minerals  1 tablet Oral Daily  . mupirocin ointment   Nasal BID  . pantoprazole  (PROTONIX) IV  40 mg Intravenous Q12H  . rifaximin  550 mg Oral BID  . thiamine  100 mg Oral Daily   Continuous Infusions: . sodium chloride 10 mL/hr at 11/18/18 1500  . cefTRIAXone (ROCEPHIN)  IV 1 g (11/22/18 1017)  . octreotide  (SANDOSTATIN)    IV infusion 50 mcg/hr (11/22/18 0605)     LOS: 9 days    Time spent: 35 minutes.     Elmarie Shiley, MD [facility] Pager [phone]  If 7PM-7AM, please contact night-coverage [email or web address] Password [email or web address] 11/22/2018, 12:04 PM

## 2018-11-23 DIAGNOSIS — K2921 Alcoholic gastritis with bleeding: Secondary | ICD-10-CM

## 2018-11-23 LAB — CBC
HCT: 40 % (ref 36.0–46.0)
Hemoglobin: 12.7 g/dL (ref 12.0–15.0)
MCH: 31.2 pg (ref 26.0–34.0)
MCHC: 31.8 g/dL (ref 30.0–36.0)
MCV: 98.3 fL (ref 80.0–100.0)
Platelets: 83 10*3/uL — ABNORMAL LOW (ref 150–400)
RBC: 4.07 MIL/uL (ref 3.87–5.11)
RDW: 18.7 % — ABNORMAL HIGH (ref 11.5–15.5)
WBC: 6.6 10*3/uL (ref 4.0–10.5)
nRBC: 0 % (ref 0.0–0.2)

## 2018-11-23 LAB — PROTIME-INR
INR: 2 — ABNORMAL HIGH (ref 0.8–1.2)
Prothrombin Time: 22.7 seconds — ABNORMAL HIGH (ref 11.4–15.2)

## 2018-11-23 LAB — COMPREHENSIVE METABOLIC PANEL
ALT: 29 U/L (ref 0–44)
AST: 59 U/L — ABNORMAL HIGH (ref 15–41)
Albumin: 2.7 g/dL — ABNORMAL LOW (ref 3.5–5.0)
Alkaline Phosphatase: 60 U/L (ref 38–126)
Anion gap: 9 (ref 5–15)
BUN: 6 mg/dL (ref 6–20)
CO2: 25 mmol/L (ref 22–32)
Calcium: 9.2 mg/dL (ref 8.9–10.3)
Chloride: 101 mmol/L (ref 98–111)
Creatinine, Ser: 0.57 mg/dL (ref 0.44–1.00)
GFR calc Af Amer: >60 mL/min (ref >60)
GFR calc non Af Amer: >60 mL/min (ref >60)
Glucose, Bld: 91 mg/dL (ref 70–99)
Potassium: 4.5 mmol/L (ref 3.5–5.1)
Sodium: 135 mmol/L (ref 135–145)
Total Bilirubin: 7.1 mg/dL — ABNORMAL HIGH (ref 0.3–1.2)
Total Protein: 7.1 g/dL (ref 6.5–8.1)

## 2018-11-23 MED ORDER — SODIUM CHLORIDE 0.9 % IV SOLN: 50.0000 ug/h | INTRAVENOUS | 0 refills | Status: DC

## 2018-11-23 MED ORDER — OXYCODONE HCL 5 MG PO TABS
5.0000 mg | ORAL_TABLET | Freq: Three times a day (TID) | ORAL | Status: DC | PRN
  Administered 2018-11-23: 5 mg via ORAL
  Filled 2018-11-23: qty 1

## 2018-11-23 MED ORDER — RIFAXIMIN 550 MG PO TABS: 550.0000 mg | ORAL_TABLET | Freq: Two times a day (BID) | ORAL | 0 refills | Status: DC

## 2018-11-23 MED ORDER — FOLIC ACID 1 MG PO TABS: 1.0000 mg | ORAL_TABLET | Freq: Every day | ORAL | 0 refills | Status: DC

## 2018-11-23 MED ORDER — LACTULOSE 10 GM/15ML PO SOLN: 10.0000 g | Freq: Two times a day (BID) | ORAL | 0 refills | Status: DC

## 2018-11-23 MED ORDER — THIAMINE HCL 100 MG PO TABS: 100.0000 mg | ORAL_TABLET | Freq: Every day | ORAL | 0 refills | Status: DC

## 2018-11-23 NOTE — Progress Notes (Signed)
Spoke with Joy at [facility] and there is no bed available at this time.

## 2018-11-23 NOTE — Progress Notes (Signed)
Report called to oncoming facility at [facility]. Report given to Raquel Sarna, RN. Pt will be going to room 8331.

## 2018-11-23 NOTE — Progress Notes (Signed)
Patient awaiting transfer to [facility] for ongoing evaluation of her decompensated liver failure with mesenteric varices.  Hemoglobin remains stable with no further signs of bleeding at this time.  IR remains available if needed.  Please reconsult if evidence of rebleeding occurs.   Brynda Greathouse, MS RD PA-C

## 2018-11-23 NOTE — Discharge Summary (Signed)
Physician Discharge Summary  Lisa Dennis [id number] DOB: 22-May-1965 DOA: 11/13/2018  PCP: Pccm, Md, MD  Admit date: 11/13/2018 Discharge date: 11/23/2018  Admitted From: Home  Disposition:  Tranfers to [facility] hospital for further evaluation by IR for mesenteric varices.   Recommendations for Outpatient Follow-up:  1. Monitor LFT and INR.  2. Further evaluation for TIPS    Discharge Condition: Stable.  CODE STATUS; full code Diet recommendation: Heart Healthy   Brief/Interim Summary: 53 year old female with past medical history significant for alcohol abuse and alcoholic liver cirrhosis, was admitted to [facility] on 8/19 with loss of consciousness and hemorrhagic shock, hemoglobin of 3.8, she received 8 units of packed red blood cell, 4 units of FFP and 2 units of platelets, subsequently had endoscopy which showed grade 1 varices but no evidence of fresh blood in the stomach. She underwent a paracentesis which revealed hemorrhagic peritoneal fluid.  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  These very large varices felt to be high risk for continued bleeding.   Subsequently hemoglobin has remained stable she was transferred to ICU at [facility]  for IR evaluation for TIPS, Seen  by GI as well, transfer from [facility] to [facility] on 8/23. IR and GI recommending transfer to tertiary center.  Dr. Broadus John  spoke with [facility] and [facility] 8/23, no bed at either placed on waiting list for the [facility] transfer.    1-Hemorrhagic shock Due to intraperitoneal bleed from massive portosystemic shunt, intraperitoneal ascending colonic diuresis. 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  These very large varices felt to be high risk for continued bleed. -Hemoglobin has remained stable she continues to be on octreotide -IR and GI has been following. -TIPS felt to be very high risk in the setting of decompensated cirrhosis, bilirubin of 9. -IR and GI recommended transfer to tertiary center, Dr. Broadus John: [facility] transfer was denied.  She called  [facility] discussed case with Dr. Edison Pace Hepatology  and Dr. Quillian Quince no beds available but patient is on the waiting list for stepdown bed. -Continue with ceftriaxone. -Hemoglobin is stable, INR at 2.1 again. Discussed with GI will give vitamin K IV for 3 days.  Stable. I called [facility] 8/28------8/29, no bed available yet.  -[facility], working on getting patient there soon.   2-Alcoholic  cirrhosis decompensated Diuretics on hold AST is stable at 52, ALT 23, total bilirubin is stable at 7.5 today. INR still at 2.1. received Vitamin K IV for 3 days.   3-Hepatic encephalopathy: Alert and oriented no asterixis.  Continue with lactulose and rifaximin Resolved.  Patient is alert and oriented x3 she is able to tell me why she is in the hospital.  She does not want [place] to give information to her family.   4-history of alcohol abuse: No evidence of withdrawal Stop drinking about a month ago. Chronic thrombocytopenia: Stable     Discharge Diagnoses:  Active Problems:   GI bleeding   Hepatic encephalopathy (HCC)   Alcoholic cirrhosis of liver with ascites (HCC)   Hemorrhage, intraperitoneal   Intra-abdominal varices  Bleeding behind the abdominal cavity   SBP (spontaneous bacterial peritonitis) [facility])    Discharge Instructions  Discharge Instructions    Diet - low sodium heart healthy   Complete by: As directed    Increase activity slowly   Complete by: As directed       Allergies as of 11/23/2018      Reactions   Benadryl [diphenhydramine] Other (See Comments)   Reaction: makes crazy and can't sleep for days   Ciprofloxacin Itching   Latex Hives   Morphine And Related Hives   Penicillins Hives, Other (See Comments)   Did it involve swelling of the face/tongue/throat, SOB, or low BP? No Did it involve sudden or severe rash/hives, skin peeling, or any reaction on the inside of your mouth or nose? Unknown Did you need to seek medical attention at a hospital or doctor's office? Unknown When did it last happen?unknown If all above answers are "NO", may proceed with cephalosporin use.  **patient tolerates ceftriaxone - Aug 2020   Sulfa Antibiotics Hives   Tetracyclines & Related Hives      Medication List    STOP taking these medications   LORazepam 1 MG tablet Commonly known as: ATIVAN   LORazepam 2 MG/ML injection Commonly known as: ATIVAN   ondansetron 4 MG/2ML Soln injection Commonly known as: ZOFRAN   phytonadione 10 mg in dextrose 5 % 50 mL   sodium chloride 0.9 % SOLN 50 mL with thiamine 100 MG/ML SOLN 500 mg   sodium chloride flush 0.9 % Soln Commonly known as: NS   thiamine 123XX123 mg, folic acid 1 mg, multivitamins adult 10 mL in sodium chloride 0.9 % 1,000 mL     TAKE these medications   cefTRIAXone 1 g in sodium chloride 0.9 % 100 mL Inject 1 g into the vein daily.   folic acid 1 MG tablet Commonly known as: FOLVITE Take 1 tablet (1 mg total) by mouth daily. Start taking on: November 24, 2018   lactulose 10 GM/15ML solution Commonly known as: CHRONULAC Take 15 mLs (10 g total) by mouth 2 (two) times daily. What changed:   how much to take  when to take this   mupirocin ointment 2 % Commonly known as: BACTROBAN Place 1 application into the nose 2 (two) times daily.   octreotide 500 mcg in sodium chloride 0.9 % 250 mL Inject 50 mcg/hr into the vein continuous.   pantoprazole 40 MG injection Commonly known as:  PROTONIX Inject 40 mg into the vein every 12 (twelve) hours.   rifaximin 550 MG Tabs tablet Commonly known as: XIFAXAN Take 1 tablet (550 mg total) by mouth 2 (two) times daily.   thiamine 100 MG tablet Take 1 tablet (100 mg total) by mouth daily. Start taking on: November 24, 2018       Allergies  Allergen Reactions  . Benadryl [Diphenhydramine] Other (See Comments)    Reaction: makes crazy and can't sleep for days  . Ciprofloxacin Itching  . Latex Hives  . Morphine And Related Hives  . Penicillins Hives and Other (See Comments)    Did it involve swelling of the face/tongue/throat, SOB, or low BP? No Did it involve sudden or severe rash/hives, skin peeling, or any reaction on the inside of your mouth or nose? Unknown Did you need to seek medical attention at a hospital or doctor's office? Unknown When did it last happen?unknown If all above answers are "NO", may proceed with cephalosporin use.  **patient tolerates ceftriaxone -  Aug 2020   . Sulfa Antibiotics Hives  . Tetracyclines & Related Hives    Consultations:  IR  GI   Procedures/Studies: Dg Chest 1 View  Result Date: 11/11/2018 CLINICAL DATA:  Post intubation. GI bleed. EXAM: CHEST  1 VIEW COMPARISON:  Radiograph 08/28/2018 FINDINGS: Endotracheal tube tip 2.5 cm from the carina at the thoracic inlet. Tip and side port of the enteric tube below the diaphragm in the stomach. Low lung volumes. Streaky bibasilar opacities consistent with atelectasis. Normal heart size and mediastinal contours. No large pleural effusion or pneumothorax. No pulmonary edema. IMPRESSION: 1. Endotracheal tube tip 2.5 cm from the carina at the thoracic inlet. Enteric tube in place with tip and side-port below the diaphragm. 2. Low lung volumes with bibasilar atelectasis. Electronically Signed   By: Keith Rake M.D.   On: 11/11/2018 03:30   US Paracentesis  Result Date: 11/13/2018 INDICATION: 53 year old with fever. Ascites. Patient  initially presented with low hemoglobin. EXAM: ULTRASOUND GUIDED PARACENTESIS MEDICATIONS: None. COMPLICATIONS: None immediate. PROCEDURE: Informed written consent was obtained from the patient after a discussion of the risks, benefits and alternatives to treatment. A timeout was performed prior to the initiation of the procedure. Initial ultrasound scanning demonstrates a large amount of ascites within the left lower abdominal quadrant. The right lower abdomen was prepped and draped in the usual sterile fashion. 1% lidocaine was used for local anesthesia. Following this, a 6 Fr Safe-T-Centesis catheter was introduced. An ultrasound image was saved for documentation purposes. The paracentesis was performed. The catheter was removed and a dressing was applied. The patient tolerated the procedure well without immediate post procedural complication. FINDINGS: A total of approximately 2 L of bloody fluid was removed. Samples were sent to the laboratory as requested by the clinical team. Paracentesis was stopped after 2 L due to the bloody appearance of the fluid. IMPRESSION: Successful ultrasound-guided paracentesis yielding 2 liters of peritoneal fluid. These results were called by telephone at the time of interpretation on 11/13/2018 at 3:22 pm to Dr. Jonathon Bellows , who verbally acknowledged these results. Electronically Signed   By: Markus Daft M.D.   On: 11/13/2018 16:18   Dg Chest Port 1 View  Result Date: 11/12/2018 CLINICAL DATA:  Respiratory failure EXAM: PORTABLE CHEST 1 VIEW COMPARISON:  11/11/2018 FINDINGS: Endotracheal tube and NG tube are unchanged. Low lung volumes. Mild cardiomegaly. Vascular congestion. Patchy bilateral airspace opacities have increased since prior study. IMPRESSION: Cardiomegaly, vascular congestion. Patchy bilateral areas of atelectasis or infiltrate. Low lung volumes. Electronically Signed   By: Rolm Baptise M.D.   On: 11/12/2018 10:04   [place] Ascites (abdomen Limited)  Result Date:  11/11/2018 CLINICAL DATA:  Abdominal distension EXAM: LIMITED ABDOMEN ULTRASOUND FOR ASCITES TECHNIQUE: Limited ultrasound survey for ascites was performed in all four abdominal quadrants. COMPARISON:  10/21/2018 FINDINGS: Scanning in the abdomen shows mild to moderate ascites increased when compared with the prior MRI examination. Fluid is noted in all 4 quadrants. IMPRESSION: Mild to moderate ascites increased when compared with the prior MRI. Electronically Signed   By: Inez Catalina M.D.   On: 11/11/2018 12:14   Ct Angio Abd/pel W/ And/or W/o  Result Date: 11/13/2018 CLINICAL DATA:  53 year old female with alcoholic cirrhosis, esophageal varices and upper GI bleeding. Recent endoscopy did not find evidence of recent bleeding from the grade 1 varices. EXAM: CT ANGIOGRAPHY ABDOMEN AND PELVIS WITH CONTRAST AND WITHOUT CONTRAST TECHNIQUE: Multidetector CT imaging of the abdomen and pelvis was performed using the standard  protocol during bolus administration of intravenous contrast. Multiplanar reconstructed images and MIPs were obtained and reviewed to evaluate the vascular anatomy. CONTRAST:  160mL OMNIPAQUE IOHEXOL 350 MG/ML SOLN COMPARISON:  MRI abdomen 10/21/2018 FINDINGS: VASCULAR Aorta: Normal in caliber. Minimal atherosclerotic vascular calcifications. Celiac: The celiac axis gives rise to the left gastric and splenic artery. No stenosis, aneurysm or dissection. SMA: Completely replaced common hepatic artery. The origin of SMA is widely patent. Renals: Solitary renal arteries bilaterally. The renal arteries are widely patent without evidence of aneurysm, dissection or FMD. IMA: Patent without evidence of aneurysm, dissection, vasculitis or significant stenosis. Inflow: Patent without evidence of aneurysm, dissection, vasculitis or significant stenosis. Proximal Outflow: Bilateral common femoral and visualized portions of the superficial and profunda femoral arteries are patent without evidence of aneurysm,  dissection, vasculitis or significant stenosis. 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 There are small esophageal varices arising from the posterior gastric vein. However, the dominant variceal system arises from the superior mesenteric vein, specifically the right colic branch which is highly tortuous and enlarged and travels throughout the right hemiabdomen and through the wall of the ascending colon before joining the systemic system via the right ovarian vein. The right ovarian vein is diffusely enlarged and hypervascular. Where the vein passes through the wall of the ascending colon, there are massive varices. Additionally, there is a small network of varices in the duodenum at the junction of the third and fourth portions which appear to communicate with the ovarian vein as well. No gastric varices visualized. Patent and unremarkable renal veins. The IVC and iliac veins are all patent. Review of the MIP images confirms the above findings. NON-VASCULAR Lower chest: Cardiomegaly. No pericardial effusion. Trace bilateral pleural effusions with associated atelectasis. Hepatobiliary: Diffusely nodular hepatic contour consistent with cirrhosis. No arterially enhancing lesion is evident. No intra or extrahepatic biliary ductal dilatation. Multiple small stones layer dependently within the gallbladder lumen. The common bile duct is normal. Pancreas: Unremarkable. No pancreatic ductal dilatation or surrounding inflammatory changes. Spleen: Marked splenomegaly. Adrenals/Urinary Tract: Normal adrenal glands. The kidneys are unremarkable. No hydronephrosis. Unremarkable ureters. The bladder is decompressed with a Foley catheter in place. Stomach/Bowel: Please see variceal description in the venous section above. There is no evidence of intraluminal hemorrhage at this time. No evidence of bowel obstruction or focal bowel wall thickening. Lymphatic: No suspicious lymphadenopathy.  Reproductive: Uterus and bilateral adnexa are unremarkable. Other: Mild to moderate ascites. Of note, high attenuation material is layering adjacent to the varices in the right pericolic gutter and also more dependently in the pelvis consistent with hemoperitoneum. Musculoskeletal: No acute fracture or aggressive appearing lytic or blastic osseous lesion. Focal L5-S1 degenerative disc disease. IMPRESSION: VASCULAR 1. 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 Additionally, there are small jejunal varices at the junction of the third and fourth portion of the duodenum which also drain via the right gonadal vein. There is evidence of recent intraperitoneal bleeding with layering blood products in the patient's ascites. These very large varices may be at high risk for continued bleeding. 2. The portal veins remain patent. 3. Small esophageal varices consistent with grade 1 varices as described on the recent upper endoscopy. 4.  Aortic Atherosclerosis (ICD10-170.0). 5. 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 NON-VASCULAR 1. Hepatic cirrhosis with portal hypertension including splenomegaly and extensive portosystemic varices as described above. 2. Mild to moderate ascites  with layering blood products consistent with recent hemorrhage. 3. Cholelithiasis. 4. Cardiomegaly. 5. Small bilateral pleural effusions and associated atelectasis. These results were called by telephone at the time of interpretation on 11/13/2018 at 5:40 pm to Dr. Vernard Gambles , who verbally acknowledged these results. Signed, Criselda Peaches, MD, [facility]iology Electronically Signed   By: Jacqulynn Cadet M.D.   On: 11/13/2018 17:42     Subjective: Denies abdominal pain.  Complaining of shoulder pain. Denies melena  Discharge Exam: Vitals:   11/22/18 2127  11/23/18 0454  BP: 127/77 127/71  Pulse: 73 66  Resp: 16 10  Temp: 97.8 F (36.6 C) 98.5 F (36.9 C)  SpO2: 91% 94%     General: Pt is alert, awake, not in acute distress Cardiovascular: RRR, S1/S2 +, no rubs, no gallops Respiratory: CTA bilaterally, no wheezing, no rhonchi Abdominal: Soft, NT, ND, bowel sounds + Extremities: no edema, no cyanosis    The results of significant diagnostics from this hospitalization (including imaging, microbiology, ancillary and laboratory) are listed below for reference.     Microbiology: Recent Results (from the past 240 hour(s))  Body fluid culture     Status: None   Collection Time: 11/13/18  3:29 PM   Specimen: [facility] Cytology Peritoneal fluid  Result Value Ref Range Status   Specimen Description   Final    PERITONEAL FLUID Performed at [facility], [address] [place], [place] [address]    Special Requests   Final    NONE Performed at [facility], [place], [place] [address]    Gram Stain   Final    MODERATE WBC PRESENT,BOTH PMN AND MONONUCLEAR NO ORGANISMS SEEN    Culture   Final    NO GROWTH 3 DAYS Performed at [facility], [place] [address] [place], [place] [address]    Report Status 11/17/2018 FINAL  Final     Labs: BNP (last 3 results) No results for input(s): BNP in the last 8760 hours. Basic Metabolic Panel: Recent Labs  Lab 11/19/18 0316 11/20/18 0836 11/21/18 0351 11/22/18 0417 11/23/18 0818  NA 135 134* 134* 135 135  K 3.6 3.4* 4.3 4.1 4.5  CL 103 100 103 101 101  CO2 24 25 23 25 25   GLUCOSE 120* 137* 101* 122* 91  BUN <5* <5* <5* <5* 6  CREATININE 0.59 0.63 0.44 0.56 0.57  CALCIUM 8.7* 8.9 9.1 9.5 9.2   Liver Function Tests: Recent Labs  Lab 11/19/18 0316 11/20/18 0836 11/21/18 0351 11/22/18 0417 11/23/18 0818  AST 51* 61* 55* 48* 59*  ALT 22 30 28 29 29   ALKPHOS 61 64 60 73 60  BILITOT 7.7* 7.7* 7.5* 6.6* 7.1*  PROT 5.9* 6.5 6.9 7.0 7.1  ALBUMIN  2.3* 2.4* 2.5* 2.6* 2.7*   No results for input(s): LIPASE, AMYLASE in the last 168 hours. No results for input(s): AMMONIA in the last 168 hours. CBC: Recent Labs  Lab 11/19/18 0316 11/20/18 0836 11/21/18 0351 11/22/18 0417 11/23/18 0818  WBC 5.6 5.3 6.1 7.5 6.6  HGB 12.5 13.4 13.3 12.7 12.7  HCT 37.9 40.5 40.5 38.7 40.0  MCV 93.3 94.6 94.4 95.1 98.3  PLT 78* 77* 87* 93* 83*   Cardiac Enzymes: No results for input(s): CKTOTAL, CKMB, CKMBINDEX, TROPONINI in the last 168 hours. BNP: Invalid input(s): POCBNP CBG: No results for input(s): GLUCAP in the last 168 hours. D-Dimer No results for input(s): DDIMER  in the last 72 hours. Hgb A1c No results for input(s): HGBA1C in the last 72 hours. Lipid Profile No results for input(s): CHOL, HDL, LDLCALC, TRIG, CHOLHDL, LDLDIRECT in the last 72 hours. Thyroid function studies No results for input(s): TSH, T4TOTAL, T3FREE, THYROIDAB in the last 72 hours.  Invalid input(s): FREET3 Anemia work up No results for input(s): VITAMINB12, FOLATE, FERRITIN, TIBC, IRON, RETICCTPCT in the last 72 hours. Urinalysis    Component Value Date/Time   COLORURINE AMBER (A) 11/11/2018 0535   APPEARANCEUR CLOUDY (A) 11/11/2018 0535   LABSPEC 1.014 11/11/2018 0535   PHURINE 7.0 11/11/2018 0535   GLUCOSEU 150 (A) 11/11/2018 0535   HGBUR NEGATIVE 11/11/2018 0535   [place] 11/11/2018 0535   KETONESUR NEGATIVE 11/11/2018 0535   PROTEINUR 100 (A) 11/11/2018 0535   NITRITE NEGATIVE 11/11/2018 0535   LEUKOCYTESUR NEGATIVE 11/11/2018 0535   Sepsis Labs Invalid input(s): PROCALCITONIN,  WBC,  LACTICIDVEN Microbiology Recent Results (from the past 240 hour(s))  Body fluid culture     Status: None   Collection Time: 11/13/18  3:29 PM   Specimen: [facility] Cytology Peritoneal fluid  Result Value Ref Range Status   Specimen Description   Final    PERITONEAL FLUID Performed at [facility], [place] [address] [place], [place] [address]     Special Requests   Final    NONE Performed at [facility], [place], [place] [address]    Gram Stain   Final    MODERATE WBC PRESENT,BOTH PMN AND MONONUCLEAR NO ORGANISMS SEEN    Culture   Final    NO GROWTH 3 DAYS Performed at [facility], [place] [address] [place], [place] [address]    Report Status 11/17/2018 FINAL  Final     Time coordinating discharge: 40 minutes  SIGNED:   Elmarie Shiley, MD  [facility]

## 2019-01-12 ENCOUNTER — Emergency Department
Admission: EM | Admit: 2019-01-12 | Discharge: 2019-01-12 | Disposition: A | Discharge status: Home / Self Care | Payer: BLUE CROSS/BLUE SHIELD | Attending: Emergency Medicine | Admitting: Emergency Medicine

## 2019-01-12 ENCOUNTER — Encounter: Payer: Self-pay | Admitting: Emergency Medicine

## 2019-01-12 ENCOUNTER — Other Ambulatory Visit: Payer: Self-pay

## 2019-01-12 DIAGNOSIS — Z87891 Personal history of nicotine dependence: Secondary | ICD-10-CM | POA: Insufficient documentation

## 2019-01-12 DIAGNOSIS — F411 Generalized anxiety disorder: Secondary | ICD-10-CM

## 2019-01-12 DIAGNOSIS — Z79899 Other long term (current) drug therapy: Secondary | ICD-10-CM | POA: Diagnosis not present

## 2019-01-12 DIAGNOSIS — H04302 Unspecified dacryocystitis of left lacrimal passage: Secondary | ICD-10-CM | POA: Insufficient documentation

## 2019-01-12 DIAGNOSIS — F419 Anxiety disorder, unspecified: Secondary | ICD-10-CM | POA: Diagnosis present

## 2019-01-12 MED ORDER — LORAZEPAM 1 MG PO TABS
1.0000 mg | ORAL_TABLET | Freq: Once | ORAL | Status: AC
  Administered 2019-01-12: 08:00:00 1 mg via ORAL
  Filled 2019-01-12: qty 1

## 2019-01-12 MED ORDER — CLINDAMYCIN HCL 300 MG PO CAPS: 300.0000 mg | ORAL_CAPSULE | Freq: Three times a day (TID) | ORAL | 0 refills | Status: AC
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
## 2019-01-12 NOTE — ED Triage Notes (Addendum)
Pt to triage via w/c, mask in place, brought in by EMS; Pt reports she had been taking 100mg  zoloft but stopped 4wks ago; c/o anxiety since and wanting something for such; st she has been drinking to deal with such but denies wanting help for detox

## 2019-01-12 NOTE — ED Notes (Signed)
Pt states she is getting cab and will wait outside

## 2019-01-26 ENCOUNTER — Emergency Department
Admission: EM | Admit: 2019-01-26 | Discharge: 2019-01-26 | Disposition: A | Discharge status: Home / Self Care | Payer: BLUE CROSS/BLUE SHIELD | Attending: Emergency Medicine | Admitting: Emergency Medicine

## 2019-01-26 ENCOUNTER — Encounter: Payer: Self-pay | Admitting: Emergency Medicine

## 2019-01-26 ENCOUNTER — Other Ambulatory Visit: Payer: Self-pay

## 2019-01-26 DIAGNOSIS — Z87891 Personal history of nicotine dependence: Secondary | ICD-10-CM | POA: Insufficient documentation

## 2019-01-26 DIAGNOSIS — Z885 Allergy status to narcotic agent status: Secondary | ICD-10-CM | POA: Insufficient documentation

## 2019-01-26 DIAGNOSIS — Z880 Allergy status to penicillin: Secondary | ICD-10-CM | POA: Diagnosis not present

## 2019-01-26 DIAGNOSIS — Z882 Allergy status to sulfonamides status: Secondary | ICD-10-CM | POA: Diagnosis not present

## 2019-01-26 DIAGNOSIS — R945 Abnormal results of liver function studies: Secondary | ICD-10-CM | POA: Insufficient documentation

## 2019-01-26 DIAGNOSIS — Z888 Allergy status to other drugs, medicaments and biological substances status: Secondary | ICD-10-CM | POA: Insufficient documentation

## 2019-01-26 DIAGNOSIS — Z79899 Other long term (current) drug therapy: Secondary | ICD-10-CM | POA: Diagnosis not present

## 2019-01-26 DIAGNOSIS — K7030 Alcoholic cirrhosis of liver without ascites: Secondary | ICD-10-CM | POA: Insufficient documentation

## 2019-01-26 DIAGNOSIS — Z881 Allergy status to other antibiotic agents status: Secondary | ICD-10-CM | POA: Insufficient documentation

## 2019-01-26 DIAGNOSIS — D696 Thrombocytopenia, unspecified: Secondary | ICD-10-CM | POA: Diagnosis not present

## 2019-01-26 DIAGNOSIS — Z91040 Latex allergy status: Secondary | ICD-10-CM | POA: Diagnosis not present

## 2019-01-26 DIAGNOSIS — R7989 Other specified abnormal findings of blood chemistry: Secondary | ICD-10-CM

## 2019-01-26 DIAGNOSIS — R55 Syncope and collapse: Secondary | ICD-10-CM | POA: Diagnosis present

## 2019-01-26 LAB — CBC
HCT: 35.4 % — ABNORMAL LOW (ref 36.0–46.0)
Hemoglobin: 12.4 g/dL (ref 12.0–15.0)
MCH: 34.2 pg — ABNORMAL HIGH (ref 26.0–34.0)
MCHC: 35 g/dL (ref 30.0–36.0)
MCV: 97.5 fL (ref 80.0–100.0)
Platelets: 43 10*3/uL — ABNORMAL LOW (ref 150–400)
RBC: 3.63 MIL/uL — ABNORMAL LOW (ref 3.87–5.11)
RDW: 13.9 % (ref 11.5–15.5)
WBC: 3.6 10*3/uL — ABNORMAL LOW (ref 4.0–10.5)
nRBC: 0 % (ref 0.0–0.2)

## 2019-01-26 LAB — HEPATIC FUNCTION PANEL
ALT: 75 U/L — ABNORMAL HIGH (ref 0–44)
AST: 174 U/L — ABNORMAL HIGH (ref 15–41)
Albumin: 3.4 g/dL — ABNORMAL LOW (ref 3.5–5.0)
Alkaline Phosphatase: 132 U/L — ABNORMAL HIGH (ref 38–126)
Bilirubin, Direct: 2.7 mg/dL — ABNORMAL HIGH (ref 0.0–0.2)
Indirect Bilirubin: 5.4 mg/dL — ABNORMAL HIGH (ref 0.3–0.9)
Total Bilirubin: 8.1 mg/dL — ABNORMAL HIGH (ref 0.3–1.2)
Total Protein: 8.4 g/dL — ABNORMAL HIGH (ref 6.5–8.1)

## 2019-01-26 LAB — URINALYSIS, COMPLETE (UACMP) WITH MICROSCOPIC
Bacteria, UA: NONE SEEN
Bilirubin Urine: NEGATIVE
Glucose, UA: NEGATIVE mg/dL
Hgb urine dipstick: NEGATIVE
Ketones, ur: NEGATIVE mg/dL
Leukocytes,Ua: NEGATIVE
Nitrite: NEGATIVE
Protein, ur: 100 mg/dL — AB
Specific Gravity, Urine: 1.014 (ref 1.005–1.030)
pH: 8 (ref 5.0–8.0)

## 2019-01-26 LAB — BASIC METABOLIC PANEL
Anion gap: 12 (ref 5–15)
BUN: 7 mg/dL (ref 6–20)
CO2: 28 mmol/L (ref 22–32)
Calcium: 10.9 mg/dL — ABNORMAL HIGH (ref 8.9–10.3)
Chloride: 98 mmol/L (ref 98–111)
Creatinine, Ser: 0.48 mg/dL (ref 0.44–1.00)
GFR calc Af Amer: >60 mL/min (ref >60)
GFR calc non Af Amer: >60 mL/min (ref >60)
Glucose, Bld: 201 mg/dL — ABNORMAL HIGH (ref 70–99)
Potassium: 3.7 mmol/L (ref 3.5–5.1)
Sodium: 138 mmol/L (ref 135–145)

## 2019-01-26 LAB — POCT PREGNANCY, URINE: Preg Test, Ur: NEGATIVE

## 2019-01-26 LAB — AMMONIA: Ammonia: 80 umol/L — ABNORMAL HIGH (ref 9–35)

## 2019-01-26 MED ORDER — METOCLOPRAMIDE HCL 5 MG/ML IJ SOLN
10.0000 mg | Freq: Once | INTRAMUSCULAR | Status: AC
  Administered 2019-01-26: 18:00:00 10 mg via INTRAVENOUS
  Filled 2019-01-26: qty 2

## 2019-01-26 MED ORDER — ONDANSETRON HCL 4 MG/2ML IJ SOLN
4.0000 mg | Freq: Once | INTRAMUSCULAR | Status: AC
  Administered 2019-01-26: 16:00:00 4 mg via INTRAVENOUS
  Filled 2019-01-26: qty 2

## 2019-01-26 MED ORDER — ENSURE ENLIVE PO LIQD
237.0000 mL | Freq: Two times a day (BID) | ORAL | Status: DC
  Administered 2019-01-26: 17:00:00 237 mL via ORAL

## 2019-01-26 NOTE — ED Triage Notes (Signed)
Pt in via POV, reports generalized weakness, fatigue, decreased appetite x one week.  Reports near syncope episode today.  Vitals WDL, NAD noted at this time.

## 2019-01-26 NOTE — ED Notes (Signed)
Pt ambulatory with Elmyra Ricks EDT to toilet and back to urinate.

## 2019-01-26 NOTE — Discharge Instructions (Addendum)
Your liver enzymes are slightly up and your platelet count is low.  You should be careful to take your lactulose as prescribed, with a goal of having 3-4 good bowel movements daily.  Make sure to take all of your other medications as prescribed as well.  We have contacted hepatology at [facility] in order to expedite your appointment.  You should hear from Dr. Serita Grit office this week, but if you do not hear within the next several days you should call them.  Return to the ER immediately for new or worsening weakness, increased sleepiness, vomiting, inability to take your medications, abnormal bleeding, or any other new or worsening symptoms that concern you.

## 2019-01-26 NOTE — ED Provider Notes (Addendum)
[facility] Emergency Department Provider Note ____________________________________________   First MD Initiated Contact with Patient 01/26/19 1432     (approximate)  I have reviewed the triage vital signs and the nursing notes.   HISTORY  Chief Complaint Near Syncope    HPI Lisa Dennis is a 53 y.o. female with PMH as noted below including alcoholic cirrhosis and intra-abdominal hemorrhage status post TIPS procedure who presents with generalized fatigue and increased sleepiness over proximately the last 2 to 3 days.  The patient reports associated decreased appetite and states that she does not find food appealing, however she denies significant nausea or any vomiting.  The patient denies fever, shortness of breath, abdominal pain, or diarrhea.  She reports that she had 1 drink yesterday, and also had a drink a few days ago and states she "messed up."  She was also recently started on venlafaxine due to increased anxiety.   Past Medical History:  Diagnosis Date   Alcohol abuse    Anxiety    Cirrhosis ([place])     Patient Active Problem List   Diagnosis Date Noted   Bleeding behind the abdominal cavity    SBP (spontaneous bacterial peritonitis) ([place])    Intra-abdominal varices    Hemorrhage, intraperitoneal 33/00/7622   Alcoholic cirrhosis of liver with ascites ([place]) 63/33/5456   Alcoholic intoxication with complication ([place]) 25/63/8937   Hepatic encephalopathy ([place]) 08/27/2018   GI bleeding 34/28/7681   Alcoholic cirrhosis of liver without ascites ([place])    Hematemesis without nausea     Past Surgical History:  Procedure Laterality Date   ESOPHAGOGASTRODUODENOSCOPY (EGD) WITH PROPOFOL N/A 06/08/2018   Procedure: ESOPHAGOGASTRODUODENOSCOPY (EGD) WITH PROPOFOL;  Surgeon: Lucilla Lame, MD;  Location: [facility] ENDOSCOPY;  Service: Endoscopy;  Laterality: N/A;   ESOPHAGOGASTRODUODENOSCOPY (EGD) WITH PROPOFOL N/A 11/12/2018   Procedure: ESOPHAGOGASTRODUODENOSCOPY (EGD) WITH PROPOFOL;  Surgeon: Jonathon Bellows, MD;  Location: [facility] ENDOSCOPY;  Service: Gastroenterology;  Laterality: N/A;    Prior to Admission medications   Medication Sig Start Date End Date Taking? Authorizing Provider  cefTRIAXone 1 g in sodium chloride 0.9 % 100 mL Inject 1 g into the vein daily. 11/14/18   Awilda Bill, NP  folic acid (FOLVITE) 1 MG tablet Take 1 tablet (1 mg total) by mouth daily. 11/24/18   Regalado, Belkys A, MD  lactulose (CHRONULAC) 10 GM/15ML solution Take 15 mLs (10 g total) by mouth 2 (two) times daily. 11/23/18   Regalado, Belkys A, MD  mupirocin ointment (BACTROBAN) 2 % Place 1 application into the nose 2 (two) times daily. 11/13/18   Awilda Bill, NP  octreotide 500 mcg in sodium chloride 0.9 % 250 mL Inject 50 mcg/hr into the vein continuous. 11/23/18   Regalado, Belkys A, MD  pantoprazole (PROTONIX) 40 MG injection Inject 40 mg into the vein every 12 (twelve) hours. 11/13/18   Awilda Bill, NP  rifaximin (XIFAXAN) 550 MG TABS tablet Take 1 tablet (550 mg total) by mouth 2 (two) times daily. 11/23/18   Regalado, Belkys A, MD  thiamine 100 MG tablet Take 1 tablet (100 mg total) by mouth daily. 11/24/18   Regalado, Belkys A, MD    Allergies Benadryl [diphenhydramine], Penicillins, Ciprofloxacin, Latex, Morphine and related, Sulfa antibiotics, and Tetracyclines & related  No family history on file.  Social History Social History   Tobacco Use   Smoking status: Former Smoker   Smokeless tobacco: Never Used  Substance Use Topics   Alcohol use: Yes  Drug use: Never    Review of Systems  Constitutional: No fever.  Positive for fatigue. Eyes: No visual changes. ENT: No sore throat. Cardiovascular: Denies chest pain. Respiratory: Denies shortness of breath. Gastrointestinal: No vomiting or diarrhea. Genitourinary: Negative for dysuria.  Musculoskeletal: Negative for back pain. Skin: Negative for  rash. Neurological: Negative for headache.   ____________________________________________   PHYSICAL EXAM:  VITAL SIGNS: ED Triage Vitals  Enc Vitals Group     BP 01/26/19 1328 110/70     Pulse Rate 01/26/19 1328 75     Resp 01/26/19 1328 16     Temp 01/26/19 1328 98.8 F (37.1 C)     Temp Source 01/26/19 1328 Oral     SpO2 01/26/19 1500 98 %     Weight 01/26/19 1329 115 lb (52.2 kg)     Height 01/26/19 1329 5' 2" (1.575 m)     Head Circumference --      Peak Flow --      Pain Score 01/26/19 1329 0     Pain Loc --      Pain Edu? --      Excl. in [place]? --     Constitutional: Alert and oriented.  Relatively well appearing and in no acute distress. Eyes: Conjunctivae are normal.  No scleral icterus. Head: Atraumatic. Nose: No congestion/rhinnorhea. Mouth/Throat: Mucous membranes are slightly dry.   Neck: Normal range of motion.  Cardiovascular: Normal rate, regular rhythm.  Good peripheral circulation. Respiratory: Normal respiratory effort.  No retractions. Gastrointestinal: Soft and nontender. No distention.  Genitourinary: No flank tenderness. Musculoskeletal: No lower extremity edema.  Extremities warm and well perfused.  Neurologic:  Normal speech and language. No gross focal neurologic deficits are appreciated.  Skin:  Skin is warm and dry. No rash noted. Psychiatric: Mood and affect are normal. Speech and behavior are normal.  ____________________________________________   LABS (all labs ordered are listed, but only abnormal results are displayed)  Labs Reviewed  BASIC METABOLIC PANEL - Abnormal; Notable for the following components:      Result Value   Glucose, Bld 201 (*)    Calcium 10.9 (*)    All other components within normal limits  CBC - Abnormal; Notable for the following components:   WBC 3.6 (*)    RBC 3.63 (*)    HCT 35.4 (*)    MCH 34.2 (*)    Platelets 43 (*)    All other components within normal limits  URINALYSIS, COMPLETE (UACMP) WITH  MICROSCOPIC - Abnormal; Notable for the following components:   Color, Urine AMBER (*)    APPearance TURBID (*)    Protein, ur 100 (*)    All other components within normal limits  HEPATIC FUNCTION PANEL - Abnormal; Notable for the following components:   Total Protein 8.4 (*)    Albumin 3.4 (*)    AST 174 (*)    ALT 75 (*)    Alkaline Phosphatase 132 (*)    Total Bilirubin 8.1 (*)    Bilirubin, Direct 2.7 (*)    Indirect Bilirubin 5.4 (*)    All other components within normal limits  AMMONIA - Abnormal; Notable for the following components:   Ammonia 80 (*)    All other components within normal limits  POC URINE PREG, ED  POCT PREGNANCY, URINE  CBG MONITORING, ED   ____________________________________________  EKG  ED ECG REPORT I, Arta Silence, the attending physician, personally viewed and interpreted this ECG.  Date: 01/26/2019 EKG Time: 1442  Rate: 74 Rhythm: normal sinus rhythm QRS Axis: normal Intervals: normal ST/T Wave abnormalities: normal Narrative Interpretation: no evidence of acute ischemia  ____________________________________________  RADIOLOGY    ____________________________________________   PROCEDURES  Procedure(s) performed: No  Procedures  Critical Care performed: No ____________________________________________   INITIAL IMPRESSION / ASSESSMENT AND PLAN / ED COURSE  Pertinent labs & imaging results that were available during my care of the patient were reviewed by me and considered in my medical decision making (see chart for details).  53 year old female with PMH as noted above including alcoholic cirrhosis and intraperitoneal hemorrhage status post TIPS procedure who presents with increased fatigue and sleepiness over the last several days, as well as persistent anxiety.  She states that she has had an occasional drink over the last few weeks although she previously had been sober for several months.  She also reports being  recently put on venlafaxine for anxiety.  I reviewed the past medical records in [place].  Based on the patient's most recent visit with her PMD Dr. Sabra Heck, her active medical issues are as follows:  Alcohol cirrhosis- intra-abdominal hemorrhage post TIPS procedure, labs pending, try to find a good balance with her Xifaxan and lactulose such that she does not have overt diarrhea Continue on nadolol No significant ascites at this point, seems to be getting stronger and working on that Spontaneous bacterial peritonitis-Cipro through Thanksgiving Leg numbness- better post TIPS procedure, check B12 level Anxiety- a moderate issue, low-dose Effexor XR started, stop Zoloft Alcohol abuse- stopped drinking about 3 months ago   On exam, the patient is relatively well-appearing.  Her vital signs are normal.  The abdomen is soft and nontender.  She has slightly dry mucous membranes.  She is alert and oriented x3 and the neurologic exam is nonfocal.  Overall, the leading possibility is elevated ammonia/hepatic encephalopathy specialist the patient states that she has missed a few doses of her medications for this.  She could also be having side effects of the venlafaxine.  Differential also includes anemia, dehydration, other metabolic etiology.  There is no evidence of cardiac etiology at this time.  We will obtain lab work-up and reassess.  ----------------------------------------- 7:09 PM on 01/26/2019 -----------------------------------------  The lab work-up shows an ammonia of 80 which is only minimally increased from her most recent labs.  Her LFTs are also somewhat more elevated, with total bilirubin of 8.1, AST 174, ALT 75, and alk phos 132, up from total bilirubin of 4.9, AST 78, ALT 50, and alk phos 69 in September.  The patient's platelets are also lower than most recent labs.  However, they are similar to values from earlier this year and the patient has no evidence of  bleeding, a stable hemoglobin, and no indication for transfusion at this time.   Given that the patient is awake and alert, has stable vital signs, and no evidence of any acute deterioration, she does not require admission or emergent treatment.  However I wanted to ensure close follow-up and discuss any changes in management that might be needed prior to her following up.  I confirmed with the patient that she is scheduled to follow-up with Dr. Posey Pronto from hepatology at [facility] on December 11.  I contacted the [facility] transfer center and discussed the patient's case with the on-call hepatologist Dr. Loletha Grayer.  Based on the information I gave her, Dr. Loletha Grayer primarily advised that the patient stay diligent with her lactulose but did not recommend other acute changes in her  management.  She did agree with trying to get the patient to follow-up sooner.  It was too late in the day to directly reschedule the patient, but she sent a message to Dr. Posey Pronto to contact the patient.  At this time, the patient continues to be alert and oriented.  She appears comfortable and has no pain or vomiting.  She feels well to go home.  I counseled her on the results of the work-up and the plan of care.  I advised her about the plan to have [facility] hepatology contact her for sooner appointment, but instructed her to call this week if she did not hear from them.  I gave the patient very thorough return precautions and she expressed understanding. ____________________________________________   FINAL CLINICAL IMPRESSION(S) / ED DIAGNOSES  Final diagnoses:  Elevated LFTs  Thrombocytopenia ([place])      NEW MEDICATIONS STARTED DURING THIS VISIT:  Discharge Medication List as of 01/26/2019  7:18 PM       Note:  This document was prepared using Dragon voice recognition software and may include unintentional dictation errors.    Arta Silence, MD 01/26/19 Si [place]    Arta Silence, MD 01/26/19 2030

## 2019-01-26 NOTE — ED Notes (Signed)
Melanie EDT ambulated pt to toilet. Pt complained of nausea. Will inform EDP.
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
## 2019-01-30 ENCOUNTER — Emergency Department
Admission: EM | Admit: 2019-01-30 | Discharge: 2019-01-30 | Disposition: A | Discharge status: Home / Self Care | Payer: BLUE CROSS/BLUE SHIELD | Attending: Emergency Medicine | Admitting: Emergency Medicine

## 2019-01-30 ENCOUNTER — Other Ambulatory Visit: Payer: Self-pay

## 2019-01-30 DIAGNOSIS — Z91040 Latex allergy status: Secondary | ICD-10-CM | POA: Insufficient documentation

## 2019-01-30 DIAGNOSIS — K7030 Alcoholic cirrhosis of liver without ascites: Secondary | ICD-10-CM | POA: Insufficient documentation

## 2019-01-30 DIAGNOSIS — Z87891 Personal history of nicotine dependence: Secondary | ICD-10-CM | POA: Insufficient documentation

## 2019-01-30 DIAGNOSIS — R17 Unspecified jaundice: Secondary | ICD-10-CM | POA: Diagnosis present

## 2019-01-30 LAB — COMPREHENSIVE METABOLIC PANEL
ALT: 51 U/L — ABNORMAL HIGH (ref 0–44)
AST: 75 U/L — ABNORMAL HIGH (ref 15–41)
Albumin: 3.6 g/dL (ref 3.5–5.0)
Alkaline Phosphatase: 119 U/L (ref 38–126)
Anion gap: 10 (ref 5–15)
BUN: 13 mg/dL (ref 6–20)
CO2: 30 mmol/L (ref 22–32)
Calcium: 11.3 mg/dL — ABNORMAL HIGH (ref 8.9–10.3)
Chloride: 94 mmol/L — ABNORMAL LOW (ref 98–111)
Creatinine, Ser: 0.49 mg/dL (ref 0.44–1.00)
GFR calc Af Amer: >60 mL/min (ref >60)
GFR calc non Af Amer: >60 mL/min (ref >60)
Glucose, Bld: 119 mg/dL — ABNORMAL HIGH (ref 70–99)
Potassium: 3.2 mmol/L — ABNORMAL LOW (ref 3.5–5.1)
Sodium: 134 mmol/L — ABNORMAL LOW (ref 135–145)
Total Bilirubin: 10.6 mg/dL — ABNORMAL HIGH (ref 0.3–1.2)
Total Protein: 8.4 g/dL — ABNORMAL HIGH (ref 6.5–8.1)

## 2019-01-30 LAB — CBC
HCT: 34.3 % — ABNORMAL LOW (ref 36.0–46.0)
Hemoglobin: 12.2 g/dL (ref 12.0–15.0)
MCH: 34.6 pg — ABNORMAL HIGH (ref 26.0–34.0)
MCHC: 35.6 g/dL (ref 30.0–36.0)
MCV: 97.2 fL (ref 80.0–100.0)
Platelets: 63 10*3/uL — ABNORMAL LOW (ref 150–400)
RBC: 3.53 MIL/uL — ABNORMAL LOW (ref 3.87–5.11)
RDW: 13.9 % (ref 11.5–15.5)
WBC: 3.8 10*3/uL — ABNORMAL LOW (ref 4.0–10.5)
nRBC: 0 % (ref 0.0–0.2)

## 2019-01-30 LAB — AMMONIA: Ammonia: 21 umol/L (ref 9–35)

## 2019-01-30 LAB — PROTIME-INR
INR: 1.7 — ABNORMAL HIGH (ref 0.8–1.2)
Prothrombin Time: 19.7 seconds — ABNORMAL HIGH (ref 11.4–15.2)

## 2019-01-30 MED ORDER — FOLIC ACID 1 MG PO TABS: 1.0000 mg | ORAL_TABLET | Freq: Every day | ORAL | 0 refills | Status: AC

## 2019-01-30 MED ORDER — NADOLOL 20 MG PO TABS: 20.0000 mg | ORAL_TABLET | Freq: Every day | ORAL | 0 refills | Status: DC

## 2019-01-30 MED ORDER — THIAMINE HCL 100 MG PO TABS: 100.0000 mg | ORAL_TABLET | Freq: Every day | ORAL | 0 refills | Status: DC

## 2019-01-30 MED ORDER — PANTOPRAZOLE SODIUM 40 MG PO TBEC: 40.0000 mg | DELAYED_RELEASE_TABLET | Freq: Every day | ORAL | 0 refills | Status: DC

## 2019-01-30 MED ORDER — RIFAXIMIN 550 MG PO TABS: 550.0000 mg | ORAL_TABLET | Freq: Two times a day (BID) | ORAL | 0 refills | Status: AC

## 2019-01-30 MED ORDER — LACTULOSE 10 GM/15ML PO SOLN: 10.0000 g | Freq: Four times a day (QID) | ORAL | 0 refills | Status: DC | PRN

## 2019-01-30 NOTE — ED Triage Notes (Signed)
Pt to the er for increased ammonia levels and decreased platelets. Pt has jaundice of the yes and cirrhosis of the liver. Pt taking lactulose with no results.

## 2019-01-30 NOTE — ED Provider Notes (Signed)
[facility] Emergency Department Provider Note  ____________________________________________  Time seen: Approximately 8:59 AM  I have reviewed the triage vital signs and the nursing notes.   HISTORY  Chief Complaint Jaundice    HPI Lisa Dennis is a 53 y.o. female with a history of alcoholic cirrhosis who comes to the ED complaining of persistent jaundice.  Reports she was in the ED 4 days ago, noted to have high bilirubin and low platelets, told to increase her lactulose, and follow-up with her hepatologist.  She has been calling her hepatologist but has not received a call back to expedite her upcoming appointment which is currently scheduled for March 05, 2019.  She reports that she is having a bowel movement about once every few days.  Denies confusion, loss of balance, or falls.  No trauma.  No black or bloody stool.  She has questions about her medication regimen.  She denies any recent drinking.      Past Medical History:  Diagnosis Date  . Alcohol abuse   . Anxiety   . Cirrhosis [facility])      Patient Active Problem List   Diagnosis Date Noted  . Bleeding behind the abdominal cavity   . SBP (spontaneous bacterial peritonitis) ([place])   . Intra-abdominal varices   . Hemorrhage, intraperitoneal 11/15/2018  . Alcoholic cirrhosis of liver with ascites ([place]) 11/13/2018  . Alcoholic intoxication with complication ([place]) [id number]  . Hepatic encephalopathy ([place]) 08/27/2018  . GI bleeding 06/08/2018  . Alcoholic cirrhosis of liver without ascites ([place])   . Hematemesis without nausea      Past Surgical History:  Procedure Laterality Date  . ESOPHAGOGASTRODUODENOSCOPY (EGD) WITH PROPOFOL N/A 06/08/2018   Procedure: ESOPHAGOGASTRODUODENOSCOPY (EGD) WITH PROPOFOL;  Surgeon: Lucilla Lame, MD;  Location: [facility] ENDOSCOPY;  Service: Endoscopy;  Laterality: N/A;  . ESOPHAGOGASTRODUODENOSCOPY (EGD) WITH PROPOFOL N/A 11/12/2018   Procedure:  ESOPHAGOGASTRODUODENOSCOPY (EGD) WITH PROPOFOL;  Surgeon: Jonathon Bellows, MD;  Location: [facility] ENDOSCOPY;  Service: Gastroenterology;  Laterality: N/A;     Prior to Admission medications   Medication Sig Start Date End Date Taking? Authorizing Provider  cefTRIAXone 1 g in sodium chloride 0.9 % 100 mL Inject 1 g into the vein daily. 11/14/18   Awilda Bill, NP  folic acid (FOLVITE) 1 MG tablet Take 1 tablet (1 mg total) by mouth daily. 01/30/19   Carrie Mew, MD  lactulose (CHRONULAC) 10 GM/15ML solution Take 15 mLs (10 g total) by mouth 4 (four) times daily as needed for mild constipation (take as needed to achieve 3-4 bowel movements a day). 01/30/19   Carrie Mew, MD  mupirocin ointment (BACTROBAN) 2 % Place 1 application into the nose 2 (two) times daily. 11/13/18   Awilda Bill, NP  nadolol (CORGARD) 20 MG tablet Take 1 tablet (20 mg total) by mouth daily. 01/30/19 04/30/19  Carrie Mew, MD  octreotide 500 mcg in sodium chloride 0.9 % 250 mL Inject 50 mcg/hr into the vein continuous. 11/23/18   Regalado, Belkys A, MD  pantoprazole (PROTONIX) 40 MG tablet Take 1 tablet (40 mg total) by mouth daily. 01/30/19 04/30/19  Carrie Mew, MD  rifaximin (XIFAXAN) 550 MG TABS tablet Take 1 tablet (550 mg total) by mouth 2 (two) times daily. 01/30/19 04/30/19  Carrie Mew, MD  thiamine 100 MG tablet Take 1 tablet (100 mg total) by mouth daily. 01/30/19   Carrie Mew, MD     Allergies Benadryl [diphenhydramine], Penicillins, Ciprofloxacin, Latex, Morphine and related, Sulfa antibiotics,  and Tetracyclines & related   No family history on file.  Social History Social History   Tobacco Use  . Smoking status: Former Research scientist (life sciences)  . Smokeless tobacco: Never Used  Substance Use Topics  . Alcohol use: Yes  . Drug use: Never    Review of Systems  Constitutional:   No fever or chills.  ENT:   No sore throat. No rhinorrhea. Cardiovascular:   No chest pain or  syncope. Respiratory:   No dyspnea or cough. Gastrointestinal:   Negative for abdominal pain, vomiting and diarrhea.  Musculoskeletal:   Negative for focal pain or swelling All other systems reviewed and are negative except as documented above in ROS and HPI.  ____________________________________________   PHYSICAL EXAM:  VITAL SIGNS: ED Triage Vitals  Enc Vitals Group     BP 01/30/19 0654 120/75     Pulse Rate 01/30/19 0654 81     Resp 01/30/19 0654 16     Temp 01/30/19 0654 98.5 F (36.9 C)     Temp Source 01/30/19 0654 Oral     SpO2 01/30/19 0654 98 %     Weight 01/30/19 0644 112 lb (50.8 kg)     Height 01/30/19 0644 5\' 2"  (1.575 m)     Head Circumference --      Peak Flow --      Pain Score 01/30/19 0644 0     Pain Loc --      Pain Edu? --      Excl. in [place]? --     Vital signs reviewed, nursing assessments reviewed.   Constitutional:   Alert and oriented. Non-toxic appearance. Eyes:   Conjunctivae are slightly icteric. EOMI. PERRL. ENT      Head:   Normocephalic and atraumatic.      Nose:   Wearing a mask.      Mouth/Throat:   Wearing a mask.      Neck:   No meningismus. Full ROM. Hematological/Lymphatic/Immunilogical:   No cervical lymphadenopathy. Cardiovascular:   RRR. Symmetric bilateral radial and DP pulses.  No murmurs. Cap refill less than 2 seconds. Respiratory:   Normal respiratory effort without tachypnea/retractions. Breath sounds are clear and equal bilaterally. No wheezes/rales/rhonchi. Gastrointestinal:   Soft and nontender. Non distended. There is no CVA tenderness.  No rebound, rigidity, or guarding. Musculoskeletal:   Normal range of motion in all extremities. No joint effusions.  No lower extremity tenderness.  No edema. Neurologic:   Normal speech and language.  Motor grossly intact. No acute focal neurologic deficits are appreciated.  Skin:    Skin is warm, dry and intact. No rash noted.  No petechiae, purpura, or  bullae.  ____________________________________________    LABS (pertinent positives/negatives) (all labs ordered are listed, but only abnormal results are displayed) Labs Reviewed  CBC - Abnormal; Notable for the following components:      Result Value   WBC 3.8 (*)    RBC 3.53 (*)    HCT 34.3 (*)    MCH 34.6 (*)    Platelets 63 (*)    All other components within normal limits  COMPREHENSIVE METABOLIC PANEL - Abnormal; Notable for the following components:   Sodium 134 (*)    Potassium 3.2 (*)    Chloride 94 (*)    Glucose, Bld 119 (*)    Calcium 11.3 (*)    Total Protein 8.4 (*)    AST 75 (*)    ALT 51 (*)    Total Bilirubin 10.6 (*)  All other components within normal limits  PROTIME-INR - Abnormal; Notable for the following components:   Prothrombin Time 19.7 (*)    INR 1.7 (*)    All other components within normal limits  AMMONIA   ____________________________________________   EKG    ____________________________________________    RADIOLOGY  No results found.  ____________________________________________   PROCEDURES Procedures  ____________________________________________    CLINICAL IMPRESSION / ASSESSMENT AND PLAN / ED COURSE  Medications ordered in the ED: Medications - No data to display  Pertinent labs & imaging results that were available during my care of the patient were reviewed by me and considered in my medical decision making (see chart for details).  Lisa Dennis was evaluated in Emergency Department on 01/30/2019 for the symptoms described in the history of present illness. She was evaluated in the context of the global COVID-19 pandemic, which necessitated consideration that the patient might be at risk for infection with the SARS-CoV-2 virus that causes COVID-19. Institutional protocols and algorithms that pertain to the evaluation of patients at risk for COVID-19 are in a state of rapid change based on information  released by regulatory bodies including the CDC and federal and state organizations. These policies and algorithms were followed during the patient's care in the ED.   Patient presents with questions about her medication regimen and concerns about jaundice in the setting of alcoholic cirrhosis, status post TIPS 2 months ago.  Followed by [facility] hepatology.  Vital signs are normal.  She is nontoxic, exam is benign and reassuring.  Labs show baseline chronic thrombocytopenia.  Bilirubin is 10 which is only slightly increased from chronic baseline.  Ammonia is normal.  Hemoglobin is 12.  Overall reassuring lab panel.  Advised patient that she will need to titrate her lactulose with a goal of having 3-4 bowel movements a day.  She reports that she had only been taking it 1 time or 2 times a day.  She will increase this.  Also advised that she can take an occasional dose of MiraLAX if she goes a day without having a bowel movement.  Patient requests refills on all her medications which I have provided.  Encourage her to continue following up with her specialist.Considering the patient's symptoms, medical history, and physical examination today, I have low suspicion for cholecystitis or biliary pathology, pancreatitis, perforation or bowel obstruction, hernia, intra-abdominal abscess, AAA or dissection, volvulus or intussusception, mesenteric ischemia, or appendicitis.  No evidence of encephalopathy or bleeding complication.      ____________________________________________   FINAL CLINICAL IMPRESSION(S) / ED DIAGNOSES    Final diagnoses:  Alcoholic cirrhosis of liver without ascites [facility])     ED Discharge Orders         Ordered    folic acid (FOLVITE) 1 MG tablet  Daily     01/30/19 0858    lactulose (CHRONULAC) 10 GM/15ML solution  4 times daily PRN     01/30/19 0858    thiamine 100 MG tablet  Daily     01/30/19 0858    rifaximin (XIFAXAN) 550 MG TABS tablet  2 times daily     01/30/19 0858     pantoprazole (PROTONIX) 40 MG tablet  Daily     01/30/19 0858    nadolol (CORGARD) 20 MG tablet  Daily     01/30/19 0858          Portions of this note were generated with dragon dictation software. Dictation errors may occur despite best attempts  at proofreading.   Carrie Mew, MD 01/30/19 [phone]

## 2019-04-03 ENCOUNTER — Observation Stay
Admission: EM | Admit: 2019-04-03 | Discharge: 2019-04-06 | Disposition: A | Discharge status: Home / Self Care | Payer: PRIVATE HEALTH INSURANCE | Attending: Internal Medicine | Admitting: Internal Medicine

## 2019-04-03 ENCOUNTER — Other Ambulatory Visit: Payer: Self-pay

## 2019-04-03 ENCOUNTER — Encounter: Payer: Self-pay | Admitting: Emergency Medicine

## 2019-04-03 DIAGNOSIS — I8510 Secondary esophageal varices without bleeding: Secondary | ICD-10-CM | POA: Diagnosis present

## 2019-04-03 DIAGNOSIS — K7030 Alcoholic cirrhosis of liver without ascites: Secondary | ICD-10-CM | POA: Insufficient documentation

## 2019-04-03 DIAGNOSIS — F10930 Alcohol use, unspecified with withdrawal, uncomplicated: Secondary | ICD-10-CM | POA: Diagnosis present

## 2019-04-03 DIAGNOSIS — Z79899 Other long term (current) drug therapy: Secondary | ICD-10-CM | POA: Diagnosis not present

## 2019-04-03 DIAGNOSIS — Z9889 Other specified postprocedural states: Secondary | ICD-10-CM | POA: Diagnosis not present

## 2019-04-03 DIAGNOSIS — D689 Coagulation defect, unspecified: Secondary | ICD-10-CM | POA: Diagnosis present

## 2019-04-03 DIAGNOSIS — K644 Residual hemorrhoidal skin tags: Secondary | ICD-10-CM | POA: Insufficient documentation

## 2019-04-03 DIAGNOSIS — E876 Hypokalemia: Secondary | ICD-10-CM | POA: Insufficient documentation

## 2019-04-03 DIAGNOSIS — K6289 Other specified diseases of anus and rectum: Secondary | ICD-10-CM | POA: Diagnosis not present

## 2019-04-03 DIAGNOSIS — Z20822 Contact with and (suspected) exposure to covid-19: Secondary | ICD-10-CM | POA: Diagnosis not present

## 2019-04-03 DIAGNOSIS — F10230 Alcohol dependence with withdrawal, uncomplicated: Secondary | ICD-10-CM | POA: Diagnosis not present

## 2019-04-03 DIAGNOSIS — K625 Hemorrhage of anus and rectum: Principal | ICD-10-CM | POA: Insufficient documentation

## 2019-04-03 DIAGNOSIS — D72819 Decreased white blood cell count, unspecified: Secondary | ICD-10-CM | POA: Diagnosis not present

## 2019-04-03 DIAGNOSIS — K922 Gastrointestinal hemorrhage, unspecified: Secondary | ICD-10-CM | POA: Insufficient documentation

## 2019-04-03 DIAGNOSIS — N189 Chronic kidney disease, unspecified: Secondary | ICD-10-CM | POA: Diagnosis not present

## 2019-04-03 DIAGNOSIS — D696 Thrombocytopenia, unspecified: Secondary | ICD-10-CM | POA: Diagnosis not present

## 2019-04-03 DIAGNOSIS — Z95828 Presence of other vascular implants and grafts: Secondary | ICD-10-CM | POA: Diagnosis not present

## 2019-04-03 DIAGNOSIS — F411 Generalized anxiety disorder: Secondary | ICD-10-CM | POA: Insufficient documentation

## 2019-04-03 DIAGNOSIS — D6959 Other secondary thrombocytopenia: Secondary | ICD-10-CM | POA: Diagnosis present

## 2019-04-03 DIAGNOSIS — K766 Portal hypertension: Secondary | ICD-10-CM | POA: Diagnosis present

## 2019-04-03 DIAGNOSIS — K5900 Constipation, unspecified: Secondary | ICD-10-CM | POA: Diagnosis not present

## 2019-04-03 DIAGNOSIS — K626 Ulcer of anus and rectum: Secondary | ICD-10-CM | POA: Insufficient documentation

## 2019-04-03 DIAGNOSIS — Z87891 Personal history of nicotine dependence: Secondary | ICD-10-CM | POA: Insufficient documentation

## 2019-04-03 LAB — PROTIME-INR
INR: 1.5 — ABNORMAL HIGH (ref 0.8–1.2)
Prothrombin Time: 17.7 seconds — ABNORMAL HIGH (ref 11.4–15.2)

## 2019-04-03 LAB — CBC WITH DIFFERENTIAL/PLATELET
Abs Immature Granulocytes: 0.02 10*3/uL (ref 0.00–0.07)
Basophils Absolute: 0.1 10*3/uL (ref 0.0–0.1)
Basophils Relative: 3 %
Eosinophils Absolute: 0.1 10*3/uL (ref 0.0–0.5)
Eosinophils Relative: 2 %
HCT: 34.9 % — ABNORMAL LOW (ref 36.0–46.0)
Hemoglobin: 11.7 g/dL — ABNORMAL LOW (ref 12.0–15.0)
Immature Granulocytes: 1 %
Lymphocytes Relative: 28 %
Lymphs Abs: 0.9 10*3/uL (ref 0.7–4.0)
MCH: 34.7 pg — ABNORMAL HIGH (ref 26.0–34.0)
MCHC: 33.5 g/dL (ref 30.0–36.0)
MCV: 103.6 fL — ABNORMAL HIGH (ref 80.0–100.0)
Monocytes Absolute: 0.3 10*3/uL (ref 0.1–1.0)
Monocytes Relative: 10 %
Neutro Abs: 1.7 10*3/uL (ref 1.7–7.7)
Neutrophils Relative %: 56 %
Platelets: 63 10*3/uL — ABNORMAL LOW (ref 150–400)
RBC: 3.37 MIL/uL — ABNORMAL LOW (ref 3.87–5.11)
RDW: 14.1 % (ref 11.5–15.5)
WBC: 3 10*3/uL — ABNORMAL LOW (ref 4.0–10.5)
nRBC: 0 % (ref 0.0–0.2)

## 2019-04-03 LAB — PHOSPHORUS: Phosphorus: 2.7 mg/dL (ref 2.5–4.6)

## 2019-04-03 LAB — COMPREHENSIVE METABOLIC PANEL
ALT: 52 U/L — ABNORMAL HIGH (ref 0–44)
AST: 105 U/L — ABNORMAL HIGH (ref 15–41)
Albumin: 3.8 g/dL (ref 3.5–5.0)
Alkaline Phosphatase: 136 U/L — ABNORMAL HIGH (ref 38–126)
Anion gap: 10 (ref 5–15)
BUN: 12 mg/dL (ref 6–20)
CO2: 28 mmol/L (ref 22–32)
Calcium: 10.1 mg/dL (ref 8.9–10.3)
Chloride: 104 mmol/L (ref 98–111)
Creatinine, Ser: 0.48 mg/dL (ref 0.44–1.00)
GFR calc Af Amer: >60 mL/min (ref >60)
GFR calc non Af Amer: >60 mL/min (ref >60)
Glucose, Bld: 159 mg/dL — ABNORMAL HIGH (ref 70–99)
Potassium: 3.9 mmol/L (ref 3.5–5.1)
Sodium: 142 mmol/L (ref 135–145)
Total Bilirubin: 5.5 mg/dL — ABNORMAL HIGH (ref 0.3–1.2)
Total Protein: 8.2 g/dL — ABNORMAL HIGH (ref 6.5–8.1)

## 2019-04-03 LAB — MAGNESIUM: Magnesium: 1.4 mg/dL — ABNORMAL LOW (ref 1.7–2.4)

## 2019-04-03 LAB — HEMOGLOBIN AND HEMATOCRIT, BLOOD
HCT: 32.8 % — ABNORMAL LOW (ref 36.0–46.0)
Hemoglobin: 11.4 g/dL — ABNORMAL LOW (ref 12.0–15.0)

## 2019-04-03 MED ORDER — LORAZEPAM 2 MG/ML IJ SOLN
0.0000 mg | Freq: Four times a day (QID) | INTRAMUSCULAR | Status: AC
  Administered 2019-04-04: 1 mg via INTRAVENOUS
  Filled 2019-04-03 (×2): qty 1

## 2019-04-03 MED ORDER — POLYETHYLENE GLYCOL 3350 17 G PO PACK
17.0000 g | PACK | Freq: Every day | ORAL | Status: DC | PRN
  Administered 2019-04-03: 17 g via ORAL
  Filled 2019-04-03: qty 1

## 2019-04-03 MED ORDER — ONDANSETRON HCL 4 MG PO TABS: 4.0000 mg | ORAL_TABLET | Freq: Four times a day (QID) | ORAL | Status: DC | PRN

## 2019-04-03 MED ORDER — LACTULOSE 10 GM/15ML PO SOLN
10.0000 g | Freq: Four times a day (QID) | ORAL | Status: DC | PRN
  Administered 2019-04-03: 10 g via ORAL
  Filled 2019-04-03: qty 30

## 2019-04-03 MED ORDER — NADOLOL 20 MG PO TABS
20.0000 mg | ORAL_TABLET | Freq: Every day | ORAL | Status: DC
  Administered 2019-04-04 – 2019-04-05 (×2): 20 mg via ORAL
  Filled 2019-04-03 (×3): qty 1

## 2019-04-03 MED ORDER — RIFAXIMIN 550 MG PO TABS
550.0000 mg | ORAL_TABLET | Freq: Two times a day (BID) | ORAL | Status: DC
  Administered 2019-04-04 – 2019-04-05 (×2): 550 mg via ORAL
  Filled 2019-04-03 (×8): qty 1

## 2019-04-03 MED ORDER — PANTOPRAZOLE SODIUM 40 MG PO TBEC
40.0000 mg | DELAYED_RELEASE_TABLET | Freq: Every day | ORAL | Status: DC
  Administered 2019-04-04 – 2019-04-05 (×2): 40 mg via ORAL
  Filled 2019-04-03 (×2): qty 1

## 2019-04-03 MED ORDER — ACETAMINOPHEN 650 MG RE SUPP: 650.0000 mg | Freq: Three times a day (TID) | RECTAL | Status: DC | PRN

## 2019-04-03 MED ORDER — FOLIC ACID 1 MG PO TABS
1.0000 mg | ORAL_TABLET | Freq: Every day | ORAL | Status: DC
  Administered 2019-04-04 – 2019-04-05 (×2): 1 mg via ORAL
  Filled 2019-04-03 (×2): qty 1

## 2019-04-03 MED ORDER — ACETAMINOPHEN 325 MG PO TABS
650.0000 mg | ORAL_TABLET | Freq: Three times a day (TID) | ORAL | Status: DC | PRN
  Administered 2019-04-05: 650 mg via ORAL
  Filled 2019-04-03: qty 2

## 2019-04-03 MED ORDER — DEXTROSE-NACL 5-0.9 % IV SOLN: INTRAVENOUS | Status: AC

## 2019-04-03 MED ORDER — LORAZEPAM 2 MG/ML IJ SOLN
1.0000 mg | INTRAMUSCULAR | Status: DC | PRN
  Administered 2019-04-04 (×2): 1 mg via INTRAVENOUS
  Filled 2019-04-03: qty 1

## 2019-04-03 MED ORDER — LORAZEPAM 1 MG PO TABS
1.0000 mg | ORAL_TABLET | Freq: Once | ORAL | Status: AC
  Administered 2019-04-03: 1 mg via ORAL
  Filled 2019-04-03: qty 1

## 2019-04-03 MED ORDER — THIAMINE HCL 100 MG PO TABS
100.0000 mg | ORAL_TABLET | Freq: Every day | ORAL | Status: DC
  Administered 2019-04-04 – 2019-04-05 (×2): 100 mg via ORAL
  Filled 2019-04-03 (×2): qty 1

## 2019-04-03 MED ORDER — ONDANSETRON HCL 4 MG/2ML IJ SOLN
4.0000 mg | Freq: Four times a day (QID) | INTRAMUSCULAR | Status: DC | PRN
  Administered 2019-04-04 – 2019-04-05 (×3): 4 mg via INTRAVENOUS
  Filled 2019-04-03 (×3): qty 2

## 2019-04-03 MED ORDER — LORAZEPAM 2 MG/ML IJ SOLN: 0.0000 mg | Freq: Two times a day (BID) | INTRAMUSCULAR | Status: DC

## 2019-04-03 MED ORDER — LORAZEPAM 1 MG PO TABS
1.0000 mg | ORAL_TABLET | ORAL | Status: DC | PRN
  Administered 2019-04-03: 2 mg via ORAL
  Filled 2019-04-03: qty 2

## 2019-04-03 MED ORDER — ADULT MULTIVITAMIN W/MINERALS CH
1.0000 | ORAL_TABLET | Freq: Every day | ORAL | Status: DC
  Administered 2019-04-04 – 2019-04-05 (×2): 1 via ORAL
  Filled 2019-04-03 (×2): qty 1

## 2019-04-03 NOTE — ED Notes (Signed)
Unhooked pt to use the bathroom. Pt stated she still had bleeding. This RN observed bright red blood in the toilet approximately the size of four quarters.

## 2019-04-03 NOTE — ED Notes (Signed)
Pt stated she does not want her family to be contacted regarding this admission.

## 2019-04-03 NOTE — ED Notes (Signed)
Pt inform screening staff that she needed assistance going to the restroom. Pt observed pushing wheelchair through lobby to restroom. RN assisted patient into restroom where pt stated that she was pass blood again from her rectum. RN asked how much blood and pt stated that she did not know, RN asked if it was in the toilet or just on the toilet paper. Pt stated that she wasn't sure but that she was wearing a diaper because it kept coming out. Pt assisted back to wheelchair and placed back in lobby. Pt is in NAD at this time.

## 2019-04-03 NOTE — ED Notes (Signed)
First Nurse Note: Pt to ED via [facility] from home for rectal bleeding. Pt is in NAD at this time. VSS with EMS.

## 2019-04-03 NOTE — H&P (Addendum)
History and Physical:    Lisa Dennis   [id number] DOB: 25-Jul-1965 DOA: 04/03/2019  Referring MD/provider: Dr. Blake Divine PCP: Rusty Aus, MD   Patient coming from: Home  Chief Complaint: Rectal bleeding  History of Present Illness:   Lisa Dennis is an 54 y.o. female with medical history significant for alcohol abuse, liver cirrhosis, history of esophageal variceal bleeding status post banding and TIPS in August at [facility], hemorrhoids, anxiety, presented to the hospital because of bleeding per rectum.  She said the bleeding started this morning.  She describes the blood as bright red blood.  (I saw the blood in the toilet bowl in the emergency room and it was bright red).  She said she is blood multiple times today.  She also reports constipation for the past 4 to 5 days.  She did not have any stools for the last 2 days.  She thinks her abdomen is also distended and she noticed it while she was waiting in the waiting area in the emergency room.  No vomiting, hematemesis, abdominal pain, dizziness, palpitations, shortness of breath, cough, chest pain or syncope.  ED Course:  The patient was hemodynamically stable.  Hemoglobin was 11.7, INR was 1.5 and platelet was 63,000.  ROS:   ROS all other systems reviewed were negative  Past Medical History:   Past Medical History:  Diagnosis Date  . Alcohol abuse   . Anxiety   . Cirrhosis [facility])     Past Surgical History:   Past Surgical History:  Procedure Laterality Date  . ESOPHAGOGASTRODUODENOSCOPY (EGD) WITH PROPOFOL N/A 06/08/2018   Procedure: ESOPHAGOGASTRODUODENOSCOPY (EGD) WITH PROPOFOL;  Surgeon: Lucilla Lame, MD;  Location: [facility] ENDOSCOPY;  Service: Endoscopy;  Laterality: N/A;  . ESOPHAGOGASTRODUODENOSCOPY (EGD) WITH PROPOFOL N/A 11/12/2018   Procedure: ESOPHAGOGASTRODUODENOSCOPY (EGD) WITH PROPOFOL;  Surgeon: Jonathon Bellows, MD;  Location: [facility] ENDOSCOPY;  Service: Gastroenterology;   Laterality: N/A;    Social History:   Social History   Socioeconomic History  . Marital status: Divorced    Spouse name: Not on file  . Number of children: 1  . Years of education: Not on file  . Highest education level: Bachelor's degree (e.g., BA, AB, BS)  Occupational History  . Not on file  Tobacco Use  . Smoking status: Former Research scientist (life sciences)  . Smokeless tobacco: Never Used  Substance and Sexual Activity  . Alcohol use: Yes  . Drug use: Never  . Sexual activity: Not Currently  Other Topics Concern  . Not on file  Social History Narrative   Pt recently relocated from [place] 7weeks ago.   Social Determinants of Health   Financial Resource Strain: Low Risk   . Difficulty of Paying Living Expenses: Not hard at all  Food Insecurity: No Food Insecurity  . Worried About Charity fundraiser in the Last Year: Never true  . Ran Out of Food in the Last Year: Never true  Transportation Needs: No Transportation Needs  . Lack of Transportation (Medical): No  . Lack of Transportation (Non-Medical): No  Physical Activity: Inactive  . Days of Exercise per Week: 0 days  . Minutes of Exercise per Session: 0 min  Stress: Stress Concern Present  . Feeling of Stress : Very much  Social Connections: Moderately Isolated  . Frequency of Communication with Friends and Family: More than three times a week  . Frequency of Social Gatherings with Friends and Family: Once a week  . Attends Religious Services:  Never  . Active Member of Clubs or Organizations: No  . Attends Club or Organization Meetings: Never  . Marital Status: Divorced  Human resources officer Violence: Not At Risk  . Fear of Current or Ex-Partner: No  . Emotionally Abused: No  . Physically Abused: No  . Sexually Abused: No    Allergies   Benadryl [diphenhydramine], Venlafaxine hcl er, Penicillins, Clindamycin/lincomycin, Ciprofloxacin, Latex, Morphine and related, Sulfa antibiotics, and Tetracyclines & related  Family history:    History reviewed. No pertinent family history.  Current Medications:   Prior to Admission medications   Medication Sig Start Date End Date Taking? Authorizing Provider  cefTRIAXone 1 g in sodium chloride 0.9 % 100 mL Inject 1 g into the vein daily. 11/14/18   Awilda Bill, NP  folic acid (FOLVITE) 1 MG tablet Take 1 tablet (1 mg total) by mouth daily. 01/30/19   Carrie Mew, MD  lactulose (CHRONULAC) 10 GM/15ML solution Take 15 mLs (10 g total) by mouth 4 (four) times daily as needed for mild constipation (take as needed to achieve 3-4 bowel movements a day). 01/30/19   Carrie Mew, MD  mupirocin ointment (BACTROBAN) 2 % Place 1 application into the nose 2 (two) times daily. 11/13/18   Awilda Bill, NP  nadolol (CORGARD) 20 MG tablet Take 1 tablet (20 mg total) by mouth daily. 01/30/19 04/30/19  Carrie Mew, MD  octreotide 500 mcg in sodium chloride 0.9 % 250 mL Inject 50 mcg/hr into the vein continuous. 11/23/18   Regalado, Belkys A, MD  pantoprazole (PROTONIX) 40 MG tablet Take 1 tablet (40 mg total) by mouth daily. 01/30/19 04/30/19  Carrie Mew, MD  rifaximin (XIFAXAN) 550 MG TABS tablet Take 1 tablet (550 mg total) by mouth 2 (two) times daily. 01/30/19 04/30/19  Carrie Mew, MD  thiamine 100 MG tablet Take 1 tablet (100 mg total) by mouth daily. 01/30/19   Carrie Mew, MD    Physical Exam:   Vitals:   04/03/19 1700 04/03/19 1820 04/03/19 1852 04/03/19 1949  BP: 123/82 135/80 125/76 131/77  Pulse: 84 84 85 90  Resp: 14 16 16 16   Temp:   100.3 F (37.9 C) 98.8 F (37.1 C)  TempSrc:   Oral Oral  SpO2: 95% 94% 97% 96%  Weight:      Height:         Physical Exam: Blood pressure 131/77, pulse 90, temperature 98.8 F (37.1 C), temperature source Oral, resp. rate 16, height 5\' 1"  (1.549 m), weight 52.2 kg, SpO2 96 %. Gen: No acute distress. Head: Normocephalic, atraumatic. Eyes: Pupils equal, round and reactive to light. Extraocular movements  intact.  Sclerae icteric.  Mouth: Dry mucous membrane Neck: Supple, no thyromegaly, no lymphadenopathy, no jugular venous distention. Chest: Lungs are clear to auscultation with good air movement. No rales, rhonchi or wheezes.  CV: Heart sounds are regular with an S1, S2. No murmurs, rubs or gallops.  Abdomen: Soft, nontender, nondistended with normal active bowel sounds. No palpable masses. Extremities: Extremities are without clubbing, or cyanosis. No edema. Pedal pulses 2+.  Skin: Warm and dry. No rashes, lesions or wounds Neuro: Alert and oriented times 3; grossly nonfocal.  Tremors of bilateral hands Psych: Insight is good and judgment is appropriate. Mood and affect normal.   Data Review:    Labs: Basic Metabolic Panel: Recent Labs  Lab 04/03/19 1227  NA 142  K 3.9  CL 104  CO2 28  GLUCOSE 159*  BUN 12  CREATININE 0.48  CALCIUM 10.1   Liver Function Tests: Recent Labs  Lab 04/03/19 1227  AST 105*  ALT 52*  ALKPHOS 136*  BILITOT 5.5*  PROT 8.2*  ALBUMIN 3.8   No results for input(s): LIPASE, AMYLASE in the last 168 hours. No results for input(s): AMMONIA in the last 168 hours. CBC: Recent Labs  Lab 04/03/19 1227 04/03/19 1903  WBC 3.0*  --   NEUTROABS 1.7  --   HGB 11.7* 11.4*  HCT 34.9* 32.8*  MCV 103.6*  --   PLT 63*  --    Cardiac Enzymes: No results for input(s): CKTOTAL, CKMB, CKMBINDEX, TROPONINI in the last 168 hours.  BNP (last 3 results) No results for input(s): PROBNP in the last 8760 hours. CBG: No results for input(s): GLUCAP in the last 168 hours.  Urinalysis    Component Value Date/Time   COLORURINE AMBER (A) 01/26/2019 1547   APPEARANCEUR TURBID (A) 01/26/2019 1547   LABSPEC 1.014 01/26/2019 1547   PHURINE 8.0 01/26/2019 1547   GLUCOSEU NEGATIVE 01/26/2019 1547   HGBUR NEGATIVE 01/26/2019 1547   BILIRUBINUR NEGATIVE 01/26/2019 1547   KETONESUR NEGATIVE 01/26/2019 1547   PROTEINUR 100 (A) 01/26/2019 1547   NITRITE NEGATIVE  01/26/2019 1547   LEUKOCYTESUR NEGATIVE 01/26/2019 1547      Radiographic Studies: No results found.  EKG: Independently reviewed.    Assessment/Plan:   Active Problems:   Alcoholic cirrhosis of liver without ascites (HCC)   Acute GI bleeding   Alcohol withdrawal syndrome without complication (HCC)   Body mass index is 21.73 kg/m.    Acute GI bleeding/rectal bleeding: Admit to MedSurg.  Keep n.p.o. for now.  IV fluids for hydration.  She has a history of hemorrhoids and is not clear whether she is bleeding from her hemorrhoids.  (She did not allow me to perform a rectal exam because she said it had already been performed by the ED physician).  Consulted gastroenterologist, Dr Marius Ditch.  She recommended liver ultrasound with Doppler to evaluate for TIPS.  Octreotide infusion will be considered if patient has significant bleeding.  Alcohol abuse with withdrawal syndrome:  Ativan as needed.  Placed on CIWA protocol.  Alcoholic liver cirrhosis with elevated liver enzymes, coagulopathy and thrombocytopenia/history of esophageal varices status post banding/history of TIPS: Continue lactulose and nadolol  Mild anemia probably from acute blood loss: Monitor H&H.  Leukopenia and thrombocytopenia: Probably from liver disease.  Monitor CBC  Anxiety: Ativan as needed    Other information:   DVT prophylaxis: SCDs Code Status: Full code. Family Communication: Plan discussed with patient Disposition Plan: Possible discharge to home in 2 to 3 days Consults called: Gastroenterologist Admission status: Inpatient    The medical decision making is of moderate complexity, therefore this is a level 2 visit.  Time spent 50 minutes  [place] [facility]   How to contact the [facility] Attending or Consulting provider [place] or covering provider during after hours [place], for this patient?   1. Check the care team in [facility] and look for a) attending/consulting TR[facility] provider listed  and b) the [facility] team listed 2. Log into [email or web address] and use [place]'s universal password to access. If you do not have the password, please contact the hospital operator. 3. Locate the [facility] provider you are looking for under [facility] and page to a number that you can be directly reached. 4. If you still have difficulty reaching the provider, please page the [facility] (Director on Call) for the Hospitalists  listed on amion for assistance.  04/03/2019, 8:34 PM

## 2019-04-03 NOTE — ED Triage Notes (Addendum)
Rectal bleeding began this am. States had also began drinking yesterday after one month abstinence. Patient denies use of blood thinners. States only one episode. Rainbow drawn at triage.

## 2019-04-03 NOTE — ED Provider Notes (Signed)
[facility] Emergency Department Provider Note   ____________________________________________   First MD Initiated Contact with Patient 04/03/19 1535     (approximate)  I have reviewed the triage vital signs and the nursing notes.   HISTORY  Chief Complaint Rectal Bleeding    HPI Lisa Dennis is a 54 y.o. female with past medical history of alcohol abuse, cirrhosis, and intra-abdominal hemorrhage status post TIPS procedure presents to the ED complaining of rectal bleeding.  Patient reports that earlier today she started noticing leakage of bright red blood from her rectum as well as streaks of blood mixed in with her stool.  She has been wearing a diaper and has noticed the accumulation of a few tablespoons of blood in it.  She also feels like her abdomen has been slightly more swollen than usual starting today, is concerned she might have recurrence of intra-abdominal bleeding.  She admits she started drinking again, had about 5 alcoholic drinks yesterday and then another drink this morning.  She denies any current withdrawal symptoms, but is concerned she might develop them.  She has not had any fevers, abdominal pain, nausea, or vomiting.        Past Medical History:  Diagnosis Date  . Alcohol abuse   . Anxiety   . Cirrhosis [facility])     Patient Active Problem List   Diagnosis Date Noted  . Acute GI bleeding 04/03/2019  . Alcohol withdrawal syndrome without complication ([place]) [id number]  . Bleeding behind the abdominal cavity   . SBP (spontaneous bacterial peritonitis) ([place])   . Intra-abdominal varices   . Hemorrhage, intraperitoneal 11/15/2018  . Alcoholic cirrhosis of liver with ascites ([place]) 11/13/2018  . Alcoholic intoxication with complication ([place]) [id number]  . Hepatic encephalopathy ([place]) 08/27/2018  . GI bleeding 06/08/2018  . Alcoholic cirrhosis of liver without ascites ([place])   . Hematemesis without nausea     Past Surgical  History:  Procedure Laterality Date  . ESOPHAGOGASTRODUODENOSCOPY (EGD) WITH PROPOFOL N/A 06/08/2018   Procedure: ESOPHAGOGASTRODUODENOSCOPY (EGD) WITH PROPOFOL;  Surgeon: Lucilla Lame, MD;  Location: [facility] ENDOSCOPY;  Service: Endoscopy;  Laterality: N/A;  . ESOPHAGOGASTRODUODENOSCOPY (EGD) WITH PROPOFOL N/A 11/12/2018   Procedure: ESOPHAGOGASTRODUODENOSCOPY (EGD) WITH PROPOFOL;  Surgeon: Jonathon Bellows, MD;  Location: [facility] ENDOSCOPY;  Service: Gastroenterology;  Laterality: N/A;    Prior to Admission medications   Medication Sig Start Date End Date Taking? Authorizing Provider  ciprofloxacin (CIPRO) 500 MG tablet Take 500 mg by mouth daily. 11/27/18  Yes [provider]  folic acid (FOLVITE) 1 MG tablet Take 1 tablet (1 mg total) by mouth daily. 01/30/19  Yes Carrie Mew, MD  nadolol (CORGARD) 20 MG tablet Take 1 tablet (20 mg total) by mouth daily. 01/30/19 04/30/19 Yes Carrie Mew, MD  pantoprazole (PROTONIX) 40 MG tablet Take 1 tablet (40 mg total) by mouth daily. 01/30/19 04/30/19 Yes Carrie Mew, MD  polyethylene glycol powder [facility]) 17 GM/SCOOP powder Take 17 g by mouth daily.   Yes [provider]  rifaximin (XIFAXAN) 550 MG TABS tablet Take 1 tablet (550 mg total) by mouth 2 (two) times daily. 01/30/19 04/30/19 Yes Carrie Mew, MD  thiamine 100 MG tablet Take 1 tablet (100 mg total) by mouth daily. 01/30/19  Yes Carrie Mew, MD  venlafaxine XR (EFFEXOR-XR) 37.5 MG 24 hr capsule Take 37.5 mg by mouth daily. 12/11/18  Yes [provider]  cefTRIAXone 1 g in sodium chloride 0.9 % 100 mL Inject 1 g into the vein  daily. Patient not taking: Reported on 04/03/2019 11/14/18   Awilda Bill, NP  lactulose (CHRONULAC) 10 GM/15ML solution Take 15 mLs (10 g total) by mouth 4 (four) times daily as needed for mild constipation (take as needed to achieve 3-4 bowel movements a day). Patient not taking: Reported on 04/03/2019 01/30/19   Carrie Mew,  MD  mupirocin ointment (BACTROBAN) 2 % Place 1 application into the nose 2 (two) times daily. Patient not taking: Reported on 04/03/2019 11/13/18   Awilda Bill, NP  octreotide 500 mcg in sodium chloride 0.9 % 250 mL Inject 50 mcg/hr into the vein continuous. Patient not taking: Reported on 04/03/2019 11/23/18   Niel Hummer A, MD    Allergies Benadryl [diphenhydramine], Venlafaxine hcl er, Penicillins, Clindamycin/lincomycin, Ciprofloxacin, Latex, Morphine and related, Sulfa antibiotics, and Tetracyclines & related  History reviewed. No pertinent family history.  Social History Social History   Tobacco Use  . Smoking status: Former Research scientist (life sciences)  . Smokeless tobacco: Never Used  Substance Use Topics  . Alcohol use: Yes    Alcohol/week: 10.0 standard drinks    Types: 10 Glasses of wine per week    Comment: 5 glasses yesterday  . Drug use: Never    Review of Systems  Constitutional: No fever/chills Eyes: No visual changes. ENT: No sore throat. Cardiovascular: Denies chest pain. Respiratory: Denies shortness of breath. Gastrointestinal: No abdominal pain.  No nausea, no vomiting.  No diarrhea.  No constipation.  Positive for bloody stools. Genitourinary: Negative for dysuria. Musculoskeletal: Negative for back pain. Skin: Negative for rash. Neurological: Negative for headaches, focal weakness or numbness.  ____________________________________________   PHYSICAL EXAM:  VITAL SIGNS: ED Triage Vitals  Enc Vitals Group     BP 04/03/19 1223 (!) 141/92     Pulse Rate 04/03/19 1223 83     Resp 04/03/19 1223 20     Temp 04/03/19 1223 98.2 F (36.8 C)     Temp Source 04/03/19 1223 Oral     SpO2 04/03/19 1223 97 %     Weight 04/03/19 1224 115 lb (52.2 kg)     Height 04/03/19 1224 5\' 1"  (1.549 m)     Head Circumference --      Peak Flow --      Pain Score 04/03/19 1224 4     Pain Loc --      Pain Edu? --      Excl. in [place]? --     Constitutional: Alert and oriented. Eyes:  Conjunctivae are normal. Head: Atraumatic. Nose: No congestion/rhinnorhea. Mouth/Throat: Mucous membranes are moist. Neck: Normal ROM Cardiovascular: Normal rate, regular rhythm. Grossly normal heart sounds. Respiratory: Normal respiratory effort.  No retractions. Lungs CTAB. Gastrointestinal: Soft and nontender.  Mildly distended abdomen.  External hemorrhoids with no active bleeding noted, dark brown stool is guaiac positive. Genitourinary: deferred Musculoskeletal: No lower extremity tenderness nor edema. Neurologic:  Normal speech and language. No gross focal neurologic deficits are appreciated. Skin:  Skin is warm, dry and intact. No rash noted.  Jaundice noted. Psychiatric: Mood and affect are normal. Speech and behavior are normal.  ____________________________________________   LABS (all labs ordered are listed, but only abnormal results are displayed)  Labs Reviewed  CBC WITH DIFFERENTIAL/PLATELET - Abnormal; Notable for the following components:      Result Value   WBC 3.0 (*)    RBC 3.37 (*)    Hemoglobin 11.7 (*)    HCT 34.9 (*)    MCV 103.6 (*)  MCH 34.7 (*)    Platelets 63 (*)    All other components within normal limits  COMPREHENSIVE METABOLIC PANEL - Abnormal; Notable for the following components:   Glucose, Bld 159 (*)    Total Protein 8.2 (*)    AST 105 (*)    ALT 52 (*)    Alkaline Phosphatase 136 (*)    Total Bilirubin 5.5 (*)    All other components within normal limits  PROTIME-INR - Abnormal; Notable for the following components:   Prothrombin Time 17.7 (*)    INR 1.5 (*)    All other components within normal limits  HEMOGLOBIN AND HEMATOCRIT, BLOOD - Abnormal; Notable for the following components:   Hemoglobin 11.4 (*)    HCT 32.8 (*)    All other components within normal limits  MAGNESIUM - Abnormal; Notable for the following components:   Magnesium 1.4 (*)    All other components within normal limits  SARS CORONAVIRUS 2 (TAT 6-24 HRS)   PHOSPHORUS  BASIC METABOLIC PANEL  CBC     PROCEDURES  Procedure(s) performed (including Critical Care):  Procedures   ____________________________________________   INITIAL IMPRESSION / ASSESSMENT AND PLAN / ED COURSE       54 year old female with history of cirrhosis and intra-abdominal hemorrhage status post TIPS presents to the ED with rectal bleeding and blood mixed in with her bowel movements starting earlier today.  She has external hemorrhoids noted on exam but these are not actively bleeding and she does have guaiac positive stool noted.  Her H&H is stable compared to prior, however she has chronic thrombocytopenia and elevation in her bilirubin.  Given her coagulopathy and history of bleeding, I feel she would be best observed overnight in the hospital to ensure no worsening bleeding.  While she complains of a distended abdomen, she has no tenderness at this time and no large fluid pocket noted on bedside ultrasound.  I doubt significant intra-abdominal hemorrhage similar to her previous.  Patient passed additional blood in the toilet here in the ED, but she remains hemodynamically stable at this time.  Case discussed with hospitalist, who accepts patient for admission.      ____________________________________________   FINAL CLINICAL IMPRESSION(S) / ED DIAGNOSES  Final diagnoses:  Gastrointestinal hemorrhage, unspecified gastrointestinal hemorrhage type     ED Discharge Orders    None       Note:  This document was prepared using Dragon voice recognition software and may include unintentional dictation errors.   Blake Divine, MD 04/03/19 [phone]

## 2019-04-04 ENCOUNTER — Inpatient Hospital Stay: Payer: PRIVATE HEALTH INSURANCE

## 2019-04-04 DIAGNOSIS — Z95828 Presence of other vascular implants and grafts: Secondary | ICD-10-CM

## 2019-04-04 DIAGNOSIS — E876 Hypokalemia: Secondary | ICD-10-CM

## 2019-04-04 DIAGNOSIS — K922 Gastrointestinal hemorrhage, unspecified: Secondary | ICD-10-CM | POA: Diagnosis not present

## 2019-04-04 DIAGNOSIS — K7030 Alcoholic cirrhosis of liver without ascites: Secondary | ICD-10-CM

## 2019-04-04 DIAGNOSIS — K625 Hemorrhage of anus and rectum: Secondary | ICD-10-CM | POA: Diagnosis not present

## 2019-04-04 DIAGNOSIS — F10230 Alcohol dependence with withdrawal, uncomplicated: Secondary | ICD-10-CM | POA: Diagnosis not present

## 2019-04-04 LAB — FOLATE: Folate: 15 ng/mL (ref >5.9)

## 2019-04-04 LAB — CBC
HCT: 30.6 % — ABNORMAL LOW (ref 36.0–46.0)
Hemoglobin: 10.5 g/dL — ABNORMAL LOW (ref 12.0–15.0)
MCH: 35.1 pg — ABNORMAL HIGH (ref 26.0–34.0)
MCHC: 34 g/dL (ref 30.0–36.0)
MCV: 103.4 fL — ABNORMAL HIGH (ref 80.0–100.0)
Platelets: 54 10*3/uL — ABNORMAL LOW (ref 150–400)
RBC: 2.96 MIL/uL — ABNORMAL LOW (ref 3.87–5.11)
RDW: 14.1 % (ref 11.5–15.5)
WBC: 5.4 10*3/uL (ref 4.0–10.5)
nRBC: 0 % (ref 0.0–0.2)

## 2019-04-04 LAB — IRON AND TIBC
Iron: 207 ug/dL — ABNORMAL HIGH (ref 28–170)
Saturation Ratios: 91 % — ABNORMAL HIGH (ref 10.4–31.8)
TIBC: 227 ug/dL — ABNORMAL LOW (ref 250–450)
UIBC: 20 ug/dL

## 2019-04-04 LAB — BASIC METABOLIC PANEL
Anion gap: 14 (ref 5–15)
BUN: 10 mg/dL (ref 6–20)
CO2: 21 mmol/L — ABNORMAL LOW (ref 22–32)
Calcium: 9.3 mg/dL (ref 8.9–10.3)
Chloride: 103 mmol/L (ref 98–111)
Creatinine, Ser: 0.31 mg/dL — ABNORMAL LOW (ref 0.44–1.00)
GFR calc Af Amer: >60 mL/min (ref >60)
GFR calc non Af Amer: >60 mL/min (ref >60)
Glucose, Bld: 133 mg/dL — ABNORMAL HIGH (ref 70–99)
Potassium: 3.4 mmol/L — ABNORMAL LOW (ref 3.5–5.1)
Sodium: 138 mmol/L (ref 135–145)

## 2019-04-04 LAB — SARS CORONAVIRUS 2 (TAT 6-24 HRS): SARS Coronavirus 2: NEGATIVE

## 2019-04-04 LAB — FERRITIN: Ferritin: 301 ng/mL (ref 11–307)

## 2019-04-04 LAB — VITAMIN B12: Vitamin B-12: 479 pg/mL (ref 180–914)

## 2019-04-04 MED ORDER — POTASSIUM CHLORIDE 10 MEQ/100ML IV SOLN
10.0000 meq | INTRAVENOUS | Status: AC
  Administered 2019-04-04 (×3): 10 meq via INTRAVENOUS
  Filled 2019-04-04 (×2): qty 100

## 2019-04-04 MED ORDER — HYOSCYAMINE SULFATE 0.125 MG SL SUBL
0.2500 mg | SUBLINGUAL_TABLET | Freq: Once | SUBLINGUAL | Status: AC
  Administered 2019-04-04: 0.25 mg via SUBLINGUAL
  Filled 2019-04-04: qty 2

## 2019-04-04 MED ORDER — SODIUM CHLORIDE 0.9 % IV SOLN
INTRAVENOUS | Status: DC | PRN
  Administered 2019-04-04: 1000 mL via INTRAVENOUS

## 2019-04-04 MED ORDER — MAGNESIUM SULFATE 4 GM/100ML IV SOLN
4.0000 g | Freq: Once | INTRAVENOUS | Status: AC
  Administered 2019-04-04: 4 g via INTRAVENOUS
  Filled 2019-04-04: qty 100

## 2019-04-04 MED ORDER — DEXTROSE-NACL 5-0.9 % IV SOLN: INTRAVENOUS | Status: AC

## 2019-04-04 MED ORDER — SODIUM CHLORIDE 0.9 % IV SOLN
25.0000 ug/h | INTRAVENOUS | Status: DC
  Administered 2019-04-04: 25 ug/h via INTRAVENOUS
  Filled 2019-04-04 (×3): qty 1

## 2019-04-04 MED ORDER — SODIUM CHLORIDE 0.9 % IV SOLN
INTRAVENOUS | Status: DC
  Administered 2019-04-06: 1000 mL via INTRAVENOUS

## 2019-04-04 MED ORDER — TRAMADOL HCL 50 MG PO TABS
50.0000 mg | ORAL_TABLET | Freq: Once | ORAL | Status: AC
  Administered 2019-04-04: 50 mg via ORAL
  Filled 2019-04-04: qty 1

## 2019-04-04 MED ORDER — SODIUM CHLORIDE 0.9 % IV SOLN
2.0000 g | INTRAVENOUS | Status: DC
  Administered 2019-04-04 – 2019-04-06 (×3): 2 g via INTRAVENOUS
  Filled 2019-04-04: qty 20
  Filled 2019-04-04 (×2): qty 2

## 2019-04-04 MED ORDER — PEG 3350-KCL-NA BICARB-NACL 420 G PO SOLR
4000.0000 mL | Freq: Once | ORAL | Status: AC
  Administered 2019-04-04: 4000 mL via ORAL
  Filled 2019-04-04: qty 4000

## 2019-04-04 NOTE — Progress Notes (Signed)
PROGRESS NOTE                                                                                                                                                                                                             Patient Demographics:    Lisa Dennis, is a 54 y.o. female, DOB - 1966-02-24, [place]:[id number]  Admit date - 04/03/2019   Admitting Physician Jennye Boroughs, MD  Outpatient Primary MD for the patient is Rusty Aus, MD  LOS - 1   Chief Complaint  Patient presents with  . Rectal Bleeding       Brief Narrative 54 year old female with ongoing alcohol use with liver cirrhosis, history of esophageal varices bleeding s/p banding and TI PS in August 2020 at [facility], hemorrhoids, anxiety presented to the ED with several episodes of bright red bleeding per rectum.  In the ED vitals were stable.  Hemoglobin dropped less than 1 g from her baseline, INR of 1.5 and platelets of 60 3K.  Admitted for further management.   Subjective:   Reported small amount of bright red blood per rectum this morning.  Denies any abdominal pain.   Assessment  & Plan :   Principal problem Acute GI bleed Mild symptoms, no further drop in H&H.  GI consult appreciated suspect this could be lower GI bleed (hemorrhoidal).  Ultrasound with Doppler shows patent TI PS.  Octreotide drip discontinued.  Plan on colonoscopy tomorrow.  Empiric Rocephin for SBP prophylaxis. Monitor serial H&H.   Active Problems:   Alcoholic cirrhosis of liver without ascites (HCC) Ongoing heavy alcohol use (reports drinking 4-5 glass of Chardonnay daily).  High risk of going into DTs.  Has mild withdrawal symptoms.  Monitor on CIWA.  Continue thiamine folate and multivitamin. Check iron, folate and B12 levels. Counseled strongly on alcohol cessation.  Continue lactulose and rifaximin.  Leukopenia and thrombocytopenia Secondary to decompensated liver disease.   Monitor  Hypokalemia/hypomagnesemia Replenished     Code Status : Full code  Family Communication  : None  Disposition Plan  : Home possibly in the next 24- 48 hours if no further bleeding and following colonoscopy results.  Barriers For Discharge : Active symptoms  Consults  : GI  Procedures  : Ultrasound Doppler of the abdomen  DVT Prophylaxis  : SCDs  Lab Results  Component Value  Date   PLT 54 (L) 04/04/2019    Antibiotics  :    Anti-infectives (From admission, onward)   Start     Dose/Rate Route Frequency Ordered Stop   04/04/19 0800  cefTRIAXone (ROCEPHIN) 2 g in sodium chloride 0.9 % 100 mL IVPB     2 g 200 mL/hr over 30 Minutes Intravenous Every 24 hours 04/04/19 0725     04/03/19 2200  rifaximin (XIFAXAN) tablet 550 mg     550 mg Oral 2 times daily 04/03/19 1735          Objective:   Vitals:   04/03/19 1852 04/03/19 1949 04/04/19 0351 04/04/19 1454  BP: 125/76 131/77 132/77 129/77  Pulse: 85 90 89 65  Resp: 16 16 16 18   Temp: 100.3 F (37.9 C) 98.8 F (37.1 C) 99.2 F (37.3 C) 98.8 F (37.1 C)  TempSrc: Oral Oral Oral Oral  SpO2: 97% 96% 99% 99%  Weight:      Height:        Wt Readings from Last 3 Encounters:  04/03/19 52.2 kg  01/30/19 50.3 kg  01/26/19 52.2 kg     Intake/Output Summary (Last 24 hours) at 04/04/2019 1623 Last data filed at 04/04/2019 1406 Gross per 24 hour  Intake 1353.41 ml  Output 2 ml  Net 1351.41 ml     Physical Exam  Gen: not in distress HEENT: Pallor present, moist mucosa, supple neck Chest: clear b/l, no added sounds CVS: N S1&S2, no murmurs,  GI: soft, NT, ND, BS+ Musculoskeletal: warm, no edema CNS: Alert and oriented, mild tremors    Data Review:    CBC Recent Labs  Lab 04/03/19 1227 04/03/19 1903 04/04/19 0430  WBC 3.0*  --  5.4  HGB 11.7* 11.4* 10.5*  HCT 34.9* 32.8* 30.6*  PLT 63*  --  54*  MCV 103.6*  --  103.4*  MCH 34.7*  --  35.1*  MCHC 33.5  --  34.0  RDW 14.1  --  14.1   LYMPHSABS 0.9  --   --   MONOABS 0.3  --   --   EOSABS 0.1  --   --   BASOSABS 0.1  --   --     Chemistries  Recent Labs  Lab 04/03/19 1227 04/03/19 2037 04/04/19 0430  NA 142  --  138  K 3.9  --  3.4*  CL 104  --  103  CO2 28  --  21*  GLUCOSE 159*  --  133*  BUN 12  --  10  CREATININE 0.48  --  0.31*  CALCIUM 10.1  --  9.3  MG  --  1.4*  --   AST 105*  --   --   ALT 52*  --   --   ALKPHOS 136*  --   --   BILITOT 5.5*  --   --    ------------------------------------------------------------------------------------------------------------------ No results for input(s): CHOL, HDL, LDLCALC, TRIG, CHOLHDL, LDLDIRECT in the last 72 hours.  Lab Results  Component Value Date   HGBA1C 3.7 (L) 08/29/2018   ------------------------------------------------------------------------------------------------------------------ No results for input(s): TSH, T4TOTAL, T3FREE, THYROIDAB in the last 72 hours.  Invalid input(s): FREET3 ------------------------------------------------------------------------------------------------------------------ Recent Labs    04/04/19 0430  FOLATE 15.0  FERRITIN 301  TIBC 227*  IRON 207*    Coagulation profile Recent Labs  Lab 04/03/19 1227  INR 1.5*    No results for input(s): DDIMER in the last 72 hours.  Cardiac Enzymes No results  for input(s): CKMB, TROPONINI, MYOGLOBIN in the last 168 hours.  Invalid input(s): CK ------------------------------------------------------------------------------------------------------------------ No results found for: BNP  Inpatient Medications  Scheduled Meds: . folic acid  1 mg Oral Daily  . LORazepam  0-4 mg Intravenous Q6H   Followed by  . [START ON 04/05/2019] LORazepam  0-4 mg Intravenous Q12H  . multivitamin with minerals  1 tablet Oral Daily  . nadolol  20 mg Oral Daily  . pantoprazole  40 mg Oral Daily  . polyethylene glycol-electrolytes  4,000 mL Oral Once  . rifaximin  550 mg Oral  BID  . thiamine  100 mg Oral Daily   Continuous Infusions: . sodium chloride 100 mL/hr at 04/04/19 1406  . sodium chloride    . cefTRIAXone (ROCEPHIN)  IV Stopped (04/04/19 1324)  . magnesium sulfate bolus IVPB 4 g (04/04/19 1534)   PRN Meds:.sodium chloride, acetaminophen **OR** acetaminophen, lactulose, LORazepam **OR** LORazepam, ondansetron **OR** ondansetron (ZOFRAN) IV, polyethylene glycol  Micro Results Recent Results (from the past 240 hour(s))  SARS CORONAVIRUS 2 (TAT 6-24 HRS) Nasopharyngeal Nasopharyngeal Swab     Status: None   Collection Time: 04/03/19  4:33 PM   Specimen: Nasopharyngeal Swab  Result Value Ref Range Status   SARS Coronavirus 2 NEGATIVE NEGATIVE Final    Comment: (NOTE) SARS-CoV-2 target nucleic acids are NOT DETECTED. The SARS-CoV-2 RNA is generally detectable in upper and lower respiratory specimens during the acute phase of infection. Negative results do not preclude SARS-CoV-2 infection, do not rule out co-infections with other pathogens, and should not be used as the sole basis for treatment or other patient management decisions. Negative results must be combined with clinical observations, patient history, and epidemiological information. The expected result is Negative. Fact Sheet for Patients: SugarRoll.be Fact Sheet for Healthcare Providers: https://www.woods-mathews.com/ This test is not yet approved or cleared by the [place] FDA and  has been authorized for detection and/or diagnosis of SARS-CoV-2 by FDA under an Emergency Use Authorization (EUA). This EUA will remain  in effect (meaning this test can be used) for the duration of the COVID-19 declaration under Section 56 4(b)(1) of the Act, 21 U.S.C. section 360bbb-3(b)(1), unless the authorization is terminated or revoked sooner. Performed at [facility], [place] [address] [place], [place] [address]     Radiology Reports US LIVER  DOPPLER  Result Date: 04/04/2019 CLINICAL DATA:  History of transjugular intrahepatic portosystemic shunt. EXAM: DUPLEX ULTRASOUND OF LIVER AND TIPS SHUNT TECHNIQUE: Color and duplex Doppler ultrasound was performed to evaluate the hepatic in-flow and out-flow vessels. COMPARISON:  CT 11/13/2018 FINDINGS: Portal Vein Velocities Right:  25 cm/sec Left:  10 cm/sec TIPS Stent Velocities Portal end: 71 cm/sec Mid:  100 cm/sec Hepatic end: 118 cm/sec IVC: Patent with normal phasicity. Peak systolic velocity is 45 cm/sec. Present and patent with normal respirator Hepatic Vein Velocities Right:  34 cm/sec Mid:  89 cm/sec Left:  31 cm/sec Splenic Vein: 17 cm/sec Hepatic Artery: 157 Ascities: Absent Varices: Absent Other findings: Main portal vein measures 0.9 cm in diameter. Gallbladder is moderately distended and there may be gallbladder sludge. Increased echogenicity in the liver compared to the right kidney. IMPRESSION: 1. TIPS stent is patent.  No evidence for focal stenosis. 2. Increased echogenicity in the liver. 3. Negative for ascites. 4. Moderate gallbladder distension.  Question gallbladder sludge. Electronically Signed   By: Markus Daft M.D.   On: 04/04/2019 09:56    Time Spent in minutes 82   Lenell Lama M.D  on 04/04/2019 at 4:23 PM  Between 7am to 7pm - Pager - [phone]  After 7pm go to [email or web address] - password [facility]  [facility] -  Office  [phone]

## 2019-04-04 NOTE — Plan of Care (Signed)
Continuing with plan of care. 

## 2019-04-04 NOTE — Consult Note (Signed)
Cephas Darby, MD [address]  [place]  [place], [place] [address]  Main: [phone]  Fax: [phone] Pager: [phone]   Consultation  Referring Provider:     No ref. provider found Primary Care Physician:  Rusty Aus, MD Primary Gastroenterologist:  Dr. Vicente Males       Reason for Consultation: Rectal bleeding  Date of Admission:  04/03/2019 Date of Consultation:  04/04/2019         HPI:   Lisa Dennis is a 54 y.o. 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  Patient is taking lactulose at home to prevent hepatic encephalopathy, she also has hyperbilirubinemia secondary to decompensated alcoholic cirrhosis.  She came to ER yesterday due to an episode of scant amounts of bright red blood per rectum.  She reports that she has been having constipation and takes MiraLAX in addition to lactulose as needed.  She said she has been going through stress and with self quarantine, she felt depressed, therefore resumed drinking alcohol about 2 weeks ago.  She reports that she has been drinking about 5 glasses of wine daily.  Patient had another episode of rectal bleeding in the ER which was witnessed by the hospitalist, was bright red blood in the toilet bowel which was small amount only.  She otherwise denies melena, nausea, abdominal pain, abdominal distention, vomiting. Patient has been hemodynamically stable since admission.  Her last normal hemoglobin was 12.2 on 01/30/2019.  11.7 on arrival, decreased to 11.5 today, MCV 103.4.  Platelets 54, INR 1.5, total bilirubin 5.5, AST 105, ALT 52, AP 136, albumin 3.8 Today, a quarter sized blood-tinged spot was noted by the nurse in her  urine. Patient is empirically started on octreotide drip and received ceftriaxone for SBP prophylaxis She underwent ultrasound Doppler today which revealed patent TIPS.,  No evidence of ascites  NSAIDs: None  Antiplts/Anticoagulants/Anti thrombotics: None  GI Procedures: EGD 11/12/2018 - Normal examined duodenum. - Grade I esophageal varices. - Portal hypertensive gastropathy. - No specimens collected.  EGD 06/08/2018 - Medium-sized hiatal hernia. - Grade I esophageal varices. - Portal hypertensive gastropathy. - Erythematous duodenopathy. - No specimens collected.   Past Medical History:  Diagnosis Date  . Alcohol abuse   . Anxiety   . Cirrhosis [facility])     Past Surgical History:  Procedure Laterality Date  . ESOPHAGOGASTRODUODENOSCOPY (EGD) WITH PROPOFOL N/A 06/08/2018   Procedure: ESOPHAGOGASTRODUODENOSCOPY (EGD) WITH PROPOFOL;  Surgeon: Lucilla Lame, MD;  Location: [facility] ENDOSCOPY;  Service: Endoscopy;  Laterality: N/A;  . ESOPHAGOGASTRODUODENOSCOPY (EGD) WITH PROPOFOL N/A 11/12/2018   Procedure: ESOPHAGOGASTRODUODENOSCOPY (EGD) WITH PROPOFOL;  Surgeon: Jonathon Bellows, MD;  Location: [facility] ENDOSCOPY;  Service: Gastroenterology;  Laterality: N/A;    Prior to Admission medications   Medication Sig Start Date End Date Taking? Authorizing Provider  ciprofloxacin (CIPRO) 500 MG tablet Take 500 mg by mouth daily. 11/27/18  Yes [provider]  folic acid (FOLVITE) 1 MG tablet Take 1 tablet (1 mg total) by mouth daily. 01/30/19  Yes Carrie Mew, MD  nadolol (CORGARD) 20 MG tablet Take 1 tablet (20 mg total) by mouth daily. 01/30/19 04/30/19 Yes Carrie Mew, MD  pantoprazole (PROTONIX) 40 MG tablet Take 1 tablet (40 mg total) by mouth daily. 01/30/19 04/30/19  Yes Carrie Mew, MD  polyethylene glycol powder (GLYCOLAX/MIRALAX) 17 GM/SCOOP powder Take 17 g by mouth daily.   Yes [provider]  rifaximin (XIFAXAN) 550 MG TABS tablet Take 1 tablet (550 mg total) by  mouth 2 (two) times daily. 01/30/19 04/30/19 Yes Carrie Mew, MD  thiamine 100 MG tablet Take 1 tablet (100 mg total) by mouth daily. 01/30/19  Yes Carrie Mew, MD  venlafaxine XR (EFFEXOR-XR) 37.5 MG 24 hr capsule Take 37.5 mg by mouth daily. 12/11/18  Yes [provider]  cefTRIAXone 1 g in sodium chloride 0.9 % 100 mL Inject 1 g into the vein daily. Patient not taking: Reported on 04/03/2019 11/14/18   Awilda Bill, NP  lactulose (CHRONULAC) 10 GM/15ML solution Take 15 mLs (10 g total) by mouth 4 (four) times daily as needed for mild constipation (take as needed to achieve 3-4 bowel movements a day). Patient not taking: Reported on 04/03/2019 01/30/19   Carrie Mew, MD  mupirocin ointment (BACTROBAN) 2 % Place 1 application into the nose 2 (two) times daily. Patient not taking: Reported on 04/03/2019 11/13/18   Awilda Bill, NP  octreotide 500 mcg in sodium chloride 0.9 % 250 mL Inject 50 mcg/hr into the vein continuous. Patient not taking: Reported on 04/03/2019 11/23/18   Elmarie Shiley, MD    Current Facility-Administered Medications:  .  0.9 %  sodium chloride infusion, , Intravenous, PRN, Dhungel, Nishant, MD, Last Rate: 100 mL/hr at 04/04/19 1406, Rate Verify at 04/04/19 1406 .  0.9 %  sodium chloride infusion, , Intravenous, Continuous, Canyon Lohr, Tally Due, MD .  acetaminophen (TYLENOL) tablet 650 mg, 650 mg, Oral, Q8H PRN **OR** acetaminophen (TYLENOL) suppository 650 mg, 650 mg, Rectal, Q8H PRN, Jennye Boroughs, MD .  cefTRIAXone (ROCEPHIN) 2 g in sodium chloride 0.9 % 100 mL IVPB, 2 g, Intravenous, Q24H, Dhungel, Nishant, MD, Stopped at 04/04/19 1324 .  folic acid (FOLVITE) tablet 1 mg, 1 mg, Oral, Daily, Jennye Boroughs, MD, 1 mg at 04/04/19 0854 .  lactulose (CHRONULAC) 10 GM/15ML solution 10 g, 10 g, Oral, QID PRN, Jennye Boroughs, MD, 10 g at 04/03/19 2139 .  LORazepam (ATIVAN) injection 0-4 mg, 0-4 mg, Intravenous, Q6H, 1 mg at 04/04/19 0906 **FOLLOWED BY**  [START ON 04/05/2019] LORazepam (ATIVAN) injection 0-4 mg, 0-4 mg, Intravenous, Q12H, Jennye Boroughs, MD .  LORazepam (ATIVAN) tablet 1-4 mg, 1-4 mg, Oral, Q1H PRN, 2 mg at 04/03/19 2139 **OR** LORazepam (ATIVAN) injection 1-4 mg, 1-4 mg, Intravenous, Q1H PRN, Jennye Boroughs, MD, 1 mg at 04/04/19 0427 .  magnesium sulfate IVPB 4 g 100 mL, 4 g, Intravenous, Once, Dhungel, Nishant, MD, Last Rate: 50 mL/hr at 04/04/19 1534, 4 g at 04/04/19 1534 .  multivitamin with minerals tablet 1 tablet, 1 tablet, Oral, Daily, Jennye Boroughs, MD, 1 tablet at 04/04/19 0848 .  nadolol (CORGARD) tablet 20 mg, 20 mg, Oral, Daily, Jennye Boroughs, MD, 20 mg at 04/04/19 0853 .  ondansetron (ZOFRAN) tablet 4 mg, 4 mg, Oral, Q6H PRN **OR** ondansetron (ZOFRAN) injection 4 mg, 4 mg, Intravenous, Q6H PRN, Jennye Boroughs, MD .  pantoprazole (PROTONIX) EC tablet 40 mg, 40 mg, Oral, Daily, Jennye Boroughs, MD, 40 mg at 04/04/19 0852 .  polyethylene glycol (MIRALAX / GLYCOLAX) packet 17 g, 17 g, Oral, Daily PRN, Jennye Boroughs, MD, 17 g at 04/03/19 2139 .  polyethylene glycol-electrolytes (NuLYTELY) solution 4,000 mL, 4,000 mL, Oral, Once, Audy Dauphine, Tally Due, MD .  rifaximin Doreene Nest) tablet 550 mg, 550 mg, Oral,  BID, Jennye Boroughs, MD, 550 mg at 04/04/19 0853 .  thiamine tablet 100 mg, 100 mg, Oral, Daily, Jennye Boroughs, MD, 100 mg at 04/04/19 4008  History reviewed. No pertinent family history.   Social History   Tobacco Use  . Smoking status: Former Research scientist (life sciences)  . Smokeless tobacco: Never Used  Substance Use Topics  . Alcohol use: Yes    Alcohol/week: 10.0 standard drinks    Types: 10 Glasses of wine per week    Comment: 5 glasses yesterday  . Drug use: Never    Allergies as of 04/03/2019 - Review Complete 04/03/2019  Allergen Reaction Noted  . Benadryl [diphenhydramine] Other (See Comments) 05/20/2018  . Venlafaxine hcl er Itching 01/30/2019  . Penicillins Hives and Other (See Comments) 05/20/2018  .  Clindamycin/lincomycin Swelling 01/30/2019  . Ciprofloxacin Itching 11/13/2018  . Latex Hives 10/19/2018  . Morphine and related Hives 05/20/2018  . Sulfa antibiotics Hives 05/20/2018  . Tetracyclines & related Hives 05/20/2018    Review of Systems:    All systems reviewed and negative except where noted in HPI.   Physical Exam:  Vital signs in last 24 hours: Temp:  [98.8 F (37.1 C)-100.3 F (37.9 C)] 98.8 F (37.1 C) (01/10 1454) Pulse Rate:  [65-90] 65 (01/10 1454) Resp:  [14-18] 18 (01/10 1454) BP: (123-150)/(76-85) 129/77 (01/10 1454) SpO2:  [94 %-99 %] 99 % (01/10 1454) Last BM Date: 03/30/19 General:   Pleasant, cooperative in NAD Head:  Normocephalic and atraumatic. Eyes:   No icterus.   Conjunctiva pink. PERRLA. Ears:  Normal auditory acuity. Neck:  Supple; no masses or thyroidomegaly Lungs: Respirations even and unlabored. Lungs clear to auscultation bilaterally.   No wheezes, crackles, or rhonchi.  Heart:  Regular rate and rhythm;  Without murmur, clicks, rubs or gallops Abdomen:  Soft, nondistended, nontender. Normal bowel sounds. No appreciable masses or hepatomegaly.  No rebound or guarding.  Rectal:  Not performed. Msk:  Symmetrical without gross deformities.  Strength generalized weakness Extremities:  Without edema, cyanosis or clubbing. Neurologic:  Alert and oriented x3;  grossly normal neurologically. Skin:  Intact without significant lesions or rashes. Psych:  Alert and cooperative. Normal affect.  LAB RESULTS: CBC Latest Ref Rng & Units 04/04/2019 04/03/2019 04/03/2019  WBC 4.0 - 10.5 K/uL 5.4 - 3.0(L)  Hemoglobin 12.0 - 15.0 g/dL 10.5(L) 11.4(L) 11.7(L)  Hematocrit 36.0 - 46.0 % 30.6(L) 32.8(L) 34.9(L)  Platelets 150 - 400 K/uL 54(L) - 63(L)    BMET BMP Latest Ref Rng & Units 04/04/2019 04/03/2019 01/30/2019  Glucose 70 - 99 mg/dL 133(H) 159(H) 119(H)  BUN 6 - 20 mg/dL '10 12 13  ' Creatinine 0.44 - 1.00 mg/dL 0.31(L) 0.48 0.49  Sodium 135 - 145 mmol/L 138  142 134(L)  Potassium 3.5 - 5.1 mmol/L 3.4(L) 3.9 3.2(L)  Chloride 98 - 111 mmol/L 103 104 94(L)  CO2 22 - 32 mmol/L 21(L) 28 30  Calcium 8.9 - 10.3 mg/dL 9.3 10.1 11.3(H)    LFT Hepatic Function Latest Ref Rng & Units 04/03/2019 01/30/2019 01/26/2019  Total Protein 6.5 - 8.1 g/dL 8.2(H) 8.4(H) 8.4(H)  Albumin 3.5 - 5.0 g/dL 3.8 3.6 3.4(L)  AST 15 - 41 U/L 105(H) 75(H) 174(H)  ALT 0 - 44 U/L 52(H) 51(H) 75(H)  Alk Phosphatase 38 - 126 U/L 136(H) 119 132(H)  Total Bilirubin 0.3 - 1.2 mg/dL 5.5(H) 10.6(H) 8.1(H)  Bilirubin, Direct 0.0 - 0.2 mg/dL - - 2.7(H)     STUDIES: US LIVER DOPPLER  Result Date: 04/04/2019  CLINICAL DATA:  History of transjugular intrahepatic portosystemic shunt. EXAM: DUPLEX ULTRASOUND OF LIVER AND TIPS SHUNT TECHNIQUE: Color and duplex Doppler ultrasound was performed to evaluate the hepatic in-flow and out-flow vessels. COMPARISON:  CT 11/13/2018 FINDINGS: Portal Vein Velocities Right:  25 cm/sec Left:  10 cm/sec TIPS Stent Velocities Portal end: 71 cm/sec Mid:  100 cm/sec Hepatic end: 118 cm/sec IVC: Patent with normal phasicity. Peak systolic velocity is 45 cm/sec. Present and patent with normal respirator Hepatic Vein Velocities Right:  34 cm/sec Mid:  89 cm/sec Left:  31 cm/sec Splenic Vein: 17 cm/sec Hepatic Artery: 157 Ascities: Absent Varices: Absent Other findings: Main portal vein measures 0.9 cm in diameter. Gallbladder is moderately distended and there may be gallbladder sludge. Increased echogenicity in the liver compared to the right kidney. IMPRESSION: 1. TIPS stent is patent.  No evidence for focal stenosis. 2. Increased echogenicity in the liver. 3. Negative for ascites. 4. Moderate gallbladder distension.  Question gallbladder sludge. Electronically Signed   By: Markus Daft M.D.   On: 04/04/2019 09:56      Impression / Plan:   Lisa Dennis is a 54 y.o. female with alcoholic decompensated cirrhosis, intraperitoneal bleed secondary to varices s/p  TIPS in 11/2018 at [facility] admitted with hemodynamically insignificant rectal bleeding resulting in mild macrocytic anemia.  Patient resumed drinking alcohol 2 weeks ago  Rectal bleeding: Mild Unlikely upper GI bleed, TIPS is patent on ultrasound Dopplers today Okay to discontinue octreotide drip Recommend colonoscopy for further evaluation, patient is willing to undergo tomorrow Clear liquid diet Bowel prep today, n.p.o. past midnight  chronic alcoholic liver disease Multivitamin + thiamine plus folate Adequate nutrition Recheck iron panel, B12 and folate levels Complete abstinence from alcohol Continue lactulose and rifaximin to prevent hepatic encephalopathy Follow-up with GI as outpatient  Thank you for involving me in the care of this patient.  GI will follow along with you    LOS: 1 day   Sherri Sear, MD  04/04/2019, 3:59 PM   Note: This dictation was prepared with Dragon dictation along with smaller phrase technology. Any transcriptional errors that result from this process are unintentional.

## 2019-04-05 ENCOUNTER — Encounter
Admission: EM | Disposition: A | Discharge status: Home / Self Care | Payer: Self-pay | Source: Home / Self Care | Attending: Emergency Medicine

## 2019-04-05 DIAGNOSIS — F10230 Alcohol dependence with withdrawal, uncomplicated: Secondary | ICD-10-CM | POA: Diagnosis not present

## 2019-04-05 DIAGNOSIS — K922 Gastrointestinal hemorrhage, unspecified: Secondary | ICD-10-CM | POA: Diagnosis not present

## 2019-04-05 LAB — COMPREHENSIVE METABOLIC PANEL
ALT: 36 U/L (ref 0–44)
AST: 58 U/L — ABNORMAL HIGH (ref 15–41)
Albumin: 2.9 g/dL — ABNORMAL LOW (ref 3.5–5.0)
Alkaline Phosphatase: 81 U/L (ref 38–126)
Anion gap: 10 (ref 5–15)
BUN: 13 mg/dL (ref 6–20)
CO2: 26 mmol/L (ref 22–32)
Calcium: 8.7 mg/dL — ABNORMAL LOW (ref 8.9–10.3)
Chloride: 100 mmol/L (ref 98–111)
Creatinine, Ser: 0.43 mg/dL — ABNORMAL LOW (ref 0.44–1.00)
GFR calc Af Amer: >60 mL/min (ref >60)
GFR calc non Af Amer: >60 mL/min (ref >60)
Glucose, Bld: 101 mg/dL — ABNORMAL HIGH (ref 70–99)
Potassium: 3.3 mmol/L — ABNORMAL LOW (ref 3.5–5.1)
Sodium: 136 mmol/L (ref 135–145)
Total Bilirubin: 11.4 mg/dL — ABNORMAL HIGH (ref 0.3–1.2)
Total Protein: 6.8 g/dL (ref 6.5–8.1)

## 2019-04-05 LAB — CBC
HCT: 29.1 % — ABNORMAL LOW (ref 36.0–46.0)
Hemoglobin: 10.1 g/dL — ABNORMAL LOW (ref 12.0–15.0)
MCH: 35.7 pg — ABNORMAL HIGH (ref 26.0–34.0)
MCHC: 34.7 g/dL (ref 30.0–36.0)
MCV: 102.8 fL — ABNORMAL HIGH (ref 80.0–100.0)
Platelets: 59 10*3/uL — ABNORMAL LOW (ref 150–400)
RBC: 2.83 MIL/uL — ABNORMAL LOW (ref 3.87–5.11)
RDW: 13.8 % (ref 11.5–15.5)
WBC: 5.2 10*3/uL (ref 4.0–10.5)
nRBC: 0 % (ref 0.0–0.2)

## 2019-04-05 LAB — SURGICAL PCR SCREEN
MRSA, PCR: NEGATIVE
Staphylococcus aureus: NEGATIVE

## 2019-04-05 SURGERY — COLONOSCOPY WITH PROPOFOL
Anesthesia: General

## 2019-04-05 MED ORDER — MUPIROCIN 2 % EX OINT
1.0000 "application " | TOPICAL_OINTMENT | Freq: Two times a day (BID) | CUTANEOUS | Status: DC
  Filled 2019-04-05: qty 22

## 2019-04-05 MED ORDER — POTASSIUM CHLORIDE CRYS ER 20 MEQ PO TBCR
40.0000 meq | EXTENDED_RELEASE_TABLET | Freq: Once | ORAL | Status: AC
  Administered 2019-04-05: 40 meq via ORAL
  Filled 2019-04-05: qty 2

## 2019-04-05 MED ORDER — MAGNESIUM CITRATE PO SOLN
1.0000 | Freq: Once | ORAL | Status: AC
  Administered 2019-04-05: 1 via ORAL
  Filled 2019-04-05: qty 296

## 2019-04-05 MED ORDER — POLYETHYLENE GLYCOL 3350 17 GM/SCOOP PO POWD
1.0000 | Freq: Once | ORAL | Status: AC
  Administered 2019-04-05: 255 g via ORAL
  Filled 2019-04-05: qty 255

## 2019-04-05 MED ORDER — PROMETHAZINE HCL 25 MG/ML IJ SOLN
25.0000 mg | Freq: Three times a day (TID) | INTRAMUSCULAR | Status: DC | PRN
  Administered 2019-04-05: 25 mg via INTRAVENOUS
  Filled 2019-04-05: qty 1

## 2019-04-05 NOTE — Progress Notes (Signed)
PROGRESS NOTE                                                                                                                                                                                                             Patient Demographics:    Lisa Dennis, is a 54 y.o. female, DOB - [birth year], [place]:[id number]  Admit date - 04/03/2019   Admitting Physician Jennye Boroughs, MD  Outpatient Primary MD for the patient is Rusty Aus, MD  LOS - 2   Chief Complaint  Patient presents with  . Rectal Bleeding       Brief Narrative 54 year old female with ongoing alcohol use with liver cirrhosis, history of esophageal varices bleeding s/p banding and TI PS in August 2020 at [facility], hemorrhoids, anxiety presented to the ED with several episodes of bright red bleeding per rectum.  In the ED vitals were stable.  Hemoglobin dropped less than 1 g from her baseline, INR of 1.5 and platelets of 60 3K.  Admitted for further management.   Subjective:   Reported small amount of blood per rectum last night.  No abdominal pain or hematemesis.   Assessment  & Plan :   Principal problem Acute GI bleed Mild symptoms, no further drop in H&H.  GI suspects this is lower GI bleed due to hemorrhoids.  Ultrasound with Doppler shows patent TI PS.  Octreotide drip discontinued.  Could not tolerate bowel prep yesterday.  Plan for colonoscopy tomorrow and prep started this morning.  H&H stable.   Active Problems:   Alcoholic cirrhosis of liver without ascites (HCC) Ongoing heavy alcohol use (reports drinking 4-5 glass of Chardonnay daily).  High risk of going into DTs.  CIWA of 4.  Continue thiamine folate and multivitamin. Iron panel, folate and B12 normal. Counseled strongly on alcohol cessation.  Continue lactulose and rifaximin.  Leukopenia and thrombocytopenia Secondary to decompensated liver disease.   Monitor  Hypokalemia/hypomagnesemia Replenished  Generalized weakness PT evaluation.   Code Status : Full code  Family Communication  : None  Disposition Plan  : Home possibly tomorrow after colonoscopy and stable.  Barriers For Discharge : Active symptoms (lower GI bleed, anemia, alcohol withdrawal)  Consults  : GI  Procedures  : Ultrasound Doppler of the abdomen  DVT Prophylaxis  : SCDs  Lab Results  Component Value Date  PLT 59 (L) 04/05/2019    Antibiotics  :    Anti-infectives (From admission, onward)   Start     Dose/Rate Route Frequency Ordered Stop   04/04/19 0800  cefTRIAXone (ROCEPHIN) 2 g in sodium chloride 0.9 % 100 mL IVPB     2 g 200 mL/hr over 30 Minutes Intravenous Every 24 hours 04/04/19 0725     04/03/19 2200  rifaximin (XIFAXAN) tablet 550 mg     550 mg Oral 2 times daily 04/03/19 1735          Objective:   Vitals:   04/04/19 1805 04/04/19 2055 04/05/19 0411 04/05/19 0912  BP: 139/74 (!) 158/86 124/71 128/90  Pulse: 64 66 (!) 59 70  Resp: 16 20 20 17   Temp: 98.6 F (37 C) 98.3 F (36.8 C) 98.8 F (37.1 C) 98.2 F (36.8 C)  TempSrc: Oral Oral Oral Oral  SpO2: 97% 94% 98% 99%  Weight:      Height:        Wt Readings from Last 3 Encounters:  04/03/19 52.2 kg  01/30/19 50.3 kg  01/26/19 52.2 kg     Intake/Output Summary (Last 24 hours) at 04/05/2019 1110 Last data filed at 04/05/2019 0939 Gross per 24 hour  Intake 2056.42 ml  Output 600 ml  Net 1456.42 ml   Physical exam Not in distress HEENT: Moist mucosa, supple neck Chest: Clear CVs: Normal S1-S2 GI: Soft, nondistended, nontender Musculoskeletal: Warm, no edema CNS: Alert and oriented, no tremors      Data Review:    CBC Recent Labs  Lab 04/03/19 1227 04/03/19 1903 04/04/19 0430 04/05/19 0446  WBC 3.0*  --  5.4 5.2  HGB 11.7* 11.4* 10.5* 10.1*  HCT 34.9* 32.8* 30.6* 29.1*  PLT 63*  --  54* 59*  MCV 103.6*  --  103.4* 102.8*  MCH 34.7*  --  35.1* 35.7*   MCHC 33.5  --  34.0 34.7  RDW 14.1  --  14.1 13.8  LYMPHSABS 0.9  --   --   --   MONOABS 0.3  --   --   --   EOSABS 0.1  --   --   --   BASOSABS 0.1  --   --   --     Chemistries  Recent Labs  Lab 04/03/19 1227 04/03/19 2037 04/04/19 0430 04/05/19 0446  NA 142  --  138 136  K 3.9  --  3.4* 3.3*  CL 104  --  103 100  CO2 28  --  21* 26  GLUCOSE 159*  --  133* 101*  BUN 12  --  10 13  CREATININE 0.48  --  0.31* 0.43*  CALCIUM 10.1  --  9.3 8.7*  MG  --  1.4*  --   --   AST 105*  --   --  58*  ALT 52*  --   --  36  ALKPHOS 136*  --   --  81  BILITOT 5.5*  --   --  11.4*   ------------------------------------------------------------------------------------------------------------------ No results for input(s): CHOL, HDL, LDLCALC, TRIG, CHOLHDL, LDLDIRECT in the last 72 hours.  Lab Results  Component Value Date   HGBA1C 3.7 (L) 08/29/2018   ------------------------------------------------------------------------------------------------------------------ No results for input(s): TSH, T4TOTAL, T3FREE, THYROIDAB in the last 72 hours.  Invalid input(s): FREET3 ------------------------------------------------------------------------------------------------------------------ Recent Labs    04/04/19 0430  VITAMINB12 479  FOLATE 15.0  FERRITIN 301  TIBC 227*  IRON 207*  Coagulation profile Recent Labs  Lab 04/03/19 1227  INR 1.5*    No results for input(s): DDIMER in the last 72 hours.  Cardiac Enzymes No results for input(s): CKMB, TROPONINI, MYOGLOBIN in the last 168 hours.  Invalid input(s): CK ------------------------------------------------------------------------------------------------------------------ No results found for: BNP  Inpatient Medications  Scheduled Meds: . folic acid  1 mg Oral Daily  . LORazepam  0-4 mg Intravenous Q6H   Followed by  . LORazepam  0-4 mg Intravenous Q12H  . multivitamin with minerals  1 tablet Oral Daily  .  nadolol  20 mg Oral Daily  . pantoprazole  40 mg Oral Daily  . polyethylene glycol powder  1 Container Oral Once  . rifaximin  550 mg Oral BID  . thiamine  100 mg Oral Daily   Continuous Infusions: . sodium chloride Stopped (04/04/19 1811)  . sodium chloride 20 mL/hr at 04/04/19 1854  . cefTRIAXone (ROCEPHIN)  IV 2 g (04/05/19 0919)   PRN Meds:.sodium chloride, acetaminophen **OR** acetaminophen, lactulose, LORazepam **OR** LORazepam, ondansetron **OR** ondansetron (ZOFRAN) IV, polyethylene glycol  Micro Results Recent Results (from the past 240 hour(s))  SARS CORONAVIRUS 2 (TAT 6-24 HRS) Nasopharyngeal Nasopharyngeal Swab     Status: None   Collection Time: 04/03/19  4:33 PM   Specimen: Nasopharyngeal Swab  Result Value Ref Range Status   SARS Coronavirus 2 NEGATIVE NEGATIVE Final    Comment: (NOTE) SARS-CoV-2 target nucleic acids are NOT DETECTED. The SARS-CoV-2 RNA is generally detectable in upper and lower respiratory specimens during the acute phase of infection. Negative results do not preclude SARS-CoV-2 infection, do not rule out co-infections with other pathogens, and should not be used as the sole basis for treatment or other patient management decisions. Negative results must be combined with clinical observations, patient history, and epidemiological information. The expected result is Negative. Fact Sheet for Patients: SugarRoll.be Fact Sheet for Healthcare Providers: https://www.woods-mathews.com/ This test is not yet approved or cleared by the [place] FDA and  has been authorized for detection and/or diagnosis of SARS-CoV-2 by FDA under an Emergency Use Authorization (EUA). This EUA will remain  in effect (meaning this test can be used) for the duration of the COVID-19 declaration under Section 56 4(b)(1) of the Act, 21 U.S.C. section 360bbb-3(b)(1), unless the authorization is terminated or revoked  sooner. Performed at [facility], [place] [address], [place], [place] [address]     Radiology Reports US LIVER DOPPLER  Result Date: 04/04/2019 CLINICAL DATA:  History of transjugular intrahepatic portosystemic shunt. EXAM: DUPLEX ULTRASOUND OF LIVER AND TIPS SHUNT TECHNIQUE: Color and duplex Doppler ultrasound was performed to evaluate the hepatic in-flow and out-flow vessels. COMPARISON:  CT 11/13/2018 FINDINGS: Portal Vein Velocities Right:  25 cm/sec Left:  10 cm/sec TIPS Stent Velocities Portal end: 71 cm/sec Mid:  100 cm/sec Hepatic end: 118 cm/sec IVC: Patent with normal phasicity. Peak systolic velocity is 45 cm/sec. Present and patent with normal respirator Hepatic Vein Velocities Right:  34 cm/sec Mid:  89 cm/sec Left:  31 cm/sec Splenic Vein: 17 cm/sec Hepatic Artery: 157 Ascities: Absent Varices: Absent Other findings: Main portal vein measures 0.9 cm in diameter. Gallbladder is moderately distended and there may be gallbladder sludge. Increased echogenicity in the liver compared to the right kidney. IMPRESSION: 1. TIPS stent is patent.  No evidence for focal stenosis. 2. Increased echogenicity in the liver. 3. Negative for ascites. 4. Moderate gallbladder distension.  Question gallbladder sludge. Electronically Signed   By: Markus Daft  M.D.   On: 04/04/2019 09:56    Time Spent in minutes 25   Luverta Korte M.D on 04/05/2019 at 11:10 AM  Between 7am to 7pm - Pager - [phone]  After 7pm go to [email or web address] - password [facility]  [facility] -  Office  [phone]

## 2019-04-05 NOTE — Evaluation (Signed)
Physical Therapy Evaluation Patient Details Name: Lisa Dennis MRN: [id number] DOB: Mar 24, 1966 Today's Date: 04/05/2019   History of Present Illness  Per MD note: Pt is a 54 y.o. female with medical history significant for alcohol abuse, liver cirrhosis, history of esophageal variceal bleeding status post banding and TIPS in August at [facility], hemorrhoids, anxiety, presented to the hospital because of bleeding per rectum.  MD assessment includes: Acute GI bleed, Alcoholic cirrhosis of liver without ascites, CIWA of 4, Leukopenia and thrombocytopenia, Hypokalemia/hypomagnesemia, and general weakness.    Clinical Impression  Pt presented with deficits in strength, transfers, mobility, gait, balance, and activity tolerance.  Pt was Ind with bed mobility tasks with good effort and control and SBA with transfers.  Pt was able to amb 2 x 100' without an AD but did present with minor instability that she was able to self-correct and was mildly tremulous to BUE/BLEs throughout.  Pt's SpO2 and HR were WNL during the session with no adverse symptoms noted by pt.  Pt will benefit from HHPT services upon discharge to safely address above deficits for decreased caregiver assistance and eventual return to PLOF.       Follow Up Recommendations Home health PT    Equipment Recommendations  None recommended by PT    Recommendations for Other Services       Precautions / Restrictions Precautions Precautions: Fall Restrictions Weight Bearing Restrictions: No      Mobility  Bed Mobility Overal bed mobility: Independent                Transfers Overall transfer level: Needs assistance Equipment used: None Transfers: Sit to/from Stand Sit to Stand: Supervision         General transfer comment: Good eccentric and concentric control  Ambulation/Gait   Gait Distance (Feet): 100 Feet x 2 Assistive device: None Gait Pattern/deviations: Step-through pattern;Decreased  step length - left;Decreased step length - right;Drifts right/left Gait velocity: decreased   General Gait Details: Min instability including mild drifting left/right most notably during turns but pt able to self-correct  Stairs            Wheelchair Mobility    Modified Rankin (Stroke Patients Only)       Balance Overall balance assessment: Needs assistance   Sitting balance-Leahy Scale: Normal     Standing balance support: No upper extremity supported Standing balance-Leahy Scale: Fair                               Pertinent Vitals/Pain Pain Assessment: No/denies pain    Home Living Family/patient expects to be discharged to:: Private residence Living Arrangements: Alone Available Help at Discharge: Friend(s);Available PRN/intermittently Type of Home: House Home Access: Stairs to enter Entrance Stairs-Rails: Right Entrance Stairs-Number of Steps: 10 Home Layout: One level Home Equipment: Grab bars - tub/shower;Grab bars - toilet      Prior Function Level of Independence: Independent         Comments: Ind Amb community distances without an AD, no falls in the last 6 months, Ind with ADLs     Hand Dominance   Dominant Hand: Right    Extremity/Trunk Assessment   Upper Extremity Assessment Upper Extremity Assessment: Generalized weakness    Lower Extremity Assessment Lower Extremity Assessment: Generalized weakness       Communication   Communication: No difficulties  Cognition Arousal/Alertness: Awake/alert Behavior During Therapy: WFL for tasks assessed/performed Overall Cognitive  Status: Within Functional Limits for tasks assessed                                        General Comments      Exercises Total Joint Exercises Ankle Circles/Pumps: Strengthening;Both;10 reps Quad Sets: Strengthening;Both;10 reps Gluteal Sets: Strengthening;Both;10 reps Heel Slides: Strengthening;Both;10 reps Hip  ABduction/ADduction: Strengthening;Both;10 reps Straight Leg Raises: Strengthening;Both;10 reps Bridges: Strengthening;Both;10 reps Other Exercises Other Exercises: HEP education for BLE APs, GS, QS x 10 each every 1-2 hours daily and BLE hip abd/add, SLR and bridges x 10 each 2x/day   Assessment/Plan    PT Assessment Patient needs continued PT services  PT Problem List Decreased strength;Decreased activity tolerance;Decreased balance       PT Treatment Interventions DME instruction;Gait training;Stair training;Functional mobility training;Therapeutic activities;Therapeutic exercise;Balance training;Patient/family education    PT Goals (Current goals can be found in the Care Plan section)  Acute Rehab PT Goals Patient Stated Goal: To get better and return home PT Goal Formulation: With patient Time For Goal Achievement: 04/18/19 Potential to Achieve Goals: Good    Frequency Min 2X/week   Barriers to discharge        Co-evaluation               AM-PAC PT "6 Clicks" Mobility  Outcome Measure Help needed turning from your back to your side while in a flat bed without using bedrails?: None Help needed moving from lying on your back to sitting on the side of a flat bed without using bedrails?: None Help needed moving to and from a bed to a chair (including a wheelchair)?: A Little Help needed standing up from a chair using your arms (e.g., wheelchair or bedside chair)?: A Little Help needed to walk in hospital room?: A Little Help needed climbing 3-5 steps with a railing? : A Little 6 Click Score: 20    End of Session Equipment Utilized During Treatment: Gait belt Activity Tolerance: Patient tolerated treatment well Patient left: in bed;with call bell/phone within reach;with bed alarm set;Other (comment)(Pt declined up in chair) Nurse Communication: Mobility status PT Visit Diagnosis: Unsteadiness on feet (R26.81);Difficulty in walking, not elsewhere classified  (R26.2);Muscle weakness (generalized) (M62.81)    Time: [id number] PT Time Calculation (min) (ACUTE ONLY): 25 min   Charges:   PT Evaluation $PT Eval Moderate Complexity: 1 Mod PT Treatments $Therapeutic Exercise: 8-22 mins        D. Royetta Asal PT, DPT 04/05/19, 2:29 PM

## 2019-04-05 NOTE — TOC Initial Note (Signed)
Transition of Care [facility]) - Initial/Assessment Note    Patient Details  Name: Lisa Dennis MRN: [id number] Date of Birth: 1965/06/11  Transition of Care [facility]) CM/SW Contact:    Beverly Sessions, RN Phone Number: 04/05/2019, 1:03 PM  Clinical Narrative:                 Patient admitted from home with acute GI bleed  Patient states that she lives at home alone.  Requests that family not be contacted regarding her hospital stay.   PCP - [facility] - denies issues obtaining medications  Patient states at baseline she is independent and drives.  Neighbor will be picking her up date discharge  Patient does elicit alcohol use.  Declines any substance abuse resources. Patient states that she plans to follow up with her hepatologist to see what their recommendations are   PT eval pending    Expected Discharge Plan: Home/Self Care     Patient Goals and CMS Choice        Expected Discharge Plan and Services Expected Discharge Plan: Home/Self Care                                              Prior Living Arrangements/Services   Lives with:: Self Patient language and need for interpreter reviewed:: Yes Do you feel safe going back to the place where you live?: Yes      Need for Family Participation in Patient Care: No (Comment) Care giver support system in place?: No (comment)   Criminal Activity/Legal Involvement Pertinent to Current Situation/Hospitalization: No - Comment as needed  Activities of Daily Living Home Assistive Devices/Equipment: None ADL Screening (condition at time of admission) Patient's cognitive ability adequate to safely complete daily activities?: Yes Is the patient deaf or have difficulty hearing?: No Does the patient have difficulty seeing, even when wearing glasses/contacts?: No Does the patient have difficulty concentrating, remembering, or making decisions?: No Patient able to express need for assistance  with ADLs?: Yes Does the patient have difficulty dressing or bathing?: No Independently performs ADLs?: Yes (appropriate for developmental age) Does the patient have difficulty walking or climbing stairs?: No Weakness of Legs: Both Weakness of Arms/Hands: None  Permission Sought/Granted                  Emotional Assessment       Orientation: : Oriented to Self, Oriented to Place, Oriented to  Time, Oriented to Situation Alcohol / Substance Use: Alcohol Use Psych Involvement: No (comment)  Admission diagnosis:  Acute GI bleeding [K92.2] S/P TIPS (transjugular intrahepatic portosystemic shunt) [id number] Gastrointestinal hemorrhage, unspecified gastrointestinal hemorrhage type [K92.2] Patient Active Problem List   Diagnosis Date Noted  . Acute GI bleeding 04/03/2019  . Alcohol withdrawal syndrome without complication ([place]) [id number]  . Bleeding behind the abdominal cavity   . SBP (spontaneous bacterial peritonitis) ([place])   . Intra-abdominal varices   . Hemorrhage, intraperitoneal 11/15/2018  . Alcoholic cirrhosis of liver with ascites ([place]) 11/13/2018  . Alcoholic intoxication with complication ([place]) [id number]  . Hepatic encephalopathy ([place]) 08/27/2018  . GI bleeding 06/08/2018  . Alcoholic cirrhosis of liver without ascites ([place])   . Hematemesis without nausea    PCP:  Rusty Aus, MD Pharmacy:   [facility], [place]  [address] [place] [address] Phone: [phone] Fax: [phone]     Social Determinants of Health (SDOH) Interventions    Readmission Risk Interventions Readmission Risk Prevention Plan 04/05/2019 08/30/2018  Transportation Screening Complete Complete  PCP or Specialist Appt within 5-7 Days - Not Complete  Not Complete comments - unable to make pcp appt on a Sunday  Home Care Screening - Complete  Medication Review (RN CM) - Complete  Medication  Review (RN Care Manager) Complete -  Palliative Care Screening Not Applicable -

## 2019-04-05 NOTE — Progress Notes (Signed)
Patient refused to finish bowel prep. Had episode of vomiting. Stool still remains solid. Dr. Marius Ditch to be notified.

## 2019-04-05 NOTE — Progress Notes (Signed)
Bowel prep initiated early. PRN nausea medications provided for nausea. No episodes of vomiting. Additional bowel prep ordered. Enema order for 8 AM for colonoscopy in AM.

## 2019-04-05 NOTE — Progress Notes (Signed)
PROGRESS NOTE                                                                                                                                                                                                             Patient Demographics:    Lisa Dennis, is a 54 y.o. female, DOB - [birth year], [id number]  Admit date - 04/03/2019   Admitting Physician Jennye Boroughs, MD  Outpatient Primary MD for the patient is Rusty Aus, MD  LOS - 2   Chief Complaint  Patient presents with  . Rectal Bleeding       Brief Narrative 54 year old female with ongoing alcohol use with liver cirrhosis, history of esophageal varices bleeding s/p banding and TI PS in August 2020 at [facility], hemorrhoids, anxiety presented to the ED with several episodes of bright red bleeding per rectum.  In the ED vitals were stable.  Hemoglobin dropped less than 1 g from her baseline, INR of 1.5 and platelets of 60 3K.  Admitted for further management.   Subjective:   Reported small amount of blood per rectum last night.  Extremely nauseous yesterday and was unable to tolerate prep.  Patient is still very nauseous today   Assessment  & Plan :   Principal problem Acute GI bleed Mild symptoms, no further drop in H&H.  GI suspects this is lower GI bleed due to hemorrhoids.  Ultrasound with Doppler shows patent TI PS.  Octreotide drip discontinued.  Could not tolerate bowel prep yesterday.  Plan for colonoscopy tomorrow if she tolerates the prep.  H&H stable.   Active Problems:  Persistent nausea. Patient having difficulty tolerating the GI prep since yesterday.  Continue as needed Zofran.  Patient is at high risk for further GI bleed with hemodynamic compromise and unsafe to be discharged home without inpatient colonoscopy.    Alcoholic cirrhosis of liver without ascites (HCC) Ongoing heavy alcohol use (reports drinking 4-5 glass of Chardonnay  daily).  High risk of going into DTs.  CIWA of 4.  Continue thiamine folate and multivitamin. Iron panel, folate and B12 normal. Counseled strongly on alcohol cessation.  Continue lactulose and rifaximin.  Leukopenia and thrombocytopenia Secondary to decompensated liver disease.  Monitor  Hypokalemia/hypomagnesemia Replenished  Generalized weakness PT evaluation.   Code Status : Full code  Family Communication  : None  Disposition Plan  : Home possibly tomorrow after colonoscopy and stable.  Barriers For Discharge : Active symptoms (lower GI bleed, anemia, alcohol withdrawal)  Consults  : GI  Procedures  : Ultrasound Doppler of the abdomen  DVT Prophylaxis  : SCDs  Lab Results  Component Value Date   PLT 59 (L) 04/05/2019    Antibiotics  :    Anti-infectives (From admission, onward)   Start     Dose/Rate Route Frequency Ordered Stop   04/04/19 0800  cefTRIAXone (ROCEPHIN) 2 g in sodium chloride 0.9 % 100 mL IVPB     2 g 200 mL/hr over 30 Minutes Intravenous Every 24 hours 04/04/19 0725     04/03/19 2200  rifaximin (XIFAXAN) tablet 550 mg     550 mg Oral 2 times daily 04/03/19 1735          Objective:   Vitals:   04/05/19 0411 04/05/19 0912 04/05/19 1230 04/05/19 1630  BP: 124/71 128/90 137/74 137/77  Pulse: (!) 59 70 66 68  Resp: 20 17 16 16   Temp: 98.8 F (37.1 C) 98.2 F (36.8 C) 98.4 F (36.9 C) 98.4 F (36.9 C)  TempSrc: Oral Oral Oral Oral  SpO2: 98% 99% 100% 100%  Weight:      Height:        Wt Readings from Last 3 Encounters:  04/03/19 52.2 kg  01/30/19 50.3 kg  01/26/19 52.2 kg     Intake/Output Summary (Last 24 hours) at 04/05/2019 1700 Last data filed at 04/05/2019 1300 Gross per 24 hour  Intake 1987.34 ml  Output 600 ml  Net 1387.34 ml   Physical exam Not in distress HEENT: Moist mucosa, supple neck Chest: Clear CVs: Normal S1-S2 GI: Soft, nondistended, nontender Musculoskeletal: Warm, no edema CNS: Alert and oriented, no  tremors      Data Review:    CBC Recent Labs  Lab 04/03/19 1227 04/03/19 1903 04/04/19 0430 04/05/19 0446  WBC 3.0*  --  5.4 5.2  HGB 11.7* 11.4* 10.5* 10.1*  HCT 34.9* 32.8* 30.6* 29.1*  PLT 63*  --  54* 59*  MCV 103.6*  --  103.4* 102.8*  MCH 34.7*  --  35.1* 35.7*  MCHC 33.5  --  34.0 34.7  RDW 14.1  --  14.1 13.8  LYMPHSABS 0.9  --   --   --   MONOABS 0.3  --   --   --   EOSABS 0.1  --   --   --   BASOSABS 0.1  --   --   --     Chemistries  Recent Labs  Lab 04/03/19 1227 04/03/19 2037 04/04/19 0430 04/05/19 0446  NA 142  --  138 136  K 3.9  --  3.4* 3.3*  CL 104  --  103 100  CO2 28  --  21* 26  GLUCOSE 159*  --  133* 101*  BUN 12  --  10 13  CREATININE 0.48  --  0.31* 0.43*  CALCIUM 10.1  --  9.3 8.7*  MG  --  1.4*  --   --   AST 105*  --   --  58*  ALT 52*  --   --  36  ALKPHOS 136*  --   --  81  BILITOT 5.5*  --   --  11.4*   ------------------------------------------------------------------------------------------------------------------ No results for input(s): CHOL, HDL, LDLCALC, TRIG, CHOLHDL, LDLDIRECT in the last 72 hours.  Lab Results  Component Value Date  HGBA1C 3.7 (L) 08/29/2018   ------------------------------------------------------------------------------------------------------------------ No results for input(s): TSH, T4TOTAL, T3FREE, THYROIDAB in the last 72 hours.  Invalid input(s): FREET3 ------------------------------------------------------------------------------------------------------------------ Recent Labs    04/04/19 0430  VITAMINB12 479  FOLATE 15.0  FERRITIN 301  TIBC 227*  IRON 207*    Coagulation profile Recent Labs  Lab 04/03/19 1227  INR 1.5*    No results for input(s): DDIMER in the last 72 hours.  Cardiac Enzymes No results for input(s): CKMB, TROPONINI, MYOGLOBIN in the last 168 hours.  Invalid input(s): CK  ------------------------------------------------------------------------------------------------------------------ No results found for: BNP  Inpatient Medications  Scheduled Meds: . folic acid  1 mg Oral Daily  . LORazepam  0-4 mg Intravenous Q6H   Followed by  . LORazepam  0-4 mg Intravenous Q12H  . multivitamin with minerals  1 tablet Oral Daily  . nadolol  20 mg Oral Daily  . pantoprazole  40 mg Oral Daily  . rifaximin  550 mg Oral BID  . thiamine  100 mg Oral Daily   Continuous Infusions: . sodium chloride Stopped (04/04/19 1811)  . sodium chloride 20 mL/hr at 04/04/19 1854  . cefTRIAXone (ROCEPHIN)  IV 2 g (04/05/19 0919)   PRN Meds:.sodium chloride, acetaminophen **OR** acetaminophen, lactulose, LORazepam **OR** LORazepam, ondansetron **OR** ondansetron (ZOFRAN) IV, polyethylene glycol, promethazine  Micro Results Recent Results (from the past 240 hour(s))  SARS CORONAVIRUS 2 (TAT 6-24 HRS) Nasopharyngeal Nasopharyngeal Swab     Status: None   Collection Time: 04/03/19  4:33 PM   Specimen: Nasopharyngeal Swab  Result Value Ref Range Status   SARS Coronavirus 2 NEGATIVE NEGATIVE Final    Comment: (NOTE) SARS-CoV-2 target nucleic acids are NOT DETECTED. The SARS-CoV-2 RNA is generally detectable in upper and lower respiratory specimens during the acute phase of infection. Negative results do not preclude SARS-CoV-2 infection, do not rule out co-infections with other pathogens, and should not be used as the sole basis for treatment or other patient management decisions. Negative results must be combined with clinical observations, patient history, and epidemiological information. The expected result is Negative. Fact Sheet for Patients: SugarRoll.be Fact Sheet for Healthcare Providers: https://www.woods-mathews.com/ This test is not yet approved or cleared by the [place] FDA and  has been authorized for detection and/or  diagnosis of SARS-CoV-2 by FDA under an Emergency Use Authorization (EUA). This EUA will remain  in effect (meaning this test can be used) for the duration of the COVID-19 declaration under Section 56 4(b)(1) of the Act, 21 U.S.C. section 360bbb-3(b)(1), unless the authorization is terminated or revoked sooner. Performed at [facility], [place] [address] [place], [place] [address]   Surgical PCR screen     Status: None   Collection Time: 04/05/19 12:49 PM   Specimen: Nasal Mucosa; Nasal Swab  Result Value Ref Range Status   MRSA, PCR NEGATIVE NEGATIVE Final   Staphylococcus aureus NEGATIVE NEGATIVE Final    Comment: (NOTE) The Xpert SA Assay (FDA approved for NASAL specimens in patients 53 years of age and older), is one component of a comprehensive surveillance program. It is not intended to diagnose infection nor to guide or monitor treatment. Performed at [facility], [address] [place], [place] [address]     Radiology Reports US LIVER DOPPLER  Result Date: 04/04/2019 CLINICAL DATA:  History of transjugular intrahepatic portosystemic shunt. EXAM: DUPLEX ULTRASOUND OF LIVER AND TIPS SHUNT TECHNIQUE: Color and duplex Doppler ultrasound was performed to evaluate the hepatic in-flow and out-flow vessels. COMPARISON:  CT 11/13/2018 FINDINGS: Portal Vein Velocities Right:  25 cm/sec Left:  10 cm/sec TIPS Stent Velocities Portal end: 71 cm/sec Mid:  100 cm/sec Hepatic end: 118 cm/sec IVC: Patent with normal phasicity. Peak systolic velocity is 45 cm/sec. Present and patent with normal respirator Hepatic Vein Velocities Right:  34 cm/sec Mid:  89 cm/sec Left:  31 cm/sec Splenic Vein: 17 cm/sec Hepatic Artery: 157 Ascities: Absent Varices: Absent Other findings: Main portal vein measures 0.9 cm in diameter. Gallbladder is moderately distended and there may be gallbladder sludge. Increased echogenicity in the liver compared to the right kidney. IMPRESSION: 1. TIPS stent is  patent.  No evidence for focal stenosis. 2. Increased echogenicity in the liver. 3. Negative for ascites. 4. Moderate gallbladder distension.  Question gallbladder sludge. Electronically Signed   By: Markus Daft M.D.   On: 04/04/2019 09:56    Time Spent in minutes 25   Rojelio Uhrich M.D on 04/05/2019 at 5:00 PM  Between 7am to 7pm - Pager - [phone]  After 7pm go to [email or web address] - password [facility]  [facility] -  Office  [phone]

## 2019-04-06 ENCOUNTER — Encounter
Admission: EM | Disposition: A | Discharge status: Home / Self Care | Payer: Self-pay | Source: Home / Self Care | Attending: Emergency Medicine

## 2019-04-06 ENCOUNTER — Inpatient Hospital Stay: Payer: PRIVATE HEALTH INSURANCE | Admitting: Anesthesiology

## 2019-04-06 ENCOUNTER — Inpatient Hospital Stay
Admission: RE | Admit: 2019-04-06 | Payer: PRIVATE HEALTH INSURANCE | Source: Home / Self Care | Admitting: Gastroenterology

## 2019-04-06 DIAGNOSIS — K626 Ulcer of anus and rectum: Secondary | ICD-10-CM

## 2019-04-06 DIAGNOSIS — K625 Hemorrhage of anus and rectum: Secondary | ICD-10-CM | POA: Diagnosis present

## 2019-04-06 DIAGNOSIS — K7030 Alcoholic cirrhosis of liver without ascites: Secondary | ICD-10-CM | POA: Diagnosis not present

## 2019-04-06 DIAGNOSIS — F10230 Alcohol dependence with withdrawal, uncomplicated: Secondary | ICD-10-CM | POA: Diagnosis not present

## 2019-04-06 DIAGNOSIS — Z95828 Presence of other vascular implants and grafts: Secondary | ICD-10-CM

## 2019-04-06 HISTORY — PX: COLONOSCOPY WITH PROPOFOL: SHX5780

## 2019-04-06 LAB — CBC
HCT: 30.7 % — ABNORMAL LOW (ref 36.0–46.0)
Hemoglobin: 10.7 g/dL — ABNORMAL LOW (ref 12.0–15.0)
MCH: 36.1 pg — ABNORMAL HIGH (ref 26.0–34.0)
MCHC: 34.9 g/dL (ref 30.0–36.0)
MCV: 103.7 fL — ABNORMAL HIGH (ref 80.0–100.0)
Platelets: 64 10*3/uL — ABNORMAL LOW (ref 150–400)
RBC: 2.96 MIL/uL — ABNORMAL LOW (ref 3.87–5.11)
RDW: 14.3 % (ref 11.5–15.5)
WBC: 5.2 10*3/uL (ref 4.0–10.5)
nRBC: 0 % (ref 0.0–0.2)

## 2019-04-06 LAB — COMPREHENSIVE METABOLIC PANEL
ALT: 37 U/L (ref 0–44)
AST: 66 U/L — ABNORMAL HIGH (ref 15–41)
Albumin: 2.9 g/dL — ABNORMAL LOW (ref 3.5–5.0)
Alkaline Phosphatase: 75 U/L (ref 38–126)
Anion gap: 9 (ref 5–15)
BUN: 8 mg/dL (ref 6–20)
CO2: 23 mmol/L (ref 22–32)
Calcium: 8.7 mg/dL — ABNORMAL LOW (ref 8.9–10.3)
Chloride: 104 mmol/L (ref 98–111)
Creatinine, Ser: 0.46 mg/dL (ref 0.44–1.00)
GFR calc Af Amer: >60 mL/min (ref >60)
GFR calc non Af Amer: >60 mL/min (ref >60)
Glucose, Bld: 141 mg/dL — ABNORMAL HIGH (ref 70–99)
Potassium: 3.2 mmol/L — ABNORMAL LOW (ref 3.5–5.1)
Sodium: 136 mmol/L (ref 135–145)
Total Bilirubin: 6.7 mg/dL — ABNORMAL HIGH (ref 0.3–1.2)
Total Protein: 6.6 g/dL (ref 6.5–8.1)

## 2019-04-06 SURGERY — COLONOSCOPY WITH PROPOFOL
Anesthesia: General

## 2019-04-06 MED ORDER — PROPOFOL 500 MG/50ML IV EMUL
INTRAVENOUS | Status: DC | PRN
  Administered 2019-04-06: 175 ug/kg/min via INTRAVENOUS

## 2019-04-06 MED ORDER — LIDOCAINE HCL (CARDIAC) PF 100 MG/5ML IV SOSY
PREFILLED_SYRINGE | INTRAVENOUS | Status: DC | PRN
  Administered 2019-04-06: 100 mg via INTRATRACHEAL

## 2019-04-06 MED ORDER — SODIUM CHLORIDE 0.9 % IV SOLN: INTRAVENOUS | Status: DC

## 2019-04-06 MED ORDER — PROPOFOL 10 MG/ML IV BOLUS
INTRAVENOUS | Status: DC | PRN
  Administered 2019-04-06: 50 mg via INTRAVENOUS
  Administered 2019-04-06: 30 mg via INTRAVENOUS
  Administered 2019-04-06: 20 mg via INTRAVENOUS

## 2019-04-06 NOTE — Transfer of Care (Signed)
Immediate Anesthesia Transfer of Care Note  Patient: Vennela Forte  Procedure(s) Performed: COLONOSCOPY WITH PROPOFOL (N/A )  Patient Location: Endoscopy Unit  Anesthesia Type:General  Level of Consciousness: awake, alert , oriented and patient cooperative  Airway & Oxygen Therapy: Patient Spontanous Breathing and Patient connected to face mask oxygen  Post-op Assessment: Report given to RN and Post -op Vital signs reviewed and stable  Post vital signs: Reviewed and stable  Last Vitals:  Vitals Value Taken Time  BP    Temp    Pulse 80 04/06/19 1158  Resp 13 04/06/19 1158  SpO2 100 % 04/06/19 1158  Vitals shown include unvalidated device data.  Last Pain:  Vitals:   04/06/19 1040  TempSrc: Skin  PainSc: 0-No pain         Complications: No apparent anesthesia complications

## 2019-04-06 NOTE — Anesthesia Preprocedure Evaluation (Addendum)
Anesthesia Evaluation  Patient identified by MRN, date of birth, ID band Patient awake    Reviewed: Allergy & Precautions, H&P , NPO status , Patient's Chart, lab work & pertinent test results  History of Anesthesia Complications (+) PONV and history of anesthetic complications  Airway Mallampati: II  TM Distance: >3 FB Neck ROM: full    Dental  (+) Chipped, Poor Dentition   Pulmonary neg shortness of breath, former smoker,           Cardiovascular Exercise Tolerance: Good (-) angina(-) Past MI and (-) DOE negative cardio ROS       Neuro/Psych PSYCHIATRIC DISORDERS negative neurological ROS     GI/Hepatic negative GI ROS, neg GERD  ,(+) Cirrhosis     substance abuse  alcohol use,   Endo/Other  negative endocrine ROS  Renal/GU CRF  negative genitourinary   Musculoskeletal   Abdominal   Peds  Hematology negative hematology ROS (+)   Anesthesia Other Findings Patient is NPO appropriate and reports no nausea or vomiting today.  Past Medical History: No date: Alcohol abuse No date: Anxiety No date: Cirrhosis [facility])  Past Surgical History: 06/08/2018: ESOPHAGOGASTRODUODENOSCOPY (EGD) WITH PROPOFOL; N/A     Comment:  Procedure: ESOPHAGOGASTRODUODENOSCOPY (EGD) WITH               PROPOFOL;  Surgeon: Lucilla Lame, MD;  Location: [facility]               ENDOSCOPY;  Service: Endoscopy;  Laterality: N/A; 11/12/2018: ESOPHAGOGASTRODUODENOSCOPY (EGD) WITH PROPOFOL; N/A     Comment:  Procedure: ESOPHAGOGASTRODUODENOSCOPY (EGD) WITH               PROPOFOL;  Surgeon: Jonathon Bellows, MD;  Location[facility]               ENDOSCOPY;  Service: Gastroenterology;  Laterality: N/A;  BMI    Body Mass Index: 21.73 kg/m      Reproductive/Obstetrics negative OB ROS                            Anesthesia Physical Anesthesia Plan  ASA: III  Anesthesia Plan: General   Post-op Pain Management:    Induction:  Intravenous  PONV Risk Score and Plan: Propofol infusion and TIVA  Airway Management Planned: Natural Airway and Nasal Cannula  Additional Equipment:   Intra-op Plan:   Post-operative Plan:   Informed Consent: I have reviewed the patients History and Physical, chart, labs and discussed the procedure including the risks, benefits and alternatives for the proposed anesthesia with the patient or authorized representative who has indicated his/her understanding and acceptance.     Dental Advisory Given  Plan Discussed with: Anesthesiologist, CRNA and Surgeon  Anesthesia Plan Comments: (Patient consented for risks of anesthesia including but not limited to:  - adverse reactions to medications - risk of intubation if required - damage to teeth, lips or other oral mucosa - sore throat or hoarseness - Damage to heart, brain, lungs or loss of life  Patient voiced understanding.)        Anesthesia Quick Evaluation

## 2019-04-06 NOTE — Anesthesia Postprocedure Evaluation (Signed)
Anesthesia Post Note  Patient: Lisa Dennis  Procedure(s) Performed: COLONOSCOPY WITH PROPOFOL (N/A )  Patient location during evaluation: Endoscopy Anesthesia Type: General Level of consciousness: awake and alert and oriented Pain management: pain level controlled Vital Signs Assessment: post-procedure vital signs reviewed and stable Respiratory status: spontaneous breathing, nonlabored ventilation and respiratory function stable Cardiovascular status: blood pressure returned to baseline and stable Postop Assessment: no signs of nausea or vomiting Anesthetic complications: no     Last Vitals:  Vitals:   04/06/19 1208 04/06/19 1218  BP: 126/90 (!) 157/87  Pulse: 80 78  Resp: 20 (!) 7  Temp:    SpO2: 100% 100%    Last Pain:  Vitals:   04/06/19 1218  TempSrc:   PainSc: 0-No pain                 [name]

## 2019-04-06 NOTE — Discharge Summary (Signed)
Physician Discharge Summary  Dannika Cragun [id number] DOB: Nov 25, 1965 DOA: 04/03/2019  PCP: Rusty Aus, MD  Admit date: 04/03/2019 Discharge date: 04/06/2019  Admitted From: Home Disposition: Home  Recommendations for Outpatient Follow-up:  1. 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  In the ED vitals were stable.  Hemoglobin dropped less than 1 g from her baseline, INR of 1.5 and platelets of 63K.  Admitted for further management.  Hospital course  Principal problem  Acute lower GI bleed Mild symptoms, no further drop in H&H.    Ultrasound with Doppler shows patent TIPSS.    Patient had colonoscopy done showing a single rectal ulcer which is likely the source of bleeding.  No active bleeding seen. Recommended high-fiber diet and continue stool softeners to avoid constipation.  Also instructed to avoid NSAIDs and alcohol use. Patient stable to be discharged home with outpatient follow-up.   Active Problems:  Persistent nausea. Resolved.    Alcoholic cirrhosis of liver without ascites (HCC) Ongoing heavy alcohol use (reports drinking 4-5 glass of Chardonnay daily).    Monitored on CIWA.  No further withdrawal symptoms.  Counseled strongly on alcohol cessation.  Iron panel, folate and B12 normal.   Continue lactulose and  rifaximin.  Leukopenia and thrombocytopenia Secondary to decompensated liver disease.  Stable.  Hypokalemia/hypomagnesemia Replenished  Generalized weakness PT recommends home health.  Will arrange    Family Communication  : None  Disposition Plan  : Home  withdrawal)  Consults  : GI  Procedures  : Ultrasound Doppler of the abdomen, colonoscopy   Discharge Instructions   Allergies as of 04/06/2019      Reactions   Benadryl [diphenhydramine] Other (See Comments)   Altered Mental Status   Venlafaxine Hcl Er Itching   Penicillins Hives, Other (See Comments)   Did it involve swelling of the face/tongue/throat, SOB, or low BP? No Did it involve sudden or severe rash/hives, skin peeling, or any reaction on the inside of your mouth or nose? Unknown Did you need to seek medical attention at a hospital or doctor's office? Unknown When did it last happen?unknown If all above answers are "NO", may proceed with cephalosporin use.  **patient tolerates ceftriaxone - Aug 2020   Clindamycin/lincomycin Swelling   Ciprofloxacin Itching   Latex Hives   Morphine And Related Hives   Sulfa Antibiotics Hives   Tetracyclines & Related Hives      Medication List    STOP taking these medications   cefTRIAXone 1 g in sodium chloride 0.9 % 100 mL   lactulose 10 GM/15ML solution Commonly known as: CHRONULAC   mupirocin ointment 2 % Commonly known as: BACTROBAN   octreotide 500 mcg in sodium chloride 0.9 % 250 mL     TAKE these medications   ciprofloxacin 500 MG tablet Commonly known as: CIPRO Take 500 mg by mouth daily.   folic acid 1 MG tablet  Commonly known as: FOLVITE Take 1 tablet (1 mg total) by mouth daily.   nadolol 20 MG tablet Commonly known as: Corgard Take 1 tablet (20 mg total) by mouth daily.   pantoprazole 40 MG tablet Commonly known as: Protonix Take 1 tablet (40 mg total) by mouth daily.   polyethylene glycol powder 17 GM/SCOOP  powder Commonly known as: GLYCOLAX/MIRALAX Take 17 g by mouth daily.   rifaximin 550 MG Tabs tablet Commonly known as: XIFAXAN Take 1 tablet (550 mg total) by mouth 2 (two) times daily.   thiamine 100 MG tablet Take 1 tablet (100 mg total) by mouth daily.   venlafaxine XR 37.5 MG 24 hr capsule Commonly known as: EFFEXOR-XR Take 37.5 mg by mouth daily.      Follow-up Information    Rusty Aus, MD. Schedule an appointment as soon as possible for a visit.   Specialty: Internal Medicine Why: 3-5 days from discharge Contact information: [place] [id number] [phone]          Allergies  Allergen Reactions  . Benadryl [Diphenhydramine] Other (See Comments)    Altered Mental Status  . Venlafaxine Hcl Er Itching  . Penicillins Hives and Other (See Comments)    Did it involve swelling of the face/tongue/throat, SOB, or low BP? No Did it involve sudden or severe rash/hives, skin peeling, or any reaction on the inside of your mouth or nose? Unknown Did you need to seek medical attention at a hospital or doctor's office? Unknown When did it last happen?unknown If all above answers are "NO", may proceed with cephalosporin use.  **patient tolerates ceftriaxone - Aug 2020   . Clindamycin/Lincomycin Swelling  . Ciprofloxacin Itching  . Latex Hives  . Morphine And Related Hives  . Sulfa Antibiotics Hives  . Tetracyclines & Related Hives        Procedures/Studies: US LIVER DOPPLER  Result Date: 04/04/2019 CLINICAL DATA:  History of transjugular intrahepatic portosystemic shunt. EXAM: DUPLEX ULTRASOUND OF LIVER AND TIPS SHUNT TECHNIQUE: Color and duplex Doppler ultrasound was performed to evaluate the hepatic in-flow and out-flow vessels. COMPARISON:  CT 11/13/2018 FINDINGS: Portal Vein Velocities Right:  25 cm/sec Left:  10 cm/sec TIPS Stent Velocities Portal end: 71 cm/sec Mid:  100 cm/sec Hepatic end: 118  cm/sec IVC: Patent with normal phasicity. Peak systolic velocity is 45 cm/sec. Present and patent with normal respirator Hepatic Vein Velocities Right:  34 cm/sec Mid:  89 cm/sec Left:  31 cm/sec Splenic Vein: 17 cm/sec Hepatic Artery: 157 Ascities: Absent Varices: Absent Other findings: Main portal vein measures 0.9 cm in diameter. Gallbladder is moderately distended and there may be gallbladder sludge. Increased echogenicity in the liver compared to the right kidney. IMPRESSION: 1. TIPS stent is patent.  No evidence for focal stenosis. 2. Increased echogenicity in the liver. 3. Negative for ascites. 4. Moderate gallbladder distension.  Question gallbladder sludge. Electronically Signed   By: Markus Daft M.D.   On: 04/04/2019 09:56       Subjective: No further rectal bleed.  Discharge Exam: Vitals:   04/06/19 1208 04/06/19 1218  BP: 126/90 (!) 157/87  Pulse: 80 78  Resp: 20 (!) 7  Temp:    SpO2: 100% 100%   Vitals:   04/06/19 1040 04/06/19 1158 04/06/19 1208 04/06/19 1218  BP: (!) 142/81 128/72 126/90 (!) 157/87  Pulse: 77 80 80 78  Resp: 18 13 20  (!) 7  Temp: 99.1 F (37.3 C)  TempSrc: Skin     SpO2: 100% 100% 100% 100%  Weight: 50.8 kg     Height: 5\' 2"  (1.575 m)       General: Not in distress HEENT: Pallor present, moist mucosa, supple neck Chest: Clear CVs: Normal S1-S2 GI: Soft, nondistended, nontender Musculoskeletal: Warm, no edema CNS: Alert and oriented, no tremors    The results of significant diagnostics from this hospitalization (including imaging, microbiology, ancillary and laboratory) are listed below for reference.     Microbiology: Recent Results (from the past 240 hour(s))  SARS CORONAVIRUS 2 (TAT 6-24 HRS) Nasopharyngeal Nasopharyngeal Swab     Status: None   Collection Time: 04/03/19  4:33 PM   Specimen: Nasopharyngeal Swab  Result Value Ref Range Status   SARS Coronavirus 2 NEGATIVE NEGATIVE Final    Comment: (NOTE) SARS-CoV-2 target nucleic  acids are NOT DETECTED. The SARS-CoV-2 RNA is generally detectable in upper and lower respiratory specimens during the acute phase of infection. Negative results do not preclude SARS-CoV-2 infection, do not rule out co-infections with other pathogens, and should not be used as the sole basis for treatment or other patient management decisions. Negative results must be combined with clinical observations, patient history, and epidemiological information. The expected result is Negative. Fact Sheet for Patients: SugarRoll.be Fact Sheet for Healthcare Providers: https://www.woods-mathews.com/ This test is not yet approved or cleared by the [place] FDA and  has been authorized for detection and/or diagnosis of SARS-CoV-2 by FDA under an Emergency Use Authorization (EUA). This EUA will remain  in effect (meaning this test can be used) for the duration of the COVID-19 declaration under Section 56 4(b)(1) of the Act, 21 U.S.C. section 360bbb-3(b)(1), unless the authorization is terminated or revoked sooner. Performed at [facility], [place] [address], [place], [place] [address]   Surgical PCR screen     Status: None   Collection Time: 04/05/19 12:49 PM   Specimen: Nasal Mucosa; Nasal Swab  Result Value Ref Range Status   MRSA, PCR NEGATIVE NEGATIVE Final   Staphylococcus aureus NEGATIVE NEGATIVE Final    Comment: (NOTE) The Xpert SA Assay (FDA approved for NASAL specimens in patients 55 years of age and older), is one component of a comprehensive surveillance program. It is not intended to diagnose infection nor to guide or monitor treatment. Performed at [facility], [place] [place],  [address]      Labs: BNP (last 3 results) No results for input(s): BNP in the last 8760 hours. Basic Metabolic Panel: Recent Labs  Lab 04/03/19 1227 04/03/19 2037 04/04/19 0430 04/05/19 0446  NA 142  --  138 136  K  3.9  --  3.4* 3.3*  CL 104  --  103 100  CO2 28  --  21* 26  GLUCOSE 159*  --  133* 101*  BUN 12  --  10 13  CREATININE 0.48  --  0.31* 0.43*  CALCIUM 10.1  --  9.3 8.7*  MG  --  1.4*  --   --   PHOS  --  2.7  --   --    Liver Function Tests: Recent Labs  Lab 04/03/19 1227 04/05/19 0446  AST 105* 58*  ALT 52* 36  ALKPHOS 136* 81  BILITOT 5.5* 11.4*  PROT 8.2* 6.8  ALBUMIN 3.8 2.9*   No results for input(s): LIPASE, AMYLASE in the last 168 hours. No results for input(s): AMMONIA in the last 168 hours. CBC: Recent Labs  Lab 04/03/19 1227 04/03/19 1903 04/04/19 0430 04/05/19 0446 04/06/19 0841  WBC 3.0*  --  5.4 5.2 5.2  NEUTROABS 1.7  --   --   --   --   HGB 11.7* 11.4* 10.5* 10.1* 10.7*  HCT 34.9* 32.8* 30.6* 29.1* 30.7*  MCV 103.6*  --  103.4* 102.8* 103.7*  PLT 63*  --  54* 59* 64*   Cardiac Enzymes: No results for input(s): CKTOTAL, CKMB, CKMBINDEX, TROPONINI in the last 168 hours. BNP: Invalid input(s): POCBNP CBG: No results for input(s): GLUCAP in the last 168 hours. D-Dimer No results for input(s): DDIMER in the last 72 hours. Hgb A1c No results for input(s): HGBA1C in the last 72 hours. Lipid Profile No results for input(s): CHOL, HDL, LDLCALC, TRIG, CHOLHDL, LDLDIRECT in the last 72 hours. Thyroid function studies No results for input(s): TSH, T4TOTAL, T3FREE, THYROIDAB in the last 72 hours.  Invalid input(s): FREET3 Anemia work up Recent Labs    04/04/19 0430  VITAMINB12 479  FOLATE 15.0  FERRITIN 301  TIBC 227*  IRON 207*   Urinalysis    Component Value Date/Time   COLORURINE AMBER (A) 01/26/2019 1547   APPEARANCEUR TURBID (A) 01/26/2019 1547   LABSPEC 1.014 01/26/2019 1547   PHURINE 8.0 01/26/2019 1547   GLUCOSEU NEGATIVE 01/26/2019 1547   HGBUR NEGATIVE 01/26/2019 1547   BILIRUBINUR NEGATIVE 01/26/2019 1547   KETONESUR NEGATIVE 01/26/2019 1547   PROTEINUR 100 (A) 01/26/2019 1547   NITRITE NEGATIVE 01/26/2019 1547   LEUKOCYTESUR  NEGATIVE 01/26/2019 1547   Sepsis Labs Invalid input(s): PROCALCITONIN,  WBC,  LACTICIDVEN Microbiology Recent Results (from the past 240 hour(s))  SARS CORONAVIRUS 2 (TAT 6-24 HRS) Nasopharyngeal Nasopharyngeal Swab     Status: None   Collection Time: 04/03/19  4:33 PM   Specimen: Nasopharyngeal Swab  Result Value Ref Range Status   SARS Coronavirus 2 NEGATIVE NEGATIVE Final    Comment: (NOTE) SARS-CoV-2 target nucleic acids are NOT DETECTED. The SARS-CoV-2 RNA is generally detectable in upper and lower respiratory specimens during the acute phase of infection. Negative results do not preclude SARS-CoV-2 infection, do not rule out co-infections with other pathogens, and should not be used as the sole basis for treatment or other patient management decisions. Negative results must be combined with clinical observations, patient history, and epidemiological information. The expected result is Negative. Fact Sheet for Patients: SugarRoll.be Fact Sheet for Healthcare Providers: https://www.woods-mathews.com/ This test is not yet approved or cleared by the [place] FDA and  has been authorized for detection and/or diagnosis of SARS-CoV-2 by FDA under an Emergency Use Authorization (EUA). This EUA will remain  in effect (meaning this test can be used) for the duration of the COVID-19 declaration under Section 56 4(b)(1) of the Act, 21 U.S.C. section 360bbb-3(b)(1), unless the authorization is terminated or revoked sooner. Performed at [facility], [place] [address] [place], [place] [address]   Surgical PCR screen     Status: None   Collection Time: 04/05/19 12:49 PM   Specimen: Nasal Mucosa; Nasal Swab  Result Value Ref Range Status   MRSA, PCR NEGATIVE NEGATIVE Final   Staphylococcus aureus NEGATIVE NEGATIVE Final    Comment: (NOTE) The Xpert SA Assay (FDA approved for NASAL specimens in patients 21 years of age and older), is  one component of a comprehensive surveillance program. It is not intended to diagnose infection nor to guide or monitor treatment. Performed at [facility], [address] [place],  [address]  Time coordinating discharge: 35  SIGNED:   Louellen Molder, MD  [facility] 04/06/2019, 1:22 PM Pager   If 7PM-7AM, please contact night-coverage [email or web address] Password [email or web address]

## 2019-04-06 NOTE — OR Nursing (Signed)
Pt insisting she wants to go home.  Plans on having nurse prepare the papers for discharge.

## 2019-04-06 NOTE — TOC Transition Note (Signed)
Transition of Care [facility]) - CM/SW Discharge Note   Patient Details  Name: Santoria Doran MRN: [id number] Date of Birth: [birth year]  [facility]) CM/SW Contact:  Beverly Sessions, RN Phone Number: 04/06/2019, 2:20 PM   Clinical Narrative:     Patient to discharge home today Declines home health services for discharge  PCP follow up appointment made   Final next level of care: Home/Self Care Barriers to Discharge: No Barriers Identified   Patient Goals and CMS Choice        Discharge Placement                       Discharge Plan and Services                          HH Arranged: Patient Refused [facility]          Social Determinants of Health (SDOH) Interventions     Readmission Risk Interventions Readmission Risk Prevention Plan 04/06/2019 04/05/2019 08/30/2018  Transportation Screening Complete Complete Complete  PCP or Specialist Appt within 5-7 Days - - Not Complete  Not Complete comments - - unable to make pcp appt on a Sunday  Home Care Screening - - Complete  Medication Review (RN CM) - - Complete  Medication Review ([place]) Complete Complete -  PCP or Specialist appointment within 3-5 days of discharge Complete - -  [place] or Home Care Consult Patient refused - -  Palliative Care Screening Not Applicable Not Applicable -  [place] Not Applicable - -

## 2019-04-06 NOTE — Progress Notes (Signed)
Tap water enema given. BM was liquid, with some red in it. No solid pieces.

## 2019-04-06 NOTE — TOC Progression Note (Signed)
Transition of Care [facility]) - Progression Note    Patient Details  Name: Lisa Dennis MRN: [id number] Date of Birth: July 12, 1965  Transition of Care [facility]) CM/SW Contact  Beverly Sessions, RN Phone Number: 04/06/2019, 10:59 AM  Clinical Narrative:     Off floor for procedure.  Unable to discuss recommendation for home health at this time   Expected Discharge Plan: Home/Self Care    Expected Discharge Plan and Services Expected Discharge Plan: Home/Self Care                                               Social Determinants of Health (SDOH) Interventions    Readmission Risk Interventions Readmission Risk Prevention Plan 04/05/2019 08/30/2018  Transportation Screening Complete Complete  PCP or Specialist Appt within 5-7 Days - Not Complete  Not Complete comments - unable to make pcp appt on a Sunday  Home Care Screening - Complete  Medication Review (RN CM) - Complete  Medication Review (RN Care Manager) Complete -  Palliative Care Screening Not Applicable -

## 2019-04-06 NOTE — Discharge Instructions (Signed)

## 2019-04-07 ENCOUNTER — Encounter: Payer: Self-pay | Admitting: *Deleted

## 2019-04-07 NOTE — Op Note (Signed)
[facility] Patient Name: Lisa Dennis Procedure Date: 04/06/2019 11:04 AM MRN: [id number] Account #: [id number] Date of Birth: December 26, 1965 Admit Type: Inpatient Age: 54 Room: [facility] ENDO ROOM 3 Gender: Female Note Status: Finalized Procedure:             Colonoscopy Indications:           Rectal bleeding Providers:             Lin Landsman MD, MD Medicines:             Monitored Anesthesia Care Complications:         No immediate complications. Estimated blood loss: None. Procedure:             Pre-Anesthesia Assessment:                        - Prior to the procedure, a History and Physical was                         performed, and patient medications and allergies were                         reviewed. The patient is competent. The risks and                         benefits of the procedure and the sedation options and                         risks were discussed with the patient. All questions                         were answered and informed consent was obtained.                         Patient identification and proposed procedure were                         verified by the physician, the nurse, the                         anesthesiologist, the anesthetist and the technician                         in the pre-procedure area in the procedure room in the                         endoscopy suite. Mental Status Examination: alert and                         oriented. Airway Examination: normal oropharyngeal                         airway and neck mobility. Respiratory Examination:                         clear to auscultation. CV Examination: normal.                         Prophylactic Antibiotics: The patient does not require  prophylactic antibiotics. Prior Anticoagulants: The                         patient has taken no previous anticoagulant or                         antiplatelet agents. ASA Grade  Assessment: III - A                         patient with severe systemic disease. After reviewing                         the risks and benefits, the patient was deemed in                         satisfactory condition to undergo the procedure. The                         anesthesia plan was to use monitored anesthesia care                         (MAC). Immediately prior to administration of                         medications, the patient was re-assessed for adequacy                         to receive sedatives. The heart rate, respiratory                         rate, oxygen saturations, blood pressure, adequacy of                         pulmonary ventilation, and response to care were                         monitored throughout the procedure. The physical                         status of the patient was re-assessed after the                         procedure.                        After obtaining informed consent, the colonoscope was                         passed under direct vision. Throughout the procedure,                         the patient's blood pressure, pulse, and oxygen                         saturations were monitored continuously. The                         Colonoscope was introduced through the anus and  advanced to the the terminal ileum, with                         identification of the appendiceal orifice and IC                         valve. The colonoscopy was unusually difficult due to                         significant looping. Successful completion of the                         procedure was aided by applying abdominal pressure.                         The patient tolerated the procedure well. The quality                         of the bowel preparation was fair. Findings:      Skin tags were found on perianal exam.      The terminal ileum appeared normal.      A single (solitary) five mm ulcer was found in the distal rectum. No        bleeding was present. Stigmata of recent bleeding were present. Likely       stercoral ulcer in setting of constipation      A patchy area of scattered mildly erythematous mucosal areas were found       in the distal rectum. Oozing from scope trauma      There was no evidenc rectal varices or large hemorrhoids Impression:            - Preparation of the colon was fair.                        - Perianal skin tags found on perianal exam.                        - The examined portion of the ileum was normal.                        - A single (solitary) ulcer in the distal rectum,                         source of bleeding.                        - Erythematous mucosa in the distal rectum, source of                         bleeding                        - No specimens collected. Recommendation:        - Return patient to hospital ward for ongoing care.                        - Low sodium diet today.                        -  Continue present medications.                        - High fiber diet today.                        - Avoid constipation Procedure Code(s):     --- Professional ---                        [phone], Colonoscopy, flexible; diagnostic, including                         collection of specimen(s) by brushing or washing, when                         performed (separate procedure) Diagnosis Code(s):     --- Professional ---                        K62.6, Ulcer of anus and rectum                        K62.89, Other specified diseases of anus and rectum                        K64.4, Residual hemorrhoidal skin tags                        K62.5, Hemorrhage of anus and rectum CPT copyright 2019 American Medical Association. All rights reserved. The codes documented in this report are preliminary and upon coder review may  be revised to meet current compliance requirements. Dr. Ulyess Mort Lin Landsman MD, MD 04/06/2019 11:58:23 AM This report has been signed  electronically. Number of Addenda: 0 Note Initiated On: 04/06/2019 11:04 AM Scope Withdrawal Time: 0 hours 9 minutes 24 seconds  Total Procedure Duration: 0 hours 25 minutes 59 seconds  Estimated Blood Loss:  Estimated blood loss: none. Estimated blood loss: none.      [facility]

## 2019-05-18 ENCOUNTER — Emergency Department
Admission: EM | Admit: 2019-05-18 | Discharge: 2019-05-18 | Disposition: A | Discharge status: Home / Self Care | Payer: PRIVATE HEALTH INSURANCE | Attending: Emergency Medicine | Admitting: Emergency Medicine

## 2019-05-18 ENCOUNTER — Other Ambulatory Visit: Payer: Self-pay

## 2019-05-18 ENCOUNTER — Encounter: Payer: Self-pay | Admitting: Emergency Medicine

## 2019-05-18 DIAGNOSIS — Z87891 Personal history of nicotine dependence: Secondary | ICD-10-CM | POA: Insufficient documentation

## 2019-05-18 DIAGNOSIS — Z79899 Other long term (current) drug therapy: Secondary | ICD-10-CM | POA: Insufficient documentation

## 2019-05-18 DIAGNOSIS — E7220 Disorder of urea cycle metabolism, unspecified: Secondary | ICD-10-CM | POA: Insufficient documentation

## 2019-05-18 DIAGNOSIS — Z91040 Latex allergy status: Secondary | ICD-10-CM | POA: Insufficient documentation

## 2019-05-18 DIAGNOSIS — R531 Weakness: Secondary | ICD-10-CM | POA: Diagnosis present

## 2019-05-18 LAB — CBC WITH DIFFERENTIAL/PLATELET
Abs Immature Granulocytes: 0.01 10*3/uL (ref 0.00–0.07)
Basophils Absolute: 0.1 10*3/uL (ref 0.0–0.1)
Basophils Relative: 2 %
Eosinophils Absolute: 0.1 10*3/uL (ref 0.0–0.5)
Eosinophils Relative: 3 %
HCT: 34.3 % — ABNORMAL LOW (ref 36.0–46.0)
Hemoglobin: 11.6 g/dL — ABNORMAL LOW (ref 12.0–15.0)
Immature Granulocytes: 0 %
Lymphocytes Relative: 39 %
Lymphs Abs: 1.5 10*3/uL (ref 0.7–4.0)
MCH: 35.4 pg — ABNORMAL HIGH (ref 26.0–34.0)
MCHC: 33.8 g/dL (ref 30.0–36.0)
MCV: 104.6 fL — ABNORMAL HIGH (ref 80.0–100.0)
Monocytes Absolute: 0.5 10*3/uL (ref 0.1–1.0)
Monocytes Relative: 13 %
Neutro Abs: 1.7 10*3/uL (ref 1.7–7.7)
Neutrophils Relative %: 43 %
Platelets: 56 10*3/uL — ABNORMAL LOW (ref 150–400)
RBC: 3.28 MIL/uL — ABNORMAL LOW (ref 3.87–5.11)
RDW: 13.8 % (ref 11.5–15.5)
WBC: 3.9 10*3/uL — ABNORMAL LOW (ref 4.0–10.5)
nRBC: 0 % (ref 0.0–0.2)

## 2019-05-18 LAB — TROPONIN I (HIGH SENSITIVITY): Troponin I (High Sensitivity): 9 ng/L (ref <18)

## 2019-05-18 LAB — COMPREHENSIVE METABOLIC PANEL
ALT: 44 U/L (ref 0–44)
AST: 91 U/L — ABNORMAL HIGH (ref 15–41)
Albumin: 3.5 g/dL (ref 3.5–5.0)
Alkaline Phosphatase: 116 U/L (ref 38–126)
Anion gap: 8 (ref 5–15)
BUN: 7 mg/dL (ref 6–20)
CO2: 30 mmol/L (ref 22–32)
Calcium: 9.8 mg/dL (ref 8.9–10.3)
Chloride: 101 mmol/L (ref 98–111)
Creatinine, Ser: 0.45 mg/dL (ref 0.44–1.00)
GFR calc Af Amer: >60 mL/min (ref >60)
GFR calc non Af Amer: >60 mL/min (ref >60)
Glucose, Bld: 129 mg/dL — ABNORMAL HIGH (ref 70–99)
Potassium: 3.3 mmol/L — ABNORMAL LOW (ref 3.5–5.1)
Sodium: 139 mmol/L (ref 135–145)
Total Bilirubin: 6.2 mg/dL — ABNORMAL HIGH (ref 0.3–1.2)
Total Protein: 7.7 g/dL (ref 6.5–8.1)

## 2019-05-18 LAB — ETHANOL: Alcohol, Ethyl (B): 99 mg/dL — ABNORMAL HIGH (ref <10)

## 2019-05-18 LAB — AMMONIA: Ammonia: 65 umol/L — ABNORMAL HIGH (ref 9–35)

## 2019-05-18 MED ORDER — LACTULOSE 10 GM/15ML PO SOLN
30.0000 g | Freq: Once | ORAL | Status: AC
  Administered 2019-05-18: 30 g via ORAL
  Filled 2019-05-18: qty 60

## 2019-05-18 MED ORDER — LORAZEPAM 2 MG/ML IJ SOLN
1.0000 mg | Freq: Once | INTRAMUSCULAR | Status: AC
  Administered 2019-05-18: 05:00:00 1 mg via INTRAVENOUS
  Filled 2019-05-18: qty 1

## 2019-05-18 NOTE — ED Triage Notes (Signed)
Patient ambulatory to triage with steady gait, without difficulty or distress noted, mask in place; pt reports last several days having weakness, "sleeping a lot"

## 2019-05-18 NOTE — ED Provider Notes (Signed)
[facility] Emergency Department Provider Note  ____________________________________________   First MD Initiated Contact with Patient 05/18/19 0501     (approximate)  I have reviewed the triage vital signs and the nursing notes.   HISTORY  Chief Complaint Weakness     HPI Lisa Dennis is a 54 y.o. 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  Patient does admit to not taking her lactulose.  Patient also admits to constipation.  Patient does admit to continued EtOH ingestion daily stating last EtOH ingestion was lunchtime yesterday.  Patient is requesting outpatient detox referral.        Past Medical History:  Diagnosis Date  . Alcohol abuse   . Anxiety   . Cirrhosis [facility])     Patient Active Problem List   Diagnosis Date Noted  . 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 Rectal bleeding   . Acute GI bleeding 04/03/2019  . Alcohol withdrawal syndrome without complication ([place]) 123XX123  . Bleeding behind the abdominal cavity   . SBP (spontaneous bacterial peritonitis) ([place])   . Intra-abdominal varices   . Hemorrhage, intraperitoneal 11/15/2018  . Alcoholic cirrhosis of liver with ascites ([place]) 11/13/2018  . Alcoholic intoxication with complication ([place]) [id number]  . Hepatic encephalopathy ([place]) 08/27/2018  . GI bleeding 06/08/2018  . Alcoholic cirrhosis of liver without ascites ([place])   . Hematemesis without nausea     Past Surgical History:  Procedure Laterality Date  . COLONOSCOPY WITH PROPOFOL N/A 04/06/2019   Procedure: COLONOSCOPY WITH PROPOFOL;  Surgeon: Lin Landsman, MD;  Location: [facility] ENDOSCOPY;  Service: Gastroenterology;  Laterality: N/A;  . ESOPHAGOGASTRODUODENOSCOPY (EGD) WITH PROPOFOL N/A  06/08/2018   Procedure: ESOPHAGOGASTRODUODENOSCOPY (EGD) WITH PROPOFOL;  Surgeon: Lucilla Lame, MD;  Location: [facility] ENDOSCOPY;  Service: Endoscopy;  Laterality: N/A;  . ESOPHAGOGASTRODUODENOSCOPY (EGD) WITH PROPOFOL N/A 11/12/2018   Procedure: ESOPHAGOGASTRODUODENOSCOPY (EGD) WITH PROPOFOL;  Surgeon: Jonathon Bellows, MD;  Location: [facility] ENDOSCOPY;  Service: Gastroenterology;  Laterality: N/A;    Prior to Admission medications   Medication Sig Start Date End Date Taking? Authorizing Provider  ciprofloxacin (CIPRO) 500 MG tablet Take 500 mg by mouth daily. 11/27/18   [provider]  folic acid (FOLVITE) 1 MG tablet Take 1 tablet (1 mg total) by mouth daily. 01/30/19   Carrie Mew, MD  nadolol (CORGARD) 20 MG tablet Take 1 tablet (20 mg total) by mouth daily. 01/30/19 04/30/19  Carrie Mew, MD  pantoprazole (PROTONIX) 40 MG tablet Take 1 tablet (40 mg total) by mouth daily. 01/30/19 04/30/19  Carrie Mew, MD  polyethylene glycol powder [facility]) 17 GM/SCOOP powder Take 17 g by mouth daily.    [provider]  thiamine 100 MG tablet Take 1 tablet (100 mg total) by mouth daily. 01/30/19   Carrie Mew, MD  venlafaxine XR (EFFEXOR-XR) 37.5 MG 24 hr capsule Take 37.5 mg by mouth daily. 12/11/18   [provider]    Allergies Benadryl [diphenhydramine], Venlafaxine hcl er, Penicillins, Clindamycin/lincomycin, Ciprofloxacin, Latex, Morphine and related, Sulfa antibiotics, and Tetracyclines & related  No family history on file.  Social History Social History   Tobacco Use  . Smoking status: Former Research scientist (life sciences)  . Smokeless tobacco: Never Used  Substance Use Topics  . Alcohol use: Yes    Alcohol/week: 10.0 standard drinks    Types: 10 Glasses of  wine per week    Comment: 5 glasses yesterday  . Drug use: Never    Review of Systems Constitutional: No fever/chills.  Positive for fatigue Eyes: No visual changes. ENT: No sore throat. Cardiovascular: Denies  chest pain. Respiratory: Denies shortness of breath. Gastrointestinal: No abdominal pain.  No nausea, no vomiting.  No diarrhea.  Positive for constipation. Genitourinary: Negative for dysuria. Musculoskeletal: Negative for neck pain.  Negative for back pain. Integumentary: Negative for rash. Neurological: Negative for headaches, focal weakness or numbness. Psychiatric:  Positive for feelings of anxiety  ____________________________________________   PHYSICAL EXAM:  VITAL SIGNS: ED Triage Vitals  Enc Vitals Group     BP 05/18/19 0510 (!) 162/94     Pulse Rate 05/18/19 0510 (!) 108     Resp 05/18/19 0510 16     Temp --      Temp src --      SpO2 05/18/19 0510 95 %     Weight 05/18/19 0355 54.4 kg (120 lb)     Height 05/18/19 0355 1.575 m (5\' 2" )     Head Circumference --      Peak Flow --      Pain Score 05/18/19 0355 0     Pain Loc --      Pain Edu? --      Excl. in [place]? --     Constitutional: Alert and oriented.  Eyes: Conjunctivae are normal.  Scleral icterus Head: Atraumatic. Mouth/Throat: Patient is wearing a mask. Neck: No stridor.  No meningeal signs.   Cardiovascular: Normal rate, regular rhythm. Good peripheral circulation. Grossly normal heart sounds. Respiratory: Normal respiratory effort.  No retractions. Gastrointestinal: Soft and nontender. No distention.  Musculoskeletal: No lower extremity tenderness nor edema. No gross deformities of extremities. Neurologic:  Normal speech and language. No gross focal neurologic deficits are appreciated.  Skin:  Skin is warm, dry and intact.  Jaundice Psychiatric: Mood and affect are normal. Speech and behavior are normal.  ____________________________________________   LABS (all labs ordered are listed, but only abnormal results are displayed)  Labs Reviewed  CBC WITH DIFFERENTIAL/PLATELET - Abnormal; Notable for the following components:      Result Value   WBC 3.9 (*)    RBC 3.28 (*)    Hemoglobin 11.6 (*)     HCT 34.3 (*)    MCV 104.6 (*)    MCH 35.4 (*)    Platelets 56 (*)    All other components within normal limits  COMPREHENSIVE METABOLIC PANEL - Abnormal; Notable for the following components:   Potassium 3.3 (*)    Glucose, Bld 129 (*)    AST 91 (*)    Total Bilirubin 6.2 (*)    All other components within normal limits  URINALYSIS, COMPLETE (UACMP) WITH MICROSCOPIC  ETHANOL  AMMONIA  POC SARS CORONAVIRUS 2 AG -  ED  TROPONIN I (HIGH SENSITIVITY)      Procedures   ____________________________________________   INITIAL IMPRESSION / MDM / ASSESSMENT AND PLAN / ED COURSE  As part of my medical decision making, I reviewed the following data within the electronic MEDICAL RECORD NUMBER   54 year old female presented with above-stated history and physical exam differential diagnosis including but not limited to alcohol intoxication, hyperammonemia.  Patient's ammonia level noted to be 65.  Patient received lactulose 30 g p.o.  Patient states that she does have her lactulose at home despite the fact that she has not been taking it.  I informed the patient of  the dire prognosis if she were to continue to do so.  Patient assured me that she would take her lactulose as prescribed.  In addition patient was given outpatient resources for detox by the behavioral medicine staff.  ____________________________________________  FINAL CLINICAL IMPRESSION(S) / ED DIAGNOSES  Final diagnoses:  Hyperammonemia ([place])     MEDICATIONS GIVEN DURING THIS VISIT:  Medications  lactulose (CHRONULAC) 10 GM/15ML solution 30 g (has no administration in time range)  LORazepam (ATIVAN) injection 1 mg (1 mg Intravenous Given 05/18/19 0519)     ED Discharge Orders    None      *Please note:  Lisa Dennis was evaluated in Emergency Department on 05/18/2019 for the symptoms described in the history of present illness. She was evaluated in the context of the global COVID-19 pandemic, which  necessitated consideration that the patient might be at risk for infection with the SARS-CoV-2 virus that causes COVID-19. Institutional protocols and algorithms that pertain to the evaluation of patients at risk for COVID-19 are in a state of rapid change based on information released by regulatory bodies including the CDC and federal and state organizations. These policies and algorithms were followed during the patient's care in the ED.  Some ED evaluations and interventions may be delayed as a result of limited staffing during the pandemic.*  Note:  This document was prepared using Dragon voice recognition software and may include unintentional dictation errors.   Gregor Hams, MD 05/18/19 [phone]

## 2019-05-27 ENCOUNTER — Observation Stay
Admission: EM | Admit: 2019-05-27 | Discharge: 2019-05-29 | Disposition: A | Discharge status: Home / Self Care | Payer: PRIVATE HEALTH INSURANCE | Attending: Internal Medicine | Admitting: Internal Medicine

## 2019-05-27 ENCOUNTER — Other Ambulatory Visit: Payer: Self-pay

## 2019-05-27 DIAGNOSIS — K7460 Unspecified cirrhosis of liver: Principal | ICD-10-CM | POA: Insufficient documentation

## 2019-05-27 DIAGNOSIS — E871 Hypo-osmolality and hyponatremia: Secondary | ICD-10-CM | POA: Diagnosis not present

## 2019-05-27 DIAGNOSIS — F419 Anxiety disorder, unspecified: Secondary | ICD-10-CM | POA: Insufficient documentation

## 2019-05-27 DIAGNOSIS — F10239 Alcohol dependence with withdrawal, unspecified: Secondary | ICD-10-CM | POA: Insufficient documentation

## 2019-05-27 DIAGNOSIS — F10229 Alcohol dependence with intoxication, unspecified: Secondary | ICD-10-CM | POA: Diagnosis not present

## 2019-05-27 DIAGNOSIS — Z79899 Other long term (current) drug therapy: Secondary | ICD-10-CM | POA: Diagnosis not present

## 2019-05-27 DIAGNOSIS — D6959 Other secondary thrombocytopenia: Secondary | ICD-10-CM | POA: Insufficient documentation

## 2019-05-27 DIAGNOSIS — K7290 Hepatic failure, unspecified without coma: Secondary | ICD-10-CM

## 2019-05-27 DIAGNOSIS — Z87891 Personal history of nicotine dependence: Secondary | ICD-10-CM | POA: Insufficient documentation

## 2019-05-27 DIAGNOSIS — K7030 Alcoholic cirrhosis of liver without ascites: Secondary | ICD-10-CM | POA: Diagnosis present

## 2019-05-27 DIAGNOSIS — E876 Hypokalemia: Secondary | ICD-10-CM | POA: Insufficient documentation

## 2019-05-27 DIAGNOSIS — Z95828 Presence of other vascular implants and grafts: Secondary | ICD-10-CM

## 2019-05-27 DIAGNOSIS — R5383 Other fatigue: Secondary | ICD-10-CM | POA: Diagnosis present

## 2019-05-27 DIAGNOSIS — Z20822 Contact with and (suspected) exposure to covid-19: Secondary | ICD-10-CM | POA: Insufficient documentation

## 2019-05-27 LAB — SAMPLE TO BLOOD BANK

## 2019-05-27 LAB — URINALYSIS, COMPLETE (UACMP) WITH MICROSCOPIC
Bacteria, UA: NONE SEEN
Glucose, UA: NEGATIVE mg/dL
Hgb urine dipstick: NEGATIVE
Ketones, ur: NEGATIVE mg/dL
Leukocytes,Ua: NEGATIVE
Nitrite: NEGATIVE
Protein, ur: 30 mg/dL — AB
Specific Gravity, Urine: 1.028 (ref 1.005–1.030)
pH: 6 (ref 5.0–8.0)

## 2019-05-27 LAB — HEPATIC FUNCTION PANEL
ALT: 31 U/L (ref 0–44)
AST: 56 U/L — ABNORMAL HIGH (ref 15–41)
Albumin: 3.5 g/dL (ref 3.5–5.0)
Alkaline Phosphatase: 94 U/L (ref 38–126)
Bilirubin, Direct: 3.9 mg/dL — ABNORMAL HIGH (ref 0.0–0.2)
Indirect Bilirubin: 8 mg/dL — ABNORMAL HIGH (ref 0.3–0.9)
Total Bilirubin: 11.9 mg/dL — ABNORMAL HIGH (ref 0.3–1.2)
Total Protein: 7.9 g/dL (ref 6.5–8.1)

## 2019-05-27 LAB — BASIC METABOLIC PANEL
Anion gap: 10 (ref 5–15)
BUN: 16 mg/dL (ref 6–20)
CO2: 23 mmol/L (ref 22–32)
Calcium: 10.2 mg/dL (ref 8.9–10.3)
Chloride: 97 mmol/L — ABNORMAL LOW (ref 98–111)
Creatinine, Ser: 0.52 mg/dL (ref 0.44–1.00)
GFR calc Af Amer: >60 mL/min (ref >60)
GFR calc non Af Amer: >60 mL/min (ref >60)
Glucose, Bld: 135 mg/dL — ABNORMAL HIGH (ref 70–99)
Potassium: 3.9 mmol/L (ref 3.5–5.1)
Sodium: 130 mmol/L — ABNORMAL LOW (ref 135–145)

## 2019-05-27 LAB — CBC
HCT: 32.9 % — ABNORMAL LOW (ref 36.0–46.0)
Hemoglobin: 11.3 g/dL — ABNORMAL LOW (ref 12.0–15.0)
MCH: 35.4 pg — ABNORMAL HIGH (ref 26.0–34.0)
MCHC: 34.3 g/dL (ref 30.0–36.0)
MCV: 103.1 fL — ABNORMAL HIGH (ref 80.0–100.0)
Platelets: 107 10*3/uL — ABNORMAL LOW (ref 150–400)
RBC: 3.19 MIL/uL — ABNORMAL LOW (ref 3.87–5.11)
RDW: 13.1 % (ref 11.5–15.5)
WBC: 6.1 10*3/uL (ref 4.0–10.5)
nRBC: 0 % (ref 0.0–0.2)

## 2019-05-27 LAB — PREGNANCY, URINE: Preg Test, Ur: NEGATIVE

## 2019-05-27 LAB — MAGNESIUM: Magnesium: 1.1 mg/dL — ABNORMAL LOW (ref 1.7–2.4)

## 2019-05-27 LAB — PROTIME-INR
INR: 1.6 — ABNORMAL HIGH (ref 0.8–1.2)
Prothrombin Time: 19 seconds — ABNORMAL HIGH (ref 11.4–15.2)

## 2019-05-27 LAB — PHOSPHORUS: Phosphorus: UNDETERMINED mg/dL (ref 2.5–4.6)

## 2019-05-27 LAB — AMMONIA: Ammonia: 54 umol/L — ABNORMAL HIGH (ref 9–35)

## 2019-05-27 MED ORDER — LORAZEPAM 2 MG/ML IJ SOLN: 1.0000 mg | INTRAMUSCULAR | Status: DC | PRN

## 2019-05-27 MED ORDER — THIAMINE HCL 100 MG PO TABS: 100.0000 mg | ORAL_TABLET | Freq: Every day | ORAL | Status: DC

## 2019-05-27 MED ORDER — ADULT MULTIVITAMIN W/MINERALS CH
1.0000 | ORAL_TABLET | Freq: Every day | ORAL | Status: DC
  Administered 2019-05-27 – 2019-05-29 (×3): 1 via ORAL
  Filled 2019-05-27 (×3): qty 1

## 2019-05-27 MED ORDER — ACETAMINOPHEN 650 MG RE SUPP: 650.0000 mg | Freq: Four times a day (QID) | RECTAL | Status: DC | PRN

## 2019-05-27 MED ORDER — LORAZEPAM 1 MG PO TABS: 1.0000 mg | ORAL_TABLET | ORAL | Status: DC | PRN

## 2019-05-27 MED ORDER — FOLIC ACID 1 MG PO TABS
1.0000 mg | ORAL_TABLET | Freq: Every day | ORAL | Status: DC
  Administered 2019-05-27 – 2019-05-29 (×3): 1 mg via ORAL
  Filled 2019-05-27 (×3): qty 1

## 2019-05-27 MED ORDER — THIAMINE HCL 100 MG PO TABS
100.0000 mg | ORAL_TABLET | Freq: Every day | ORAL | Status: DC
  Administered 2019-05-27 – 2019-05-29 (×3): 100 mg via ORAL
  Filled 2019-05-27 (×3): qty 1

## 2019-05-27 MED ORDER — LORAZEPAM 0.5 MG PO TABS
0.5000 mg | ORAL_TABLET | Freq: Once | ORAL | Status: AC
  Administered 2019-05-27: 0.5 mg via ORAL
  Filled 2019-05-27: qty 1

## 2019-05-27 MED ORDER — THIAMINE HCL 100 MG/ML IJ SOLN: 100.0000 mg | Freq: Every day | INTRAMUSCULAR | Status: DC

## 2019-05-27 MED ORDER — LACTULOSE 10 GM/15ML PO SOLN
10.0000 g | Freq: Three times a day (TID) | ORAL | Status: DC
  Administered 2019-05-27 – 2019-05-29 (×5): 10 g via ORAL
  Filled 2019-05-27 (×5): qty 30

## 2019-05-27 MED ORDER — FOLIC ACID 1 MG PO TABS: 1.0000 mg | ORAL_TABLET | Freq: Every day | ORAL | Status: DC

## 2019-05-27 MED ORDER — LORAZEPAM 2 MG/ML IJ SOLN: 0.0000 mg | Freq: Two times a day (BID) | INTRAMUSCULAR | Status: DC

## 2019-05-27 MED ORDER — ACETAMINOPHEN 325 MG PO TABS: 650.0000 mg | ORAL_TABLET | Freq: Four times a day (QID) | ORAL | Status: DC | PRN

## 2019-05-27 MED ORDER — ADULT MULTIVITAMIN W/MINERALS CH: 1.0000 | ORAL_TABLET | Freq: Every day | ORAL | Status: DC

## 2019-05-27 MED ORDER — LORAZEPAM 2 MG/ML IJ SOLN
0.0000 mg | Freq: Four times a day (QID) | INTRAMUSCULAR | Status: DC
  Administered 2019-05-28: 2 mg via INTRAVENOUS
  Filled 2019-05-27: qty 1

## 2019-05-27 NOTE — ED Notes (Signed)
Green, purple, red, type and screen, lactic, and ammonia sent.

## 2019-05-27 NOTE — ED Notes (Signed)
Transport to floor room 223.AS 

## 2019-05-27 NOTE — ED Notes (Signed)
Pt up to use bathroom- pt placed back on monitoring equipment when done

## 2019-05-27 NOTE — ED Notes (Signed)
Pt was trying to get urine sample in room 13 for RN.

## 2019-05-27 NOTE — H&P (Signed)
History and Physical    Lisa Dennis NFA:[id number] DOB: [birth year] DOA: 05/27/2019  PCP: Rusty Aus, MD   Patient coming from: home I have personally briefly reviewed patient's old medical records in [place]  Chief Complaint: weakness, forgetfulnes  HPI: Lisa Dennis is a 54 y.o. female with medical history significant for alcoholic liver cirrhosis, history of esophageal varices bleeding s/p banding and TI PS in August 2020 at [facility], hemorrhoids, anxiety, recently hospitalized from 04/03/2019 to 04/06/2019 with rectal bleeding, and who has abstained from alcohol for the past 4 to 5 days who presents to the emergency room with a complaint of weakness, tiredness and feeling like she is confused.  She states it feels similar to when her ammonia level is elevated and when she recently had GI bleeding. She denies abdominal pain, fever or chills.  She denies vomiting, denies blood in the stool.  She denies cough and shortness of breath.  Denies abdominal distention/swelling or swelling in the lower extremities.  She states a friend noticed increased yellow coloration of her skin  She states she has been using the lactulose intermittently based on her frequency of bowel movements as advised by her doctor..    ED Course: On arrival in the emergency room she was afebrile with BP 154/80 and overall normal vitals.  AST to ALT was 56/31, alk phos 94, total bilirubin elevated at 11.9, compared to 6.2 a couple weeks prior.  Ammonia 54, compared to most recent best of 21 in November 2020.  White cell count was 6000 with hemoglobin 11.3.  Platelets 107000, and improvement from 56000 2 weeks prior.  INR 1.6.  Hospitalist consulted for possible decompensated cirrhosis given worsening hyperbilirubinemia along with nausea vomiting and confusion.  Review of Systems: As per HPI otherwise 10 point review of systems negative.    Past Medical History:  Diagnosis Date  . Alcohol abuse   .  Anxiety   . Cirrhosis [facility])     Past Surgical History:  Procedure Laterality Date  . COLONOSCOPY WITH PROPOFOL N/A 04/06/2019   Procedure: COLONOSCOPY WITH PROPOFOL;  Surgeon: Lin Landsman, MD;  Location: [facility] ENDOSCOPY;  Service: Gastroenterology;  Laterality: N/A;  . ESOPHAGOGASTRODUODENOSCOPY (EGD) WITH PROPOFOL N/A 06/08/2018   Procedure: ESOPHAGOGASTRODUODENOSCOPY (EGD) WITH PROPOFOL;  Surgeon: Lucilla Lame, MD;  Location: [facility] ENDOSCOPY;  Service: Endoscopy;  Laterality: N/A;  . ESOPHAGOGASTRODUODENOSCOPY (EGD) WITH PROPOFOL N/A 11/12/2018   Procedure: ESOPHAGOGASTRODUODENOSCOPY (EGD) WITH PROPOFOL;  Surgeon: Jonathon Bellows, MD;  Location: [facility] ENDOSCOPY;  Service: Gastroenterology;  Laterality: N/A;     reports that she has quit smoking. She has never used smokeless tobacco. She reports current alcohol use of about 10.0 standard drinks of alcohol per week. She reports that she does not use drugs.  Allergies  Allergen Reactions  . Benadryl [Diphenhydramine] Other (See Comments)    Altered Mental Status  . Venlafaxine Hcl Er Itching  . Penicillins Hives and Other (See Comments)    Did it involve swelling of the face/tongue/throat, SOB, or low BP? No Did it involve sudden or severe rash/hives, skin peeling, or any reaction on the inside of your mouth or nose? Unknown Did you need to seek medical attention at a hospital or doctor's office? Unknown When did it last happen?unknown If all above answers are "NO", may proceed with cephalosporin use.  **patient tolerates ceftriaxone - Aug 2020   . Clindamycin/Lincomycin Swelling  . Ciprofloxacin Itching  . Latex Hives  . Morphine And Related  Hives  . Sulfa Antibiotics Hives  . Tetracyclines & Related Hives    History reviewed. No pertinent family history.   Prior to Admission medications   Medication Sig Start Date End Date Taking? Authorizing Provider  ciprofloxacin (CIPRO) 500 MG tablet Take 500 mg by mouth daily. 11/27/18    [provider]  folic acid (FOLVITE) 1 MG tablet Take 1 tablet (1 mg total) by mouth daily. 01/30/19   Carrie Mew, MD  nadolol (CORGARD) 20 MG tablet Take 1 tablet (20 mg total) by mouth daily. 01/30/19 04/30/19  Carrie Mew, MD  pantoprazole (PROTONIX) 40 MG tablet Take 1 tablet (40 mg total) by mouth daily. 01/30/19 04/30/19  Carrie Mew, MD  polyethylene glycol powder [facility]) 17 GM/SCOOP powder Take 17 g by mouth daily.    [provider]  thiamine 100 MG tablet Take 1 tablet (100 mg total) by mouth daily. 01/30/19   Carrie Mew, MD  venlafaxine XR (EFFEXOR-XR) 37.5 MG 24 hr capsule Take 37.5 mg by mouth daily. 12/11/18   [provider]    Physical Exam: Vitals:   05/27/19 1709 05/27/19 1800 05/27/19 1830 05/27/19 1930  BP: (!) 154/80 136/78 (!) 144/71 (!) 149/77  Pulse: 78 67 63 65  Resp: 19     Temp: 98.4 F (36.9 C)     TempSrc: Oral     SpO2: 98% 100% 99% 98%  Weight: 53.1 kg     Height: '5\' 2"'  (1.575 m)        Vitals:   05/27/19 1709 05/27/19 1800 05/27/19 1830 05/27/19 1930  BP: (!) 154/80 136/78 (!) 144/71 (!) 149/77  Pulse: 78 67 63 65  Resp: 19     Temp: 98.4 F (36.9 C)     TempSrc: Oral     SpO2: 98% 100% 99% 98%  Weight: 53.1 kg     Height: '5\' 2"'  (1.575 m)       Constitutional: Alert and awake, oriented x3, not in any acute distress. Eyes: PERLA, EOMI, irises appear normal, icteric sclera,  ENMT: external ears and nose appear normal, normal hearing             Lips appears normal, oropharynx mucosa, tongue, posterior pharynx appear normal  Neck: neck appears normal, no masses, normal ROM, no thyromegaly, no JVD  CVS: S1-S2 clear, no murmur rubs or gallops,  , no carotid bruits, pedal pulses palpable, No LE edema Respiratory:  clear to auscultation bilaterally, no wheezing, rales or rhonchi. Respiratory effort normal. No accessory muscle use.  Abdomen: soft nontender, nondistended, normal bowel sounds, no  hepatosplenomegaly, no hernias Musculoskeletal: : no cyanosis, clubbing , no contractures or atrophy Neuro: Cranial nerves II-XII intact, sensation, reflexes normal, strength Psych: judgement and insight appear normal, stable mood and affect,  Skin: no rashes or lesions or ulcers, no induration or nodules   Labs on Admission: I have personally reviewed following labs and imaging studies  CBC: Recent Labs  Lab 05/27/19 1719  WBC 6.1  HGB 11.3*  HCT 32.9*  MCV 103.1*  PLT 355*   Basic Metabolic Panel: Recent Labs  Lab 05/27/19 1719  NA 130*  K 3.9  CL 97*  CO2 23  GLUCOSE 135*  BUN 16  CREATININE 0.52  CALCIUM 10.2   GFR: Estimated Creatinine Clearance: 64.3 mL/min (by C-G formula based on SCr of 0.52 mg/dL). Liver Function Tests: Recent Labs  Lab 05/27/19 1719  AST 56*  ALT 31  ALKPHOS 94  BILITOT 11.9*  PROT  7.9  ALBUMIN 3.5   No results for input(s): LIPASE, AMYLASE in the last 168 hours. Recent Labs  Lab 05/27/19 1725  AMMONIA 54*   Coagulation Profile: Recent Labs  Lab 05/27/19 1719  INR 1.6*   Cardiac Enzymes: No results for input(s): CKTOTAL, CKMB, CKMBINDEX, TROPONINI in the last 168 hours. BNP (last 3 results) No results for input(s): PROBNP in the last 8760 hours. HbA1C: No results for input(s): HGBA1C in the last 72 hours. CBG: No results for input(s): GLUCAP in the last 168 hours. Lipid Profile: No results for input(s): CHOL, HDL, LDLCALC, TRIG, CHOLHDL, LDLDIRECT in the last 72 hours. Thyroid Function Tests: No results for input(s): TSH, T4TOTAL, FREET4, T3FREE, THYROIDAB in the last 72 hours. Anemia Panel: No results for input(s): VITAMINB12, FOLATE, FERRITIN, TIBC, IRON, RETICCTPCT in the last 72 hours. Urine analysis:    Component Value Date/Time   COLORURINE AMBER (A) 05/27/2019 1719   APPEARANCEUR CLEAR (A) 05/27/2019 1719   LABSPEC 1.028 05/27/2019 1719   PHURINE 6.0 05/27/2019 1719   GLUCOSEU NEGATIVE 05/27/2019 1719    HGBUR NEGATIVE 05/27/2019 1719   BILIRUBINUR MODERATE (A) 05/27/2019 1719   KETONESUR NEGATIVE 05/27/2019 1719   PROTEINUR 30 (A) 05/27/2019 1719   NITRITE NEGATIVE 05/27/2019 1719   LEUKOCYTESUR NEGATIVE 05/27/2019 1719    Radiological Exams on Admission: No results found.  EKG: Independently reviewed.   Assessment/Plan Principal Problem: Mild decompensated hepatic cirrhosis (HCC) without ascites Probable mild hepatic encephalopathy Alcoholic cirrhosis of liver without ascites (HCC) S/P TIPS (transjugular intrahepatic portosystemic shunt) at [facility] 10/2018 --Patient with n/v and confusion. thrombocytopenia, coagulopathy, hyponatremia of 130, bilirubin of 11.9, jaundice and confusion. -Continue to monitor mental status -Low-dose lactulose 10 g 3 times daily -hemoglobin stable but monitor for bleeding, in view of recent history of admission for hematochezia, history of variceal banding and TIPS  Alcohol use disorder -Patient's last drink was 4 to 5 days prior, possibly out of window -No overt signs of withdrawal -CIWA protocol -Smoking cessation counseling    DVT prophylaxis: scd due to coagulopathy Code Status: full code  Family Communication:  none  Disposition Plan: Back to previous home environment Consults called: none  Status:obs    Athena Masse MD [facility]     05/27/2019, 8:22 PM

## 2019-05-27 NOTE — ED Notes (Signed)
Attempted IV access x2.  ?

## 2019-05-27 NOTE — ED Notes (Signed)
Rainbow,T&S,ammonia,gray on ice was all sent to lab.

## 2019-05-27 NOTE — ED Provider Notes (Signed)
[facility] Emergency Department Provider Note  ____________________________________________   First MD Initiated Contact with Patient 05/27/19 1729     (approximate)  I have reviewed the triage vital signs and the nursing notes.  History  Chief Complaint Fatigue    HPI Lisa Dennis is a 54 y.o. female with history of anxiety, cirrhosis with varices, alcohol abuse, status post TIPS at [facility] in September 2020, who presents for nausea, fatigue, generalized weakness, forgetfulness, and new jaundice.  Symptoms have been ongoing for the last 2 to 3 days.  She states a friend of hers recently pointed out that her eyes had significantly yellowed, which is new for her. 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  She does note this could be attributed to the lactulose that she is taking.  She denies any abdominal pain, vomiting. She stopped drinking alcohol 5 days ago. She has been taking lactulose to try to help with her symptoms, took 3 doses yesterday but only had one BM today. No bloody emesis, bloody stool or dark stool.  Has noticed darkening of her urine.  On chart review, last labs done at [facility] November 2020 include AST 88, ALT 51, bilirubin 5.6.    Past Medical Hx Past Medical History:  Diagnosis Date  . Alcohol abuse   . Anxiety   . Cirrhosis [facility])     Problem List Patient Active Problem List   Diagnosis Date Noted  . 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 Rectal bleeding   . Acute GI bleeding 04/03/2019  . Alcohol withdrawal syndrome without complication ([place]) 123XX123  . Bleeding behind the abdominal cavity   . SBP (spontaneous bacterial peritonitis) ([place])   . Intra-abdominal varices   . Hemorrhage, intraperitoneal 11/15/2018  . Alcoholic cirrhosis  of liver with ascites ([place]) 11/13/2018  . Alcoholic intoxication with complication ([place]) [id number]  . Hepatic encephalopathy ([place]) 08/27/2018  . GI bleeding 06/08/2018  . Alcoholic cirrhosis of liver without ascites ([place])   . Hematemesis without nausea     Past Surgical Hx Past Surgical History:  Procedure Laterality Date  . COLONOSCOPY WITH PROPOFOL N/A 04/06/2019   Procedure: COLONOSCOPY WITH PROPOFOL;  Surgeon: Lin Landsman, MD;  Location: [facility] ENDOSCOPY;  Service: Gastroenterology;  Laterality: N/A;  . ESOPHAGOGASTRODUODENOSCOPY (EGD) WITH PROPOFOL N/A 06/08/2018   Procedure: ESOPHAGOGASTRODUODENOSCOPY (EGD) WITH PROPOFOL;  Surgeon: Lucilla Lame, MD;  Location: [facility] ENDOSCOPY;  Service: Endoscopy;  Laterality: N/A;  . ESOPHAGOGASTRODUODENOSCOPY (EGD) WITH PROPOFOL N/A 11/12/2018   Procedure: ESOPHAGOGASTRODUODENOSCOPY (EGD) WITH PROPOFOL;  Surgeon: Jonathon Bellows, MD;  Location: [facility] ENDOSCOPY;  Service: Gastroenterology;  Laterality: N/A;    Medications Prior to Admission medications   Medication Sig Start Date End Date Taking? Authorizing Provider  ciprofloxacin (CIPRO) 500 MG tablet Take 500 mg by mouth daily. 11/27/18   [provider]  folic acid (FOLVITE) 1 MG tablet Take 1 tablet (1 mg total) by mouth daily. 01/30/19   Carrie Mew, MD  nadolol (CORGARD) 20 MG tablet Take 1 tablet (20 mg total) by mouth daily. 01/30/19 04/30/19  Carrie Mew, MD  pantoprazole (PROTONIX) 40 MG tablet Take 1 tablet (40 mg total) by mouth daily. 01/30/19 04/30/19  Carrie Mew, MD  polyethylene glycol powder [facility]) 17 GM/SCOOP powder Take 17 g by mouth daily.    [provider]  thiamine  100 MG tablet Take 1 tablet (100 mg total) by mouth daily. 01/30/19   Carrie Mew, MD  venlafaxine XR (EFFEXOR-XR) 37.5 MG 24 hr capsule Take 37.5 mg by mouth daily. 12/11/18   [provider]    Allergies Benadryl [diphenhydramine], Venlafaxine hcl er,  Penicillins, Clindamycin/lincomycin, Ciprofloxacin, Latex, Morphine and related, Sulfa antibiotics, and Tetracyclines & related  Family Hx History reviewed. No pertinent family history.  Social Hx Social History   Tobacco Use  . Smoking status: Former Research scientist (life sciences)  . Smokeless tobacco: Never Used  Substance Use Topics  . Alcohol use: Yes    Alcohol/week: 10.0 standard drinks    Types: 10 Glasses of wine per week    Comment: 5 glasses yesterday  . Drug use: Never     Review of Systems  Constitutional: Negative for fever, chills.  Positive for fatigue. Eyes: Negative for visual changes. ENT: Negative for sore throat. Cardiovascular: Negative for chest pain. Respiratory: Negative for shortness of breath. Gastrointestinal: Positive for nausea, "stomach gurgling". Genitourinary: Negative for dysuria. Musculoskeletal: Negative for leg swelling. Skin: Positive for jaundice. Neurological: Negative for headaches.   Physical Exam  Vital Signs: ED Triage Vitals  Enc Vitals Group     BP 05/27/19 1709 (!) 154/80     Pulse Rate 05/27/19 1709 78     Resp 05/27/19 1709 19     Temp 05/27/19 1709 98.4 F (36.9 C)     Temp Source 05/27/19 1709 Oral     SpO2 05/27/19 1709 98 %     Weight 05/27/19 1709 117 lb (53.1 kg)     Height 05/27/19 1709 5\' 2"  (1.575 m)     Head Circumference --      Peak Flow --      Pain Score 05/27/19 1717 0     Pain Loc --      Pain Edu? --      Excl. in [place]? --     Constitutional: Alert and oriented, but occasionally forgetful, sometimes cannot recall which part of the history she has described already and will repeat parts. Head: Normocephalic. Atraumatic. Eyes: Conjunctivae clear, sclera markedly icteric. Pupils equal and symmetric. Nose: No masses or lesions. No congestion or rhinorrhea. Mouth/Throat: Wearing mask.  Neck: No stridor. Trachea midline.  Cardiovascular: Normal rate, regular rhythm. Extremities well perfused. Respiratory: Normal  respiratory effort.  Lungs CTAB. Gastrointestinal: Soft. Non-distended. Non-tender.  Genitourinary: Deferred. Musculoskeletal: No lower extremity edema. No deformities. Neurologic:  Normal speech and language. No gross focal or lateralizing neurologic deficits are appreciated.  Anxious, but does not appear tremulous or to be in acute alcohol withdrawal. Skin: Marked jaundice from the face through the abdominal wall distally. Psychiatric: Extremely anxious.   EKG  Personally reviewed and interpreted by myself.   Rate: 71 Rhythm: sinus  Axis: leftward Intervals: WNL No acute ischemic changes No STEMI    Procedures  Procedure(s) performed (including critical care):  Procedures   Initial Impression / Assessment and Plan / MDM / ED Course  54 y.o. female who presents to the ED for nausea, fatigue, forgetfulness, jaundice.  Ddx: hyperammoniemia, mild hepatic encephalopathy, liver failure, decompensated cirrhosis, electrolyte abnormality, anxiety.  Will obtain labs and reassess. Last drink was 5 days ago, therefore likely out of window for acute complicated alcohol withdrawal. She is fairly anxious (hx of this at baseline), but does not appear tremulous or to be in active withdrawal.  Labs with multiple derangements, worsened from prior.  Sodium 130, total bilirubin 11.9, INR 1.6,  bilirubin in urine.  Normal creatinine.  MELD score 25. Hemoglobin stable.   Discussed admission for decompensated cirhossis with patient, who is agreeable. Discussed w/ hospitalist for admission.   _______________________________  As part of my medical decision making I have reviewed available labs, radiology tests, reviewed old records.  Final Clinical Impression(s) / ED Diagnosis  Final diagnoses:  Decompensated hepatic cirrhosis ([place])       Note:  This document was prepared using Dragon voice recognition software and may include unintentional dictation errors.   Lilia Pro., MD  05/27/19 2027

## 2019-05-27 NOTE — ED Triage Notes (Addendum)
Pt comes in POV with cirrhosis and recent d/c of drinking. Last drink 4 days ago. Pt feels weak and tired. Pt eyes are yellow. Pt needs medical clearance before able to go to AA. Pt has had high ammonia before and has been taking lactulose.

## 2019-05-28 DIAGNOSIS — K7290 Hepatic failure, unspecified without coma: Secondary | ICD-10-CM | POA: Diagnosis not present

## 2019-05-28 DIAGNOSIS — K7460 Unspecified cirrhosis of liver: Secondary | ICD-10-CM | POA: Diagnosis not present

## 2019-05-28 LAB — COMPREHENSIVE METABOLIC PANEL
ALT: 25 U/L (ref 0–44)
AST: 51 U/L — ABNORMAL HIGH (ref 15–41)
Albumin: 3 g/dL — ABNORMAL LOW (ref 3.5–5.0)
Alkaline Phosphatase: 82 U/L (ref 38–126)
Anion gap: 6 (ref 5–15)
BUN: 13 mg/dL (ref 6–20)
CO2: 27 mmol/L (ref 22–32)
Calcium: 9.8 mg/dL (ref 8.9–10.3)
Chloride: 102 mmol/L (ref 98–111)
Creatinine, Ser: 0.53 mg/dL (ref 0.44–1.00)
GFR calc Af Amer: >60 mL/min (ref >60)
GFR calc non Af Amer: >60 mL/min (ref >60)
Glucose, Bld: 125 mg/dL — ABNORMAL HIGH (ref 70–99)
Potassium: 3.2 mmol/L — ABNORMAL LOW (ref 3.5–5.1)
Sodium: 135 mmol/L (ref 135–145)
Total Bilirubin: 9 mg/dL — ABNORMAL HIGH (ref 0.3–1.2)
Total Protein: 6.8 g/dL (ref 6.5–8.1)

## 2019-05-28 LAB — CBC
HCT: 30.4 % — ABNORMAL LOW (ref 36.0–46.0)
Hemoglobin: 10.3 g/dL — ABNORMAL LOW (ref 12.0–15.0)
MCH: 34.8 pg — ABNORMAL HIGH (ref 26.0–34.0)
MCHC: 33.9 g/dL (ref 30.0–36.0)
MCV: 102.7 fL — ABNORMAL HIGH (ref 80.0–100.0)
Platelets: 86 10*3/uL — ABNORMAL LOW (ref 150–400)
RBC: 2.96 MIL/uL — ABNORMAL LOW (ref 3.87–5.11)
RDW: 13 % (ref 11.5–15.5)
WBC: 5.7 10*3/uL (ref 4.0–10.5)
nRBC: 0 % (ref 0.0–0.2)

## 2019-05-28 LAB — PROTIME-INR
INR: 1.7 — ABNORMAL HIGH (ref 0.8–1.2)
Prothrombin Time: 19.5 seconds — ABNORMAL HIGH (ref 11.4–15.2)

## 2019-05-28 LAB — MAGNESIUM: Magnesium: 1.2 mg/dL — ABNORMAL LOW (ref 1.7–2.4)

## 2019-05-28 LAB — SARS CORONAVIRUS 2 (TAT 6-24 HRS): SARS Coronavirus 2: NEGATIVE

## 2019-05-28 MED ORDER — POTASSIUM CHLORIDE 10 MEQ/100ML IV SOLN
10.0000 meq | INTRAVENOUS | Status: AC
  Administered 2019-05-28 (×4): 10 meq via INTRAVENOUS
  Filled 2019-05-28 (×4): qty 100

## 2019-05-28 MED ORDER — MAGNESIUM SULFATE 2 GM/50ML IV SOLN
2.0000 g | Freq: Once | INTRAVENOUS | Status: AC
  Administered 2019-05-28: 2 g via INTRAVENOUS
  Filled 2019-05-28: qty 50

## 2019-05-28 MED ORDER — NADOLOL 20 MG PO TABS: 20.0000 mg | ORAL_TABLET | Freq: Every day | ORAL | Status: DC

## 2019-05-28 MED ORDER — LORAZEPAM 0.5 MG PO TABS
0.5000 mg | ORAL_TABLET | Freq: Four times a day (QID) | ORAL | Status: DC | PRN
  Administered 2019-05-29: 0.5 mg via ORAL
  Filled 2019-05-28 (×2): qty 1

## 2019-05-28 MED ORDER — CIPROFLOXACIN HCL 500 MG PO TABS
500.0000 mg | ORAL_TABLET | Freq: Every day | ORAL | Status: DC
  Administered 2019-05-28 – 2019-05-29 (×2): 500 mg via ORAL
  Filled 2019-05-28 (×2): qty 1

## 2019-05-28 MED ORDER — NADOLOL 20 MG PO TABS
20.0000 mg | ORAL_TABLET | Freq: Every day | ORAL | Status: DC
  Administered 2019-05-29: 10:00:00 20 mg via ORAL
  Filled 2019-05-28 (×2): qty 1

## 2019-05-28 MED ORDER — PANTOPRAZOLE SODIUM 40 MG PO TBEC
40.0000 mg | DELAYED_RELEASE_TABLET | Freq: Every day | ORAL | Status: DC
  Administered 2019-05-28 – 2019-05-29 (×2): 40 mg via ORAL
  Filled 2019-05-28 (×2): qty 1

## 2019-05-28 NOTE — Progress Notes (Signed)
PROGRESS NOTE  Lisa Dennis ZDG:[id number] DOB: 1965/09/18 DOA: 05/27/2019 PCP: Rusty Aus, MD  Brief History   Lisa Dennis is a 54 y.o. female with medical history significant for alcoholic liver cirrhosis, history of esophageal varices bleeding s/p banding and TI PS in August 2020 at [facility], hemorrhoids, anxiety, recently hospitalized from 04/03/2019 to 04/06/2019 with rectal bleeding, and who has abstained from alcohol for the past 4 to 5 days who presents to the emergency room with a complaint of weakness, tiredness and feeling like she is confused.  She states it feels similar to when her ammonia level is elevated and when she recently had GI bleeding. She denies abdominal pain, fever or chills.  She denies vomiting, denies blood in the stool.  She denies cough and shortness of breath.  Denies abdominal distention/swelling or swelling in the lower extremities.  She states a friend noticed increased yellow coloration of her skin  She states she has been using the lactulose intermittently based on her frequency of bowel movements as advised by her doctor..    On arrival in the emergency room she was afebrile with BP 154/80 and overall normal vitals.  AST to ALT was 56/31, alk phos 94, total bilirubin elevated at 11.9, compared to 6.2 a couple weeks prior.  Ammonia 54, compared to most recent best of 21 in November 2020.  White cell count was 6000 with hemoglobin 11.3.  Platelets 107000, and improvement from 56000 2 weeks prior.  INR 1.6.  Hospitalist consulted for possible decompensated cirrhosis given worsening hyperbilirubinemia along with nausea vomiting and confusion.  The patient was admitted to a telemetry bed. Her lactulose was held. She has been given ativan and placed on a CIWA protocol. Bilirubin remains elevated. The patient states that this is not unusual for her. Her hepatologist is Dr. Posey Pronto from [facility]. She tells me that he is out of town. She has been continued on her  home meds as possible.  Consultants  . None  Procedures  . None.  Antibiotics   Anti-infectives (From admission, onward)   Start     Dose/Rate Route Frequency Ordered Stop   05/28/19 1600  ciprofloxacin (CIPRO) tablet 500 mg     500 mg Oral Daily 05/28/19 1549      .   Subjective  The patient is sitting up in bed. No new complaints, except that she feels anxious.  Objective   Vitals:  Vitals:   05/28/19 1432 05/28/19 1638  BP: (!) 97/55 102/64  Pulse: 81 77  Resp:    Temp:    SpO2:     Exam:  Constitutional:  . The patient is awake, alert, and oriented x 3. No acute distress. Eyes:  . pupils and irises appear normal . Normal lids and conjunctivae ENMT:  . grossly normal hearing  . Lips appear normal . external ears, nose appear normal . Oropharynx: mucosa, tongue,posterior pharynx appear normal Neck:  . neck appears normal, no masses, normal ROM, supple . no thyromegaly Respiratory:  . No increased work of breathing. . No wheezes, rales, or rhonchi . No tactile fremitus Cardiovascular:  . Regular rate and rhythm . No murmurs, ectopy, or gallups. . No lateral PMI. No thrills. Abdomen:  . Abdomen is soft, non-tender, non-distended . No hernias, masses, or organomegaly . Normoactive bowel sounds.  Musculoskeletal:  . No cyanosis, clubbing, or edema Skin:  . No rashes, lesions, ulcers . palpation of skin: no induration or nodules Neurologic:  .  CN 2-12 intact . Sensation all 4 extremities intact Psychiatric:  . Mental status o Mood, affect appropriate o Orientation to person, place, time  . judgment and insight appear intact   I have personally reviewed the following:   Today's Data  . Vitals, CBC, CMP  Scheduled Meds: . ciprofloxacin  500 mg Oral Daily  . folic acid  1 mg Oral Daily  . lactulose  10 g Oral TID  . LORazepam  0-4 mg Intravenous Q6H   Followed by  . [START ON 05/29/2019] LORazepam  0-4 mg Intravenous Q12H  . multivitamin  with minerals  1 tablet Oral Daily  . nadolol  20 mg Oral Daily  . pantoprazole  40 mg Oral Daily  . thiamine  100 mg Oral Daily   Or  . thiamine  100 mg Intravenous Daily   Continuous Infusions:  Principal Problem:   Decompensated hepatic cirrhosis (HCC) Active Problems:   Alcoholic cirrhosis of liver without ascites (HCC)   S/P TIPS (transjugular intrahepatic portosystemic shunt)   LOS: 0 days   A & P  Mild decompensated hepatic cirrhosis ([place]) without ascites: The patient is S/P TIPS (transjugular intrahepatic portosystemic shunt) at [facility] 10/2018. Nausea and vomiting and confusion have resolved. Although the patient states that she came to the ED, because she was concerned that she might be confused and she lives alone. She now does not think that she was actually confused. She states that what she was was very anxious. The patient states that her last drink was 4-5 days ago. She states that her PCP has been covering her with oral Ativan, but that she is now out of it. Lactulose has been continued. Unknown how compliant patient is with lactulose given her stated response to the medication. She states that she just takes it as she needs it.  Hyponatremia: Likely chronic and due to alcohol. Resolving.   Thrombocytopenia: Due to cirrhosis. Somewhat worse than yesterday. Monitor and avoid medications that can lower platelets.  Recent hematochezia as the cause for her recent admission to [facility]. Hemoglobin has dropped from 11.3 to 10.3. Monitor in view of history of variceal banding and TIPS.   Alcohol use disorder: Likely in some level of withdrawal. Her last drink was 4-5 days ago, although she has also been taking ativan prescribed for her by PCP. She seems to be out of it. Primary sign of withdrawal is her anxiety, although the patient states that she is anxious at baseline due to family stressors and chronic mental illness. She is on a CIWA protocol, but I have also made Ativan available  on an as needed basis.  Tobacco Abuse: 7-10 minutes of smoking cessation counseling given.  I have seen and examined the patient myself. I have spent 38 minutes in her evaluation and care.   DVT prophylaxis: scd due to coagulopathy Code Status: full code  Family Communication:  none  Disposition Plan: Back to previous home environment   Lowry Bala, DO [facility] Direct contact: see [email or web address]  7PM-7AM contact night coverage as above 05/28/2019, 6:36 PM  LOS: 0 days

## 2019-05-28 NOTE — Progress Notes (Signed)
OT Cancellation Note  Patient Details Name: Lisa Dennis MRN: [id number] DOB: 01-01-1966   Cancelled Treatment:    Reason Eval/Treat Not Completed: OT screened, no needs identified, will sign off  OT consult received and chart reviewed. Upon speaking with patient and PT, pt was walking with no AD, no balance issues noted, and pt reports Independently performing ADLs at this time. Pt is appropriate and A&O. Will complete OT order at this time. Please re-consult should needs change. Thank you.  Gerrianne Scale, [place], OTR/L ascom [phone] 05/28/19, 4:46 PM

## 2019-05-28 NOTE — TOC Initial Note (Signed)
Transition of Care [facility]) - Initial/Assessment Note    Patient Details  Name: Lisa Dennis MRN: [id number] Date of Birth: 1965/11/22  Transition [facility] CM/SW Contact:    Shelbie Ammons, RN Phone Number: 05/28/2019, 4:03 PM  Clinical Narrative:    RNCM assessment completed via telephone. Patient consult placed due to need for resources for rehab from alcohol. Patient picked up phone after a few rings and reports she is beginning to feel some better. Explained to patient role of CM at hospital and reason for consult. Patient reports that she appreciates the help but that she has already done the research, she reports that she already has a sponsor and she has discussed her options with her primary doctor and now as soon as she medical clearance she plans to start her treatment. CM discussed that she would remain available for any further needs.                Expected Discharge Plan: Home/Self Care Barriers to Discharge: Continued Medical Work up   Patient Goals and CMS Choice Patient states their goals for this hospitalization and ongoing recovery are:: to get medically straightened out so I can start my rehab      Expected Discharge Plan and Services Expected Discharge Plan: Home/Self Care   Discharge Planning Services: CM Consult   Living arrangements for the past 2 months: Single Family Home                                      Prior Living Arrangements/Services Living arrangements for the past 2 months: Single Family Home Lives with:: Self Patient language and need for interpreter reviewed:: No        Need for Family Participation in Patient Care: No (Comment) Care giver support system in place?: No (comment)   Criminal Activity/Legal Involvement Pertinent to Current Situation/Hospitalization: No - Comment as needed  Activities of Daily Living Home Assistive Devices/Equipment: None ADL Screening (condition at time of admission) Patient's cognitive  ability adequate to safely complete daily activities?: Yes Is the patient deaf or have difficulty hearing?: No Does the patient have difficulty seeing, even when wearing glasses/contacts?: No Does the patient have difficulty concentrating, remembering, or making decisions?: No Patient able to express need for assistance with ADLs?: Yes Does the patient have difficulty dressing or bathing?: No Independently performs ADLs?: Yes (appropriate for developmental age) Does the patient have difficulty walking or climbing stairs?: No Weakness of Legs: None Weakness of Arms/Hands: None  Permission Sought/Granted                  Emotional Assessment   Attitude/Demeanor/Rapport: Engaged   Orientation: : Oriented to Self, Oriented to Place, Oriented to  Time, Oriented to Situation Alcohol / Substance Use: Alcohol Use Psych Involvement: No (comment)  Admission diagnosis:  Decompensated hepatic cirrhosis ([place]) [K72.90, K74.60] Patient Active Problem List   Diagnosis Date Noted  . 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 Rectal bleeding   . Acute GI bleeding 04/03/2019  . Alcohol withdrawal syndrome without complication ([place]) 123XX123  . Bleeding behind the abdominal cavity   . SBP (spontaneous bacterial peritonitis) ([place])   . Intra-abdominal varices   . Hemorrhage, intraperitoneal 11/15/2018  . Alcoholic cirrhosis of liver with ascites ([place]) 11/13/2018  . Alcoholic intoxication with complication ([place]) 123XX123  . Decompensated hepatic cirrhosis ([place]) 08/27/2018  . GI bleeding  06/08/2018  . Alcoholic cirrhosis of liver without ascites ([place])   . Hematemesis without nausea    PCP:  Rusty Aus, MD Pharmacy:   [facility] [phone], [place] [address] Phone: [phone] Fax: [phone]     Social Determinants of Health ([place]) Interventions     Readmission Risk Interventions Readmission Risk Prevention Plan 08/30/2018  Transportation Screening Complete  PCP or Specialist Appt within 5-7 Days Not Complete  Not Complete comments unable to make pcp appt on a Sunday  Home Care Screening Complete  Medication Review (RN CM) Complete  Some encounter information is confidential and restricted. Go to Review Flowsheets activity to see all data.

## 2019-05-28 NOTE — Evaluation (Signed)
Physical Therapy Evaluation Patient Details Name: Lisa Dennis MRN: [id number] DOB: Jun 18, 1965 Today's Date: 05/28/2019   History of Present Illness  Per MD note: Pt is a 54 y.o. female with medical history significant for alcohol abuse, liver cirrhosis, history of esophageal variceal bleeding status post banding and TIPS in August 2020 at [facility], hemorrhoids, anxiety, presented to the hospital for nausea, fatigue, generalized weakness, forgetfulness, and new jaundice.  MD assessment includes: mild decompensated hepatic cirrhosis without ascites and probable mild hepatic encephalopathy.    Clinical Impression  Pt pleasant and motivated to participate during the session. Pt was ind with all bed mobility, transfers, ambulation, and going up/down stairs. Pt amb without an assistive device without any signs of instability or LOB. Pt appears to be at her PLOF with no skilled PT needs identified at this time. Will complete PT orders at this time but will reassess pt pending a change in status upon receipt of new PT orders.    Follow Up Recommendations No PT follow up    Equipment Recommendations  None recommended by PT    Recommendations for Other Services       Precautions / Restrictions        Mobility  Bed Mobility Overal bed mobility: Independent             General bed mobility comments: no difficulties with bed mobility  Transfers Overall transfer level: Independent               General transfer comment: no difficulties with transfers  Ambulation/Gait Ambulation/Gait assistance: Independent Gait Distance (Feet): 250 Feet Assistive device: None Gait Pattern/deviations: WFL(Within Functional Limits) Gait velocity: slightly decreased secondary to pt not feeling safe walking quickly in hospital socks   General Gait Details: only assistance was IV pole management  Stairs Stairs: Yes Stairs assistance: Independent Stair Management: Two  rails;Alternating pattern;Step to pattern;Forwards;Backwards(Pt went up forward and down backward secondary to IV lines. Pt sometimes used a step-to pattern and other times did a step-through pattern) Number of Stairs: 8 General stair comments: assistance with IV lines provided  Wheelchair Mobility    Modified Rankin (Stroke Patients Only)       Balance Overall balance assessment: Independent                                           Pertinent Vitals/Pain Pain Assessment: No/denies pain    Home Living Family/patient expects to be discharged to:: Private residence Living Arrangements: Alone Available Help at Discharge: Friend(s);Available PRN/intermittently Type of Home: House Home Access: Stairs to enter Entrance Stairs-Rails: Right;Left;Can reach both Entrance Stairs-Number of Steps: 10 Home Layout: One level Home Equipment: Grab bars - tub/shower;Grab bars - toilet;Crutches      Prior Function Level of Independence: Independent         Comments: Ind Amb community distances without an AD, no falls in the last 6 months, Ind with ADLs     Hand Dominance        Extremity/Trunk Assessment        Lower Extremity Assessment Lower Extremity Assessment: Generalized weakness       Communication   Communication: No difficulties  Cognition Arousal/Alertness: Awake/alert Behavior During Therapy: WFL for tasks assessed/performed Overall Cognitive Status: Within Functional Limits for tasks assessed  General Comments      Exercises Total Joint Exercises Ankle Circles/Pumps: AROM;Strengthening;Both;10 reps Quad Sets: Strengthening;Both;10 reps Gluteal Sets: Strengthening;Both;10 reps Long Arc Quad: AROM;Strengthening;Both;10 reps   Assessment/Plan    PT Assessment Patent does not need any further PT services  PT Problem List         PT Treatment Interventions      PT Goals  (Current goals can be found in the Care Plan section)  Acute Rehab PT Goals PT Goal Formulation: All assessment and education complete, DC therapy    Frequency     Barriers to discharge        Co-evaluation               AM-PAC PT "6 Clicks" Mobility  Outcome Measure Help needed turning from your back to your side while in a flat bed without using bedrails?: None Help needed moving from lying on your back to sitting on the side of a flat bed without using bedrails?: None Help needed moving to and from a bed to a chair (including a wheelchair)?: None Help needed standing up from a chair using your arms (e.g., wheelchair or bedside chair)?: None Help needed to walk in hospital room?: None Help needed climbing 3-5 steps with a railing? : None 6 Click Score: 24    End of Session Equipment Utilized During Treatment: Gait belt Activity Tolerance: Patient tolerated treatment well Patient left: in bed   PT Visit Diagnosis: Muscle weakness (generalized) (M62.81)    Time: [id number] PT Time Calculation (min) (ACUTE ONLY): 17 min   Charges:              Annabelle Harman, SPT 05/28/19 5:08 PM

## 2019-05-29 DIAGNOSIS — K7290 Hepatic failure, unspecified without coma: Secondary | ICD-10-CM | POA: Diagnosis not present

## 2019-05-29 DIAGNOSIS — K7460 Unspecified cirrhosis of liver: Secondary | ICD-10-CM | POA: Diagnosis not present

## 2019-05-29 LAB — COMPREHENSIVE METABOLIC PANEL
ALT: 27 U/L (ref 0–44)
AST: 44 U/L — ABNORMAL HIGH (ref 15–41)
Albumin: 2.9 g/dL — ABNORMAL LOW (ref 3.5–5.0)
Alkaline Phosphatase: 105 U/L (ref 38–126)
Anion gap: 11 (ref 5–15)
BUN: 11 mg/dL (ref 6–20)
CO2: 25 mmol/L (ref 22–32)
Calcium: 9.8 mg/dL (ref 8.9–10.3)
Chloride: 102 mmol/L (ref 98–111)
Creatinine, Ser: 0.41 mg/dL — ABNORMAL LOW (ref 0.44–1.00)
GFR calc Af Amer: >60 mL/min (ref >60)
GFR calc non Af Amer: >60 mL/min (ref >60)
Glucose, Bld: 116 mg/dL — ABNORMAL HIGH (ref 70–99)
Potassium: 3.7 mmol/L (ref 3.5–5.1)
Sodium: 138 mmol/L (ref 135–145)
Total Bilirubin: 5.9 mg/dL — ABNORMAL HIGH (ref 0.3–1.2)
Total Protein: 6.7 g/dL (ref 6.5–8.1)

## 2019-05-29 LAB — CBC
HCT: 30.7 % — ABNORMAL LOW (ref 36.0–46.0)
Hemoglobin: 10.4 g/dL — ABNORMAL LOW (ref 12.0–15.0)
MCH: 35.3 pg — ABNORMAL HIGH (ref 26.0–34.0)
MCHC: 33.9 g/dL (ref 30.0–36.0)
MCV: 104.1 fL — ABNORMAL HIGH (ref 80.0–100.0)
Platelets: 84 10*3/uL — ABNORMAL LOW (ref 150–400)
RBC: 2.95 MIL/uL — ABNORMAL LOW (ref 3.87–5.11)
RDW: 13.2 % (ref 11.5–15.5)
WBC: 4.3 10*3/uL (ref 4.0–10.5)
nRBC: 0 % (ref 0.0–0.2)

## 2019-05-29 MED ORDER — ADULT MULTIVITAMIN W/MINERALS CH: 1.0000 | ORAL_TABLET | Freq: Every day | ORAL | 0 refills | Status: DC

## 2019-05-29 MED ORDER — LACTULOSE 10 GM/15ML PO SOLN: 10.0000 g | Freq: Three times a day (TID) | ORAL | 0 refills | Status: DC

## 2019-05-29 MED ORDER — THIAMINE HCL 100 MG PO TABS: 100.0000 mg | ORAL_TABLET | Freq: Every day | ORAL | 0 refills | Status: DC

## 2019-05-29 NOTE — Plan of Care (Signed)
Patient discharge instructions and RX reviewed and given to patient.  Patient verbalized understanding.  Patient states has had 2 BM today and will not take lactulose today as ordered.  Patient PIV and telemetry removed.  Patient discharge in w/c to home.  Patient states appreciation and verbalized understanding of discharge instructions.

## 2019-05-29 NOTE — Discharge Summary (Signed)
Physician Discharge Summary  Anmarie Fukushima [id number] DOB: 1965/08/25 DOA: 05/27/2019  PCP: Rusty Aus, MD  Admit date: 05/27/2019 Discharge date: 05/29/2019  Recommendations for Outpatient Follow-up:  1. Follow up with PCP in 7-10 days. 2. Hepatic panel to be drawn on that visit. 3. Follow up with Hepatology at [facility] in one month.  Discharge Diagnoses: Principal diagnosis is #1 1. Mild decompensation of hepatic cirrhosis, S/P TIPS 2. Anxiety 3. ETOH withdrawal, mild 4. Hyperbilirubinemia 5. Thrombocytopenia 6. Hypokalemia 7. Hypomagnesemia  Discharge Condition: Fair  Disposition: Home  Diet recommendation: Heart healthy  Filed Weights   05/27/19 1709 05/27/19 2302  Weight: 53.1 kg 55.1 kg    History of present illness:  Lisa Dennis is a 54 y.o. female with medical history significant for alcoholic liver cirrhosis, history of esophageal varices bleeding s/p banding and TI PS in August 2020 at [facility], hemorrhoids, anxiety, recently hospitalized from 04/03/2019 to 04/06/2019 with rectal bleeding, and who has abstained from alcohol for the past 4 to 5 days who presents to the emergency room with a complaint of weakness, tiredness and feeling like she is confused.  She states it feels similar to when her ammonia level is elevated and when she recently had GI bleeding. She denies abdominal pain, fever or chills.  She denies vomiting, denies blood in the stool.  She denies cough and shortness of breath.  Denies abdominal distention/swelling or swelling in the lower extremities.  She states a friend noticed increased yellow coloration of her skin  She states she has been using the lactulose intermittently based on her frequency of bowel movements as advised by her doctor..    ED Course: On arrival in the emergency room she was afebrile with BP 154/80 and overall normal vitals.  AST to ALT was 56/31, alk phos 94, total bilirubin elevated at 11.9, compared to 6.2 a couple  weeks prior.  Ammonia 54, compared to most recent best of 21 in November 2020.  White cell count was 6000 with hemoglobin 11.3.  Platelets 107000, and improvement from 56000 2 weeks prior.  INR 1.6.  Hospitalist consulted for possible decompensated cirrhosis given worsening hyperbilirubinemia along with nausea vomiting and confusion.  Hospital Course:  Lisa Dennis a 54 y.o.femalewith medical history significant foralcoholicliver cirrhosis, history of esophageal varices bleeding s/p banding and TI PS in August 2020 at [facility], hemorrhoids, anxiety, recently hospitalized from 04/03/2019 to 04/06/2019 with rectal bleeding, and who has abstained from alcohol for the past 4 to 5 days who presents to the emergency room with a complaint of weakness, tiredness andfeeling like she is confused.She states it feels similar to when her ammonia level is elevated and when she recently had GI bleeding. She denies abdominal pain, fever or chills.She denies vomiting, denies blood in the stool. She denies cough and shortness of breath. Denies abdominal distention/swelling or swelling in the lower extremities.She states a friend noticedincreased yellow coloration of her skin She states she has been using the lactulose intermittently based on her frequency of bowel movements as advised by her doctor..  On arrival in the emergency room she was afebrile with BP 154/80 and overall normal vitals. AST to ALT was 56/31, alk phos 94, total bilirubin elevated at 11.9, compared to 6.2 a couple weeks prior. Ammonia 54,compared to most recent best of 21 in November 2020. White cell count was 6000 with hemoglobin 11.3. Platelets 107000, and improvement from 560002 weeks prior. INR 1.6. Hospitalist consulted for possible decompensated  cirrhosis given worsening hyperbilirubinemia along with nausea vomiting and confusion.  The patient was admitted to a telemetry bed. Her lactulose was held. She has been given  ativan and placed on a CIWA protocol. Bilirubin remains elevated. The patient states that this is not unusual for her. Her hepatologist is Dr. Posey Pronto from [facility]. She tells me that he is out of town. She has been continued on her home meds as possible.  Today's assessment: S: The patient is resting comfortably. No new complaints. O: Vitals:  Vitals:   05/29/19 0455 05/29/19 1000  BP: 108/74 113/69  Pulse: 93 91  Resp: 20 18  Temp: 98.3 F (36.8 C)   SpO2: 98% 97%   Constitutional:   The patient is awake, alert, and oriented x 3. No acute distress. Eyes:   pupils and irises appear normal  Normal lids and conjunctivae ENMT:   grossly normal hearing   Lips appear normal  external ears, nose appear normal  Oropharynx: mucosa, tongue,posterior pharynx appear normal Neck:   neck appears normal, no masses, normal ROM, supple  no thyromegaly Respiratory:   No increased work of breathing.  No wheezes, rales, or rhonchi  No tactile fremitus Cardiovascular:   Regular rate and rhythm  No murmurs, ectopy, or gallups.  No lateral PMI. No thrills. Abdomen:   Abdomen is soft, non-tender, non-distended  No hernias, masses, or organomegaly  Normoactive bowel sounds.  Musculoskeletal:   No cyanosis, clubbing, or edema Skin:   No rashes, lesions, ulcers  palpation of skin: no induration or nodules Neurologic:   CN 2-12 intact  Sensation all 4 extremities intact Psychiatric:   Mental status ? Mood, affect appropriate ? Orientation to person, place, time   judgment and insight appear intact  Discharge Instructions  Discharge Instructions    Activity as tolerated - No restrictions   Complete by: As directed    Call MD for:  persistant dizziness or light-headedness   Complete by: As directed    Call MD for:  persistant nausea and vomiting   Complete by: As directed    Call MD for:  severe uncontrolled pain   Complete by: As directed    Call MD for:   temperature >100.4   Complete by: As directed    Diet - low sodium heart healthy   Complete by: As directed    Discharge instructions   Complete by: As directed    Follow up with PCP in 7-10 days. Hepatic panel to be drawn on that visit. Follow up with Hepatology at [facility] in one month.   Increase activity slowly   Complete by: As directed      Allergies as of 05/29/2019      Reactions   Benadryl [diphenhydramine] Other (See Comments)   Altered Mental Status   Venlafaxine Hcl Er Itching   Penicillins Hives, Other (See Comments)   Did it involve swelling of the face/tongue/throat, SOB, or low BP? No Did it involve sudden or severe rash/hives, skin peeling, or any reaction on the inside of your mouth or nose? Unknown Did you need to seek medical attention at a hospital or doctor's office? Unknown When did it last happen?unknown If all above answers are "NO", may proceed with cephalosporin use.  **patient tolerates ceftriaxone - Aug 2020   Clindamycin/lincomycin Swelling   Ciprofloxacin Itching   Latex Hives   Morphine And Related Hives   Sulfa Antibiotics Hives   Tetracyclines & Related Hives      Medication List  TAKE these medications   ciprofloxacin 500 MG tablet Commonly known as: CIPRO Take 500 mg by mouth daily.   folic acid 1 MG tablet Commonly known as: FOLVITE Take 1 tablet (1 mg total) by mouth daily.   lactulose 10 GM/15ML solution Commonly known as: CHRONULAC Take 15 mLs (10 g total) by mouth 3 (three) times daily. Hold for more than 2 loose bowel movements. What changed:   how much to take  additional instructions   LORazepam 0.5 MG tablet Commonly known as: ATIVAN Take 0.5 mg by mouth daily as needed.   multivitamin with minerals Tabs tablet Take 1 tablet by mouth daily.   nadolol 20 MG tablet Commonly known as: CORGARD Take 20 mg by mouth daily.   pantoprazole 40 MG tablet Commonly known as: PROTONIX Take 40 mg by mouth daily.    thiamine 100 MG tablet Take 1 tablet (100 mg total) by mouth daily.      Allergies  Allergen Reactions  . Benadryl [Diphenhydramine] Other (See Comments)    Altered Mental Status  . Venlafaxine Hcl Er Itching  . Penicillins Hives and Other (See Comments)    Did it involve swelling of the face/tongue/throat, SOB, or low BP? No Did it involve sudden or severe rash/hives, skin peeling, or any reaction on the inside of your mouth or nose? Unknown Did you need to seek medical attention at a hospital or doctor's office? Unknown When did it last happen?unknown If all above answers are "NO", may proceed with cephalosporin use.  **patient tolerates ceftriaxone - Aug 2020   . Clindamycin/Lincomycin Swelling  . Ciprofloxacin Itching  . Latex Hives  . Morphine And Related Hives  . Sulfa Antibiotics Hives  . Tetracyclines & Related Hives    The results of significant diagnostics from this hospitalization (including imaging, microbiology, ancillary and laboratory) are listed below for reference.    Significant Diagnostic Studies: No results found.  Microbiology: Recent Results (from the past 240 hour(s))  SARS CORONAVIRUS 2 (TAT 6-24 HRS) Nasopharyngeal Nasopharyngeal Swab     Status: None   Collection Time: 05/27/19  8:07 PM   Specimen: Nasopharyngeal Swab  Result Value Ref Range Status   SARS Coronavirus 2 NEGATIVE NEGATIVE Final    Comment: (NOTE) SARS-CoV-2 target nucleic acids are NOT DETECTED. The SARS-CoV-2 RNA is generally detectable in upper and lower respiratory specimens during the acute phase of infection. Negative results do not preclude SARS-CoV-2 infection, do not rule out co-infections with other pathogens, and should not be used as the sole basis for treatment or other patient management decisions. Negative results must be combined with clinical observations, patient history, and epidemiological information. The expected result is Negative. Fact Sheet for  Patients: SugarRoll.be Fact Sheet for Healthcare Providers: https://www.woods-mathews.com/ This test is not yet approved or cleared by the [place] FDA and  has been authorized for detection and/or diagnosis of SARS-CoV-2 by FDA under an Emergency Use Authorization (EUA). This EUA will remain  in effect (meaning this test can be used) for the duration of the COVID-19 declaration under Section 56 4(b)(1) of the Act, 21 U.S.C. section 360bbb-3(b)(1), unless the authorization is terminated or revoked sooner. Performed at [facility], [place] [address], [place], [place] [address]      Labs: Basic Metabolic Panel: Recent Labs  Lab 05/27/19 1719 05/28/19 0444 05/29/19 0614  NA 130* 135 138  K 3.9 3.2* 3.7  CL 97* 102 102  CO2 '23 27 25  ' GLUCOSE 135* 125* 116*  BUN '16 13 11  ' CREATININE 0.52 0.53 0.41*  CALCIUM 10.2 9.8 9.8  MG 1.1* 1.2*  --   PHOS UNABLE TO REPORT DUE TO ICTERUS  --   --    Liver Function Tests: Recent Labs  Lab 05/27/19 1719 05/28/19 0444 05/29/19 0614  AST 56* 51* 44*  ALT '31 25 27  ' ALKPHOS 94 82 105  BILITOT 11.9* 9.0* 5.9*  PROT 7.9 6.8 6.7  ALBUMIN 3.5 3.0* 2.9*   No results for input(s): LIPASE, AMYLASE in the last 168 hours. Recent Labs  Lab 05/27/19 1725  AMMONIA 54*   CBC: Recent Labs  Lab 05/27/19 1719 05/28/19 0444 05/29/19 0614  WBC 6.1 5.7 4.3  HGB 11.3* 10.3* 10.4*  HCT 32.9* 30.4* 30.7*  MCV 103.1* 102.7* 104.1*  PLT 107* 86* 84*   Cardiac Enzymes: No results for input(s): CKTOTAL, CKMB, CKMBINDEX, TROPONINI in the last 168 hours. BNP: BNP (last 3 results) No results for input(s): BNP in the last 8760 hours.  ProBNP (last 3 results) No results for input(s): PROBNP in the last 8760 hours.  CBG: No results for input(s): GLUCAP in the last 168 hours.  Principal Problem:   Decompensated hepatic cirrhosis (HCC) Active Problems:   Alcoholic cirrhosis of liver without  ascites (HCC)   S/P TIPS (transjugular intrahepatic portosystemic shunt)   Time coordinating discharge: 38 minutes  Signed:        Anjeanette Petzold, DO [facility]  05/29/2019, 5:34 PM

## 2019-05-29 NOTE — Discharge Instructions (Signed)
Cirrhosis  Cirrhosis is long-term (chronic) liver injury. The liver is the body's largest internal organ, and it performs many functions. It converts food into energy, removes toxic material from the blood, makes important proteins, and absorbs necessary vitamins from food. In cirrhosis, healthy liver cells are replaced by scar tissue. This prevents blood from flowing through the liver, making it difficult for the liver to function. Scarring of the liver cannot be reversed, but treatment can prevent it from getting worse. What are the causes? Common causes of this condition are hepatitis C and long-term alcohol abuse. Other causes include:  Nonalcoholic fatty liver disease. This happens when fat is deposited in the liver by causes other than alcohol.  Hepatitis B infection.  Autoimmune hepatitis. In this condition, the body's defense system (immune system) mistakenly attacks the liver cells, causing irritation and swelling (inflammation).  Diseases that cause blockage of ducts inside the liver.  Inherited liver diseases, such as hemochromatosis. This is one of the most common inherited liver diseases. In this disease, deposits of iron collect in the liver and other organs.  Reactions to certain long-term medicines, such as amiodarone, a heart medicine.  Parasitic infections. These include schistosomiasis, which is caused by a flatworm.  Long-term contact to certain toxins. These toxins include certain organic solvents, such as toluene and chloroform. What increases the risk? You are more likely to develop this condition if:  You have certain types of viral hepatitis.  You abuse alcohol, especially if you are female.  You are overweight.  You share needles.  You have unprotected sex with someone who has viral hepatitis. What are the signs or symptoms? You may not have any signs and symptoms at first. Symptoms may not develop until the damage to your liver starts to get worse. Early  symptoms may include:  Weakness and tiredness (fatigue).  Changes in sleep patterns or having trouble sleeping.  Itchiness.  Tenderness in the right-upper part of your abdomen.  Weight loss and muscle loss.  Nausea.  Loss of appetite.  Appearance of tiny blood vessels under the skin. Later symptoms may include:  Fatigue or weakness that is getting worse.  Yellow skin and eyes (jaundice).  Buildup of fluid in the abdomen (ascites). You may notice that your clothes are tight around your waist.  Weight gain.  Swelling of the feet and ankles (edema).  Trouble breathing.  Easy bruising and bleeding.  Vomiting blood.  Black or bloody stool.  Mental confusion. How is this diagnosed? Your health care provider may suspect cirrhosis based on your symptoms and medical history, especially if you have other medical conditions or a history of alcohol abuse. Your health care provider will do a physical exam to feel your liver and to check for signs of cirrhosis. He or she may perform other tests, including:  Blood tests to check: ? For hepatitis B or C. ? Kidney function. ? Liver function.  Imaging tests such as: ? MRI or CT scan to look for changes seen in advanced cirrhosis. ? Ultrasound to see if normal liver tissue is being replaced by scar tissue.  A procedure in which a long needle is used to take a sample of liver tissue to be checked in a lab (biopsy). Liver biopsy can confirm the diagnosis of cirrhosis. How is this treated? Treatment for this condition depends on how damaged your liver is and what caused the damage. It may include treating the symptoms of cirrhosis, or treating the underlying causes in order to  slow the damage. Treatment may include:  Making lifestyle changes, such as: ? Eating a healthy diet. You may need to work with your health care provider or a diet and nutrition specialist (dietitian) to develop an eating plan. ? Restricting salt  intake. ? Maintaining a healthy weight. ? Not abusing drugs or alcohol.  Taking medicines to: ? Treat liver infections or other infections. ? Control itching. ? Reduce fluid buildup. ? Reduce certain blood toxins. ? Reduce risk of bleeding from enlarged blood vessels in the stomach or esophagus (varices).  Liver transplant. In this procedure, a liver from a donor is used to replace your diseased liver. This is done if cirrhosis has caused liver failure. Other treatments and procedures may be done depending on the problems that you get from cirrhosis. Common problems include liver-related kidney failure (hepatorenal syndrome). Follow these instructions at home:   Take medicines only as told by your health care provider. Do not use medicines that are toxic to your liver. Ask your health care provider before taking any new medicines, including over-the-counter medicines.  Rest as needed.  Eat a well-balanced diet. Ask your health care provider or dietitian for more information.  Limit your salt or water intake, if your health care provider asks you to do this.  Do not drink alcohol. This is especially important if you are taking acetaminophen.  Keep all follow-up visits as told by your health care provider. This is important. Contact a health care provider if you:  Have fatigue or weakness that is getting worse.  Develop swelling of the hands, feet, legs, or face.  Have a fever.  Develop loss of appetite.  Have nausea or vomiting.  Develop jaundice.  Develop easy bruising or bleeding. Get help right away if you:  Vomit bright red blood or a material that looks like coffee grounds.  Have blood in your stools.  Notice that your stools appear black and tarry.  Become confused.  Have chest pain or trouble breathing. Summary  Cirrhosis is chronic liver injury. Liver damage cannot be reversed. Common causes are hepatitis C and long-term alcohol abuse.  Tests used to  diagnose cirrhosis include blood tests, imaging tests, and liver biopsy.  Treatment for this condition involves treating the underlying cause. Avoid alcohol, drugs, salt, and medicines that may damage your liver.  Contact your health care provider if you develop ascites, edema, jaundice, fever, nausea or vomiting, easy bruising or bleeding, or worsening fatigue. This information is not intended to replace advice given to you by your health care provider. Make sure you discuss any questions you have with your health care provider. Document Revised: 07/01/2018 Document Reviewed: 01/29/2017 Elsevier Patient Education  2020 [place].   Liver Failure  The liver is a large organ in the upper right-hand side of the abdomen. It is involved in many important functions, including storing energy, producing fluids that the body needs, and removing harmful substances from the bloodstream. Liver failure is a condition in which the liver loses its ability to function due to injury or disease. Liver failure can develop quickly, over days or weeks (acute liver failure). It can also develop gradually, over months or years (chronic liver failure). What are the causes? Common causes of acute liver failure include:  Acetaminophen overdose.  Reaction to certain medicines.  Liver infection (viral hepatitis). Common causes of chronic liver failure include:  Viral hepatitis.  Liver disease.  Alcohol abuse. What are the signs or symptoms? Most people do not have  symptoms in the early stages of liver failure. If early symptoms do develop, they may include:  Loss of appetite.  Nausea and vomiting.  Weakness and tiredness (fatigue).  Weight loss without trying.  Diarrhea. As the condition worsens, symptoms of this condition may include:  Yellowing of the skin and the whites of the eyes (jaundice).  Bruising and bleeding easily.  Itchy skin (pruritus).  Fluid buildup in the abdomen  (ascites).  Personality and mood changes.  Confusion and sleepiness. How is this diagnosed? This condition is diagnosed based on your symptoms, your medical history, and a physical exam. You may have tests, including:  Blood tests.  CT scan.  MRI.  Ultrasound.  Removal of a small amount of liver tissue to be examined under a microscope (biopsy). How is this treated? Treatment for this condition depends on the cause and severity of symptoms. Treatment for chronic liver failure may include:  Medicines to help reduce symptoms.  Lifestyle changes, such as limiting salt and animal proteins in your diet. Foods that contain animal proteins include red meat, fish, and dairy products. Acute or advanced (end stage) liver failure may require hospitalization. Treatment may include:  Giving antibiotic medicine.  Giving IV fluids that contain sugar (glucose) and minerals (electrolytes).  Flushing out toxic substances from the body using medicine (lactulose) or a cleansing procedure (enema).  Adding certain amounts of the liquid part of blood (plasma) to your bloodstream (receiving a transfusion).  Using an artificial kidney to filter your blood (hemodialysis) if you have renal failure.  Using breathing support and a breathing tube (respirator).  Having a liver transplant. This is a surgery to replace your liver with another person's liver (donor liver). This may be the best option if your liver has completely stopped functioning. Follow these instructions at home: Eating and drinking  Follow instructions from your health care provider about eating and drinking restrictions. This may include: ? Limiting the amount of animal protein that you eat. ? Increasing the amount of plant-based protein that you eat. Foods that contain plant-based proteins include whole grains, nuts, and vegetables. ? Taking vitamin supplements. ? Limiting the amount of salt that you eat. Lifestyle   Do not  drink alcohol.  Do not use any products that contain nicotine or tobacco, such as cigarettes, e-cigarettes, and chewing tobacco. If you need help quitting, ask your health care provider.  Exercise regularly, as told by your health care provider. General instructions  Take over-the-counter and prescription medicines only as told by your health care provider.  Follow instructions from your health care provider about maintaining your vaccinations, especially vaccinations against hepatitis A and B.  Keep all follow-up visits as told by your health care provider. This is important. Contact a health care provider if:  You have symptoms that get worse.  You lose a lot of weight without trying.  You have a fever or chills. Get help right away if:  You become confused or very sleepy.  You cannot take care of yourself or be taken care of at home.  You are not urinating.  You have difficulty breathing.  You vomit blood. Summary  Liver failure is a condition in which the liver loses its ability to function due to injury or disease.  Treatment for this condition depends on the cause and severity of symptoms.  Take over-the-counter and prescription medicines only as told by your health care provider.  Contact a health care provider if you have symptoms that get worse.  Keep all follow-up visits as told by your health care provider. This is important. This information is not intended to replace advice given to you by your health care provider. Make sure you discuss any questions you have with your health care provider. Document Revised: 08/20/2017 Document Reviewed: 08/20/2017 Elsevier Patient Education  [place].

## 2019-06-05 ENCOUNTER — Emergency Department: Payer: PRIVATE HEALTH INSURANCE

## 2019-06-05 ENCOUNTER — Emergency Department
Admission: EM | Admit: 2019-06-05 | Discharge: 2019-06-05 | Disposition: A | Discharge status: Home / Self Care | Payer: PRIVATE HEALTH INSURANCE | Source: Home / Self Care | Attending: Emergency Medicine | Admitting: Emergency Medicine

## 2019-06-05 ENCOUNTER — Encounter: Payer: Self-pay | Admitting: Emergency Medicine

## 2019-06-05 ENCOUNTER — Other Ambulatory Visit: Payer: Self-pay

## 2019-06-05 ENCOUNTER — Observation Stay
Admission: EM | Admit: 2019-06-05 | Discharge: 2019-06-06 | Disposition: A | Discharge status: Home / Self Care | Payer: PRIVATE HEALTH INSURANCE | Attending: Internal Medicine | Admitting: Internal Medicine

## 2019-06-05 DIAGNOSIS — W010XXA Fall on same level from slipping, tripping and stumbling without subsequent striking against object, initial encounter: Secondary | ICD-10-CM | POA: Diagnosis not present

## 2019-06-05 DIAGNOSIS — R296 Repeated falls: Secondary | ICD-10-CM

## 2019-06-05 DIAGNOSIS — S0512XA Contusion of eyeball and orbital tissues, left eye, initial encounter: Secondary | ICD-10-CM | POA: Diagnosis not present

## 2019-06-05 DIAGNOSIS — S01112A Laceration without foreign body of left eyelid and periocular area, initial encounter: Secondary | ICD-10-CM | POA: Insufficient documentation

## 2019-06-05 DIAGNOSIS — I6782 Cerebral ischemia: Secondary | ICD-10-CM | POA: Insufficient documentation

## 2019-06-05 DIAGNOSIS — Z888 Allergy status to other drugs, medicaments and biological substances status: Secondary | ICD-10-CM | POA: Insufficient documentation

## 2019-06-05 DIAGNOSIS — Z880 Allergy status to penicillin: Secondary | ICD-10-CM | POA: Diagnosis not present

## 2019-06-05 DIAGNOSIS — F419 Anxiety disorder, unspecified: Secondary | ICD-10-CM | POA: Insufficient documentation

## 2019-06-05 DIAGNOSIS — Z87891 Personal history of nicotine dependence: Secondary | ICD-10-CM | POA: Insufficient documentation

## 2019-06-05 DIAGNOSIS — S0181XA Laceration without foreign body of other part of head, initial encounter: Secondary | ICD-10-CM

## 2019-06-05 DIAGNOSIS — Z91040 Latex allergy status: Secondary | ICD-10-CM | POA: Insufficient documentation

## 2019-06-05 DIAGNOSIS — K7290 Hepatic failure, unspecified without coma: Secondary | ICD-10-CM | POA: Diagnosis present

## 2019-06-05 DIAGNOSIS — K7031 Alcoholic cirrhosis of liver with ascites: Secondary | ICD-10-CM | POA: Diagnosis not present

## 2019-06-05 DIAGNOSIS — K7040 Alcoholic hepatic failure without coma: Secondary | ICD-10-CM | POA: Diagnosis not present

## 2019-06-05 DIAGNOSIS — Z885 Allergy status to narcotic agent status: Secondary | ICD-10-CM | POA: Diagnosis not present

## 2019-06-05 DIAGNOSIS — Z20822 Contact with and (suspected) exposure to covid-19: Secondary | ICD-10-CM | POA: Insufficient documentation

## 2019-06-05 DIAGNOSIS — Z881 Allergy status to other antibiotic agents status: Secondary | ICD-10-CM | POA: Diagnosis not present

## 2019-06-05 DIAGNOSIS — Y999 Unspecified external cause status: Secondary | ICD-10-CM | POA: Insufficient documentation

## 2019-06-05 DIAGNOSIS — Z79899 Other long term (current) drug therapy: Secondary | ICD-10-CM | POA: Insufficient documentation

## 2019-06-05 DIAGNOSIS — K7682 Hepatic encephalopathy: Secondary | ICD-10-CM | POA: Diagnosis present

## 2019-06-05 DIAGNOSIS — Y929 Unspecified place or not applicable: Secondary | ICD-10-CM | POA: Insufficient documentation

## 2019-06-05 DIAGNOSIS — S0083XA Contusion of other part of head, initial encounter: Secondary | ICD-10-CM

## 2019-06-05 DIAGNOSIS — Z882 Allergy status to sulfonamides status: Secondary | ICD-10-CM | POA: Insufficient documentation

## 2019-06-05 DIAGNOSIS — Y939 Activity, unspecified: Secondary | ICD-10-CM | POA: Insufficient documentation

## 2019-06-05 DIAGNOSIS — W0110XA Fall on same level from slipping, tripping and stumbling with subsequent striking against unspecified object, initial encounter: Secondary | ICD-10-CM | POA: Insufficient documentation

## 2019-06-05 LAB — COMPREHENSIVE METABOLIC PANEL
ALT: 34 U/L (ref 0–44)
AST: 66 U/L — ABNORMAL HIGH (ref 15–41)
Albumin: 2.9 g/dL — ABNORMAL LOW (ref 3.5–5.0)
Alkaline Phosphatase: 102 U/L (ref 38–126)
Anion gap: 9 (ref 5–15)
BUN: 11 mg/dL (ref 6–20)
CO2: 25 mmol/L (ref 22–32)
Calcium: 8.6 mg/dL — ABNORMAL LOW (ref 8.9–10.3)
Chloride: 106 mmol/L (ref 98–111)
Creatinine, Ser: 0.55 mg/dL (ref 0.44–1.00)
GFR calc Af Amer: >60 mL/min (ref >60)
GFR calc non Af Amer: >60 mL/min (ref >60)
Glucose, Bld: 117 mg/dL — ABNORMAL HIGH (ref 70–99)
Potassium: 3.9 mmol/L (ref 3.5–5.1)
Sodium: 140 mmol/L (ref 135–145)
Total Bilirubin: 4.6 mg/dL — ABNORMAL HIGH (ref 0.3–1.2)
Total Protein: 6.5 g/dL (ref 6.5–8.1)

## 2019-06-05 LAB — CBC
HCT: 33.3 % — ABNORMAL LOW (ref 36.0–46.0)
Hemoglobin: 11.1 g/dL — ABNORMAL LOW (ref 12.0–15.0)
MCH: 35.4 pg — ABNORMAL HIGH (ref 26.0–34.0)
MCHC: 33.3 g/dL (ref 30.0–36.0)
MCV: 106.1 fL — ABNORMAL HIGH (ref 80.0–100.0)
Platelets: 113 10*3/uL — ABNORMAL LOW (ref 150–400)
RBC: 3.14 MIL/uL — ABNORMAL LOW (ref 3.87–5.11)
RDW: 13.3 % (ref 11.5–15.5)
WBC: 6.2 10*3/uL (ref 4.0–10.5)
nRBC: 0 % (ref 0.0–0.2)

## 2019-06-05 LAB — AMMONIA: Ammonia: 77 umol/L — ABNORMAL HIGH (ref 9–35)

## 2019-06-05 LAB — PROTIME-INR
INR: 1.6 — ABNORMAL HIGH (ref 0.8–1.2)
Prothrombin Time: 18.5 seconds — ABNORMAL HIGH (ref 11.4–15.2)

## 2019-06-05 MED ORDER — TRAMADOL HCL 50 MG PO TABS
50.0000 mg | ORAL_TABLET | Freq: Once | ORAL | Status: DC
  Filled 2019-06-05: qty 1

## 2019-06-05 MED ORDER — LACTULOSE 10 GM/15ML PO SOLN
30.0000 g | Freq: Once | ORAL | Status: AC
  Administered 2019-06-06: 30 g via ORAL
  Filled 2019-06-05: qty 60

## 2019-06-05 MED ORDER — OXYCODONE-ACETAMINOPHEN 7.5-325 MG PO TABS: 1.0000 | ORAL_TABLET | Freq: Four times a day (QID) | ORAL | 0 refills | Status: DC | PRN

## 2019-06-05 MED ORDER — TETANUS-DIPHTH-ACELL PERTUSSIS 5-2.5-18.5 LF-MCG/0.5 IM SUSP
0.5000 mL | Freq: Once | INTRAMUSCULAR | Status: AC
  Administered 2019-06-05: 0.5 mL via INTRAMUSCULAR
  Filled 2019-06-05: qty 0.5

## 2019-06-05 MED ORDER — OXYCODONE-ACETAMINOPHEN 5-325 MG PO TABS
1.0000 | ORAL_TABLET | Freq: Once | ORAL | Status: DC
  Filled 2019-06-05: qty 1

## 2019-06-05 NOTE — ED Notes (Signed)
First Nurse Note: Pt fell hitting her head. Pt has laceration on left side of forehead. Pt is in NAD.

## 2019-06-05 NOTE — ED Notes (Signed)
Pt refused tramadol pain medication. Pt stated she was allergic to tramadol and stated she used "heat, ice, and bear with it" for pain.

## 2019-06-05 NOTE — ED Notes (Addendum)
See triage note. Pt with cut over L eye; bleeding currently controlled. Pt denies having lost consciousness. Pt A&Ox4. Pt in good spirits. Bed locked low. Rail up. Pt unsure of when last tetanus shot was.

## 2019-06-05 NOTE — ED Provider Notes (Addendum)
[facility] Emergency Department Provider Note  Time seen: 10:25 PM  I have reviewed the triage vital signs and the nursing notes.   HISTORY  Chief Complaint Head injury  HPI Lisa Dennis is a 54 y.o. female with a past medical history of alcohol abuse, anxiety, cirrhosis presents to the emergency department for bleeding head wound.  Patient was seen in the emergency department earlier today after a mechanical fall at home where she hit her head causing a hematoma just above the left eyebrow with a small laceration that was repaired with Dermabond.  Patient states the laceration was still bleeding at home so she presented back to the emergency department.  Patient states continued headache.  Denies any vomiting.  Does admit to alcohol use.    Past Medical History:  Diagnosis Date  . Alcohol abuse   . Anxiety   . Cirrhosis [facility])     Patient Active Problem List   Diagnosis Date Noted  . 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 Rectal bleeding   . Acute GI bleeding 04/03/2019  . Alcohol withdrawal syndrome without complication ([place]) 123XX123  . Bleeding behind the abdominal cavity   . SBP (spontaneous bacterial peritonitis) ([place])   . Intra-abdominal varices   . Hemorrhage, intraperitoneal 11/15/2018  . Alcoholic cirrhosis of liver with ascites ([place]) 11/13/2018  . Alcoholic intoxication with complication ([place]) [id number]  . Decompensated hepatic cirrhosis ([place]) 08/27/2018  . GI bleeding 06/08/2018  . Alcoholic cirrhosis of liver without ascites ([place])   . Hematemesis without nausea     Past Surgical History:  Procedure Laterality Date  . COLONOSCOPY WITH PROPOFOL N/A 04/06/2019   Procedure: COLONOSCOPY WITH PROPOFOL;  Surgeon: Lin Landsman, MD;  Location: [facility] ENDOSCOPY;  Service: Gastroenterology;  Laterality: N/A;  . ESOPHAGOGASTRODUODENOSCOPY (EGD) WITH PROPOFOL N/A 06/08/2018   Procedure:  ESOPHAGOGASTRODUODENOSCOPY (EGD) WITH PROPOFOL;  Surgeon: Lucilla Lame, MD;  Location: [facility] ENDOSCOPY;  Service: Endoscopy;  Laterality: N/A;  . ESOPHAGOGASTRODUODENOSCOPY (EGD) WITH PROPOFOL N/A 11/12/2018   Procedure: ESOPHAGOGASTRODUODENOSCOPY (EGD) WITH PROPOFOL;  Surgeon: Jonathon Bellows, MD;  Location: [facility] ENDOSCOPY;  Service: Gastroenterology;  Laterality: N/A;    Prior to Admission medications   Medication Sig Start Date End Date Taking? Authorizing Provider  ciprofloxacin (CIPRO) 500 MG tablet Take 500 mg by mouth daily. 11/27/18   [provider]  folic acid (FOLVITE) 1 MG tablet Take 1 tablet (1 mg total) by mouth daily. 01/30/19   Carrie Mew, MD  lactulose (CHRONULAC) 10 GM/15ML solution Take 15 mLs (10 g total) by mouth 3 (three) times daily. Hold for more than 2 loose bowel movements. 05/29/19   Swayze, Ava, DO  LORazepam (ATIVAN) 0.5 MG tablet Take 0.5 mg by mouth daily as needed. 05/20/19   [provider]  Multiple Vitamin (MULTIVITAMIN WITH MINERALS) TABS tablet Take 1 tablet by mouth daily. 05/29/19   Swayze, Ava, DO  nadolol (CORGARD) 20 MG tablet Take 20 mg by mouth daily.    [provider]  oxyCODONE-acetaminophen (PERCOCET) 7.5-325 MG tablet Take 1 tablet by mouth every 6 (six) hours as needed. 06/05/19   Sable Feil, PA-C  pantoprazole (PROTONIX) 40 MG tablet Take 40 mg by mouth daily.    [provider]  thiamine 100 MG tablet Take 1 tablet (100 mg total) by mouth daily. 05/29/19   Swayze, Ava, DO    Allergies  Allergen Reactions  . Benadryl [Diphenhydramine] Other (See Comments)    Altered Mental Status  .  Venlafaxine Hcl Er Itching  . Penicillins Hives and Other (See Comments)    Did it involve swelling of the face/tongue/throat, SOB, or low BP? No Did it involve sudden or severe rash/hives, skin peeling, or any reaction on the inside of your mouth or nose? Unknown Did you need to seek medical attention at a hospital or doctor's  office? Unknown When did it last happen?unknown If all above answers are "NO", may proceed with cephalosporin use.  **patient tolerates ceftriaxone - Aug 2020   . Clindamycin/Lincomycin Swelling  . Latex Hives  . Morphine And Related Hives  . Sulfa Antibiotics Hives  . Tetracyclines & Related Hives    No family history on file.  Social History Social History   Tobacco Use  . Smoking status: Former Research scientist (life sciences)  . Smokeless tobacco: Never Used  Substance Use Topics  . Alcohol use: Yes    Alcohol/week: 10.0 standard drinks    Types: 10 Glasses of wine per week    Comment: 5 glasses yesterday  . Drug use: Never    Review of Systems Constitutional: Negative for loss of consciousness. Cardiovascular: Negative for chest pain. Respiratory: Negative for shortness of breath. Gastrointestinal: Negative for abdominal pain Musculoskeletal: Negative for musculoskeletal complaints Neurological: Mild headache All other ROS negative  ____________________________________________   PHYSICAL EXAM:  VITAL SIGNS: ED Triage Vitals  Enc Vitals Group     BP 06/05/19 2126 106/64     Pulse Rate 06/05/19 2126 79     Resp 06/05/19 2126 18     Temp 06/05/19 2126 98.5 F (36.9 C)     Temp Source 06/05/19 2126 Oral     SpO2 06/05/19 2126 97 %     Weight 06/05/19 2124 125 lb (56.7 kg)     Height 06/05/19 2124 5\' 2"  (1.575 m)     Head Circumference --      Peak Flow --      Pain Score 06/05/19 2124 2     Pain Loc --      Pain Edu? --      Excl. in [place]? --     Constitutional: Alert and oriented. Well appearing and in no distress. Eyes: Slight scleral icterus. ENT      Head: Hematoma above left eyebrow.      Mouth/Throat: Mucous membranes are moist. Cardiovascular: Normal rate, regular rhythm.  Respiratory: Normal respiratory effort without tachypnea nor retractions. Breath sounds are clear  Gastrointestinal: Soft and nontender. No distention.   Musculoskeletal: Nontender with normal  range of motion in all extremities. Neurologic:  Normal speech and language. No gross focal neurologic deficits Skin: Patient has a laceration repaired with Dermabond above the left eyebrow.  It is largely hemostatic with a very small area of oozing.  Patient has a small to moderate sized hematoma below the laceration. Psychiatric: Mood and affect are normal.  ____________________________________________  RADIOLOGY  No intracranial abnormality.  Hematoma to left forehead.  ____________________________________________   INITIAL IMPRESSION / ASSESSMENT AND PLAN / ED COURSE  Pertinent labs & imaging results that were available during my care of the patient were reviewed by me and considered in my medical decision making (see chart for details).   Patient presents emergency department for continued headache and mild oozing/bleeding from a head wound that was repaired earlier tonight.  Overall patient appears well, no acute distress.  Patient has a history of cirrhosis alcohol abuse admits to alcohol use.  Patient does have a moderate hematoma to left forehead with continued  headache.  I reviewed the patient's work-up from earlier tonight she had a CT maxillofacial that was negative.  We will obtain a CT scan of the head as a precaution to rule out ICH.  The very small area of wound that is oozing could be easily repaired with Dermabond.  We will check labs given the mild scleral icterus although patient states known cirrhosis of the liver follows up with hepatology.  CT scan shows an enlarged hematoma.  Patient's INR is 1.6 likely due to her liver dysfunction.  Elevated ammonia but not significantly elevated from prior results.  Patient wants to go home.  Repaired the small 1 cm open part of laceration using with Dermabond.  Lisa Dennis was evaluated in Emergency Department on 06/05/2019 for the symptoms described in the history of present illness. She was evaluated in the context of the  global COVID-19 pandemic, which necessitated consideration that the patient might be at risk for infection with the SARS-CoV-2 virus that causes COVID-19. Institutional protocols and algorithms that pertain to the evaluation of patients at risk for COVID-19 are in a state of rapid change based on information released by regulatory bodies including the CDC and federal and state organizations. These policies and algorithms were followed during the patient's care in the ED.  ____________________________________________   FINAL CLINICAL IMPRESSION(S) / ED DIAGNOSES  Fall Head injury   Harvest Dark, MD 06/08/19 2049    Harvest Dark, MD 06/18/19 2229

## 2019-06-05 NOTE — ED Triage Notes (Signed)
Pt slipped on wood floor and fell. Denies LOC. Hit head and has lac above L eye. Bleeding controlled at this time. Denies blood thinner use.

## 2019-06-05 NOTE — ED Triage Notes (Signed)
Pt was seen here earlier for a fall and came back in due to the laceration bleeding again. Pt denies repeat injury or any LOC. Pt is in NAD.

## 2019-06-05 NOTE — ED Provider Notes (Signed)
[facility] Emergency Department Provider Note   ____________________________________________   First MD Initiated Contact with Patient 06/05/19 1316     (approximate)  I have reviewed the triage vital signs and the nursing notes.   HISTORY  Chief Complaint Fall and Laceration    HPI Lisa Dennis is a 54 y.o. female patient presents with pain/edema or laceration to the left supraorbital.  Patient slipped on a wooden floor and fell striking her head.  Patient denies LOC.  Patient has a laceration above the left eye.  Bleeding is controlled with direct pressure.  Patient denies vision change or vertigo.  Patient is not taking a blood thinner.  Patient rates her pain as a 3/10.  Patient described pain as "sore".         Past Medical History:  Diagnosis Date  . Alcohol abuse   . Anxiety   . Cirrhosis [facility])     Patient Active Problem List   Diagnosis Date Noted  . 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 Rectal bleeding   . Acute GI bleeding 04/03/2019  . Alcohol withdrawal syndrome without complication ([place]) 123XX123  . Bleeding behind the abdominal cavity   . SBP (spontaneous bacterial peritonitis) ([employer])   . Intra-abdominal varices   . Hemorrhage, intraperitoneal 11/15/2018  . Alcoholic cirrhosis of liver with ascites ([place]) 11/13/2018  . Alcoholic intoxication with complication ([place]) [id number]  . Decompensated hepatic cirrhosis ([place]) 08/27/2018  . GI bleeding 06/08/2018  . Alcoholic cirrhosis of liver without ascites ([place])   . Hematemesis without nausea     Past Surgical History:  Procedure Laterality Date  . COLONOSCOPY WITH PROPOFOL N/A 04/06/2019   Procedure: COLONOSCOPY WITH PROPOFOL;  Surgeon: Lin Landsman, MD;  Location: [facility] ENDOSCOPY;  Service: Gastroenterology;  Laterality: N/A;  . ESOPHAGOGASTRODUODENOSCOPY (EGD) WITH PROPOFOL N/A 06/08/2018   Procedure: ESOPHAGOGASTRODUODENOSCOPY  (EGD) WITH PROPOFOL;  Surgeon: Lucilla Lame, MD;  Location: [facility] ENDOSCOPY;  Service: Endoscopy;  Laterality: N/A;  . ESOPHAGOGASTRODUODENOSCOPY (EGD) WITH PROPOFOL N/A 11/12/2018   Procedure: ESOPHAGOGASTRODUODENOSCOPY (EGD) WITH PROPOFOL;  Surgeon: Jonathon Bellows, MD;  Location: [facility] ENDOSCOPY;  Service: Gastroenterology;  Laterality: N/A;    Prior to Admission medications   Medication Sig Start Date End Date Taking? Authorizing Provider  ciprofloxacin (CIPRO) 500 MG tablet Take 500 mg by mouth daily. 11/27/18   [provider]  folic acid (FOLVITE) 1 MG tablet Take 1 tablet (1 mg total) by mouth daily. 01/30/19   Carrie Mew, MD  lactulose (CHRONULAC) 10 GM/15ML solution Take 15 mLs (10 g total) by mouth 3 (three) times daily. Hold for more than 2 loose bowel movements. 05/29/19   Swayze, Ava, DO  LORazepam (ATIVAN) 0.5 MG tablet Take 0.5 mg by mouth daily as needed. 05/20/19   [provider]  Multiple Vitamin (MULTIVITAMIN WITH MINERALS) TABS tablet Take 1 tablet by mouth daily. 05/29/19   Swayze, Ava, DO  nadolol (CORGARD) 20 MG tablet Take 20 mg by mouth daily.    [provider]  oxyCODONE-acetaminophen (PERCOCET) 7.5-325 MG tablet Take 1 tablet by mouth every 6 (six) hours as needed. 06/05/19   Sable Feil, PA-C  pantoprazole (PROTONIX) 40 MG tablet Take 40 mg by mouth daily.    [provider]  thiamine 100 MG tablet Take 1 tablet (100 mg total) by mouth daily. 05/29/19   Swayze, Ava, DO    Allergies Benadryl [diphenhydramine], Venlafaxine hcl er, Penicillins, Clindamycin/lincomycin, Latex, Morphine and related, Sulfa antibiotics,  and Tetracyclines & related  History reviewed. No pertinent family history.  Social History Social History   Tobacco Use  . Smoking status: Former Research scientist (life sciences)  . Smokeless tobacco: Never Used  Substance Use Topics  . Alcohol use: Yes    Alcohol/week: 10.0 standard drinks    Types: 10 Glasses of wine per week    Comment: 5  glasses yesterday  . Drug use: Never    Review of Systems Constitutional: No fever/chills Eyes: No visual changes. ENT: No sore throat. Cardiovascular: Denies chest pain. Respiratory: Denies shortness of breath. Gastrointestinal: No abdominal pain.  No nausea, no vomiting.  No diarrhea.  No constipation. Genitourinary: Negative for dysuria. Musculoskeletal: Negative for back pain. Skin: Negative for rash.  Left supraorbital laceration. Neurological: Negative for headaches, focal weakness or numbness. Psychiatric:  Anxiety and alcohol abuse. Allergic/Immunilogical: See allergy list. ____________________________________________   PHYSICAL EXAM:  VITAL SIGNS: ED Triage Vitals  Enc Vitals Group     BP 06/05/19 1230 118/77     Pulse Rate 06/05/19 1230 76     Resp 06/05/19 1230 16     Temp 06/05/19 1230 97.6 F (36.4 C)     Temp Source 06/05/19 1230 Oral     SpO2 06/05/19 1230 98 %     Weight --      Height --      Head Circumference --      Peak Flow --      Pain Score 06/05/19 1234 3     Pain Loc --      Pain Edu? --      Excl. in [place]? --    Constitutional: Alert and oriented. Well appearing and in no acute distress. Eyes: Conjunctivae are normal. PERRL. EOMI. Head: Atraumatic. Nose: No congestion/rhinnorhea. Mouth/Throat: Mucous membranes are moist.  Oropharynx non-erythematous. Neck: No stridor.  No cervical spine tenderness to palpation. Hematological/Lymphatic/Immunilogical: No cervical lymphadenopathy. Cardiovascular: Normal rate, regular rhythm. Grossly normal heart sounds.  Good peripheral circulation. Respiratory: Normal respiratory effort.  No retractions. Lungs CTAB. Gastrointestinal: Soft and nontender. No distention. No abdominal bruits. No CVA tenderness. Genitourinary: Deferred Musculoskeletal: No lower extremity tenderness nor edema.  No joint effusions. Neurologic:  Normal speech and language. No gross focal neurologic deficits are appreciated. No gait  instability. Skin:  Skin is warm, dry and intact. No rash noted.  2.5 cm laceration left supraorbital area. Psychiatric: Mood and affect are normal. Speech and behavior are normal.  ____________________________________________   LABS (all labs ordered are listed, but only abnormal results are displayed)  Labs Reviewed - No data to display ____________________________________________  EKG   ____________________________________________  RADIOLOGY  ED MD interpretation:    Official radiology report(s): CT Maxillofacial Wo Contrast  Result Date: 06/05/2019 CLINICAL DATA:  Facial trauma, slip and fall, laceration above left eye EXAM: CT MAXILLOFACIAL WITHOUT CONTRAST TECHNIQUE: Multidetector CT imaging of the maxillofacial structures was performed. Multiplanar CT image reconstructions were also generated. COMPARISON:  None. FINDINGS: Osseous: No fracture or mandibular dislocation. No destructive process. Orbits: Negative. No traumatic or inflammatory finding. Sinuses: Clear. Soft tissues: Soft tissue laceration and hematoma over the left brow. Limited intracranial: No significant or unexpected finding. IMPRESSION: 1.  No displaced fracture or dislocation of the facial bones. 2.  Soft tissue laceration and hematoma over the left brow. Electronically Signed   By: Eddie Candle M.D.   On: 06/05/2019 14:43    ____________________________________________   PROCEDURES  Procedure(s) performed (including Critical Care):  Marland KitchenMarland KitchenLaceration Repair  Date/Time: 06/05/2019 4:43  PM Performed by: Sable Feil, PA-C Authorized by: Sable Feil, PA-C   Consent:    Consent obtained:  Verbal   Consent given by:  Patient   Risks discussed:  Infection, pain, poor cosmetic result and need for additional repair Anesthesia (see MAR for exact dosages):    Anesthesia method:  None Laceration details:    Location:  Face   Face location:  L upper eyelid   Extent:  Partial thickness   Length (cm):   2.5   Depth (mm):  2 Repair type:    Repair type:  Simple Pre-procedure details:    Preparation:  Patient was prepped and draped in usual sterile fashion and imaging obtained to evaluate for foreign bodies Exploration:    Contaminated: no   Treatment:    Area cleansed with:  Betadine and saline   Amount of cleaning:  Standard Skin repair:    Repair method:  Tissue adhesive Approximation:    Approximation:  Close Post-procedure details:    Dressing:  Adhesive bandage   Patient tolerance of procedure:  Tolerated well, no immediate complications     ____________________________________________   INITIAL IMPRESSION / ASSESSMENT AND PLAN / ED COURSE  As part of my medical decision making, I reviewed the following data within the [place]     Patient presents with laceration to the left supraorbital area secondary to a slip and fall.  No LOC.  See procedure note for wound closure.  Patient given discharge care instruction advised take medication as needed.  Return right ED if wound reopens for healing is complete.    Kaliope Stair was evaluated in Emergency Department on 06/05/2019 for the symptoms described in the history of present illness. She was evaluated in the context of the global COVID-19 pandemic, which necessitated consideration that the patient might be at risk for infection with the SARS-CoV-2 virus that causes COVID-19. Institutional protocols and algorithms that pertain to the evaluation of patients at risk for COVID-19 are in a state of rapid change based on information released by regulatory bodies including the CDC and federal and state organizations. These policies and algorithms were followed during the patient's care in the ED.       ____________________________________________   FINAL CLINICAL IMPRESSION(S) / ED DIAGNOSES  Final diagnoses:  Contusion of face, initial encounter  Facial laceration, initial encounter     ED  Discharge Orders         Ordered    oxyCODONE-acetaminophen (PERCOCET) 7.5-325 MG tablet  Every 6 hours PRN     06/05/19 1457           Note:  This document was prepared using Dragon voice recognition software and may include unintentional dictation errors.    Sable Feil, PA-C 06/05/19 1645    Vanessa [place], MD 06/06/19 [id number]

## 2019-06-05 NOTE — ED Notes (Signed)
Lab called for venipuncture assist.

## 2019-06-05 NOTE — Discharge Instructions (Signed)
Follow discharge care instruction take medication as needed for headache/pain.

## 2019-06-06 ENCOUNTER — Encounter: Payer: Self-pay | Admitting: Family Medicine

## 2019-06-06 DIAGNOSIS — K7290 Hepatic failure, unspecified without coma: Secondary | ICD-10-CM | POA: Diagnosis not present

## 2019-06-06 LAB — COMPREHENSIVE METABOLIC PANEL
ALT: 31 U/L (ref 0–44)
AST: 58 U/L — ABNORMAL HIGH (ref 15–41)
Albumin: 2.8 g/dL — ABNORMAL LOW (ref 3.5–5.0)
Alkaline Phosphatase: 91 U/L (ref 38–126)
Anion gap: 8 (ref 5–15)
BUN: 10 mg/dL (ref 6–20)
CO2: 28 mmol/L (ref 22–32)
Calcium: 8.6 mg/dL — ABNORMAL LOW (ref 8.9–10.3)
Chloride: 106 mmol/L (ref 98–111)
Creatinine, Ser: 0.49 mg/dL (ref 0.44–1.00)
GFR calc Af Amer: >60 mL/min (ref >60)
GFR calc non Af Amer: >60 mL/min (ref >60)
Glucose, Bld: 121 mg/dL — ABNORMAL HIGH (ref 70–99)
Potassium: 3.9 mmol/L (ref 3.5–5.1)
Sodium: 142 mmol/L (ref 135–145)
Total Bilirubin: 4.1 mg/dL — ABNORMAL HIGH (ref 0.3–1.2)
Total Protein: 6.2 g/dL — ABNORMAL LOW (ref 6.5–8.1)

## 2019-06-06 LAB — CBC
HCT: 33.8 % — ABNORMAL LOW (ref 36.0–46.0)
Hemoglobin: 10.6 g/dL — ABNORMAL LOW (ref 12.0–15.0)
MCH: 34.3 pg — ABNORMAL HIGH (ref 26.0–34.0)
MCHC: 31.4 g/dL (ref 30.0–36.0)
MCV: 109.4 fL — ABNORMAL HIGH (ref 80.0–100.0)
Platelets: 93 10*3/uL — ABNORMAL LOW (ref 150–400)
RBC: 3.09 MIL/uL — ABNORMAL LOW (ref 3.87–5.11)
RDW: 13.5 % (ref 11.5–15.5)
WBC: 4.4 10*3/uL (ref 4.0–10.5)
nRBC: 0.5 % — ABNORMAL HIGH (ref 0.0–0.2)

## 2019-06-06 LAB — SARS CORONAVIRUS 2 (TAT 6-24 HRS): SARS Coronavirus 2: NEGATIVE

## 2019-06-06 LAB — ETHANOL: Alcohol, Ethyl (B): 181 mg/dL — ABNORMAL HIGH (ref <10)

## 2019-06-06 LAB — AMMONIA: Ammonia: 26 umol/L (ref 9–35)

## 2019-06-06 MED ORDER — MAGNESIUM HYDROXIDE 400 MG/5ML PO SUSP
30.0000 mL | Freq: Every day | ORAL | Status: DC | PRN
  Filled 2019-06-06: qty 30

## 2019-06-06 MED ORDER — LACTULOSE 10 GM/15ML PO SOLN: 20.0000 g | Freq: Three times a day (TID) | ORAL | 0 refills | Status: DC

## 2019-06-06 MED ORDER — NADOLOL 20 MG PO TABS
20.0000 mg | ORAL_TABLET | Freq: Every day | ORAL | Status: DC
  Filled 2019-06-06: qty 1

## 2019-06-06 MED ORDER — PANTOPRAZOLE SODIUM 40 MG PO TBEC: 40.0000 mg | DELAYED_RELEASE_TABLET | Freq: Every day | ORAL | Status: DC

## 2019-06-06 MED ORDER — ONDANSETRON HCL 4 MG/2ML IJ SOLN: 4.0000 mg | Freq: Four times a day (QID) | INTRAMUSCULAR | Status: DC | PRN

## 2019-06-06 MED ORDER — LACTULOSE 10 GM/15ML PO SOLN: 30.0000 g | Freq: Three times a day (TID) | ORAL | Status: DC

## 2019-06-06 MED ORDER — FOLIC ACID 1 MG PO TABS: 1.0000 mg | ORAL_TABLET | Freq: Every day | ORAL | Status: DC

## 2019-06-06 MED ORDER — LORAZEPAM 0.5 MG PO TABS: 0.5000 mg | ORAL_TABLET | Freq: Four times a day (QID) | ORAL | Status: DC | PRN

## 2019-06-06 MED ORDER — LORAZEPAM 2 MG/ML IJ SOLN: 1.0000 mg | INTRAMUSCULAR | Status: DC | PRN

## 2019-06-06 MED ORDER — ACETAMINOPHEN 650 MG RE SUPP: 650.0000 mg | Freq: Four times a day (QID) | RECTAL | Status: DC | PRN

## 2019-06-06 MED ORDER — ENOXAPARIN SODIUM 40 MG/0.4ML ~~LOC~~ SOLN: 40.0000 mg | SUBCUTANEOUS | Status: DC

## 2019-06-06 MED ORDER — LORAZEPAM BOLUS VIA INFUSION: 1.0000 mg | INTRAVENOUS | Status: DC | PRN

## 2019-06-06 MED ORDER — THIAMINE HCL 100 MG PO TABS: 100.0000 mg | ORAL_TABLET | Freq: Every day | ORAL | Status: DC

## 2019-06-06 MED ORDER — TRAZODONE HCL 50 MG PO TABS: 25.0000 mg | ORAL_TABLET | Freq: Every evening | ORAL | Status: DC | PRN

## 2019-06-06 MED ORDER — ADULT MULTIVITAMIN W/MINERALS CH: 1.0000 | ORAL_TABLET | Freq: Every day | ORAL | Status: DC

## 2019-06-06 MED ORDER — ONDANSETRON HCL 4 MG PO TABS: 4.0000 mg | ORAL_TABLET | Freq: Four times a day (QID) | ORAL | Status: DC | PRN

## 2019-06-06 MED ORDER — SODIUM CHLORIDE 0.9 % IV SOLN: INTRAVENOUS | Status: DC

## 2019-06-06 MED ORDER — ACETAMINOPHEN 325 MG PO TABS: 650.0000 mg | ORAL_TABLET | Freq: Four times a day (QID) | ORAL | Status: DC | PRN

## 2019-06-06 NOTE — ED Notes (Signed)
Attempt to call report to floor. Per person who answered phone rn not available. Informed person that answered phone to please let RN know she has 15 minutes to review the chart and may call this rn if she has questions then this rn will send pt to floor per new policy guidelines.

## 2019-06-06 NOTE — Evaluation (Signed)
Physical Therapy Evaluation Patient Details Name: Lisa Dennis MRN: [id number] DOB: [birth year] Today's Date: 06/06/2019   History of Present Illness  Pt is a 54 y.o. female presenting to hospital 06/05/19 after slipping on wooden floor and fell striking head sustaining laceration above L eye.  Pt admitted with hepatic encephalopathy with alcoholic liver cirrhosis and subsequent recurrent  falls with L periorbital contusion and L forehead laceration.  PMH includes alcohol abuse, anxiety, cirrhosis, s/p TIPS 04/06/19, and SBP.  Clinical Impression  Prior to hospital admission, pt was independent; lives alone in multi-level home with steps to enter with railings.  Currently pt is independent with transfers and ambulation 160 feet no AD and also modified independent navigating 6 steps with railing.  Pt with impaired vision d/t swelling above L eye (pt unable to open L eyelid) but pt demonstrated appropriate modifications for safety with ambulation/functional mobility and also able to verbalize precautions d/t impaired vision.  No loss of balance noted during sessions activities.  Also discussed home modifications for fall prevention: pt verbalizing appropriate understanding.  No further acute PT needs identified; will discharge pt from PT in house and sign off.    Follow Up Recommendations No PT follow up    Equipment Recommendations  None recommended by PT    Recommendations for Other Services       Precautions / Restrictions Precautions Precautions: None Restrictions Weight Bearing Restrictions: No      Mobility  Bed Mobility               General bed mobility comments: Pt reports no difficulty with bed mobility: deferred  Transfers Overall transfer level: Independent Equipment used: None             General transfer comment: No difficulties noted with transfers  Ambulation/Gait Ambulation/Gait assistance: Independent Gait Distance (Feet): 160 Feet Assistive  device: None Gait Pattern/deviations: WFL(Within Functional Limits) Gait velocity: slightly decreased   General Gait Details: steady and safe ambulation  Stairs Stairs: Yes Stairs assistance: Modified independent (Device/Increase time) Stair Management: One rail Right;Alternating pattern;Forwards Number of Stairs: 6 General stair comments: steady safe stairs navigation noted  Wheelchair Mobility    Modified Rankin (Stroke Patients Only)       Balance Overall balance assessment: Independent Sitting-balance support: No upper extremity supported;Feet supported Sitting balance-Leahy Scale: Normal Sitting balance - Comments: steady sitting reaching outside BOS   Standing balance support: No upper extremity supported;During functional activity Standing balance-Leahy Scale: Normal Standing balance comment: no loss of balance noted with sessions activities                             Pertinent Vitals/Pain Pain Assessment: Faces Faces Pain Scale: Hurts a little bit Pain Location: above L eye Pain Intervention(s): Limited activity within patient's tolerance;Monitored during session;Repositioned    Home Living Family/patient expects to be discharged to:: Private residence Living Arrangements: Alone Available Help at Discharge: Friend(s);Available PRN/intermittently Type of Home: House Home Access: Stairs to enter Entrance Stairs-Rails: Right;Left;Can reach both Entrance Stairs-Number of Steps: 10 Home Layout: One level Home Equipment: Grab bars - tub/shower;Grab bars - toilet;Crutches      Prior Function Level of Independence: Independent               Hand Dominance        Extremity/Trunk Assessment   Upper Extremity Assessment Upper Extremity Assessment: Overall WFL for tasks assessed    Lower Extremity Assessment Lower  Extremity Assessment: Overall WFL for tasks assessed    Cervical / Trunk Assessment Cervical / Trunk Assessment: Normal   Communication   Communication: No difficulties  Cognition Arousal/Alertness: Awake/alert Behavior During Therapy: WFL for tasks assessed/performed Overall Cognitive Status: Within Functional Limits for tasks assessed                                        General Comments General comments (skin integrity, edema, etc.): bruising and swelling above L eye.  Nursing cleared pt for participation in physical therapy.  Pt agreeable to PT session.    Exercises     Assessment/Plan    PT Assessment Patent does not need any further PT services  PT Problem List         PT Treatment Interventions      PT Goals (Current goals can be found in the Care Plan section)  Acute Rehab PT Goals Patient Stated Goal: to go home today PT Goal Formulation: With patient Time For Goal Achievement: 06/20/19 Potential to Achieve Goals: Good    Frequency     Barriers to discharge        Co-evaluation               AM-PAC PT "6 Clicks" Mobility  Outcome Measure Help needed turning from your back to your side while in a flat bed without using bedrails?: None Help needed moving from lying on your back to sitting on the side of a flat bed without using bedrails?: None Help needed moving to and from a bed to a chair (including a wheelchair)?: None Help needed standing up from a chair using your arms (e.g., wheelchair or bedside chair)?: None Help needed to walk in hospital room?: None Help needed climbing 3-5 steps with a railing? : None 6 Click Score: 24    End of Session Equipment Utilized During Treatment: Gait belt Activity Tolerance: Patient tolerated treatment well Patient left: in bed;with call bell/phone within reach Nurse Communication: Mobility status PT Visit Diagnosis: History of falling (Z91.81)    Time: [id number] PT Time Calculation (min) (ACUTE ONLY): 19 min   Charges:   PT Evaluation $PT Eval Low Complexity: 1 Low          Jermaine Tholl,  PT 06/06/19, 12:49 PM

## 2019-06-06 NOTE — H&P (Addendum)
[facility] NAME: Lisa Dennis    MR#:  [id number]  DATE OF BIRTH:  May 09, 1965  DATE OF ADMISSION:  06/05/2019  PRIMARY CARE PHYSICIAN: Rusty Aus, MD   REQUESTING/REFERRING PHYSICIAN: Harvest Dark, MD  CHIEF COMPLAINT:  Fall  HISTORY OF PRESENT ILLNESS:  Lisa Dennis  is a 54 y.o. female with a known history of alcohol abuse, alcoholic liver cirrhosis and anxiety, who presented to the emergency room with an onset of mechanical fall when she slipped and fell on hardwood floor with subsequent head injury hitting a cabinet edge and forehead hematoma just above the left eyebrow and a laceration that was repaired with Dermabond in the ER.  The patient continued to have headache.  No nausea or vomiting or abdominal pain.  No paresthesias or focal muscle weakness.  He denied any presyncope or syncope.  No dizziness or blurred vision or diplopia.  Conversation to the emergency room vital signs were within normal.  Labs revealed an ammonia level of 77 with an AST of 66 total bilirubin of 4.6 with otherwise normal CMP.  INR was 1.6 and PT 18.5.  Noncontrast head CT scan revealed no acute intracranial normalities.  Showed left supraorbital hematoma that is slightly increased in size from CT earlier today.  It showed generalized atrophy that is stable from prior exam but advanced for age with chronic small vessel ischemia.  The patient had a maxillofacial CT earlier today that showed soft tissue laceration and hematoma over the left eyebrow with no displaced fracture or dislocation of the facial bones.  The patient was given 30 g of p.o. Kayexalate.  He will be admitted to an observation medically monitored bed for further evaluation and management. PAST MEDICAL HISTORY:   Past Medical History:  Diagnosis Date  . Alcohol abuse   . Anxiety   . Cirrhosis ([place])   -Depression -GERD -Old cardiac murmur since she was a child  PAST SURGICAL  HISTORY:   Past Surgical History:  Procedure Laterality Date  . COLONOSCOPY WITH PROPOFOL N/A 04/06/2019   Procedure: COLONOSCOPY WITH PROPOFOL;  Surgeon: Lin Landsman, MD;  Location: [facility] ENDOSCOPY;  Service: Gastroenterology;  Laterality: N/A;  . ESOPHAGOGASTRODUODENOSCOPY (EGD) WITH PROPOFOL N/A 06/08/2018   Procedure: ESOPHAGOGASTRODUODENOSCOPY (EGD) WITH PROPOFOL;  Surgeon: Lucilla Lame, MD;  Location: [facility] ENDOSCOPY;  Service: Endoscopy;  Laterality: N/A;  . ESOPHAGOGASTRODUODENOSCOPY (EGD) WITH PROPOFOL N/A 11/12/2018   Procedure: ESOPHAGOGASTRODUODENOSCOPY (EGD) WITH PROPOFOL;  Surgeon: Jonathon Bellows, MD;  Location: [facility] ENDOSCOPY;  Service: Gastroenterology;  Laterality: N/A;    SOCIAL HISTORY:   Social History   Tobacco Use  . Smoking status: Former Research scientist (life sciences)  . Smokeless tobacco: Never Used  Substance Use Topics  . Alcohol use: Yes    Alcohol/week: 10.0 standard drinks    Types: 10 Glasses of wine per week    Comment: 5 glasses yesterday    FAMILY HISTORY:  No family history on file.  DRUG ALLERGIES:   Allergies  Allergen Reactions  . Benadryl [Diphenhydramine] Other (See Comments)    Altered Mental Status  . Venlafaxine Hcl Er Itching  . Penicillins Hives and Other (See Comments)    Did it involve swelling of the face/tongue/throat, SOB, or low BP? No Did it involve sudden or severe rash/hives, skin peeling, or any reaction on the inside of your mouth or nose? Unknown Did you need to seek medical attention at a hospital or doctor's office? Unknown When did it  last happen?unknown If all above answers are "NO", may proceed with cephalosporin use.  **patient tolerates ceftriaxone - Aug 2020   . Clindamycin/Lincomycin Swelling  . Latex Hives  . Morphine And Related Hives  . Sulfa Antibiotics Hives  . Tetracyclines & Related Hives    REVIEW OF SYSTEMS:   ROS As per history of present illness. All pertinent systems were reviewed above. Constitutional,   HEENT, cardiovascular, respiratory, GI, GU, musculoskeletal, neuro, psychiatric, endocrine,  integumentary and hematologic systems were reviewed and are otherwise  negative/unremarkable except for positive findings mentioned above in the HPI.   MEDICATIONS AT HOME:   Prior to Admission medications   Medication Sig Start Date End Date Taking? Authorizing Provider  ciprofloxacin (CIPRO) 500 MG tablet Take 500 mg by mouth daily. 11/27/18   [provider]  folic acid (FOLVITE) 1 MG tablet Take 1 tablet (1 mg total) by mouth daily. 01/30/19   Carrie Mew, MD  lactulose (CHRONULAC) 10 GM/15ML solution Take 15 mLs (10 g total) by mouth 3 (three) times daily. Hold for more than 2 loose bowel movements. 05/29/19   Swayze, Ava, DO  LORazepam (ATIVAN) 0.5 MG tablet Take 0.5 mg by mouth daily as needed. 05/20/19   [provider]  Multiple Vitamin (MULTIVITAMIN WITH MINERALS) TABS tablet Take 1 tablet by mouth daily. 05/29/19   Swayze, Ava, DO  nadolol (CORGARD) 20 MG tablet Take 20 mg by mouth daily.    [provider]  oxyCODONE-acetaminophen (PERCOCET) 7.5-325 MG tablet Take 1 tablet by mouth every 6 (six) hours as needed. 06/05/19   Sable Feil, PA-C  pantoprazole (PROTONIX) 40 MG tablet Take 40 mg by mouth daily.    [provider]  thiamine 100 MG tablet Take 1 tablet (100 mg total) by mouth daily. 05/29/19   Swayze, Ava, DO      VITAL SIGNS:  Blood pressure 106/64, pulse 79, temperature 98.5 F (36.9 C), temperature source Oral, resp. rate 18, height 5\' 2"  (1.575 m), weight 56.7 kg, SpO2 97 %.  PHYSICAL EXAMINATION:  Physical Exam  GENERAL:  54 y.o.-year-old patient lying in the bed with no acute distress.  EYES: Pupils equal, round, reactive to light and accommodation. No scleral icterus. Extraocular muscles intact.  HEENT: Head  normocephalic with left periorbital contusion with oropharynx and left forehead laceration just above the left eyebrow with  good hemostasis after Dermabond.  Nose: Clear.  NECK:  Supple, no jugular venous distention. No thyroid enlargement, no tenderness.  LUNGS: Normal breath sounds bilaterally, no wheezing, rales,rhonchi or crepitation. No use of accessory muscles of respiration.  CARDIOVASCULAR: Regular rate and rhythm, S1, S2 normal.  2/6 systolic murmur at the left lower sternal border with no rubs, or gallops.  ABDOMEN: Soft, nondistended, nontender. Bowel sounds present. No organomegaly or mass.  EXTREMITIES: No pedal edema, cyanosis, or clubbing.  NEUROLOGIC: Cranial nerves II through XII are intact. Muscle strength 5/5 in all extremities. Sensation intact. Gait not checked.  PSYCHIATRIC: The patient is alert and oriented x 3.  Normal affect and good eye contact. SKIN: No obvious rash, lesion, or ulcer.   LABORATORY PANEL:   CBC Recent Labs  Lab 06/05/19 2241  WBC 6.2  HGB 11.1*  HCT 33.3*  PLT 113*   ------------------------------------------------------------------------------------------------------------------  Chemistries  Recent Labs  Lab 06/05/19 2241  NA 140  K 3.9  CL 106  CO2 25  GLUCOSE 117*  BUN 11  CREATININE 0.55  CALCIUM 8.6*  AST 66*  ALT 34  ALKPHOS 102  BILITOT 4.6*   ------------------------------------------------------------------------------------------------------------------  Cardiac Enzymes No results for input(s): TROPONINI in the last 168 hours. ------------------------------------------------------------------------------------------------------------------  RADIOLOGY:  CT Head Wo Contrast  Result Date: 06/05/2019 CLINICAL DATA:  Fall with laceration. Laceration bleeding again. EXAM: CT HEAD WITHOUT CONTRAST TECHNIQUE: Contiguous axial images were obtained from the base of the skull through the vertex without intravenous contrast. COMPARISON:  Face CT earlier this day. Head CT 08/18/2018 FINDINGS: Brain: Generalized atrophy is stable from prior exam, but  age advanced. Moderate periventricular white matter hypodensity consistent with chronic small vessel ischemia. No intracranial hemorrhage, mass effect, or midline shift. No hydrocephalus. The basilar cisterns are patent. No evidence of territorial infarct or acute ischemia. No extra-axial or intracranial fluid collection. Vascular: No hyperdense vessel. Skull: No fracture or focal lesion. Sinuses/Orbits: Left supraorbital hematoma has slightly increased in size from CT earlier this day. Other: No new soft tissue abnormality. IMPRESSION: 1. No acute intracranial abnormality. No skull fracture. 2. Left supraorbital hematoma has slightly increased in size from CT earlier this day. 3. Generalized atrophy, stable from prior exam but advanced for age. Chronic small vessel ischemia. Electronically Signed   By: Keith Rake M.D.   On: 06/05/2019 23:13   CT Maxillofacial Wo Contrast  Result Date: 06/05/2019 CLINICAL DATA:  Facial trauma, slip and fall, laceration above left eye EXAM: CT MAXILLOFACIAL WITHOUT CONTRAST TECHNIQUE: Multidetector CT imaging of the maxillofacial structures was performed. Multiplanar CT image reconstructions were also generated. COMPARISON:  None. FINDINGS: Osseous: No fracture or mandibular dislocation. No destructive process. Orbits: Negative. No traumatic or inflammatory finding. Sinuses: Clear. Soft tissues: Soft tissue laceration and hematoma over the left brow. Limited intracranial: No significant or unexpected finding. IMPRESSION: 1.  No displaced fracture or dislocation of the facial bones. 2.  Soft tissue laceration and hematoma over the left brow. Electronically Signed   By: Eddie Candle M.D.   On: 06/05/2019 14:43      IMPRESSION AND PLAN:   1.  Hepatic encephalopathy with alcoholic liver cirrhosis and subsequent recurrent falls with left periorbital contusion and left forehead laceration status post Dermabond repair. -The patient will be admitted to an observation  medical monitored bed. -She will be placed on lactulose 30 g p.o. 3 times daily. -We will follow ammonia level daily. -We will monitor neuro checks every 4 hours for 24 hours. -Nadolol will be continued. -PT consult will be obtained to assess her ambulation.  2.  Alcohol abuse. -I counseled her for alcohol cessation and she will receive further counseling here. -She is interested in quitting and desires to have help. -She will be placed on banana bag daily and as needed IV Ativan for withdrawal.  3.  GERD. -PPI therapy will be continued.  4.  Anxiety with depression. -We will continue as needed Ativan and scheduled Effexor XR.  5.  DVT prophylaxis. -SCDs. -Medical prophylaxis currently contraindicated due to coagulopathy related to liver cirrhosis.   All the records are reviewed and case discussed with ED provider. The plan of care was discussed in details with the patient (and family). I answered all questions. The patient agreed to proceed with the above mentioned plan. Further management will depend upon hospital course.   CODE STATUS: Full code  TOTAL TIME TAKING CARE OF THIS PATIENT: 55 minutes.    Christel Mormon M.D on 06/06/2019 at 12:09 AM  [facility]   From 7 PM-7 AM, contact night-coverage [email or web address]  CC: Primary care physician; Rusty Aus, MD  Note: This dictation was prepared with Dragon dictation along with smaller phrase technology. Any transcriptional errors that result from this process are unintentional.

## 2019-06-06 NOTE — Progress Notes (Signed)
Pt ready to go home.  SWOT RN assisting Physiological scientist.  IV dc and VSS.  Pt refused morning meds from hospital.. dc instructions given.  Pt called for ride

## 2019-06-06 NOTE — ED Notes (Signed)
Pt up to restroom to urinate.  

## 2019-06-06 NOTE — Discharge Summary (Signed)
Physician Discharge Summary  [name] [id number] DOB: [birth year] DOA: 06/05/2019  PCP: Rusty Aus, MD  Admit date: 06/05/2019 Discharge date: 06/06/2019  Admitted From: Home Disposition:  Home  Recommendations for Outpatient Follow-up:  1. Follow up with PCP in 1-2 weeks 2. Please obtain CMP/CBC in one week 3. Please follow up on the following pending results:None  Home Health: No Equipment/Devices: None Discharge Condition: Stable CODE STATUS: Full Diet recommendation: Heart Healthy   Brief/Interim Summary: Lisa Dennis  is a 54 y.o. female with a known history of alcohol abuse, alcoholic liver cirrhosis and anxiety, who presented to the emergency room with an onset of mechanical fall when she slipped and fell on hardwood floor with subsequent head injury hitting a cabinet edge and forehead hematoma just above the left eyebrow and a laceration that was repaired with Dermabond in the ER.  The patient continued to have headache.  No nausea or vomiting or abdominal pain.  No paresthesias or focal muscle weakness.  He denied any presyncope or syncope.  No dizziness or blurred vision or diplopia.  CT head with no intracranial abnormalities.  Shows left supraorbital hematoma and is stable generalized atrophy with chronic small vessel ischemia.  Facial maxillary CT with soft tissue laceration and hematoma of the left eyebrow with no displaced fractures or dislocations of facial bones.  Patient developed marked ecchymosis and edema of left eyelid today.  Pain improved and patient wants to be discharged home as soon as possible and she will follow up with her primary care physician Wednesday.  She also does not want to have a PT evaluation done, stating that she already had a PT done 2 weeks ago.  She had someone coming to her home and helping her 5 days a week, doing simple exercises and walk with her. Her ammonia levels were elevated on admission which normalized with  extra dose of lactulose.  Her lactulose dose was increased to 20 mg 3 times a day on discharge.  Her T bili was elevated but improving.  We again counseled her against alcohol use.  Patient is also trying to get some help with the help of her PCP.  Her CIWA score remains 0.  She will continue rest of her home meds.  Discharge Diagnoses:  Active Problems:   Hepatic encephalopathy [facility])  Discharge Instructions  Discharge Instructions    Diet - low sodium heart healthy   Complete by: As directed    Discharge instructions   Complete by: As directed    It was pleasure taking care of you. You can use icing to help keep your swelling down. Please follow-up with your primary care physician according to your scheduled appointment next week.   Increase activity slowly   Complete by: As directed      Allergies as of 06/06/2019      Reactions   Benadryl [diphenhydramine] Other (See Comments)   Altered Mental Status   Venlafaxine Hcl Er Itching   Penicillins Hives, Other (See Comments)   Did it involve swelling of the face/tongue/throat, SOB, or low BP? No Did it involve sudden or severe rash/hives, skin peeling, or any reaction on the inside of your mouth or nose? Unknown Did you need to seek medical attention at a hospital or doctor's office? Unknown When did it last happen?unknown If all above answers are "NO", may proceed with cephalosporin use.  **patient tolerates ceftriaxone - Aug 2020   Clindamycin/lincomycin Swelling   Latex Hives  Morphine And Related Hives   Sulfa Antibiotics Hives   Tetracyclines & Related Hives      Medication List    STOP taking these medications   ciprofloxacin 500 MG tablet Commonly known as: CIPRO     TAKE these medications   folic acid 1 MG tablet Commonly known as: FOLVITE Take 1 tablet (1 mg total) by mouth daily.   lactulose 10 GM/15ML solution Commonly known as: CHRONULAC Take 30 mLs (20 g total) by mouth 3 (three) times daily. Hold  for more than 2 loose bowel movements. What changed: how much to take   LORazepam 0.5 MG tablet Commonly known as: ATIVAN Take 0.5 mg by mouth daily as needed.   multivitamin with minerals Tabs tablet Take 1 tablet by mouth daily.   nadolol 20 MG tablet Commonly known as: CORGARD Take 20 mg by mouth daily.   oxyCODONE-acetaminophen 7.5-325 MG tablet Commonly known as: Percocet Take 1 tablet by mouth every 6 (six) hours as needed.   pantoprazole 40 MG tablet Commonly known as: PROTONIX Take 40 mg by mouth daily.   thiamine 100 MG tablet Take 1 tablet (100 mg total) by mouth daily.       Allergies  Allergen Reactions  . Benadryl [Diphenhydramine] Other (See Comments)    Altered Mental Status  . Venlafaxine Hcl Er Itching  . Penicillins Hives and Other (See Comments)    Did it involve swelling of the face/tongue/throat, SOB, or low BP? No Did it involve sudden or severe rash/hives, skin peeling, or any reaction on the inside of your mouth or nose? Unknown Did you need to seek medical attention at a hospital or doctor's office? Unknown When did it last happen?unknown If all above answers are "NO", may proceed with cephalosporin use.  **patient tolerates ceftriaxone - Aug 2020   . Clindamycin/Lincomycin Swelling  . Latex Hives  . Morphine And Related Hives  . Sulfa Antibiotics Hives  . Tetracyclines & Related Hives    Consultations:  None  Procedures/Studies: CT Head Wo Contrast  Result Date: 06/05/2019 CLINICAL DATA:  Fall with laceration. Laceration bleeding again. EXAM: CT HEAD WITHOUT CONTRAST TECHNIQUE: Contiguous axial images were obtained from the base of the skull through the vertex without intravenous contrast. COMPARISON:  Face CT earlier this day. Head CT 08/18/2018 FINDINGS: Brain: Generalized atrophy is stable from prior exam, but age advanced. Moderate periventricular white matter hypodensity consistent with chronic small vessel ischemia. No  intracranial hemorrhage, mass effect, or midline shift. No hydrocephalus. The basilar cisterns are patent. No evidence of territorial infarct or acute ischemia. No extra-axial or intracranial fluid collection. Vascular: No hyperdense vessel. Skull: No fracture or focal lesion. Sinuses/Orbits: Left supraorbital hematoma has slightly increased in size from CT earlier this day. Other: No new soft tissue abnormality. IMPRESSION: 1. No acute intracranial abnormality. No skull fracture. 2. Left supraorbital hematoma has slightly increased in size from CT earlier this day. 3. Generalized atrophy, stable from prior exam but advanced for age. Chronic small vessel ischemia. Electronically Signed   By: Keith Rake M.D.   On: 06/05/2019 23:13   CT Maxillofacial Wo Contrast  Result Date: 06/05/2019 CLINICAL DATA:  Facial trauma, slip and fall, laceration above left eye EXAM: CT MAXILLOFACIAL WITHOUT CONTRAST TECHNIQUE: Multidetector CT imaging of the maxillofacial structures was performed. Multiplanar CT image reconstructions were also generated. COMPARISON:  None. FINDINGS: Osseous: No fracture or mandibular dislocation. No destructive process. Orbits: Negative. No traumatic or inflammatory finding. Sinuses: Clear. Soft tissues: Soft  tissue laceration and hematoma over the left brow. Limited intracranial: No significant or unexpected finding. IMPRESSION: 1.  No displaced fracture or dislocation of the facial bones. 2.  Soft tissue laceration and hematoma over the left brow. Electronically Signed   By: Eddie Candle M.D.   On: 06/05/2019 14:43     Subjective: Patient was feeling better when seen today.  She wants to go home as soon as possible.  She was having little worsening of left eyelid swelling and ecchymosis.  Explained to her juice ice to keep the swelling down.  No more active bleeding from the laceration.  Discharge Exam: Vitals:   06/06/19 0050 06/06/19 0854  BP: 108/68 110/67  Pulse: 82 86  Resp:  16   Temp:  98.6 F (37 C)  SpO2: 98% 100%   Vitals:   06/05/19 2124 06/05/19 2126 06/06/19 0050 06/06/19 0854  BP:  106/64 108/68 110/67  Pulse:  79 82 86  Resp:  18 16   Temp:  98.5 F (36.9 C)  98.6 F (37 C)  TempSrc:  Oral  Oral  SpO2:  97% 98% 100%  Weight: 56.7 kg     Height: 5\' 2"  (1.575 m)       General: Pt is alert, awake, not in acute distress, left eyelid edema and ecchymosis, bandage with some blood stain above left eyebrow. Cardiovascular: RRR, S1/S2 +, no rubs, no gallops Respiratory: CTA bilaterally, no wheezing, no rhonchi Abdominal: Soft, NT, ND, bowel sounds + Extremities: no edema, no cyanosis   The results of significant diagnostics from this hospitalization (including imaging, microbiology, ancillary and laboratory) are listed below for reference.    Microbiology: Recent Results (from the past 240 hour(s))  SARS CORONAVIRUS 2 (TAT 6-24 HRS) Nasopharyngeal Nasopharyngeal Swab     Status: None   Collection Time: 05/27/19  8:07 PM   Specimen: Nasopharyngeal Swab  Result Value Ref Range Status   SARS Coronavirus 2 NEGATIVE NEGATIVE Final    Comment: (NOTE) SARS-CoV-2 target nucleic acids are NOT DETECTED. The SARS-CoV-2 RNA is generally detectable in upper and lower respiratory specimens during the acute phase of infection. Negative results do not preclude SARS-CoV-2 infection, do not rule out co-infections with other pathogens, and should not be used as the sole basis for treatment or other patient management decisions. Negative results must be combined with clinical observations, patient history, and epidemiological information. The expected result is Negative. Fact Sheet for Patients: SugarRoll.be Fact Sheet for Healthcare Providers: https://www.woods-mathews.com/ This test is not yet approved or cleared by the [place] FDA and  has been authorized for detection and/or diagnosis of SARS-CoV-2 by FDA  under an Emergency Use Authorization (EUA). This EUA will remain  in effect (meaning this test can be used) for the duration of the COVID-19 declaration under Section 56 4(b)(1) of the Act, 21 U.S.C. section 360bbb-3(b)(1), unless the authorization is terminated or revoked sooner. Performed at [facility], [place] [address], [place], [place] [address]   SARS CORONAVIRUS 2 (TAT 6-24 HRS) Nasopharyngeal Nasopharyngeal Swab     Status: None   Collection Time: 06/06/19 12:36 AM   Specimen: Nasopharyngeal Swab  Result Value Ref Range Status   SARS Coronavirus 2 NEGATIVE NEGATIVE Final    Comment: (NOTE) SARS-CoV-2 target nucleic acids are NOT DETECTED. The SARS-CoV-2 RNA is generally detectable in upper and lower respiratory specimens during the acute phase of infection. Negative results do not preclude SARS-CoV-2 infection, do not rule out co-infections with other pathogens,  and should not be used as the sole basis for treatment or other patient management decisions. Negative results must be combined with clinical observations, patient history, and epidemiological information. The expected result is Negative. Fact Sheet for Patients: SugarRoll.be Fact Sheet for Healthcare Providers: https://www.woods-mathews.com/ This test is not yet approved or cleared by the [place] FDA and  has been authorized for detection and/or diagnosis of SARS-CoV-2 by FDA under an Emergency Use Authorization (EUA). This EUA will remain  in effect (meaning this test can be used) for the duration of the COVID-19 declaration under Section 56 4(b)(1) of the Act, 21 U.S.C. section 360bbb-3(b)(1), unless the authorization is terminated or revoked sooner. Performed at [facility], [place] [address], [place], [place] [address]      Labs: BNP (last 3 results) No results for input(s): BNP in the last 8760 hours. Basic Metabolic Panel: Recent Labs  Lab  06/05/19 2241 06/06/19 0933  NA 140 142  K 3.9 3.9  CL 106 106  CO2 25 28  GLUCOSE 117* 121*  BUN 11 10  CREATININE 0.55 0.49  CALCIUM 8.6* 8.6*   Liver Function Tests: Recent Labs  Lab 06/05/19 2241 06/06/19 0933  AST 66* 58*  ALT 34 31  ALKPHOS 102 91  BILITOT 4.6* 4.1*  PROT 6.5 6.2*  ALBUMIN 2.9* 2.8*   No results for input(s): LIPASE, AMYLASE in the last 168 hours. Recent Labs  Lab 06/05/19 2241 06/06/19 0933  AMMONIA 77* 26   CBC: Recent Labs  Lab 06/05/19 2241 06/06/19 0933  WBC 6.2 4.4  HGB 11.1* 10.6*  HCT 33.3* 33.8*  MCV 106.1* 109.4*  PLT 113* 93*   Cardiac Enzymes: No results for input(s): CKTOTAL, CKMB, CKMBINDEX, TROPONINI in the last 168 hours. BNP: Invalid input(s): POCBNP CBG: No results for input(s): GLUCAP in the last 168 hours. D-Dimer No results for input(s): DDIMER in the last 72 hours. Hgb A1c No results for input(s): HGBA1C in the last 72 hours. Lipid Profile No results for input(s): CHOL, HDL, LDLCALC, TRIG, CHOLHDL, LDLDIRECT in the last 72 hours. Thyroid function studies No results for input(s): TSH, T4TOTAL, T3FREE, THYROIDAB in the last 72 hours.  Invalid input(s): FREET3 Anemia work up No results for input(s): VITAMINB12, FOLATE, FERRITIN, TIBC, IRON, RETICCTPCT in the last 72 hours. Urinalysis    Component Value Date/Time   COLORURINE AMBER (A) 05/27/2019 1719   APPEARANCEUR CLEAR (A) 05/27/2019 1719   LABSPEC 1.028 05/27/2019 1719   PHURINE 6.0 05/27/2019 1719   GLUCOSEU NEGATIVE 05/27/2019 1719   HGBUR NEGATIVE 05/27/2019 1719   BILIRUBINUR MODERATE (A) 05/27/2019 1719   KETONESUR NEGATIVE 05/27/2019 1719   PROTEINUR 30 (A) 05/27/2019 1719   NITRITE NEGATIVE 05/27/2019 1719   LEUKOCYTESUR NEGATIVE 05/27/2019 1719   Sepsis Labs Invalid input(s): PROCALCITONIN,  WBC,  LACTICIDVEN Microbiology Recent Results (from the past 240 hour(s))  SARS CORONAVIRUS 2 (TAT 6-24 HRS) Nasopharyngeal Nasopharyngeal Swab      Status: None   Collection Time: 05/27/19  8:07 PM   Specimen: Nasopharyngeal Swab  Result Value Ref Range Status   SARS Coronavirus 2 NEGATIVE NEGATIVE Final    Comment: (NOTE) SARS-CoV-2 target nucleic acids are NOT DETECTED. The SARS-CoV-2 RNA is generally detectable in upper and lower respiratory specimens during the acute phase of infection. Negative results do not preclude SARS-CoV-2 infection, do not rule out co-infections with other pathogens, and should not be used as the sole basis for treatment or other patient management decisions. Negative results must  be combined with clinical observations, patient history, and epidemiological information. The expected result is Negative. Fact Sheet for Patients: SugarRoll.be Fact Sheet for Healthcare Providers: https://www.woods-mathews.com/ This test is not yet approved or cleared by the [place] FDA and  has been authorized for detection and/or diagnosis of SARS-CoV-2 by FDA under an Emergency Use Authorization (EUA). This EUA will remain  in effect (meaning this test can be used) for the duration of the COVID-19 declaration under Section 56 4(b)(1) of the Act, 21 U.S.C. section 360bbb-3(b)(1), unless the authorization is terminated or revoked sooner. Performed at [facility], [place] [address], [place], [place] [address]   SARS CORONAVIRUS 2 (TAT 6-24 HRS) Nasopharyngeal Nasopharyngeal Swab     Status: None   Collection Time: 06/06/19 12:36 AM   Specimen: Nasopharyngeal Swab  Result Value Ref Range Status   SARS Coronavirus 2 NEGATIVE NEGATIVE Final    Comment: (NOTE) SARS-CoV-2 target nucleic acids are NOT DETECTED. The SARS-CoV-2 RNA is generally detectable in upper and lower respiratory specimens during the acute phase of infection. Negative results do not preclude SARS-CoV-2 infection, do not rule out co-infections with other pathogens, and should not be used as the sole  basis for treatment or other patient management decisions. Negative results must be combined with clinical observations, patient history, and epidemiological information. The expected result is Negative. Fact Sheet for Patients: SugarRoll.be Fact Sheet for Healthcare Providers: https://www.woods-mathews.com/ This test is not yet approved or cleared by the [place] FDA and  has been authorized for detection and/or diagnosis of SARS-CoV-2 by FDA under an Emergency Use Authorization (EUA). This EUA will remain  in effect (meaning this test can be used) for the duration of the COVID-19 declaration under Section 56 4(b)(1) of the Act, 21 U.S.C. section 360bbb-3(b)(1), unless the authorization is terminated or revoked sooner. Performed at [facility], [place] [address] [place], [place] [address]     Time coordinating discharge: Over 30 minutes  SIGNED:  Lorella Nimrod, MD  [facility] 06/06/2019, 10:33 AM  If 7PM-7AM, please contact night-coverage [email or web address]  This record has been created using Systems analyst. Errors have been sought and corrected,but may not always be located. Such creation errors do not reflect on the standard of care.

## 2019-06-06 NOTE — ED Notes (Signed)
Admitting Provider at bedside. 

## 2019-06-15 ENCOUNTER — Other Ambulatory Visit: Payer: Self-pay

## 2019-06-15 ENCOUNTER — Encounter: Payer: Self-pay | Admitting: *Deleted

## 2019-06-15 DIAGNOSIS — R519 Headache, unspecified: Secondary | ICD-10-CM | POA: Diagnosis not present

## 2019-06-15 DIAGNOSIS — Z9181 History of falling: Secondary | ICD-10-CM | POA: Diagnosis not present

## 2019-06-15 DIAGNOSIS — R110 Nausea: Secondary | ICD-10-CM | POA: Diagnosis not present

## 2019-06-15 LAB — URINALYSIS, COMPLETE (UACMP) WITH MICROSCOPIC
Bilirubin Urine: NEGATIVE
Glucose, UA: NEGATIVE mg/dL
Hgb urine dipstick: NEGATIVE
Ketones, ur: NEGATIVE mg/dL
Nitrite: NEGATIVE
Protein, ur: NEGATIVE mg/dL
Specific Gravity, Urine: 1.015 (ref 1.005–1.030)
pH: 7 (ref 5.0–8.0)

## 2019-06-15 LAB — CBC
HCT: 35.4 % — ABNORMAL LOW (ref 36.0–46.0)
Hemoglobin: 11.8 g/dL — ABNORMAL LOW (ref 12.0–15.0)
MCH: 34.4 pg — ABNORMAL HIGH (ref 26.0–34.0)
MCHC: 33.3 g/dL (ref 30.0–36.0)
MCV: 103.2 fL — ABNORMAL HIGH (ref 80.0–100.0)
Platelets: 91 10*3/uL — ABNORMAL LOW (ref 150–400)
RBC: 3.43 MIL/uL — ABNORMAL LOW (ref 3.87–5.11)
RDW: 13.3 % (ref 11.5–15.5)
WBC: 3.9 10*3/uL — ABNORMAL LOW (ref 4.0–10.5)
nRBC: 0 % (ref 0.0–0.2)

## 2019-06-15 LAB — COMPREHENSIVE METABOLIC PANEL
ALT: 32 U/L (ref 0–44)
AST: 65 U/L — ABNORMAL HIGH (ref 15–41)
Albumin: 3.7 g/dL (ref 3.5–5.0)
Alkaline Phosphatase: 116 U/L (ref 38–126)
Anion gap: 9 (ref 5–15)
BUN: 11 mg/dL (ref 6–20)
CO2: 28 mmol/L (ref 22–32)
Calcium: 10.3 mg/dL (ref 8.9–10.3)
Chloride: 105 mmol/L (ref 98–111)
Creatinine, Ser: 0.44 mg/dL (ref 0.44–1.00)
GFR calc Af Amer: >60 mL/min (ref >60)
GFR calc non Af Amer: >60 mL/min (ref >60)
Glucose, Bld: 121 mg/dL — ABNORMAL HIGH (ref 70–99)
Potassium: 4 mmol/L (ref 3.5–5.1)
Sodium: 142 mmol/L (ref 135–145)
Total Bilirubin: 5.9 mg/dL — ABNORMAL HIGH (ref 0.3–1.2)
Total Protein: 8 g/dL (ref 6.5–8.1)

## 2019-06-15 LAB — AMMONIA: Ammonia: 55 umol/L — ABNORMAL HIGH (ref 9–35)

## 2019-06-15 MED ORDER — ONDANSETRON 4 MG PO TBDP
4.0000 mg | ORAL_TABLET | Freq: Once | ORAL | Status: AC | PRN
  Administered 2019-06-15: 4 mg via ORAL
  Filled 2019-06-15: qty 1

## 2019-06-15 NOTE — ED Triage Notes (Signed)
Pt returning to ED reporting continued head pain since fall 10 days ago. Significant bruising noted to face. Nausea without vomiting.   Pt also concerned for the yellowing of her eyes due to hx of cirrhosis. Pt has been taking lactulose as prescribed  But reports she has not been having BMs. Pt requesting ammonia be checked

## 2019-06-15 NOTE — ED Notes (Addendum)
Patient assisted by this RN to bathroom

## 2019-06-15 NOTE — ED Notes (Signed)
Pt assisted to the bathroom by EDT at this time.

## 2019-06-16 ENCOUNTER — Emergency Department
Admission: EM | Admit: 2019-06-16 | Discharge: 2019-06-16 | Disposition: A | Discharge status: Home / Self Care | Payer: PRIVATE HEALTH INSURANCE | Attending: Emergency Medicine | Admitting: Emergency Medicine

## 2019-06-16 ENCOUNTER — Emergency Department: Payer: PRIVATE HEALTH INSURANCE

## 2019-06-16 DIAGNOSIS — R519 Headache, unspecified: Secondary | ICD-10-CM

## 2019-06-16 LAB — PROTIME-INR
INR: 1.4 — ABNORMAL HIGH (ref 0.8–1.2)
Prothrombin Time: 17.4 seconds — ABNORMAL HIGH (ref 11.4–15.2)

## 2019-06-16 LAB — ETHANOL: Alcohol, Ethyl (B): 91 mg/dL — ABNORMAL HIGH (ref <10)

## 2019-06-16 MED ORDER — LORAZEPAM 1 MG PO TABS
1.0000 mg | ORAL_TABLET | Freq: Once | ORAL | Status: AC
  Administered 2019-06-16: 1 mg via ORAL
  Filled 2019-06-16: qty 1

## 2019-06-16 MED ORDER — ONDANSETRON 4 MG PO TBDP
4.0000 mg | ORAL_TABLET | Freq: Once | ORAL | Status: AC | PRN
  Administered 2019-06-16: 4 mg via ORAL
  Filled 2019-06-16: qty 1

## 2019-06-16 MED ORDER — FOSFOMYCIN TROMETHAMINE 3 G PO PACK
3.0000 g | PACK | Freq: Once | ORAL | Status: AC
  Administered 2019-06-16: 3 g via ORAL
  Filled 2019-06-16: qty 3

## 2019-06-16 MED ORDER — LACTULOSE ENEMA
300.0000 mL | Freq: Once | ORAL | Status: AC
  Administered 2019-06-16: 300 mL via RECTAL
  Filled 2019-06-16: qty 300

## 2019-06-16 MED ORDER — MELATONIN 5 MG PO TABS
5.0000 mg | ORAL_TABLET | Freq: Every day | ORAL | Status: DC
  Filled 2019-06-16: qty 1

## 2019-06-16 NOTE — ED Notes (Signed)
Pt transported to CT scan.

## 2019-06-16 NOTE — ED Provider Notes (Signed)
[facility] Emergency Department Provider Note   ____________________________________________   First MD Initiated Contact with Patient 06/16/19 0028     (approximate)  I have reviewed the triage vital signs and the nursing notes.   HISTORY  Chief Complaint Headache and Emesis    HPI Lisa Dennis is a 54 y.o. female who returns to the ED with a chief complaint of persistent head pain since fall 10 days ago.  Associated nausea without vomiting.  Patient reports she is taking lactulose as directed for alcoholic cirrhosis but it usually takes 3 to 4 days before she has a bowel movement and she is concerned for yellowing of her eyes.  Denies EtOH tonight.  She was admitted for hepatic encephalopathy and fall on 3/13 with negative head CT.  Ammonia on admission was 70.  Denies fever, cough, chest pain, shortness of breath, abdominal pain, vomiting.       Past Medical History:  Diagnosis Date  . Alcohol abuse   . Anxiety   . Cirrhosis [facility])     Patient Active Problem List   Diagnosis Date Noted  . Hepatic encephalopathy ([place]) 06/05/2019  . 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 Rectal bleeding   . Acute GI bleeding 04/03/2019  . Alcohol withdrawal syndrome without complication ([place]) 123XX123  . Bleeding behind the abdominal cavity   . SBP (spontaneous bacterial peritonitis) ([place])   . Intra-abdominal varices   . Hemorrhage, intraperitoneal 11/15/2018  . Alcoholic cirrhosis of liver with ascites ([place]) 11/13/2018  . Alcoholic intoxication with complication ([place]) 123XX123  . Decompensated hepatic cirrhosis ([place]) 08/27/2018  . GI bleeding 06/08/2018  . Alcoholic cirrhosis of liver without ascites ([place])   . Hematemesis without nausea     Past Surgical History:  Procedure Laterality Date  . COLONOSCOPY WITH PROPOFOL N/A 04/06/2019   Procedure: COLONOSCOPY WITH PROPOFOL;  Surgeon: Lin Landsman, MD;   Location: [facility] ENDOSCOPY;  Service: Gastroenterology;  Laterality: N/A;  . ESOPHAGOGASTRODUODENOSCOPY (EGD) WITH PROPOFOL N/A 06/08/2018   Procedure: ESOPHAGOGASTRODUODENOSCOPY (EGD) WITH PROPOFOL;  Surgeon: Lucilla Lame, MD;  Location: [facility] ENDOSCOPY;  Service: Endoscopy;  Laterality: N/A;  . ESOPHAGOGASTRODUODENOSCOPY (EGD) WITH PROPOFOL N/A 11/12/2018   Procedure: ESOPHAGOGASTRODUODENOSCOPY (EGD) WITH PROPOFOL;  Surgeon: Jonathon Bellows, MD;  Location: [facility] ENDOSCOPY;  Service: Gastroenterology;  Laterality: N/A;    Prior to Admission medications   Medication Sig Start Date End Date Taking? Authorizing Provider  folic acid (FOLVITE) 1 MG tablet Take 1 tablet (1 mg total) by mouth daily. 01/30/19  Yes Carrie Mew, MD  lactulose (CHRONULAC) 10 GM/15ML solution Take 30 mLs (20 g total) by mouth 3 (three) times daily. Hold for more than 2 loose bowel movements. 06/06/19  Yes Lorella Nimrod, MD  LORazepam (ATIVAN) 0.5 MG tablet Take 0.5 mg by mouth daily as needed for anxiety.  05/20/19  Yes [provider]  Multiple Vitamin (MULTIVITAMIN WITH MINERALS) TABS tablet Take 1 tablet by mouth daily. 05/29/19  Yes Swayze, Ava, DO  nadolol (CORGARD) 20 MG tablet Take 20 mg by mouth daily.   Yes [provider]  pantoprazole (PROTONIX) 40 MG tablet Take 40 mg by mouth daily.   Yes [provider]  thiamine 100 MG tablet Take 1 tablet (100 mg total) by mouth daily. 05/29/19  Yes Swayze, Ava, DO  oxyCODONE-acetaminophen (PERCOCET) 7.5-325 MG tablet Take 1 tablet by mouth every 6 (six) hours as needed. Patient not taking: Reported on 06/06/2019 06/05/19   Mardee Postin  K, PA-C    Allergies Benadryl [diphenhydramine], Venlafaxine hcl er, Penicillins, Clindamycin/lincomycin, Latex, Morphine and related, Sulfa antibiotics, and Tetracyclines & related  History reviewed. No pertinent family history.  Social History Social History   Tobacco Use  . Smoking status: Former Research scientist (life sciences)  .  Smokeless tobacco: Never Used  Substance Use Topics  . Alcohol use: Yes    Alcohol/week: 10.0 standard drinks    Types: 10 Glasses of wine per week    Comment: 5 glasses yesterday  . Drug use: Never    Review of Systems  Constitutional: No fever/chills Eyes: No visual changes. ENT: No sore throat. Cardiovascular: Denies chest pain. Respiratory: Denies shortness of breath. Gastrointestinal: No abdominal pain.  Positive for nausea, no vomiting.  No diarrhea.  No constipation. Genitourinary: Negative for dysuria. Musculoskeletal: Negative for back pain. Skin: Negative for rash. Neurological: Positive for headaches. Negative for focal weakness or numbness.   ____________________________________________   PHYSICAL EXAM:  VITAL SIGNS: ED Triage Vitals  Enc Vitals Group     BP 06/15/19 2056 (!) 155/97     Pulse Rate 06/15/19 2056 (!) 53     Resp 06/15/19 2056 16     Temp 06/15/19 2056 98.5 F (36.9 C)     Temp Source 06/15/19 2056 Oral     SpO2 06/15/19 2056 99 %     Weight 06/15/19 2057 125 lb (56.7 kg)     Height 06/15/19 2057 5\' 2"  (1.575 m)     Head Circumference --      Peak Flow --      Pain Score 06/15/19 2057 8     Pain Loc --      Pain Edu? --      Excl. in [place]? --     Constitutional: Alert and oriented.  Intoxicated appearing and in no acute distress. Eyes: Bilateral scleral icterus. PERRL. EOMI. Old bilateral periorbital hematomas; left supraorbital hematoma. Head: Old facial contusions to bilateral cheeks. Nose: Atraumatic. Mouth/Throat: Mucous membranes are moist.  No dental malocclusion. Neck: No stridor.  No cervical spine tenderness to palpation. Cardiovascular: Normal rate, regular rhythm. II/VI SEM. Good peripheral circulation. Respiratory: Normal respiratory effort.  No retractions. Lungs CTAB. Gastrointestinal: Soft and nontender to light or deep palpation. No distention. No abdominal bruits. No CVA tenderness. Musculoskeletal: No lower extremity  tenderness nor edema.  No joint effusions. Neurologic: Alert and oriented x3.  CN II-XII grossly intact.  Normal speech and language. No gross focal neurologic deficits are appreciated.  Skin:  Skin is jaundiced, warm, dry and intact. No rash noted.  No petechiae. Psychiatric: Mood and affect are normal. Speech and behavior are normal.  ____________________________________________   LABS (all labs ordered are listed, but only abnormal results are displayed)  Labs Reviewed  COMPREHENSIVE METABOLIC PANEL - Abnormal; Notable for the following components:      Result Value   Glucose, Bld 121 (*)    AST 65 (*)    Total Bilirubin 5.9 (*)    All other components within normal limits  CBC - Abnormal; Notable for the following components:   WBC 3.9 (*)    RBC 3.43 (*)    Hemoglobin 11.8 (*)    HCT 35.4 (*)    MCV 103.2 (*)    MCH 34.4 (*)    Platelets 91 (*)    All other components within normal limits  URINALYSIS, COMPLETE (UACMP) WITH MICROSCOPIC - Abnormal; Notable for the following components:   Color, Urine AMBER (*)  APPearance CLOUDY (*)    Leukocytes,Ua TRACE (*)    Bacteria, UA RARE (*)    All other components within normal limits  AMMONIA - Abnormal; Notable for the following components:   Ammonia 55 (*)    All other components within normal limits  ETHANOL - Abnormal; Notable for the following components:   Alcohol, Ethyl (B) 91 (*)    All other components within normal limits  PROTIME-INR - Abnormal; Notable for the following components:   Prothrombin Time 17.4 (*)    INR 1.4 (*)    All other components within normal limits   ____________________________________________  EKG  None ____________________________________________  RADIOLOGY  ED MD interpretation:  No ICH  Official radiology report(s): CT Head Wo Contrast  Result Date: 06/16/2019 CLINICAL DATA:  Fall 10 days ago EXAM: CT HEAD WITHOUT CONTRAST TECHNIQUE: Contiguous axial images were obtained  from the base of the skull through the vertex without intravenous contrast. COMPARISON:  June 05, 2019 FINDINGS: Brain: No evidence of acute territorial infarction, hemorrhage, hydrocephalus,extra-axial collection or mass lesion/mass effect. There is dilatation the ventricles and sulci consistent with age-related atrophy. Low-attenuation changes in the deep white matter consistent with small vessel ischemia. Vascular: No hyperdense vessel or unexpected calcification. Skull: The skull is intact. No fracture or focal lesion identified. Sinuses/Orbits: The visualized paranasal sinuses and mastoid air cells are clear. The orbits and globes intact. Other: There is been slight interval increase in size of the left soft tissue hematoma measuring 3.3 cm overlying the left frontal skull. IMPRESSION: No acute intracranial abnormality. Findings consistent with mild age related atrophy and chronic small vessel ischemia Slight interval increase in size of the left frontal skull soft tissue hematoma. Electronically Signed   By: Prudencio Pair M.D.   On: 06/16/2019 01:37    ____________________________________________   PROCEDURES  Procedure(s) performed (including Critical Care):  Procedures   ____________________________________________   INITIAL IMPRESSION / ASSESSMENT AND PLAN / ED COURSE  As part of my medical decision making, I reviewed the following data within the [place] notes reviewed and incorporated, Labs reviewed, Old chart reviewed, Radiograph reviewed and Notes from prior ED visits     Latunya Halberstadt was evaluated in Emergency Department on 06/16/2019 for the symptoms described in the history of present illness. She was evaluated in the context of the global COVID-19 pandemic, which necessitated consideration that the patient might be at risk for infection with the SARS-CoV-2 virus that causes COVID-19. Institutional protocols and algorithms that pertain to the  evaluation of patients at risk for COVID-19 are in a state of rapid change based on information released by regulatory bodies including the CDC and federal and state organizations. These policies and algorithms were followed during the patient's care in the ED.    54 year old alcoholic who returns to the ED approximately 10 days status post fall with persistent headache and nausea.  Also states she is taking lactulose but not having bowel movements.  Differential diagnosis includes, but is not limited to, intracranial hemorrhage, meningitis/encephalitis, previous head trauma, cavernous venous thrombosis, tension headache, temporal arteritis, migraine or migraine equivalent, idiopathic intracranial hypertension, and non-specific headache.  Laboratory results demonstrate stable T bili and platelets.  Ammonia not as elevated as it was on patient's prior admission.  Will obtain CT head to evaluate for ICH, administer lactulose enema, administer antiemetic and reassess.   Clinical Course as of Jun 15 501  Wed Jun 16, 2019  0357 Patient had BM  after lactulose enema.  Updated her of all test results.  Fosfomycin given for trace leukocytes and 6-10 WBC in urine.  Asking for something to help her sleep when she gets home.  Strict return precautions given.  Patient verbalizes understanding and agrees with plan of care.   [JS]    Clinical Course User Index [JS] Paulette Blanch, MD     ____________________________________________   FINAL CLINICAL IMPRESSION(S) / ED DIAGNOSES  Final diagnoses:  Acute nonintractable headache, unspecified headache type     ED Discharge Orders    None       Note:  This document was prepared using Dragon voice recognition software and may include unintentional dictation errors.   Paulette Blanch, MD 06/16/19 [phone]

## 2019-06-16 NOTE — Discharge Instructions (Addendum)
Return to the ER for worsening symptoms, persistent vomiting, difficulty breathing or other concerns. °

## 2019-06-29 ENCOUNTER — Emergency Department: Payer: PRIVATE HEALTH INSURANCE

## 2019-06-29 ENCOUNTER — Other Ambulatory Visit: Payer: Self-pay

## 2019-06-29 ENCOUNTER — Emergency Department
Admission: EM | Admit: 2019-06-29 | Discharge: 2019-06-29 | Disposition: A | Discharge status: Home / Self Care | Payer: PRIVATE HEALTH INSURANCE | Attending: Emergency Medicine | Admitting: Emergency Medicine

## 2019-06-29 DIAGNOSIS — R030 Elevated blood-pressure reading, without diagnosis of hypertension: Secondary | ICD-10-CM | POA: Diagnosis not present

## 2019-06-29 DIAGNOSIS — N390 Urinary tract infection, site not specified: Secondary | ICD-10-CM | POA: Insufficient documentation

## 2019-06-29 DIAGNOSIS — Z91040 Latex allergy status: Secondary | ICD-10-CM | POA: Insufficient documentation

## 2019-06-29 DIAGNOSIS — Z79899 Other long term (current) drug therapy: Secondary | ICD-10-CM | POA: Insufficient documentation

## 2019-06-29 DIAGNOSIS — K625 Hemorrhage of anus and rectum: Secondary | ICD-10-CM | POA: Insufficient documentation

## 2019-06-29 DIAGNOSIS — R197 Diarrhea, unspecified: Secondary | ICD-10-CM | POA: Insufficient documentation

## 2019-06-29 DIAGNOSIS — Z87891 Personal history of nicotine dependence: Secondary | ICD-10-CM | POA: Insufficient documentation

## 2019-06-29 DIAGNOSIS — R1030 Lower abdominal pain, unspecified: Secondary | ICD-10-CM | POA: Diagnosis present

## 2019-06-29 LAB — CBC
HCT: 38.6 % (ref 36.0–46.0)
Hemoglobin: 13 g/dL (ref 12.0–15.0)
MCH: 33.9 pg (ref 26.0–34.0)
MCHC: 33.7 g/dL (ref 30.0–36.0)
MCV: 100.8 fL — ABNORMAL HIGH (ref 80.0–100.0)
Platelets: 92 10*3/uL — ABNORMAL LOW (ref 150–400)
RBC: 3.83 MIL/uL — ABNORMAL LOW (ref 3.87–5.11)
RDW: 13.2 % (ref 11.5–15.5)
WBC: 6.9 10*3/uL (ref 4.0–10.5)
nRBC: 0 % (ref 0.0–0.2)

## 2019-06-29 LAB — URINALYSIS, COMPLETE (UACMP) WITH MICROSCOPIC
Glucose, UA: NEGATIVE mg/dL
Hgb urine dipstick: NEGATIVE
Ketones, ur: NEGATIVE mg/dL
Nitrite: NEGATIVE
Protein, ur: 100 mg/dL — AB
Specific Gravity, Urine: 1.024 (ref 1.005–1.030)
pH: 8 (ref 5.0–8.0)

## 2019-06-29 LAB — COMPREHENSIVE METABOLIC PANEL
ALT: 33 U/L (ref 0–44)
AST: 72 U/L — ABNORMAL HIGH (ref 15–41)
Albumin: 3.7 g/dL (ref 3.5–5.0)
Alkaline Phosphatase: 105 U/L (ref 38–126)
Anion gap: 9 (ref 5–15)
BUN: 9 mg/dL (ref 6–20)
CO2: 28 mmol/L (ref 22–32)
Calcium: 9.6 mg/dL (ref 8.9–10.3)
Chloride: 101 mmol/L (ref 98–111)
Creatinine, Ser: 0.34 mg/dL — ABNORMAL LOW (ref 0.44–1.00)
GFR calc Af Amer: >60 mL/min (ref >60)
GFR calc non Af Amer: >60 mL/min (ref >60)
Glucose, Bld: 135 mg/dL — ABNORMAL HIGH (ref 70–99)
Potassium: 4.5 mmol/L (ref 3.5–5.1)
Sodium: 138 mmol/L (ref 135–145)
Total Bilirubin: 8.1 mg/dL — ABNORMAL HIGH (ref 0.3–1.2)
Total Protein: 8.1 g/dL (ref 6.5–8.1)

## 2019-06-29 LAB — AMMONIA: Ammonia: 35 umol/L (ref 9–35)

## 2019-06-29 LAB — LIPASE, BLOOD: Lipase: 55 U/L — ABNORMAL HIGH (ref 11–51)

## 2019-06-29 MED ORDER — CEPHALEXIN 500 MG PO CAPS: 500.0000 mg | ORAL_CAPSULE | Freq: Two times a day (BID) | ORAL | 0 refills | Status: AC

## 2019-06-29 MED ORDER — IOHEXOL 300 MG/ML  SOLN
75.0000 mL | Freq: Once | INTRAMUSCULAR | Status: AC | PRN
  Administered 2019-06-29: 75 mL via INTRAVENOUS

## 2019-06-29 MED ORDER — IOHEXOL 9 MG/ML PO SOLN
1000.0000 mL | ORAL | Status: DC | PRN
  Administered 2019-06-29: 1000 mL via ORAL

## 2019-06-29 MED ORDER — ONDANSETRON HCL 4 MG/2ML IJ SOLN
4.0000 mg | Freq: Once | INTRAMUSCULAR | Status: AC
  Administered 2019-06-29: 4 mg via INTRAVENOUS

## 2019-06-29 MED ORDER — ONDANSETRON HCL 4 MG/2ML IJ SOLN
INTRAMUSCULAR | Status: AC
  Filled 2019-06-29: qty 2

## 2019-06-29 NOTE — ED Notes (Signed)
Pt ambulatory to toilet to urinate.   Pt taken to CT via wheelchair.

## 2019-06-29 NOTE — ED Triage Notes (Signed)
Pt to the er for lower abd pain. Pt last BM tonight which was normal. Pt has nausea. No vomiting at this time. Pt has a hx of elevated ammonia levels.

## 2019-06-29 NOTE — ED Notes (Addendum)
Pt ambulatory to toilet to urinate with steady gait, has grippy socks on.  Pt admits to drinking 2 glasses of wine yesterday. States she takes ativan for anxiety. Asked for ativan at this time. Pt drove herself here. She stated she would rather be able to drive away if everything is okay then call for ride.

## 2019-06-29 NOTE — ED Provider Notes (Signed)
[facility] Emergency Department Provider Note ____________________________________________   First MD Initiated Contact with Patient 06/29/19 [phone]     (approximate)  I have reviewed the triage vital signs and the nursing notes.   HISTORY  Chief Complaint Abdominal Pain    HPI Lisa Dennis is a 54 y.o. female with PMH as noted below including former alcohol abuse, cirrhosis, and is status post a TIPS last year who presents with lower abdominal discomfort since last night, associated with distention.  The patient reports some nausea but no vomiting.  She states that she takes lactulose but often has a delayed response to it.  She took it earlier yesterday but did not have a bowel movement until last night and felt like she had to strain.  She denies any diarrhea.  She states she saw small amount of bright red blood on the tissue after that bowel movement, but denies other bloody stool or any dark tarry stool.  She has a history of hemorrhoids.  She also reports some urinary frequency but no dysuria.   Past Medical History:  Diagnosis Date  . Alcohol abuse   . Anxiety   . Cirrhosis [facility])     Patient Active Problem List   Diagnosis Date Noted  . Hepatic encephalopathy ([place]) 06/05/2019  . 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 Rectal bleeding   . Acute GI bleeding 04/03/2019  . Alcohol withdrawal syndrome without complication ([place]) 123XX123  . Bleeding behind the abdominal cavity   . SBP (spontaneous bacterial peritonitis) ([place])   . Intra-abdominal varices   . Hemorrhage, intraperitoneal 11/15/2018  . Alcoholic cirrhosis of liver with ascites ([place]) 11/13/2018  . Alcoholic intoxication with complication ([place]) 123XX123  . Decompensated hepatic cirrhosis ([place]) 08/27/2018  . GI bleeding 06/08/2018  . Alcoholic cirrhosis of liver without ascites ([place])   . Hematemesis without nausea     Past Surgical History:   Procedure Laterality Date  . COLONOSCOPY WITH PROPOFOL N/A 04/06/2019   Procedure: COLONOSCOPY WITH PROPOFOL;  Surgeon: Lin Landsman, MD;  Location[facility] ENDOSCOPY;  Service: Gastroenterology;  Laterality: N/A;  . ESOPHAGOGASTRODUODENOSCOPY (EGD) WITH PROPOFOL N/A 06/08/2018   Procedure: ESOPHAGOGASTRODUODENOSCOPY (EGD) WITH PROPOFOL;  Surgeon: Lucilla Lame, MD;  Location: [facility] ENDOSCOPY;  Service: Endoscopy;  Laterality: N/A;  . ESOPHAGOGASTRODUODENOSCOPY (EGD) WITH PROPOFOL N/A 11/12/2018   Procedure: ESOPHAGOGASTRODUODENOSCOPY (EGD) WITH PROPOFOL;  Surgeon: Jonathon Bellows, MD;  Location: [facility] ENDOSCOPY;  Service: Gastroenterology;  Laterality: N/A;    Prior to Admission medications   Medication Sig Start Date End Date Taking? Authorizing Provider  cephALEXin (KEFLEX) 500 MG capsule Take 1 capsule (500 mg total) by mouth 2 (two) times daily for 7 days. 06/29/19 07/06/19  Arta Silence, MD  folic acid (FOLVITE) 1 MG tablet Take 1 tablet (1 mg total) by mouth daily. 01/30/19   Carrie Mew, MD  lactulose (CHRONULAC) 10 GM/15ML solution Take 30 mLs (20 g total) by mouth 3 (three) times daily. Hold for more than 2 loose bowel movements. 06/06/19   Lorella Nimrod, MD  LORazepam (ATIVAN) 0.5 MG tablet Take 0.5 mg by mouth daily as needed for anxiety.  05/20/19   [provider]  Multiple Vitamin (MULTIVITAMIN WITH MINERALS) TABS tablet Take 1 tablet by mouth daily. 05/29/19   Swayze, Ava, DO  nadolol (CORGARD) 20 MG tablet Take 20 mg by mouth daily.    [provider]  oxyCODONE-acetaminophen (PERCOCET) 7.5-325 MG tablet Take 1 tablet by mouth every 6 (six)  hours as needed. Patient not taking: Reported on 06/06/2019 06/05/19   Sable Feil, PA-C  pantoprazole (PROTONIX) 40 MG tablet Take 40 mg by mouth daily.    [provider]  thiamine 100 MG tablet Take 1 tablet (100 mg total) by mouth daily. 05/29/19   Swayze, Ava, DO    Allergies Benadryl [diphenhydramine],  Venlafaxine hcl er, Penicillins, Clindamycin/lincomycin, Latex, Morphine and related, Sulfa antibiotics, and Tetracyclines & related  No family history on file.  Social History Social History   Tobacco Use  . Smoking status: Former Research scientist (life sciences)  . Smokeless tobacco: Never Used  Substance Use Topics  . Alcohol use: Yes    Alcohol/week: 10.0 standard drinks    Types: 10 Glasses of wine per week    Comment: a drink 2 weeks ago  . Drug use: Never    Review of Systems  Constitutional: No fever. Eyes: No redness. ENT: No sore throat. Cardiovascular: Denies chest pain. Respiratory: Denies shortness of breath. Gastrointestinal: Positive for nausea.  No vomiting or diarrhea. Genitourinary: Negative for dysuria.  Musculoskeletal: Negative for back pain. Skin: Negative for rash. Neurological: Negative for headache.   ____________________________________________   PHYSICAL EXAM:  VITAL SIGNS: ED Triage Vitals  Enc Vitals Group     BP 06/29/19 0556 (!) 169/92     Pulse Rate 06/29/19 0556 73     Resp 06/29/19 0556 18     Temp 06/29/19 0847 98.8 F (37.1 C)     Temp Source 06/29/19 0847 Oral     SpO2 06/29/19 0556 98 %     Weight 06/29/19 0557 117 lb (53.1 kg)     Height 06/29/19 0557 5\' 2"  (1.575 m)     Head Circumference --      Peak Flow --      Pain Score --      Pain Loc --      Pain Edu? --      Excl. in [place]? --     Constitutional: Alert and oriented.  Relatively well appearing and in no acute distress. Eyes: Conjunctivae are normal.  Mild icterus. Head: Atraumatic. Nose: No congestion/rhinnorhea. Mouth/Throat: Mucous membranes are moist.   Neck: Normal range of motion.  Cardiovascular: Normal rate, regular rhythm.  Good peripheral circulation. Respiratory: Normal respiratory effort.  No retractions.  Gastrointestinal: Soft with mild bilateral lower abdominal discomfort but no focal tenderness. No distention.  Genitourinary: No flank tenderness. Musculoskeletal:  Extremities warm and well perfused.  Neurologic:  Normal speech and language. No gross focal neurologic deficits are appreciated.  Skin:  Skin is warm and dry. No rash noted. Psychiatric: Mood and affect are normal. Speech and behavior are normal.  ____________________________________________   LABS (all labs ordered are listed, but only abnormal results are displayed)  Labs Reviewed  LIPASE, BLOOD - Abnormal; Notable for the following components:      Result Value   Lipase 55 (*)    All other components within normal limits  COMPREHENSIVE METABOLIC PANEL - Abnormal; Notable for the following components:   Glucose, Bld 135 (*)    Creatinine, Ser 0.34 (*)    AST 72 (*)    Total Bilirubin 8.1 (*)    All other components within normal limits  CBC - Abnormal; Notable for the following components:   RBC 3.83 (*)    MCV 100.8 (*)    Platelets 92 (*)    All other components within normal limits  URINALYSIS, COMPLETE (UACMP) WITH MICROSCOPIC - Abnormal; Notable for  the following components:   Color, Urine AMBER (*)    APPearance CLEAR (*)    Bilirubin Urine SMALL (*)    Protein, ur 100 (*)    Leukocytes,Ua TRACE (*)    Bacteria, UA RARE (*)    All other components within normal limits  AMMONIA   ____________________________________________  EKG   ____________________________________________  RADIOLOGY  CT abdomen: Chronic findings with no acute abnormality  ____________________________________________   PROCEDURES  Procedure(s) performed: No  Procedures  Critical Care performed: No ____________________________________________   INITIAL IMPRESSION / ASSESSMENT AND PLAN / ED COURSE  Pertinent labs & imaging results that were available during my care of the patient were reviewed by me and considered in my medical decision making (see chart for details).  54 year old female with a history of cirrhosis status post TIPS last year presents with lower abdominal  discomfort, mainly described as distention.  She denies any associated vomiting.  She reports that she had been taking lactulose but felt constipated until she started having looser bowel movements since last night.  She had to strain initially and had a small amount of blood on the tissue, but no persistent rectal bleeding.  I reviewed the past medical records in [place].  The patient was admitted twice last month with weakness, change in mental status, and falls, as well as an elevated bilirubin and ammonia.  On exam, the patient is overall well-appearing.  She is alert and oriented x4, and is slightly hypertensive with otherwise normal vital signs.  Physical exam reveals mild jaundice, I soft and nontender abdomen, no other significant findings.  Overall given the patient's generally well appearance, normal mental status, and the benign abdominal exam, I suspect symptoms related to constipation which is now relieved with the lactulose, versus possible enteritis or colitis, UTI, or less likely hepatic etiology.  The patient is concerned about whether there could be a complication with her TIPS, although she has not had any previously.  We will obtain labs, CT abdomen, and reassess.  ----------------------------------------- 1:06 PM on 06/29/2019 -----------------------------------------  Urinalysis shows findings compatible with a UTI.  Lab work-up is unremarkable except for a bilirubin of 8 which is within a similar range to what the patient has had here previously and which she states is actually not that high for her.  At the beginning of last month it was 11.9.  The ammonia is normal.  CT abdomen shows no acute findings.  At this time, the patient appears comfortable and is stable for discharge home.  I counseled her on the results of the work-up.  I will prescribe Keflex for the UTI.  Return precautions given, and she expresses  understanding.  ____________________________________________   FINAL CLINICAL IMPRESSION(S) / ED DIAGNOSES  Final diagnoses:  Urinary tract infection without hematuria, site unspecified      NEW MEDICATIONS STARTED DURING THIS VISIT:  New Prescriptions   CEPHALEXIN (KEFLEX) 500 MG CAPSULE    Take 1 capsule (500 mg total) by mouth 2 (two) times daily for 7 days.     Note:  This document was prepared using Dragon voice recognition software and may include unintentional dictation errors.    Arta Silence, MD 06/29/19 1315

## 2019-06-29 NOTE — Discharge Instructions (Signed)
Take the antibiotic as prescribed and finish the full 7-day course.  Follow-up with your primary care doctor and with your hepatologist.  Return to the ER for new, worsening, or persistent severe abdominal pain or distention, vomiting, blood in the stool, weakness, or any other new or worsening symptoms that concern you.

## 2019-06-29 NOTE — ED Notes (Signed)
20G PIV removed from [facility] by Gerald Stabs, [employer] student. Pressure held and dressing applied.

## 2019-06-29 NOTE — ED Notes (Signed)
Pt stated that the oral contrast is making her nauseous and she feels like she's going to vomit. CT informed.

## 2019-08-13 ENCOUNTER — Encounter: Payer: Self-pay | Admitting: Emergency Medicine

## 2019-08-13 ENCOUNTER — Emergency Department: Payer: PRIVATE HEALTH INSURANCE

## 2019-08-13 ENCOUNTER — Other Ambulatory Visit: Payer: Self-pay

## 2019-08-13 ENCOUNTER — Inpatient Hospital Stay
Admission: EM | Admit: 2019-08-13 | Discharge: 2019-08-18 | DRG: 442 | Disposition: A | Discharge status: Home / Self Care | Payer: PRIVATE HEALTH INSURANCE | Attending: Internal Medicine | Admitting: Internal Medicine

## 2019-08-13 DIAGNOSIS — D539 Nutritional anemia, unspecified: Secondary | ICD-10-CM | POA: Diagnosis present

## 2019-08-13 DIAGNOSIS — K7011 Alcoholic hepatitis with ascites: Secondary | ICD-10-CM | POA: Diagnosis present

## 2019-08-13 DIAGNOSIS — Z7189 Other specified counseling: Secondary | ICD-10-CM

## 2019-08-13 DIAGNOSIS — Z20822 Contact with and (suspected) exposure to covid-19: Secondary | ICD-10-CM | POA: Diagnosis present

## 2019-08-13 DIAGNOSIS — N390 Urinary tract infection, site not specified: Secondary | ICD-10-CM | POA: Diagnosis present

## 2019-08-13 DIAGNOSIS — Z87891 Personal history of nicotine dependence: Secondary | ICD-10-CM

## 2019-08-13 DIAGNOSIS — K7460 Unspecified cirrhosis of liver: Secondary | ICD-10-CM | POA: Diagnosis not present

## 2019-08-13 DIAGNOSIS — Z79899 Other long term (current) drug therapy: Secondary | ICD-10-CM

## 2019-08-13 DIAGNOSIS — F1010 Alcohol abuse, uncomplicated: Secondary | ICD-10-CM | POA: Diagnosis present

## 2019-08-13 DIAGNOSIS — Z515 Encounter for palliative care: Secondary | ICD-10-CM

## 2019-08-13 DIAGNOSIS — K7031 Alcoholic cirrhosis of liver with ascites: Secondary | ICD-10-CM | POA: Diagnosis present

## 2019-08-13 DIAGNOSIS — B9620 Unspecified Escherichia coli [E. coli] as the cause of diseases classified elsewhere: Secondary | ICD-10-CM | POA: Diagnosis not present

## 2019-08-13 DIAGNOSIS — Z9114 Patient's other noncompliance with medication regimen: Secondary | ICD-10-CM

## 2019-08-13 DIAGNOSIS — E8809 Other disorders of plasma-protein metabolism, not elsewhere classified: Secondary | ICD-10-CM | POA: Diagnosis present

## 2019-08-13 DIAGNOSIS — F419 Anxiety disorder, unspecified: Secondary | ICD-10-CM | POA: Diagnosis present

## 2019-08-13 DIAGNOSIS — K652 Spontaneous bacterial peritonitis: Secondary | ICD-10-CM | POA: Diagnosis not present

## 2019-08-13 DIAGNOSIS — K3189 Other diseases of stomach and duodenum: Secondary | ICD-10-CM | POA: Diagnosis present

## 2019-08-13 DIAGNOSIS — Z79891 Long term (current) use of opiate analgesic: Secondary | ICD-10-CM | POA: Diagnosis not present

## 2019-08-13 DIAGNOSIS — E876 Hypokalemia: Secondary | ICD-10-CM | POA: Diagnosis present

## 2019-08-13 DIAGNOSIS — R17 Unspecified jaundice: Secondary | ICD-10-CM | POA: Diagnosis not present

## 2019-08-13 DIAGNOSIS — D696 Thrombocytopenia, unspecified: Secondary | ICD-10-CM | POA: Diagnosis not present

## 2019-08-13 DIAGNOSIS — K7290 Hepatic failure, unspecified without coma: Principal | ICD-10-CM

## 2019-08-13 DIAGNOSIS — D6959 Other secondary thrombocytopenia: Secondary | ICD-10-CM | POA: Diagnosis present

## 2019-08-13 DIAGNOSIS — K644 Residual hemorrhoidal skin tags: Secondary | ICD-10-CM | POA: Diagnosis present

## 2019-08-13 DIAGNOSIS — D689 Coagulation defect, unspecified: Secondary | ICD-10-CM

## 2019-08-13 DIAGNOSIS — D684 Acquired coagulation factor deficiency: Secondary | ICD-10-CM | POA: Diagnosis present

## 2019-08-13 DIAGNOSIS — K7030 Alcoholic cirrhosis of liver without ascites: Secondary | ICD-10-CM

## 2019-08-13 DIAGNOSIS — K766 Portal hypertension: Secondary | ICD-10-CM | POA: Diagnosis present

## 2019-08-13 DIAGNOSIS — Z95828 Presence of other vascular implants and grafts: Secondary | ICD-10-CM

## 2019-08-13 DIAGNOSIS — K7682 Hepatic encephalopathy: Secondary | ICD-10-CM

## 2019-08-13 LAB — COMPREHENSIVE METABOLIC PANEL
ALT: 30 U/L (ref 0–44)
AST: 99 U/L — ABNORMAL HIGH (ref 15–41)
Albumin: 2.4 g/dL — ABNORMAL LOW (ref 3.5–5.0)
Alkaline Phosphatase: 111 U/L (ref 38–126)
Anion gap: 10 (ref 5–15)
BUN: 10 mg/dL (ref 6–20)
CO2: 28 mmol/L (ref 22–32)
Calcium: 8.3 mg/dL — ABNORMAL LOW (ref 8.9–10.3)
Chloride: 102 mmol/L (ref 98–111)
Creatinine, Ser: 0.3 mg/dL — ABNORMAL LOW (ref 0.44–1.00)
GFR calc Af Amer: >60 mL/min (ref >60)
GFR calc non Af Amer: >60 mL/min (ref >60)
Glucose, Bld: 134 mg/dL — ABNORMAL HIGH (ref 70–99)
Potassium: 3.2 mmol/L — ABNORMAL LOW (ref 3.5–5.1)
Sodium: 140 mmol/L (ref 135–145)
Total Bilirubin: 15.5 mg/dL — ABNORMAL HIGH (ref 0.3–1.2)
Total Protein: 6.7 g/dL (ref 6.5–8.1)

## 2019-08-13 LAB — PROTIME-INR
INR: 2.1 — ABNORMAL HIGH (ref 0.8–1.2)
Prothrombin Time: 22.5 seconds — ABNORMAL HIGH (ref 11.4–15.2)

## 2019-08-13 LAB — CBC WITH DIFFERENTIAL/PLATELET
Abs Immature Granulocytes: 0.02 10*3/uL (ref 0.00–0.07)
Basophils Absolute: 0.1 10*3/uL (ref 0.0–0.1)
Basophils Relative: 2 %
Eosinophils Absolute: 0.2 10*3/uL (ref 0.0–0.5)
Eosinophils Relative: 2 %
HCT: 29.7 % — ABNORMAL LOW (ref 36.0–46.0)
Hemoglobin: 10.4 g/dL — ABNORMAL LOW (ref 12.0–15.0)
Immature Granulocytes: 0 %
Lymphocytes Relative: 22 %
Lymphs Abs: 1.4 10*3/uL (ref 0.7–4.0)
MCH: 34.7 pg — ABNORMAL HIGH (ref 26.0–34.0)
MCHC: 35 g/dL (ref 30.0–36.0)
MCV: 99 fL (ref 80.0–100.0)
Monocytes Absolute: 0.8 10*3/uL (ref 0.1–1.0)
Monocytes Relative: 13 %
Neutro Abs: 3.9 10*3/uL (ref 1.7–7.7)
Neutrophils Relative %: 61 %
Platelets: 72 10*3/uL — ABNORMAL LOW (ref 150–400)
RBC: 3 MIL/uL — ABNORMAL LOW (ref 3.87–5.11)
RDW: 15.3 % (ref 11.5–15.5)
WBC: 6.5 10*3/uL (ref 4.0–10.5)
nRBC: 0 % (ref 0.0–0.2)

## 2019-08-13 LAB — AMMONIA: Ammonia: 80 umol/L — ABNORMAL HIGH (ref 9–35)

## 2019-08-13 MED ORDER — RIFAXIMIN 200 MG PO TABS
200.0000 mg | ORAL_TABLET | Freq: Once | ORAL | Status: AC
  Administered 2019-08-14: 200 mg via ORAL
  Filled 2019-08-13: qty 1

## 2019-08-13 MED ORDER — SODIUM CHLORIDE 0.9 % IV BOLUS
500.0000 mL | Freq: Once | INTRAVENOUS | Status: AC
  Administered 2019-08-13: 500 mL via INTRAVENOUS

## 2019-08-13 MED ORDER — LACTULOSE 10 GM/15ML PO SOLN
30.0000 g | Freq: Once | ORAL | Status: AC
  Administered 2019-08-13: 30 g via ORAL
  Filled 2019-08-13: qty 60

## 2019-08-13 MED ORDER — PROCHLORPERAZINE EDISYLATE 10 MG/2ML IJ SOLN
5.0000 mg | Freq: Once | INTRAMUSCULAR | Status: AC
  Administered 2019-08-13: 5 mg via INTRAVENOUS
  Filled 2019-08-13: qty 2

## 2019-08-13 NOTE — ED Notes (Signed)
Per EMS, pt has normal VS at this time.

## 2019-08-13 NOTE — ED Notes (Signed)
MD at bedside to update patient

## 2019-08-13 NOTE — ED Provider Notes (Signed)
[facility] Emergency Department Provider Note    First MD Initiated Contact with Patient 08/13/19 2204     (approximate)  I have reviewed the triage vital signs and the nursing notes.   HISTORY  Chief Complaint Dizziness    HPI Lisa Dennis is a 54 y.o. female [place] past medical history with alcoholic cirrhosis but persistent alcohol abuse presents to the ER for evaluation of headache confusion generalized weakness and unsteadiness.  Was reportedly recently placed on antibiotics for UTI.  Not having any fevers.  States that she takes lactulose but will go several days without moving her bowels.  Denies any abdominal pain at this time.  No chest pain or shortness of breath.    Past Medical History:  Diagnosis Date  . Alcohol abuse   . Anxiety   . Cirrhosis ([place])    No family history on file. Past Surgical History:  Procedure Laterality Date  . COLONOSCOPY WITH PROPOFOL N/A 04/06/2019   Procedure: COLONOSCOPY WITH PROPOFOL;  Surgeon: Lin Landsman, MD;  Location: [facility] ENDOSCOPY;  Service: Gastroenterology;  Laterality: N/A;  . ESOPHAGOGASTRODUODENOSCOPY (EGD) WITH PROPOFOL N/A 06/08/2018   Procedure: ESOPHAGOGASTRODUODENOSCOPY (EGD) WITH PROPOFOL;  Surgeon: Lucilla Lame, MD;  Location: [facility] ENDOSCOPY;  Service: Endoscopy;  Laterality: N/A;  . ESOPHAGOGASTRODUODENOSCOPY (EGD) WITH PROPOFOL N/A 11/12/2018   Procedure: ESOPHAGOGASTRODUODENOSCOPY (EGD) WITH PROPOFOL;  Surgeon: Jonathon Bellows, MD;  Location: [facility] ENDOSCOPY;  Service: Gastroenterology;  Laterality: N/A;   Patient Active Problem List   Diagnosis Date Noted  . Hepatic encephalopathy ([place]) 06/05/2019  . 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 Rectal bleeding   . Acute GI bleeding 04/03/2019  . Alcohol withdrawal syndrome without complication ([place]) 123XX123  . Bleeding behind the abdominal cavity   . SBP (spontaneous bacterial peritonitis) ([place])    . Intra-abdominal varices   . Hemorrhage, intraperitoneal 11/15/2018  . Alcoholic cirrhosis of liver with ascites ([place]) 11/13/2018  . Alcoholic intoxication with complication ([place]) 123XX123  . Decompensated hepatic cirrhosis ([place]) 08/27/2018  . GI bleeding 06/08/2018  . Alcoholic cirrhosis of liver without ascites ([place])   . Hematemesis without nausea       Prior to Admission medications   Medication Sig Start Date End Date Taking? Authorizing Provider  folic acid (FOLVITE) 1 MG tablet Take 1 tablet (1 mg total) by mouth daily. 01/30/19   Carrie Mew, MD  lactulose (CHRONULAC) 10 GM/15ML solution Take 30 mLs (20 g total) by mouth 3 (three) times daily. Hold for more than 2 loose bowel movements. 06/06/19   Lorella Nimrod, MD  LORazepam (ATIVAN) 0.5 MG tablet Take 0.5 mg by mouth daily as needed for anxiety.  05/20/19   [provider]  Multiple Vitamin (MULTIVITAMIN WITH MINERALS) TABS tablet Take 1 tablet by mouth daily. 05/29/19   Swayze, Ava, DO  nadolol (CORGARD) 20 MG tablet Take 20 mg by mouth daily.    [provider]  oxyCODONE-acetaminophen (PERCOCET) 7.5-325 MG tablet Take 1 tablet by mouth every 6 (six) hours as needed. Patient not taking: Reported on 06/06/2019 06/05/19   Sable Feil, PA-C  pantoprazole (PROTONIX) 40 MG tablet Take 40 mg by mouth daily.    [provider]  thiamine 100 MG tablet Take 1 tablet (100 mg total) by mouth daily. 05/29/19   Swayze, Ava, DO    Allergies Benadryl [diphenhydramine], Venlafaxine hcl er, Penicillins, Clindamycin/lincomycin, Latex, Morphine and related, Sulfa antibiotics, and Tetracyclines & related    Social History  Social History   Tobacco Use  . Smoking status: Former Research scientist (life sciences)  . Smokeless tobacco: Never Used  Substance Use Topics  . Alcohol use: Yes    Alcohol/week: 10.0 standard drinks    Types: 10 Glasses of wine per week    Comment: x 2 drinks today  . Drug use: Never    Review of  Systems Patient denies headaches, rhinorrhea, blurry vision, numbness, shortness of breath, chest pain, edema, cough, abdominal pain, nausea, vomiting, diarrhea, dysuria, fevers, rashes or hallucinations unless otherwise stated above in HPI. ____________________________________________   PHYSICAL EXAM:  VITAL SIGNS: Vitals:   08/13/19 2200 08/13/19 2230  BP: 120/76 111/72  Pulse: 65 69  Resp: 13 14  Temp:    SpO2: 95% 95%    Constitutional: Alert and oriented. jaundiced Eyes: Conjunctivae are normal. Scleral icterus   Head: Atraumatic. Nose: No congestion/rhinnorhea. Mouth/Throat: Mucous membranes are moist.   Neck: No stridor. Painless ROM.  Cardiovascular: Normal rate, regular rhythm. Grossly normal heart sounds.  Good peripheral circulation. Respiratory: Normal respiratory effort.  No retractions. Lungs CTAB. Gastrointestinal: Soft and nontender. No distention. No abdominal bruits. No CVA tenderness. Genitourinary:  Musculoskeletal: No lower extremity tenderness nor edema.  No joint effusions. Neurologic:  Normal speech and language. + asterixis. No gross focal neurologic deficits are appreciated. No facial droop Skin:  Skin is warm, dry and intact. No rash noted. Psychiatric: Mood and affect are normal. Speech and behavior are normal.  ____________________________________________   LABS (all labs ordered are listed, but only abnormal results are displayed)  Results for orders placed or performed during the hospital encounter of 08/13/19 (from the past 24 hour(s))  CBC with Differential/Platelet     Status: Abnormal   Collection Time: 08/13/19 10:10 PM  Result Value Ref Range   WBC 6.5 4.0 - 10.5 K/uL   RBC 3.00 (L) 3.87 - 5.11 MIL/uL   Hemoglobin 10.4 (L) 12.0 - 15.0 g/dL   HCT 29.7 (L) 36.0 - 46.0 %   MCV 99.0 80.0 - 100.0 fL   MCH 34.7 (H) 26.0 - 34.0 pg   MCHC 35.0 30.0 - 36.0 g/dL   RDW 15.3 11.5 - 15.5 %   Platelets 72 (L) 150 - 400 K/uL   nRBC 0.0 0.0 - 0.2  %   Neutrophils Relative % 61 %   Neutro Abs 3.9 1.7 - 7.7 K/uL   Lymphocytes Relative 22 %   Lymphs Abs 1.4 0.7 - 4.0 K/uL   Monocytes Relative 13 %   Monocytes Absolute 0.8 0.1 - 1.0 K/uL   Eosinophils Relative 2 %   Eosinophils Absolute 0.2 0.0 - 0.5 K/uL   Basophils Relative 2 %   Basophils Absolute 0.1 0.0 - 0.1 K/uL   Immature Granulocytes 0 %   Abs Immature Granulocytes 0.02 0.00 - 0.07 K/uL  Comprehensive metabolic panel     Status: Abnormal   Collection Time: 08/13/19 10:10 PM  Result Value Ref Range   Sodium 140 135 - 145 mmol/L   Potassium 3.2 (L) 3.5 - 5.1 mmol/L   Chloride 102 98 - 111 mmol/L   CO2 28 22 - 32 mmol/L   Glucose, Bld 134 (H) 70 - 99 mg/dL   BUN 10 6 - 20 mg/dL   Creatinine, Ser 0.30 (L) 0.44 - 1.00 mg/dL   Calcium 8.3 (L) 8.9 - 10.3 mg/dL   Total Protein 6.7 6.5 - 8.1 g/dL   Albumin 2.4 (L) 3.5 - 5.0 g/dL   AST 99 (H) 15 -  41 U/L   ALT 30 0 - 44 U/L   Alkaline Phosphatase 111 38 - 126 U/L   Total Bilirubin 15.5 (H) 0.3 - 1.2 mg/dL   GFR calc non Af Amer >60 >60 mL/min   GFR calc Af Amer >60 >60 mL/min   Anion gap 10 5 - 15  Protime-INR     Status: Abnormal   Collection Time: 08/13/19 10:10 PM  Result Value Ref Range   Prothrombin Time 22.5 (H) 11.4 - 15.2 seconds   INR 2.1 (H) 0.8 - 1.2  Ammonia     Status: Abnormal   Collection Time: 08/13/19 10:10 PM  Result Value Ref Range   Ammonia 80 (H) 9 - 35 umol/L   ____________________________________________  EKG My review and personal interpretation at Time: 22:00   Indication: weakness  Rate: 70  Rhythm: sinus Axis: normal Other: normal intervals, no stemi ____________________________________________  RADIOLOGY  I personally reviewed all radiographic images ordered to evaluate for the above acute complaints and reviewed radiology reports and findings.  These findings were personally discussed with the patient.  Please see medical record for radiology  report.  ____________________________________________   PROCEDURES  Procedure(s) performed:  .Critical Care Performed by: Merlyn Lot, MD Authorized by: Merlyn Lot, MD   Critical care provider statement:    Critical care time (minutes):  35   Critical care time was exclusive of:  Separately billable procedures and treating other patients   Critical care was necessary to treat or prevent imminent or life-threatening deterioration of the following conditions:  Hepatic failure   Critical care was time spent personally by me on the following activities:  Development of treatment plan with patient or surrogate, discussions with consultants, evaluation of patient's response to treatment, examination of patient, obtaining history from patient or surrogate, ordering and performing treatments and interventions, ordering and review of laboratory studies, ordering and review of radiographic studies, pulse oximetry, re-evaluation of patient's condition and review of old charts      Critical Care performed: yes ____________________________________________   INITIAL IMPRESSION / [place] / ED COURSE  Pertinent labs & imaging results that were available during my care of the patient were reviewed by me and considered in my medical decision making (see chart for details).   DDX: Hepatic encephalopathy, cirrhosis, electrolyte abnormality, sepsis, ICH, migraine, dehydration  Kemani Pursley is a 54 y.o. who presents to the ED with symptoms as described above.  Patient is jaundiced appearing and states his name.  Does have asterixis.  Suspect a component of hepatic encephalopathy.  Blood will be sent for by differential.   Will order neuroimaging as well as chest x-ray.  Her abdominal exam is soft and benign.  Do not suspect SBP or intra-abdominal process.  Clinical Course as of Aug 13 2334  Fri Aug 13, 2019  2314 Blood work does show evidence of worsening T bili  PT/INR and ammonia.  Do suspect some component of hepatic encephalopathy.   [PR]    Clinical Course User Index [PR] Merlyn Lot, MD    The patient was evaluated in Emergency Department today for the symptoms described in the history of present illness. He/she was evaluated in the context of the global COVID-19 pandemic, which necessitated consideration that the patient might be at risk for infection with the SARS-CoV-2 virus that causes COVID-19. Institutional protocols and algorithms that pertain to the evaluation of patients at risk for COVID-19 are in a state of rapid change based on information  released by regulatory bodies including the CDC and federal and state organizations. These policies and algorithms were followed during the patient's care in the ED.  As part of my medical decision making, I reviewed the following data within the [place] notes reviewed and incorporated, Labs reviewed, notes from prior ED visits and [place] Controlled Substance Database   ____________________________________________   FINAL CLINICAL IMPRESSION(S) / ED DIAGNOSES  Final diagnoses:  Hepatic encephalopathy ([place])      NEW MEDICATIONS STARTED DURING THIS VISIT:  New Prescriptions   No medications on file     Note:  This document was prepared using Dragon voice recognition software and may include unintentional dictation errors.    Merlyn Lot, MD 08/13/19 [phone]

## 2019-08-13 NOTE — ED Notes (Signed)
Pt able to ambulate tot he bathroom with assistance but reports feeling dizzy and unsteady. Pt reports she would not feel comfortable ambulating by herself.   Pt transported to CT.

## 2019-08-13 NOTE — ED Notes (Addendum)
Pt c/o headache and weakness since she started taking medications for UTI. Pt c/o edema in lower extremities. Pt is jaundice. Pt states she did drink today and yesterday, states she drinks two glasses of chardonnay/day. Nonpitting edema noted in feet bilaterally.

## 2019-08-13 NOTE — ED Notes (Signed)
Rainbow tops plus dark green and grey tops on ice tops sent to lab.

## 2019-08-13 NOTE — ED Triage Notes (Signed)
Pt arrives via [facility] with c/o dizziness x 5 days after she started two medications due to an UTI. Pt has hx of cirrhosis and appears extremely jaundiced at this time. Pt takes lactulose at home prn and has been taking her medication.

## 2019-08-14 ENCOUNTER — Encounter: Payer: Self-pay | Admitting: Family Medicine

## 2019-08-14 LAB — HEPATIC FUNCTION PANEL
ALT: 30 U/L (ref 0–44)
AST: 97 U/L — ABNORMAL HIGH (ref 15–41)
Albumin: 2.3 g/dL — ABNORMAL LOW (ref 3.5–5.0)
Alkaline Phosphatase: 105 U/L (ref 38–126)
Bilirubin, Direct: 7.2 mg/dL — ABNORMAL HIGH (ref 0.0–0.2)
Indirect Bilirubin: 8.7 mg/dL — ABNORMAL HIGH (ref 0.3–0.9)
Total Bilirubin: 15.9 mg/dL — ABNORMAL HIGH (ref 0.3–1.2)
Total Protein: 6.7 g/dL (ref 6.5–8.1)

## 2019-08-14 LAB — BASIC METABOLIC PANEL
Anion gap: 9 (ref 5–15)
BUN: 9 mg/dL (ref 6–20)
CO2: 26 mmol/L (ref 22–32)
Calcium: 8.1 mg/dL — ABNORMAL LOW (ref 8.9–10.3)
Chloride: 103 mmol/L (ref 98–111)
Creatinine, Ser: <0.3 mg/dL — ABNORMAL LOW (ref 0.44–1.00)
Glucose, Bld: 131 mg/dL — ABNORMAL HIGH (ref 70–99)
Potassium: 3.2 mmol/L — ABNORMAL LOW (ref 3.5–5.1)
Sodium: 138 mmol/L (ref 135–145)

## 2019-08-14 LAB — CBC
HCT: 30.6 % — ABNORMAL LOW (ref 36.0–46.0)
Hemoglobin: 10.4 g/dL — ABNORMAL LOW (ref 12.0–15.0)
MCH: 34.9 pg — ABNORMAL HIGH (ref 26.0–34.0)
MCHC: 34 g/dL (ref 30.0–36.0)
MCV: 102.7 fL — ABNORMAL HIGH (ref 80.0–100.0)
Platelets: 63 10*3/uL — ABNORMAL LOW (ref 150–400)
RBC: 2.98 MIL/uL — ABNORMAL LOW (ref 3.87–5.11)
RDW: 15 % (ref 11.5–15.5)
WBC: 5.3 10*3/uL (ref 4.0–10.5)
nRBC: 0 % (ref 0.0–0.2)

## 2019-08-14 LAB — COMPREHENSIVE METABOLIC PANEL
ALT: 30 U/L (ref 0–44)
AST: 99 U/L — ABNORMAL HIGH (ref 15–41)
Albumin: 2.1 g/dL — ABNORMAL LOW (ref 3.5–5.0)
Alkaline Phosphatase: 93 U/L (ref 38–126)
Anion gap: 6 (ref 5–15)
BUN: 9 mg/dL (ref 6–20)
CO2: 28 mmol/L (ref 22–32)
Calcium: 8.1 mg/dL — ABNORMAL LOW (ref 8.9–10.3)
Chloride: 108 mmol/L (ref 98–111)
Creatinine, Ser: <0.3 mg/dL — ABNORMAL LOW (ref 0.44–1.00)
Glucose, Bld: 139 mg/dL — ABNORMAL HIGH (ref 70–99)
Potassium: 3.6 mmol/L (ref 3.5–5.1)
Sodium: 142 mmol/L (ref 135–145)
Total Bilirubin: 14.4 mg/dL — ABNORMAL HIGH (ref 0.3–1.2)
Total Protein: 6.4 g/dL — ABNORMAL LOW (ref 6.5–8.1)

## 2019-08-14 LAB — LACTIC ACID, PLASMA
Lactic Acid, Venous: 1.8 mmol/L (ref 0.5–1.9)
Lactic Acid, Venous: 2 mmol/L (ref 0.5–1.9)

## 2019-08-14 LAB — MAGNESIUM: Magnesium: 1.3 mg/dL — ABNORMAL LOW (ref 1.7–2.4)

## 2019-08-14 LAB — SARS CORONAVIRUS 2 BY RT PCR (HOSPITAL ORDER, PERFORMED IN ~~LOC~~ HOSPITAL LAB): SARS Coronavirus 2: NEGATIVE

## 2019-08-14 MED ORDER — TRAMADOL HCL 50 MG PO TABS
50.0000 mg | ORAL_TABLET | Freq: Four times a day (QID) | ORAL | Status: DC | PRN
  Administered 2019-08-18: 50 mg via ORAL
  Filled 2019-08-14: qty 1

## 2019-08-14 MED ORDER — PANTOPRAZOLE SODIUM 40 MG PO TBEC
40.0000 mg | DELAYED_RELEASE_TABLET | Freq: Every day | ORAL | Status: DC
  Administered 2019-08-14 – 2019-08-18 (×5): 40 mg via ORAL
  Filled 2019-08-14 (×5): qty 1

## 2019-08-14 MED ORDER — POTASSIUM CHLORIDE 10 MEQ/100ML IV SOLN
10.0000 meq | INTRAVENOUS | Status: DC
  Administered 2019-08-14: 10 meq via INTRAVENOUS
  Filled 2019-08-14: qty 100

## 2019-08-14 MED ORDER — ADULT MULTIVITAMIN W/MINERALS CH: 1.0000 | ORAL_TABLET | Freq: Every day | ORAL | Status: DC

## 2019-08-14 MED ORDER — RIFAXIMIN 200 MG PO TABS
200.0000 mg | ORAL_TABLET | Freq: Two times a day (BID) | ORAL | Status: DC
  Administered 2019-08-14 – 2019-08-15 (×2): 200 mg via ORAL
  Filled 2019-08-14 (×4): qty 1

## 2019-08-14 MED ORDER — ACETAMINOPHEN 650 MG RE SUPP: 650.0000 mg | Freq: Four times a day (QID) | RECTAL | Status: DC | PRN

## 2019-08-14 MED ORDER — FOLIC ACID 1 MG PO TABS
1.0000 mg | ORAL_TABLET | Freq: Every day | ORAL | Status: DC
  Administered 2019-08-14 – 2019-08-18 (×5): 1 mg via ORAL
  Filled 2019-08-14 (×5): qty 1

## 2019-08-14 MED ORDER — ACETAMINOPHEN 325 MG PO TABS: 650.0000 mg | ORAL_TABLET | Freq: Four times a day (QID) | ORAL | Status: DC | PRN

## 2019-08-14 MED ORDER — MAGNESIUM SULFATE 4 GM/100ML IV SOLN
4.0000 g | Freq: Once | INTRAVENOUS | Status: AC
  Administered 2019-08-14: 4 g via INTRAVENOUS
  Filled 2019-08-14: qty 100

## 2019-08-14 MED ORDER — POTASSIUM CHLORIDE IN NACL 20-0.9 MEQ/L-% IV SOLN
INTRAVENOUS | Status: DC
  Filled 2019-08-14 (×5): qty 1000

## 2019-08-14 MED ORDER — THIAMINE HCL 100 MG PO TABS
100.0000 mg | ORAL_TABLET | Freq: Every day | ORAL | Status: DC
  Administered 2019-08-14 – 2019-08-18 (×5): 100 mg via ORAL
  Filled 2019-08-14 (×5): qty 1

## 2019-08-14 MED ORDER — POTASSIUM CHLORIDE CRYS ER 20 MEQ PO TBCR
20.0000 meq | EXTENDED_RELEASE_TABLET | Freq: Once | ORAL | Status: AC
  Administered 2019-08-14: 20 meq via ORAL
  Filled 2019-08-14: qty 1

## 2019-08-14 MED ORDER — ONDANSETRON HCL 4 MG PO TABS: 4.0000 mg | ORAL_TABLET | Freq: Four times a day (QID) | ORAL | Status: DC | PRN

## 2019-08-14 MED ORDER — TRAZODONE HCL 50 MG PO TABS: 25.0000 mg | ORAL_TABLET | Freq: Every evening | ORAL | Status: DC | PRN

## 2019-08-14 MED ORDER — FOLIC ACID 1 MG PO TABS: 1.0000 mg | ORAL_TABLET | Freq: Every day | ORAL | Status: DC

## 2019-08-14 MED ORDER — SODIUM CHLORIDE 0.9 % IV SOLN: INTRAVENOUS | Status: DC

## 2019-08-14 MED ORDER — MAGNESIUM HYDROXIDE 400 MG/5ML PO SUSP: 30.0000 mL | Freq: Every day | ORAL | Status: DC | PRN

## 2019-08-14 MED ORDER — NADOLOL 20 MG PO TABS
20.0000 mg | ORAL_TABLET | Freq: Every day | ORAL | Status: DC
  Administered 2019-08-14 – 2019-08-15 (×2): 20 mg via ORAL
  Filled 2019-08-14 (×2): qty 1

## 2019-08-14 MED ORDER — LORAZEPAM 2 MG/ML IJ SOLN
0.0000 mg | Freq: Two times a day (BID) | INTRAMUSCULAR | Status: DC
  Administered 2019-08-18: 2 mg via INTRAVENOUS
  Filled 2019-08-14: qty 1

## 2019-08-14 MED ORDER — ONDANSETRON HCL 4 MG/2ML IJ SOLN
4.0000 mg | Freq: Four times a day (QID) | INTRAMUSCULAR | Status: DC | PRN
  Administered 2019-08-14: 4 mg via INTRAVENOUS
  Filled 2019-08-14: qty 2

## 2019-08-14 MED ORDER — LORAZEPAM 2 MG/ML IJ SOLN
1.0000 mg | INTRAMUSCULAR | Status: AC | PRN
  Administered 2019-08-15 – 2019-08-17 (×3): 2 mg via INTRAVENOUS
  Administered 2019-08-17: 1 mg via INTRAVENOUS
  Filled 2019-08-14 (×3): qty 1

## 2019-08-14 MED ORDER — LORAZEPAM 2 MG/ML IJ SOLN
0.0000 mg | Freq: Four times a day (QID) | INTRAMUSCULAR | Status: AC
  Administered 2019-08-15 – 2019-08-16 (×3): 1 mg via INTRAVENOUS
  Filled 2019-08-14 (×5): qty 1

## 2019-08-14 MED ORDER — LORAZEPAM 0.5 MG PO TABS
0.5000 mg | ORAL_TABLET | Freq: Every evening | ORAL | Status: DC | PRN
  Administered 2019-08-14: 0.5 mg via ORAL
  Filled 2019-08-14: qty 1

## 2019-08-14 MED ORDER — LACTULOSE 10 GM/15ML PO SOLN
30.0000 g | Freq: Three times a day (TID) | ORAL | Status: DC
  Administered 2019-08-14 – 2019-08-18 (×10): 30 g via ORAL
  Filled 2019-08-14 (×14): qty 60

## 2019-08-14 MED ORDER — ADULT MULTIVITAMIN W/MINERALS CH
1.0000 | ORAL_TABLET | Freq: Every day | ORAL | Status: DC
  Administered 2019-08-14 – 2019-08-18 (×5): 1 via ORAL
  Filled 2019-08-14 (×5): qty 1

## 2019-08-14 MED ORDER — THIAMINE HCL 100 MG/ML IJ SOLN: 100.0000 mg | Freq: Every day | INTRAMUSCULAR | Status: DC

## 2019-08-14 MED ORDER — LORAZEPAM 1 MG PO TABS
1.0000 mg | ORAL_TABLET | ORAL | Status: AC | PRN
  Administered 2019-08-16: 1 mg via ORAL
  Administered 2019-08-17: 2 mg via ORAL
  Filled 2019-08-14: qty 2
  Filled 2019-08-14: qty 1

## 2019-08-14 MED ORDER — THIAMINE HCL 100 MG PO TABS
100.0000 mg | ORAL_TABLET | Freq: Every day | ORAL | Status: DC
Start: 2019-08-14 — End: 2019-08-14

## 2019-08-14 NOTE — Progress Notes (Signed)
PROGRESS NOTE    Lisa Dennis  UKG:[id number] DOB: 15-Jul-1965 DOA: 08/13/2019 PCP: Rusty Aus, MD   Brief Narrative:  Patient is a 54 year old female with history of alcohol abuse, alcohol liver cirrhosis, status post TIPS procedure who came to the emergency room with altered mental status and diagnosed with hepatic encephalopathy. Patient states that she has been taking lactulose as prescribed.  However, she has not had a bowel movement in 4 days until yesterday when she had a large bowel movement after giving additional dose of lactulose in the emergency room.  Patient also has some difficulty quitting alcohol, she had 2 drinks prior to the hospitalization.  Upon arrival in the emergency room, she was given lactulose, rifaximin and IV fluids.   Assessment & Plan:   Active Problems:   Hepatic encephalopathy ([place])  #1.  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 Patient had TIPS procedure, making her prone to have hepatic cephalopathy.  Also she was taking her lactulose as prescribed, she has not had a bowel movements.  She is advised to take additional lactulose or add on MiraLAX if she has no bowel movement in 1 day.  At this point, she will be kept in the hospital for now, continue lactulose and IV fluids.  Most likely will be discharged home tomorrow.  2.  Hypokalemia. Supplemented.  Recheck a BMP and magnesium tomorrow.  3.  Alcoholic liver cirrhosis. Patient has a bilirubin of 15.5.  Prognosis is poor if she continues drink alcohol.  Strongly encourage patient to quit alcohol drinking.  4.  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  No active bleeding.  Will follow.  5.  Macrocytic anemia. Check iron B12 level.  Most likely this is due to splenomegaly.   DVT prophylaxis: SCDs Code Status: Full Family Communication: None in room Disposition Plan:  . Patient came from: Home           . Anticipated d/c place: Home . Barriers to d/c OR  conditions which need to be met to effect a safe d/c:   Consultants:   None  Procedures: None Antimicrobials:None  Subjective: Patient is less confused this morning.  She had a large loose stools after giving additional dose of lactulose.  She denies any short of breath or cough.  She has no abdominal pain or nausea vomiting.  No fever or chills.  Objective: Vitals:   08/14/19 0400 08/14/19 0500 08/14/19 0545 08/14/19 0600  BP: 99/67 100/65  97/72  Pulse: 67 70 70 70  Resp:    15  Temp:      TempSrc:      SpO2:  90% 92% 95%  Weight:      Height:       No intake or output data in the 24 hours ending 08/14/19 1111 Filed Weights   08/13/19 2147 08/13/19 2154  Weight: 56.7 kg 56.7 kg    Examination:  General exam: Appears calm and comfortable  Respiratory system: Clear to auscultation. Respiratory effort normal. Cardiovascular system: S1 & S2 heard, RRR. No JVD, murmurs, rubs, gallops or clicks. No pedal edema. Gastrointestinal system: Abdomen is nondistended, soft and nontender. No organomegaly or masses felt. Normal bowel sounds heard. Central nervous system: Alert and oriented. No focal neurological deficits. Extremities: Symmetric 5 x 5 power. Skin: No rashes, lesions or ulcers Psychiatry: Judgement and insight appear normal. Mood & affect appropriate.     Data Reviewed: I have personally reviewed following labs and imaging studies  CBC: Recent  Labs  Lab 08/13/19 2210 08/14/19 0324  WBC 6.5 5.3  NEUTROABS 3.9  --   HGB 10.4* 10.4*  HCT 29.7* 30.6*  MCV 99.0 102.7*  PLT 72* 63*   Basic Metabolic Panel: Recent Labs  Lab 08/13/19 2210 08/14/19 0324  NA 140 142  K 3.2* 3.6  CL 102 108  CO2 28 28  GLUCOSE 134* 139*  BUN 10 9  CREATININE 0.30* <0.30*  CALCIUM 8.3* 8.1*   GFR: CrCl cannot be calculated (This lab value cannot be used to calculate CrCl because it is not a number: <0.30). Liver Function Tests: Recent Labs  Lab 08/13/19 2210  08/14/19 0324  AST 99* 99*  ALT 30 30  ALKPHOS 111 93  BILITOT 15.5* 14.4*  PROT 6.7 6.4*  ALBUMIN 2.4* 2.1*   No results for input(s): LIPASE, AMYLASE in the last 168 hours. Recent Labs  Lab 08/13/19 2210  AMMONIA 80*   Coagulation Profile: Recent Labs  Lab 08/13/19 2210  INR 2.1*   Cardiac Enzymes: No results for input(s): CKTOTAL, CKMB, CKMBINDEX, TROPONINI in the last 168 hours. BNP (last 3 results) No results for input(s): PROBNP in the last 8760 hours. HbA1C: No results for input(s): HGBA1C in the last 72 hours. CBG: No results for input(s): GLUCAP in the last 168 hours. Lipid Profile: No results for input(s): CHOL, HDL, LDLCALC, TRIG, CHOLHDL, LDLDIRECT in the last 72 hours. Thyroid Function Tests: No results for input(s): TSH, T4TOTAL, FREET4, T3FREE, THYROIDAB in the last 72 hours. Anemia Panel: No results for input(s): VITAMINB12, FOLATE, FERRITIN, TIBC, IRON, RETICCTPCT in the last 72 hours. Sepsis Labs: No results for input(s): PROCALCITON, LATICACIDVEN in the last 168 hours.  Recent Results (from the past 240 hour(s))  SARS Coronavirus 2 by RT PCR (hospital order, performed in [facility] hospital lab) Nasopharyngeal Nasopharyngeal Swab     Status: None   Collection Time: 08/13/19 11:40 PM   Specimen: Nasopharyngeal Swab  Result Value Ref Range Status   SARS Coronavirus 2 NEGATIVE NEGATIVE Final    Comment: (NOTE) SARS-CoV-2 target nucleic acids are NOT DETECTED. The SARS-CoV-2 RNA is generally detectable in upper and lower respiratory specimens during the acute phase of infection. The lowest concentration of SARS-CoV-2 viral copies this assay can detect is 250 copies / mL. A negative result does not preclude SARS-CoV-2 infection and should not be used as the sole basis for treatment or other patient management decisions.  A negative result may occur with improper specimen collection / handling, submission of specimen other than nasopharyngeal swab,  presence of viral mutation(s) within the areas targeted by this assay, and inadequate number of viral copies (<250 copies / mL). A negative result must be combined with clinical observations, patient history, and epidemiological information. Fact Sheet for Patients:   StrictlyIdeas.no Fact Sheet for Healthcare Providers: BankingDealers.co.za This test is not yet approved or cleared  by the [place] FDA and has been authorized for detection and/or diagnosis of SARS-CoV-2 by FDA under an Emergency Use Authorization (EUA).  This EUA will remain in effect (meaning this test can be used) for the duration of the COVID-19 declaration under Section 564(b)(1) of the Act, 21 U.S.C. section 360bbb-3(b)(1), unless the authorization is terminated or revoked sooner. Performed at [facility], [address] [place], [place] [address]          Radiology Studies: CT Head Wo Contrast  Result Date: 08/13/2019 CLINICAL DATA:  54 year old female with headache and weakness, lower extremity edema, jaundice. EXAM:  CT HEAD WITHOUT CONTRAST TECHNIQUE: Contiguous axial images were obtained from the base of the skull through the vertex without intravenous contrast. COMPARISON:  Head CT 06/16/2019. FINDINGS: Brain: Stable age advanced generalized cerebral volume loss. No midline shift, ventriculomegaly, mass effect, evidence of mass lesion, intracranial hemorrhage or evidence of cortically based acute infarction. Mild for age white matter changes. No cortical encephalomalacia identified. Vascular: Mild Calcified atherosclerosis at the skull base. No suspicious intracranial vascular hyperdensity. Skull: No acute osseous abnormality identified. Sinuses/Orbits: Visualized paranasal sinuses and mastoids are stable and well pneumatized. Other: Visualized orbits and scalp soft tissues are within normal limits. IMPRESSION: Stable generalized cerebral volume loss  which is advanced for age. No acute intracranial abnormality. Electronically Signed   By: Genevie Ann M.D.   On: 08/13/2019 23:21   DG Chest Portable 1 View  Result Date: 08/13/2019 CLINICAL DATA:  Dizziness x5 days. EXAM: PORTABLE CHEST 1 VIEW COMPARISON:  November 12, 2018 FINDINGS: Mild, chronic appearing increased lung markings are seen without evidence of acute infiltrate, pleural effusion or pneumothorax. The cardiac silhouette is borderline in size and stable in appearance. The visualized skeletal structures are unremarkable. IMPRESSION: No active disease. Electronically Signed   By: Virgina Norfolk M.D.   On: 08/13/2019 23:03        Scheduled Meds: . folic acid  1 mg Oral Daily  . lactulose  30 g Oral TID  . multivitamin with minerals  1 tablet Oral Daily  . nadolol  20 mg Oral Daily  . pantoprazole  40 mg Oral Daily  . rifaximin  200 mg Oral BID  . thiamine  100 mg Oral Daily   Continuous Infusions: . sodium chloride 50 mL/hr at 08/14/19 0223     LOS: 1 day    Time spent: 25 mins    Sharen Hones, MD [facility]   To contact the attending provider between 7A-7P or the covering provider during after hours 7P-7A, please log into the web site [email or web address] and access using universal [place] password for that web site. If you do not have the password, please call the hospital operator.  08/14/2019, 11:11 AM

## 2019-08-14 NOTE — H&P (Signed)
[facility] [place] NAME: Lisa Dennis    MR#:  [id number]  DATE OF BIRTH:  [birth year]  DATE OF ADMISSION:  08/13/2019  PRIMARY CARE PHYSICIAN: Rusty Aus, MD   REQUESTING/REFERRING PHYSICIAN: Merlyn Lot, MD  CHIEF COMPLAINT:   Chief Complaint  Patient presents with  . Dizziness    HISTORY OF PRESENT ILLNESS:  Lisa Dennis  is a 54 y.o. female with a known history of alcohol abuse and alcoholic cirrhosis, who presented to the emergency room with acute onset of altered mental status with confusion and excessive sleepiness and dizziness which have been going on for 4 days with associated nausea without vomiting or diarrhea or abdominal pain.  No cough or dyspnea.  She has occasional wheezing.  She denied any chest pain or palpitations.  She admits to urinary frequency and urgency without dysuria or hematuria or flank pain.  It is unclear if she takes her lactulose on a daily basis or more frequent.  She stated that she stopped taking rifaximin for a while since she believes that it has been giving her headache.  Upon presentation to the emergency room, blood pressure was 155/97 with heart rate of 53 with otherwise normal vital signs.  CMP revealed hypokalemia of 3.2 and AST of 99 and ammonia level came back 80.  Albumin was 2.4 and total PT was 6.7.  CBC showed anemia slightly worse than last month.  INR was 2.1 and PT 22.5.  Noncontrasted head CT scan showed stable generalized cerebral volume loss that is advanced to her age with no acute intracranial abnormality.  Portable chest x-ray showed no acute cardiopulmonary disease. EKG showed normal sinus rhythm with a rate of 70 with nonspecific intraventricular conduction delay and prolonged PR interval.  The patient was given 30 g of p.o. lactulose, 5 mg IV Compazine, 200 mg of p.o. rifaximin and 500 mL IV normal saline bolus.  She will be admitted to a medically monitored bed for further  evaluation and management. PAST MEDICAL HISTORY:   Past Medical History:  Diagnosis Date  . Alcohol abuse   . Anxiety   . Cirrhosis ([place])     PAST SURGICAL HISTORY:   Past Surgical History:  Procedure Laterality Date  . COLONOSCOPY WITH PROPOFOL N/A 04/06/2019   Procedure: COLONOSCOPY WITH PROPOFOL;  Surgeon: Lin Landsman, MD;  Location: [facility] ENDOSCOPY;  Service: Gastroenterology;  Laterality: N/A;  . ESOPHAGOGASTRODUODENOSCOPY (EGD) WITH PROPOFOL N/A 06/08/2018   Procedure: ESOPHAGOGASTRODUODENOSCOPY (EGD) WITH PROPOFOL;  Surgeon: Lucilla Lame, MD;  Location: [facility] ENDOSCOPY;  Service: Endoscopy;  Laterality: N/A;  . ESOPHAGOGASTRODUODENOSCOPY (EGD) WITH PROPOFOL N/A 11/12/2018   Procedure: ESOPHAGOGASTRODUODENOSCOPY (EGD) WITH PROPOFOL;  Surgeon: Jonathon Bellows, MD;  Location: [facility] ENDOSCOPY;  Service: Gastroenterology;  Laterality: N/A;    SOCIAL HISTORY:   Social History   Tobacco Use  . Smoking status: Former Research scientist (life sciences)  . Smokeless tobacco: Never Used  Substance Use Topics  . Alcohol use: Yes    Alcohol/week: 10.0 standard drinks    Types: 10 Glasses of wine per week    Comment: x 2 drinks today    FAMILY HISTORY:  No family history on file.  No reported familial diseases.  DRUG ALLERGIES:   Allergies  Allergen Reactions  . Benadryl [Diphenhydramine] Other (See Comments)    Altered Mental Status  . Venlafaxine Hcl Er Itching  . Penicillins Hives and Other (See Comments)    Did it involve swelling of the face/tongue/throat,  SOB, or low BP? No Did it involve sudden or severe rash/hives, skin peeling, or any reaction on the inside of your mouth or nose? Unknown Did you need to seek medical attention at a hospital or doctor's office? Unknown When did it last happen?unknown If all above answers are "NO", may proceed with cephalosporin use.  **patient tolerates ceftriaxone - Aug 2020   . Clindamycin/Lincomycin Swelling  . Latex Hives  . Morphine And Related Hives   . Sulfa Antibiotics Hives  . Tetracyclines & Related Hives    REVIEW OF SYSTEMS:   ROS As per history of present illness. All pertinent systems were reviewed above. Constitutional,  HEENT, cardiovascular, respiratory, GI, GU, musculoskeletal, neuro, psychiatric, endocrine,  integumentary and hematologic systems were reviewed and are otherwise  negative/unremarkable except for positive findings mentioned above in the HPI.   MEDICATIONS AT HOME:   Prior to Admission medications   Medication Sig Start Date End Date Taking? Authorizing Provider  ciprofloxacin (CIPRO) 250 MG tablet Take 250 mg by mouth 2 (two) times daily. 07/30/19   [provider]  folic acid (FOLVITE) 1 MG tablet Take 1 tablet (1 mg total) by mouth daily. 01/30/19   Carrie Mew, MD  lactulose (CHRONULAC) 10 GM/15ML solution Take 30 mLs (20 g total) by mouth 3 (three) times daily. Hold for more than 2 loose bowel movements. 06/06/19   Lorella Nimrod, MD  LORazepam (ATIVAN) 0.5 MG tablet Take 0.5 mg by mouth daily as needed for anxiety.  05/20/19   [provider]  Multiple Vitamin (MULTIVITAMIN WITH MINERALS) TABS tablet Take 1 tablet by mouth daily. 05/29/19   Swayze, Ava, DO  nadolol (CORGARD) 20 MG tablet Take 20 mg by mouth daily.    [provider]  oxyCODONE-acetaminophen (PERCOCET) 7.5-325 MG tablet Take 1 tablet by mouth every 6 (six) hours as needed. Patient not taking: Reported on 06/06/2019 06/05/19   Sable Feil, PA-C  pantoprazole (PROTONIX) 40 MG tablet Take 40 mg by mouth daily.    [provider]  thiamine 100 MG tablet Take 1 tablet (100 mg total) by mouth daily. 05/29/19   Swayze, Ava, DO      VITAL SIGNS:  Blood pressure 111/72, pulse 69, temperature 98.1 F (36.7 C), temperature source Oral, resp. rate 14, height 5\' 2"  (1.575 m), weight 56.7 kg, SpO2 95 %.  PHYSICAL EXAMINATION:  Physical Exam  GENERAL:  54 y.o.-year-old female patient lying in the bed with no  acute distress.  She was fairly somnolent but easily arousable. EYES: Pupils equal, round, reactive to light and accommodation. No scleral icterus. Extraocular muscles intact.  HEENT: Head atraumatic, normocephalic. Oropharynx and nasopharynx clear.  NECK:  Supple, no jugular venous distention. No thyroid enlargement, no tenderness.  LUNGS: Normal breath sounds bilaterally, no wheezing, rales,rhonchi or crepitation. No use of accessory muscles of respiration.  CARDIOVASCULAR: Regular rate and rhythm, S1, S2 normal. No murmurs, rubs, or gallops.  ABDOMEN: Soft, nondistended, nontender. Bowel sounds present. No organomegaly or mass.  EXTREMITIES: No pedal edema, cyanosis, or clubbing.  NEUROLOGIC: Cranial nerves II through XII are intact. Muscle strength 5/5 in all extremities. Sensation intact. Gait not checked.  PSYCHIATRIC: The patient is alert and oriented x 3.  Normal affect and good eye contact. SKIN: No obvious rash, lesion, or ulcer.   LABORATORY PANEL:   CBC Recent Labs  Lab 08/13/19 2210  WBC 6.5  HGB 10.4*  HCT 29.7*  PLT 72*   ------------------------------------------------------------------------------------------------------------------  Chemistries  Recent Labs  Lab 08/13/19 2210  NA 140  K 3.2*  CL 102  CO2 28  GLUCOSE 134*  BUN 10  CREATININE 0.30*  CALCIUM 8.3*  AST 99*  ALT 30  ALKPHOS 111  BILITOT 15.5*   ------------------------------------------------------------------------------------------------------------------  Cardiac Enzymes No results for input(s): TROPONINI in the last 168 hours. ------------------------------------------------------------------------------------------------------------------  RADIOLOGY:  CT Head Wo Contrast  Result Date: 08/13/2019 CLINICAL DATA:  54 year old female with headache and weakness, lower extremity edema, jaundice. EXAM: CT HEAD WITHOUT CONTRAST TECHNIQUE: Contiguous axial images were obtained from the base  of the skull through the vertex without intravenous contrast. COMPARISON:  Head CT 06/16/2019. FINDINGS: Brain: Stable age advanced generalized cerebral volume loss. No midline shift, ventriculomegaly, mass effect, evidence of mass lesion, intracranial hemorrhage or evidence of cortically based acute infarction. Mild for age white matter changes. No cortical encephalomalacia identified. Vascular: Mild Calcified atherosclerosis at the skull base. No suspicious intracranial vascular hyperdensity. Skull: No acute osseous abnormality identified. Sinuses/Orbits: Visualized paranasal sinuses and mastoids are stable and well pneumatized. Other: Visualized orbits and scalp soft tissues are within normal limits. IMPRESSION: Stable generalized cerebral volume loss which is advanced for age. No acute intracranial abnormality. Electronically Signed   By: Genevie Ann M.D.   On: 08/13/2019 23:21   DG Chest Portable 1 View  Result Date: 08/13/2019 CLINICAL DATA:  Dizziness x5 days. EXAM: PORTABLE CHEST 1 VIEW COMPARISON:  November 12, 2018 FINDINGS: Mild, chronic appearing increased lung markings are seen without evidence of acute infiltrate, pleural effusion or pneumothorax. The cardiac silhouette is borderline in size and stable in appearance. The visualized skeletal structures are unremarkable. IMPRESSION: No active disease. Electronically Signed   By: Virgina Norfolk M.D.   On: 08/13/2019 23:03      IMPRESSION AND PLAN:   1.  Hepatic encephalopathy with associated alcoholic cirrhosis. -The patient will be admitted to a medical monitored bed. -We will continue with p.o. lactulose 30 g 3 times daily. -We will follow daily ammonia level. -We will place her on rifaximin twice daily.  2.  Hypokalemia. -We will replace potassium and check magnesium level.  3.  Alcoholic liver cirrhosis with liver cell failure and coagulopathy. -We will continue rifaximin and lactulose as mentioned above. -We will follow her LFTs  and INR with PT.  4.  Alcohol abuse. -She was counseled for cessation. -We will place on as needed Ativan. -We will get her on scheduled thiamine, folic acid and multivitamins.  5.  DVT prophylaxis. -SCDs. -Medical prophylaxis currently contraindicated due to coagulopathy.  All the records are reviewed and case discussed with ED provider. The plan of care was discussed in details with the patient (and family). I answered all questions. The patient agreed to proceed with the above mentioned plan. Further management will depend upon hospital course.   CODE STATUS: Full code  Status is: Inpatient  Remains inpatient appropriate because:Altered mental status, Ongoing diagnostic testing needed not appropriate for outpatient work up, Unsafe d/c plan, IV treatments appropriate due to intensity of illness or inability to take PO and Inpatient level of care appropriate due to severity of illness   Dispo: The patient is from: Home              Anticipated d/c is to: Home              Anticipated d/c date is: 2 days              Patient currently is not medically stable to d/c.  TOTAL TIME TAKING CARE OF THIS PATIENT: 55 minutes.    Christel Mormon M.D on 08/14/2019 at 12:52 AM  [facility]   From 7 PM-7 AM, contact night-coverage [email or web address]  CC: Primary care physician; Rusty Aus, MD   Note: This dictation was prepared with Dragon dictation along with smaller phrase technology. Any transcriptional errors that result from this process are unintentional.

## 2019-08-14 NOTE — ED Notes (Signed)
Bedside commode provided. Warm blanket given

## 2019-08-14 NOTE — Progress Notes (Signed)
Cross Cover Brief Note Patient reported to RN she felt like she was going through withdrawal Bedside patient states she drinks 2-3 alcoholic beverages a day.  She has experienced withdrawal in the past "a long time ago". She denmies ever experiencing seizures with alcohol abstinence.  She takes prn ativan at home Currently she states her symptoms include feeling tense and hot an cold.  She has been placed and withdrawal protocol

## 2019-08-14 NOTE — ED Notes (Signed)
Pt using bedside commode without assistance. Stool loose with yellow tinge

## 2019-08-14 NOTE — ED Notes (Signed)
Pt sleeping; no distress noted

## 2019-08-15 LAB — URINALYSIS, COMPLETE (UACMP) WITH MICROSCOPIC: Specific Gravity, Urine: 1.016 (ref 1.005–1.030)

## 2019-08-15 LAB — PHOSPHORUS: Phosphorus: UNDETERMINED mg/dL (ref 2.5–4.6)

## 2019-08-15 LAB — HEPATIC FUNCTION PANEL
ALT: 28 U/L (ref 0–44)
AST: 87 U/L — ABNORMAL HIGH (ref 15–41)
Albumin: 2.1 g/dL — ABNORMAL LOW (ref 3.5–5.0)
Alkaline Phosphatase: 101 U/L (ref 38–126)
Bilirubin, Direct: 7.4 mg/dL — ABNORMAL HIGH (ref 0.0–0.2)
Indirect Bilirubin: 9 mg/dL — ABNORMAL HIGH (ref 0.3–0.9)
Total Bilirubin: 16.4 mg/dL — ABNORMAL HIGH (ref 0.3–1.2)
Total Protein: 6.2 g/dL — ABNORMAL LOW (ref 6.5–8.1)

## 2019-08-15 LAB — BASIC METABOLIC PANEL
Anion gap: 5 (ref 5–15)
BUN: 9 mg/dL (ref 6–20)
CO2: 27 mmol/L (ref 22–32)
Calcium: 8.5 mg/dL — ABNORMAL LOW (ref 8.9–10.3)
Chloride: 106 mmol/L (ref 98–111)
Creatinine, Ser: <0.3 mg/dL — ABNORMAL LOW (ref 0.44–1.00)
Glucose, Bld: 119 mg/dL — ABNORMAL HIGH (ref 70–99)
Potassium: 4.6 mmol/L (ref 3.5–5.1)
Sodium: 138 mmol/L (ref 135–145)

## 2019-08-15 LAB — HEPATITIS B SURFACE ANTIGEN: Hepatitis B Surface Ag: NONREACTIVE

## 2019-08-15 LAB — AMMONIA: Ammonia: 68 umol/L — ABNORMAL HIGH (ref 9–35)

## 2019-08-15 LAB — VITAMIN B12: Vitamin B-12: 1436 pg/mL — ABNORMAL HIGH (ref 180–914)

## 2019-08-15 LAB — PROTIME-INR
INR: 2.1 — ABNORMAL HIGH (ref 0.8–1.2)
Prothrombin Time: 22.6 seconds — ABNORMAL HIGH (ref 11.4–15.2)

## 2019-08-15 LAB — MAGNESIUM: Magnesium: 1.4 mg/dL — ABNORMAL LOW (ref 1.7–2.4)

## 2019-08-15 MED ORDER — ENSURE ENLIVE PO LIQD
237.0000 mL | Freq: Two times a day (BID) | ORAL | Status: DC
  Administered 2019-08-15 – 2019-08-18 (×8): 237 mL via ORAL

## 2019-08-15 MED ORDER — RIFAXIMIN 550 MG PO TABS
550.0000 mg | ORAL_TABLET | Freq: Two times a day (BID) | ORAL | Status: DC
  Administered 2019-08-15 – 2019-08-18 (×6): 550 mg via ORAL
  Filled 2019-08-15 (×8): qty 1

## 2019-08-15 MED ORDER — VITAMIN K1 10 MG/ML IJ SOLN
10.0000 mg | Freq: Once | INTRAMUSCULAR | Status: AC
  Administered 2019-08-15: 10 mg via SUBCUTANEOUS
  Filled 2019-08-15: qty 1

## 2019-08-15 MED ORDER — SODIUM CHLORIDE 0.9 % IV SOLN
1.0000 g | INTRAVENOUS | Status: AC
  Administered 2019-08-15 – 2019-08-17 (×3): 1 g via INTRAVENOUS
  Filled 2019-08-15 (×3): qty 1
  Filled 2019-08-15: qty 10

## 2019-08-15 MED ORDER — MAGNESIUM SULFATE 4 GM/100ML IV SOLN
4.0000 g | Freq: Once | INTRAVENOUS | Status: AC
  Administered 2019-08-15: 4 g via INTRAVENOUS
  Filled 2019-08-15 (×2): qty 100

## 2019-08-15 MED ORDER — PREDNISOLONE 15 MG/5ML PO SOLN
40.0000 mg | Freq: Every day | ORAL | Status: DC
  Administered 2019-08-15 – 2019-08-18 (×4): 40 mg via ORAL
  Filled 2019-08-15 (×2): qty 15
  Filled 2019-08-15 (×2): qty 13.33

## 2019-08-15 NOTE — Consult Note (Signed)
GI Inpatient Consult Note  Reason for Consult: Alcoholic hepatitis, hepatic encephalopathy   Attending Requesting Consult: Dr. Sharen Hones, MD  History of Present Illness: Lisa Dennis is a 54 y.o. female seen for evaluation of alcoholic hepatitis and hepatic encephalopathy at the request of Dr. Roosevelt Locks. 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 She presented to the [facility] ED 05/21 for acute onset of altered mental status associated with confusion, hypersomnolence, dizziness, and nausea ongoing over the past four days. She reports she has been taking her lactulose as prescribed over this time, but had self d/c Rifaximin because of side effect of headache. She had not had a bowel movement in 4 days since coming to the ED. Upon presentation to the ED, ammonia level elevated at 80, platelets 72K, AST 99, ALT 30, alk phos 11, total bilirubin 15.5 (direct component 7.2), and elevated INR at 2.1. She was given Lactulose 30 mL PO, IV compazine, IV fluid bolus normal saline, and Rifaximin 200 mg PO. She did have a bowel movement in the ED after receiving lactulose. She reported to ED staff that she did have 2 alcoholic drinks prior to hospitalization. She was followed by Dr. Roosevelt Locks yesterday and GI was consulted this morning due to increased hyperbilirubinemia with total bilirubin 16.4. Patient reports she is feeling more alert this morning. She has had 3 loose BMs this morning with no overt hematochezia or melena. She reports she follows with Dr. Posey Pronto at [facility] and is scheduled to see him on 05/28 in follow-up. She reports she takes Ativan daily to help reduce the urgency to drink alcohol, but this isn't very effective. She drinks 1-2 glasses of Chardonnay daily. She lives by herself and is not currently employed. She reports some mild nausea without any emesis. She denies any issues with abdominal ascites since having her TIPS procedure  last September. She denies any abdominal pain, pruritus, or fatigue.    Last Colonoscopy: 03/2019 ([place]) - skin tags found on perianal exam - normal terminal ileum - solitary 5 mm ulcer found in distal rectum with stigmata of recent bleeding, likely stercoral ulcer in setting of constipation - patchy area of scattered mildly erythematous mucosal areas found in distal rectum - no evidence of rectal varices or large hemorrhoids   Last Endoscopy: 10/2018 [facility]) - Grade I varices with no red wale signs - moderate portal hypertensive gastropathy - normal examined duodenum    Past Medical History:  Past Medical History:  Diagnosis Date  . Alcohol abuse   . Anxiety   . Cirrhosis ([place])     Problem List: Patient Active Problem List   Diagnosis Date Noted  . Hepatic encephalopathy ([place]) 06/05/2019  . 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 Rectal bleeding   . Acute GI bleeding 04/03/2019  . Alcohol withdrawal syndrome without complication ([place]) 94/70/9628  . Bleeding behind the abdominal cavity   . SBP (spontaneous bacterial peritonitis) ([place])   . Intra-abdominal varices   . Hemorrhage, intraperitoneal 11/15/2018  . Alcoholic cirrhosis of liver with ascites ([place]) 11/13/2018  . Alcoholic intoxication with complication ([place]) 36/62/9476  . Decompensated hepatic cirrhosis ([place]) 08/27/2018  . GI bleeding 06/08/2018  . Alcoholic cirrhosis of liver without ascites ([place])   . Hematemesis without nausea     Past Surgical History: Past Surgical History:  Procedure Laterality Date  . COLONOSCOPY WITH PROPOFOL N/A 04/06/2019   Procedure: COLONOSCOPY WITH PROPOFOL;  Surgeon: Lin Landsman, MD;  Location: [facility] ENDOSCOPY;  Service: Gastroenterology;  Laterality: N/A;  . ESOPHAGOGASTRODUODENOSCOPY (EGD) WITH PROPOFOL N/A 06/08/2018   Procedure: ESOPHAGOGASTRODUODENOSCOPY (EGD) WITH PROPOFOL;  Surgeon: Lucilla Lame, MD;  Location: [facility] ENDOSCOPY;  Service:  Endoscopy;  Laterality: N/A;  . ESOPHAGOGASTRODUODENOSCOPY (EGD) WITH PROPOFOL N/A 11/12/2018   Procedure: ESOPHAGOGASTRODUODENOSCOPY (EGD) WITH PROPOFOL;  Surgeon: Jonathon Bellows, MD;  Location: [facility] ENDOSCOPY;  Service: Gastroenterology;  Laterality: N/A;    Allergies: Allergies  Allergen Reactions  . Benadryl [Diphenhydramine] Other (See Comments)    Altered Mental Status  . Venlafaxine Hcl Er Itching  . Penicillins Hives and Other (See Comments)    Did it involve swelling of the face/tongue/throat, SOB, or low BP? No Did it involve sudden or severe rash/hives, skin peeling, or any reaction on the inside of your mouth or nose? Unknown Did you need to seek medical attention at a hospital or doctor's office? Unknown When did it last happen?unknown If all above answers are "NO", may proceed with cephalosporin use.  **patient tolerates ceftriaxone - Aug 2020   . Clindamycin/Lincomycin Swelling  . Latex Hives  . Morphine And Related Hives  . Sulfa Antibiotics Hives  . Tetracyclines & Related Hives    Home Medications: Medications Prior to Admission  Medication Sig Dispense Refill Last Dose  . ciprofloxacin (CIPRO) 250 MG tablet Take 250 mg by mouth 2 (two) times daily.   Unknown at Unknown  . folic acid (FOLVITE) 1 MG tablet Take 1 tablet (1 mg total) by mouth daily. 90 tablet 0 08/13/2019 at Unknown time  . lactulose (CHRONULAC) 10 GM/15ML solution Take 30 mLs (20 g total) by mouth 3 (three) times daily. Hold for more than 2 loose bowel movements. 236 mL 0 08/13/2019 at Unknown time  . LORazepam (ATIVAN) 0.5 MG tablet Take 0.5 mg by mouth daily as needed for anxiety.    08/13/2019 at Unknown time  . Multiple Vitamin (MULTIVITAMIN WITH MINERALS) TABS tablet Take 1 tablet by mouth daily. 30 tablet 0 Past Week at Unknown time  . nadolol (CORGARD) 20 MG tablet Take 20 mg by mouth daily.   08/13/2019 at Unknown time  . pantoprazole (PROTONIX) 40 MG tablet Take 40 mg by mouth daily.   08/13/2019  at Unknown time  . thiamine 100 MG tablet Take 1 tablet (100 mg total) by mouth daily. 30 tablet 0 Past Week at Unknown time  . oxyCODONE-acetaminophen (PERCOCET) 7.5-325 MG tablet Take 1 tablet by mouth every 6 (six) hours as needed. (Patient not taking: Reported on 06/06/2019) 12 tablet 0 Not Taking at Unknown time   Home medication reconciliation was completed with the patient.   Scheduled Inpatient Medications:   . feeding supplement (ENSURE ENLIVE)  237 mL Oral BID BM  . folic acid  1 mg Oral Daily  . lactulose  30 g Oral TID  . LORazepam  0-4 mg Intravenous Q6H   Followed by  . [START ON 08/16/2019] LORazepam  0-4 mg Intravenous Q12H  . multivitamin with minerals  1 tablet Oral Daily  . nadolol  20 mg Oral Daily  . pantoprazole  40 mg Oral Daily  . rifaximin  200 mg Oral BID  . thiamine  100 mg Oral Daily    Continuous Inpatient Infusions:   . 0.9 % NaCl with KCl 20 mEq / L 50 mL/hr at 08/15/19 0500  . magnesium sulfate bolus IVPB      PRN Inpatient Medications:  LORazepam **OR** LORazepam, magnesium hydroxide, ondansetron **  OR** ondansetron (ZOFRAN) IV, traMADol  Family History: family history is not on file.  The patient's family history is negative for inflammatory bowel disorders, GI malignancy, or solid organ transplantation.  Social History:   reports that she has quit smoking. She has never used smokeless tobacco. She reports current alcohol use of about 10.0 standard drinks of alcohol per week. She reports that she does not use drugs. The patient denies ETOH, tobacco, or drug use.   Review of Systems: Constitutional: Weight is stable.  Eyes: No changes in vision. ENT: No oral lesions, sore throat.  GI: see HPI.  Heme/Lymph: No easy bruising.  CV: No chest pain.  GU: No hematuria.  Integumentary: No rashes.  Neuro: No headaches.  Psych: No depression/anxiety.  Endocrine: No heat/cold intolerance.  Allergic/Immunologic: No urticaria.  Resp: No cough, SOB.   Musculoskeletal: No joint swelling.    Physical Examination: BP 125/69 (BP Location: Left Arm)   Pulse 75   Temp 98.9 F (37.2 C) (Oral)   Resp 18   Ht _0  (1.575 m)   Wt 56.7 kg   SpO2 95%   BMI 22.86 kg/m  Gen: NAD, alert and oriented x 4 HEENT: PEERLA, EOMI, scleral icterus Neck: supple, no JVD or thyromegaly Chest: CTA bilaterally, no wheezes, crackles, or other adventitious sounds CV: RRR, no m/g/c/r Abd: soft, NT, ND, +BS in all four quadrants; no HSM, guarding, ridigity, or rebound tenderness Ext: no edema, well perfused with 2+ pulses, Skin: no rash or lesions noted Lymph: no LAD  Data: Lab Results  Component Value Date   WBC 5.3 08/14/2019   HGB 10.4 (L) 08/14/2019   HCT 30.6 (L) 08/14/2019   MCV 102.7 (H) 08/14/2019   PLT 63 (L) 08/14/2019   Recent Labs  Lab 08/13/19 2210 08/14/19 0324  HGB 10.4* 10.4*   Lab Results  Component Value Date   NA 138 08/15/2019   K 4.6 08/15/2019   CL 106 08/15/2019   CO2 27 08/15/2019   BUN 9 08/15/2019   CREATININE <0.30 (L) 08/15/2019   Lab Results  Component Value Date   ALT 28 08/15/2019   AST 87 (H) 08/15/2019   ALKPHOS 101 08/15/2019   BILITOT 16.4 (H) 08/15/2019   Recent Labs  Lab 08/13/19 2210  INR 2.1*   Assessment/Plan:  54 y/o female with a PMH of alcoholic cirrhosis s/p TIPS procedure last year with ongoing alcohol abuse and anxiety admitted for hepatic encephalopathy and alcoholic hepatitis  1. Alcoholic cirrhosis of the liver with ongoing alcohol abuse  2. Alcoholic hepatitis - Maddrey discriminant function 55.5 suggests severe alcoholic hepatitis with poor prognosis  3. Hepatic encephalopathy - likely exacerbated by TIPS 11/2018 and medication noncompliance. No overt signs of severe encephalopathy today.  4. Coagulopathy - INR 2.1, PT 22.5   Recommendations:  1. Maddrey DF 55.5 >32 indicates patient will benefit from glucocorticoid therapy. We will check Hep B surface antigen to  exclude Hep B infection. She is immune to Hep A and Hep C Ab negative last year.  2. Plan to start prednisolone 40 mg daily if Hep B surface antigen negative for a total of 28 days of therapy. Check Lille score Day 7 to assess response and determination if prednisolone will be continued 3. Given she is coagulopathic with severe liver disease, she will benefit from Vitamin K SQ 10 mg injection 4. Continue Lactulose 30 mL TID with goal of 3-4 soft BMs daily 5. Rifaximin 550 mg BID for hepatic encephalopathy  6. CIWA protocol 7. IV fluid hydration, gentle at 50 cc/hr 8. Continue nutrition supplement, Ensure 9. Discussed importance of complete alcohol cessation. Prognosis is very poor if she continues to drink alcohol. We discussed severity of her current liver disease and she verbalized understanding 10. Following along with you   Thank you for the consult. Please call with questions or concerns.  Reeves Forth [facility] [phone] (Cell)

## 2019-08-15 NOTE — Progress Notes (Addendum)
PROGRESS NOTE    Lisa Dennis  BBC:[id number] DOB: 1966-03-10 DOA: 08/13/2019 PCP: Rusty Aus, MD    Chief complaint. Altered mental status  Brief Narrative:  Patient is a 53 year old female with history of alcohol abuse, alcohol liver cirrhosis, status post TIPS procedure who came to the emergency room with altered mental status and diagnosed with hepatic encephalopathy. Patient states that she has been taking lactulose as prescribed.  However, she has not had a bowel movement in 4 days until yesterday when she had a large bowel movement after giving additional dose of lactulose in the emergency room.  Patient also has some difficulty quitting alcohol, she had 2 drinks prior to the hospitalization.  Upon arrival in the emergency room, she was given lactulose, rifaximin and IV fluids.   Assessment & Plan:   Active Problems:   Hepatic encephalopathy ([place])  #1. 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 Status post TIPS procedure. Patient mental status improving today, she had a multiple loose stools overnight after lactulose. I'll continue IV fluids, continue lactulose as well as rifaximin. Patient has worsening jaundice with increased bilirubin up to 16.4. Will obtain GI consult.  #2. Alcoholic liver cirrhosis. As above.  3. 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  4. Macrocytic anemia. Check a B12 level, most likely due to liver cirrhosis.  5. Hypomagnesemia. Supplement.  6. Severe hypoalbuminemia.  Secondary to liver cirrhosis. Start a supplement.  7.  UTI. Add Rocephin.  DVT prophylaxis: SCDs Code Status: Full Family Communication: None in room Disposition Plan:   Patient came from: Home                                                                                                               Anticipated d/c place: Home  Barriers to d/c OR conditions which need to be met to effect a safe d/c:   Consultants:    None  Procedures: None Antimicrobials:None   Subjective: Patient is less confused today. Significant jaundice. No nausea vomiting. No abdominal pain. Had multiple loose stools overnight after giving lactulose. Denies any short of breath or cough. No fever or chills.  Objective: Vitals:   08/14/19 1714 08/14/19 2119 08/15/19 0015 08/15/19 0436  BP:  112/71 116/79 125/69  Pulse: 76 76 74 75  Resp:  '20 18 18  ' Temp:  98.4 F (36.9 C) 98.8 F (37.1 C) 98.9 F (37.2 C)  TempSrc:  Oral Oral Oral  SpO2: 100% 96% 97% 95%  Weight:      Height:        Intake/Output Summary (Last 24 hours) at 08/15/2019 0953 Last data filed at 08/15/2019 0654 Gross per 24 hour  Intake 1637.56 ml  Output --  Net 1637.56 ml   Filed Weights   08/13/19 2147 08/13/19 2154  Weight: 56.7 kg 56.7 kg    Examination:  General exam: Appears calm and comfortable  Respiratory system: Clear to auscultation. Respiratory effort normal. Cardiovascular system: S1 & S2 heard, RRR. No JVD, murmurs, rubs, gallops or clicks. No  pedal edema. Gastrointestinal system: Abdomen is nondistended, soft and nontender. No organomegaly or masses felt. Normal bowel sounds heard. Central nervous system: Alert and oriented. No focal neurological deficits. Extremities: Symmetric 5 x 5 power. Skin: No rashes, lesions or ulcers, severely jaundiced. Psychiatry: Judgement and insight appear normal. Mood & affect appropriate.     Data Reviewed: I have personally reviewed following labs and imaging studies  CBC: Recent Labs  Lab 08/13/19 2210 08/14/19 0324  WBC 6.5 5.3  NEUTROABS 3.9  --   HGB 10.4* 10.4*  HCT 29.7* 30.6*  MCV 99.0 102.7*  PLT 72* 63*   Basic Metabolic Panel: Recent Labs  Lab 08/13/19 2210 08/14/19 0324 08/14/19 1205 08/15/19 0402  NA 140 142 138 138  K 3.2* 3.6 3.2* 4.6  CL 102 108 103 106  CO2 '28 28 26 27  ' GLUCOSE 134* 139* 131* 119*  BUN '10 9 9 9  ' CREATININE 0.30* <0.30* <0.30* <0.30*   CALCIUM 8.3* 8.1* 8.1* 8.5*  MG  --   --  1.3* 1.4*  PHOS  --   --   --  UNABLE TO REPORT DUE TO ICTERUS   GFR: CrCl cannot be calculated (This lab value cannot be used to calculate CrCl because it is not a number: <0.30). Liver Function Tests: Recent Labs  Lab 08/13/19 2210 08/14/19 0324 08/14/19 1205 08/15/19 0402  AST 99* 99* 97* 87*  ALT '30 30 30 28  ' ALKPHOS 111 93 105 101  BILITOT 15.5* 14.4* 15.9* 16.4*  PROT 6.7 6.4* 6.7 6.2*  ALBUMIN 2.4* 2.1* 2.3* 2.1*   No results for input(s): LIPASE, AMYLASE in the last 168 hours. Recent Labs  Lab 08/13/19 2210 08/15/19 0402  AMMONIA 80* 68*   Coagulation Profile: Recent Labs  Lab 08/13/19 2210  INR 2.1*   Cardiac Enzymes: No results for input(s): CKTOTAL, CKMB, CKMBINDEX, TROPONINI in the last 168 hours. BNP (last 3 results) No results for input(s): PROBNP in the last 8760 hours. HbA1C: No results for input(s): HGBA1C in the last 72 hours. CBG: No results for input(s): GLUCAP in the last 168 hours. Lipid Profile: No results for input(s): CHOL, HDL, LDLCALC, TRIG, CHOLHDL, LDLDIRECT in the last 72 hours. Thyroid Function Tests: No results for input(s): TSH, T4TOTAL, FREET4, T3FREE, THYROIDAB in the last 72 hours. Anemia Panel: No results for input(s): VITAMINB12, FOLATE, FERRITIN, TIBC, IRON, RETICCTPCT in the last 72 hours. Sepsis Labs: Recent Labs  Lab 08/14/19 1156 08/14/19 1640  LATICACIDVEN 1.8 2.0*    Recent Results (from the past 240 hour(s))  SARS Coronavirus 2 by RT PCR (hospital order, performed in [facility] hospital lab) Nasopharyngeal Nasopharyngeal Swab     Status: None   Collection Time: 08/13/19 11:40 PM   Specimen: Nasopharyngeal Swab  Result Value Ref Range Status   SARS Coronavirus 2 NEGATIVE NEGATIVE Final    Comment: (NOTE) SARS-CoV-2 target nucleic acids are NOT DETECTED. The SARS-CoV-2 RNA is generally detectable in upper and lower respiratory specimens during the acute phase of  infection. The lowest concentration of SARS-CoV-2 viral copies this assay can detect is 250 copies / mL. A negative result does not preclude SARS-CoV-2 infection and should not be used as the sole basis for treatment or other patient management decisions.  A negative result may occur with improper specimen collection / handling, submission of specimen other than nasopharyngeal swab, presence of viral mutation(s) within the areas targeted by this assay, and inadequate number of viral copies (<250 copies / mL). A negative  result must be combined with clinical observations, patient history, and epidemiological information. Fact Sheet for Patients:   StrictlyIdeas.no Fact Sheet for Healthcare Providers: BankingDealers.co.za This test is not yet approved or cleared  by the [place] FDA and has been authorized for detection and/or diagnosis of SARS-CoV-2 by FDA under an Emergency Use Authorization (EUA).  This EUA will remain in effect (meaning this test can be used) for the duration of the COVID-19 declaration under Section 564(b)(1) of the Act, 21 U.S.C. section 360bbb-3(b)(1), unless the authorization is terminated or revoked sooner. Performed at [facility], [address], [place], [place] [address]          Radiology Studies: CT Head Wo Contrast  Result Date: 08/13/2019 CLINICAL DATA:  54 year old female with headache and weakness, lower extremity edema, jaundice. EXAM: CT HEAD WITHOUT CONTRAST TECHNIQUE: Contiguous axial images were obtained from the base of the skull through the vertex without intravenous contrast. COMPARISON:  Head CT 06/16/2019. FINDINGS: Brain: Stable age advanced generalized cerebral volume loss. No midline shift, ventriculomegaly, mass effect, evidence of mass lesion, intracranial hemorrhage or evidence of cortically based acute infarction. Mild for age white matter changes. No cortical  encephalomalacia identified. Vascular: Mild Calcified atherosclerosis at the skull base. No suspicious intracranial vascular hyperdensity. Skull: No acute osseous abnormality identified. Sinuses/Orbits: Visualized paranasal sinuses and mastoids are stable and well pneumatized. Other: Visualized orbits and scalp soft tissues are within normal limits. IMPRESSION: Stable generalized cerebral volume loss which is advanced for age. No acute intracranial abnormality. Electronically Signed   By: Genevie Ann M.D.   On: 08/13/2019 23:21   DG Chest Portable 1 View  Result Date: 08/13/2019 CLINICAL DATA:  Dizziness x5 days. EXAM: PORTABLE CHEST 1 VIEW COMPARISON:  November 12, 2018 FINDINGS: Mild, chronic appearing increased lung markings are seen without evidence of acute infiltrate, pleural effusion or pneumothorax. The cardiac silhouette is borderline in size and stable in appearance. The visualized skeletal structures are unremarkable. IMPRESSION: No active disease. Electronically Signed   By: Virgina Norfolk M.D.   On: 08/13/2019 23:03        Scheduled Meds: . folic acid  1 mg Oral Daily  . lactulose  30 g Oral TID  . LORazepam  0-4 mg Intravenous Q6H   Followed by  . [START ON 08/16/2019] LORazepam  0-4 mg Intravenous Q12H  . multivitamin with minerals  1 tablet Oral Daily  . nadolol  20 mg Oral Daily  . pantoprazole  40 mg Oral Daily  . rifaximin  200 mg Oral BID  . thiamine  100 mg Oral Daily   Continuous Infusions: . 0.9 % NaCl with KCl 20 mEq / L 50 mL/hr at 08/15/19 0500     LOS: 2 days    Time spent: 25 minutes    Sharen Hones, MD [facility]   To contact the attending provider between 7A-7P or the covering provider during after hours 7P-7A, please log into the web site [email or web address] and access using universal [place] password for that web site. If you do not have the password, please call the hospital operator.  08/15/2019, 9:53 AM

## 2019-08-16 DIAGNOSIS — N390 Urinary tract infection, site not specified: Secondary | ICD-10-CM

## 2019-08-16 LAB — CBC WITH DIFFERENTIAL/PLATELET
Abs Immature Granulocytes: 0.04 10*3/uL (ref 0.00–0.07)
Basophils Absolute: 0 10*3/uL (ref 0.0–0.1)
Basophils Relative: 0 %
Eosinophils Absolute: 0 10*3/uL (ref 0.0–0.5)
Eosinophils Relative: 0 %
HCT: 29.3 % — ABNORMAL LOW (ref 36.0–46.0)
Hemoglobin: 10 g/dL — ABNORMAL LOW (ref 12.0–15.0)
Immature Granulocytes: 1 %
Lymphocytes Relative: 11 %
Lymphs Abs: 1 10*3/uL (ref 0.7–4.0)
MCH: 35.3 pg — ABNORMAL HIGH (ref 26.0–34.0)
MCHC: 34.1 g/dL (ref 30.0–36.0)
MCV: 103.5 fL — ABNORMAL HIGH (ref 80.0–100.0)
Monocytes Absolute: 0.9 10*3/uL (ref 0.1–1.0)
Monocytes Relative: 10 %
Neutro Abs: 6.9 10*3/uL (ref 1.7–7.7)
Neutrophils Relative %: 78 %
Platelets: 79 10*3/uL — ABNORMAL LOW (ref 150–400)
RBC: 2.83 MIL/uL — ABNORMAL LOW (ref 3.87–5.11)
RDW: 14.7 % (ref 11.5–15.5)
WBC: 8.8 10*3/uL (ref 4.0–10.5)
nRBC: 0 % (ref 0.0–0.2)

## 2019-08-16 LAB — BASIC METABOLIC PANEL
Anion gap: 5 (ref 5–15)
BUN: 12 mg/dL (ref 6–20)
CO2: 27 mmol/L (ref 22–32)
Calcium: 9.3 mg/dL (ref 8.9–10.3)
Chloride: 105 mmol/L (ref 98–111)
Creatinine, Ser: <0.3 mg/dL — ABNORMAL LOW (ref 0.44–1.00)
Glucose, Bld: 148 mg/dL — ABNORMAL HIGH (ref 70–99)
Potassium: 3.9 mmol/L (ref 3.5–5.1)
Sodium: 137 mmol/L (ref 135–145)

## 2019-08-16 LAB — AMMONIA: Ammonia: 46 umol/L — ABNORMAL HIGH (ref 9–35)

## 2019-08-16 LAB — HEPATIC FUNCTION PANEL
ALT: 29 U/L (ref 0–44)
AST: 76 U/L — ABNORMAL HIGH (ref 15–41)
Albumin: 2.1 g/dL — ABNORMAL LOW (ref 3.5–5.0)
Alkaline Phosphatase: 93 U/L (ref 38–126)
Bilirubin, Direct: 8.5 mg/dL — ABNORMAL HIGH (ref 0.0–0.2)
Indirect Bilirubin: 9 mg/dL — ABNORMAL HIGH (ref 0.3–0.9)
Total Bilirubin: 17.5 mg/dL — ABNORMAL HIGH (ref 0.3–1.2)
Total Protein: 5.9 g/dL — ABNORMAL LOW (ref 6.5–8.1)

## 2019-08-16 LAB — MAGNESIUM: Magnesium: 1.4 mg/dL — ABNORMAL LOW (ref 1.7–2.4)

## 2019-08-16 MED ORDER — MAGNESIUM SULFATE 50 % IJ SOLN
3.0000 g | Freq: Four times a day (QID) | INTRAVENOUS | Status: AC
  Administered 2019-08-16 (×2): 3 g via INTRAVENOUS
  Filled 2019-08-16 (×2): qty 6

## 2019-08-16 NOTE — Progress Notes (Signed)
[facility] Gastroenterology Inpatient Progress Note  Subjective: Patient seen for followup of Severe alcoholic hepatitis with Maddrey score of 55. ON po medrol. Appears more alert, less confused today, though still looks ill.   Objective: Vital signs in last 24 hours: Temp:  [97.5 F (36.4 C)-98.2 F (36.8 C)] 97.5 F (36.4 C) (05/24 1147) Pulse Rate:  [79-87] 87 (05/24 1147) Resp:  [16-20] 16 (05/24 0438) BP: (104-123)/(64-75) 116/64 (05/24 1147) SpO2:  [93 %-100 %] 94 % (05/24 1147) Blood pressure 116/64, pulse 87, temperature (!) 97.5 F (36.4 C), temperature source Oral, resp. rate 16, height 5\' 2"  (1.575 m), weight 56.7 kg, SpO2 94 %.    Intake/Output from previous day: 05/23 0701 - 05/24 0700 In: 1117 [I.V.:1017; IV Piggyback:100] Out: -   Intake/Output this shift: Total I/O In: 340 [I.V.:340] Out: -    General appearance: More alert, NAD. Marked scleral and skin jaundice noted. Resp:  CTA Cardio: RRR GI:  Slightly distended, non-tender, BS+ Extremities:  NO edema.   Lab Results: Results for orders placed or performed during the hospital encounter of 08/13/19 (from the past 24 hour(s))  CBC with Differential/Platelet     Status: Abnormal   Collection Time: 08/16/19  5:41 AM  Result Value Ref Range   WBC 8.8 4.0 - 10.5 K/uL   RBC 2.83 (L) 3.87 - 5.11 MIL/uL   Hemoglobin 10.0 (L) 12.0 - 15.0 g/dL   HCT 29.3 (L) 36.0 - 46.0 %   MCV 103.5 (H) 80.0 - 100.0 fL   MCH 35.3 (H) 26.0 - 34.0 pg   MCHC 34.1 30.0 - 36.0 g/dL   RDW 14.7 11.5 - 15.5 %   Platelets 79 (L) 150 - 400 K/uL   nRBC 0.0 0.0 - 0.2 %   Neutrophils Relative % 78 %   Neutro Abs 6.9 1.7 - 7.7 K/uL   Lymphocytes Relative 11 %   Lymphs Abs 1.0 0.7 - 4.0 K/uL   Monocytes Relative 10 %   Monocytes Absolute 0.9 0.1 - 1.0 K/uL   Eosinophils Relative 0 %   Eosinophils Absolute 0.0 0.0 - 0.5 K/uL   Basophils Relative 0 %   Basophils Absolute 0.0 0.0 - 0.1 K/uL   Immature Granulocytes 1 %   Abs  Immature Granulocytes 0.04 0.00 - 0.07 K/uL  Basic metabolic panel     Status: Abnormal   Collection Time: 08/16/19  5:41 AM  Result Value Ref Range   Sodium 137 135 - 145 mmol/L   Potassium 3.9 3.5 - 5.1 mmol/L   Chloride 105 98 - 111 mmol/L   CO2 27 22 - 32 mmol/L   Glucose, Bld 148 (H) 70 - 99 mg/dL   BUN 12 6 - 20 mg/dL   Creatinine, Ser <0.30 (L) 0.44 - 1.00 mg/dL   Calcium 9.3 8.9 - 10.3 mg/dL   GFR calc non Af Amer NOT CALCULATED >60 mL/min   GFR calc Af Amer NOT CALCULATED >60 mL/min   Anion gap 5 5 - 15  Magnesium     Status: Abnormal   Collection Time: 08/16/19  5:41 AM  Result Value Ref Range   Magnesium 1.4 (L) 1.7 - 2.4 mg/dL  Hepatic function panel     Status: Abnormal   Collection Time: 08/16/19  5:41 AM  Result Value Ref Range   Total Protein 5.9 (L) 6.5 - 8.1 g/dL   Albumin 2.1 (L) 3.5 - 5.0 g/dL   AST 76 (H) 15 - 41 U/L   ALT  29 0 - 44 U/L   Alkaline Phosphatase 93 38 - 126 U/L   Total Bilirubin 17.5 (H) 0.3 - 1.2 mg/dL   Bilirubin, Direct 8.5 (H) 0.0 - 0.2 mg/dL   Indirect Bilirubin 9.0 (H) 0.3 - 0.9 mg/dL  Ammonia     Status: Abnormal   Collection Time: 08/16/19  5:41 AM  Result Value Ref Range   Ammonia 46 (H) 9 - 35 umol/L     Recent Labs    08/13/19 2210 08/14/19 0324 08/16/19 0541  WBC 6.5 5.3 8.8  HGB 10.4* 10.4* 10.0*  HCT 29.7* 30.6* 29.3*  PLT 72* 63* 79*   BMET Recent Labs    08/14/19 1205 08/15/19 0402 08/16/19 0541  NA 138 138 137  K 3.2* 4.6 3.9  CL 103 106 105  CO2 26 27 27   GLUCOSE 131* 119* 148*  BUN 9 9 12   CREATININE <0.30* <0.30* <0.30*  CALCIUM 8.1* 8.5* 9.3   LFT Recent Labs    08/16/19 0541  PROT 5.9*  ALBUMIN 2.1*  AST 76*  ALT 29  ALKPHOS 93  BILITOT 17.5*  BILIDIR 8.5*  IBILI 9.0*   PT/INR Recent Labs    08/13/19 2210 08/15/19 1109  LABPROT 22.5* 22.6*  INR 2.1* 2.1*   Hepatitis Panel Recent Labs    08/15/19 1109  HEPBSAG NON REACTIVE   C-Diff No results for input(s): CDIFFTOX in the  last 72 hours. No results for input(s): CDIFFPCR in the last 72 hours.   Studies/Results: No results found.  Scheduled Inpatient Medications:   . feeding supplement (ENSURE ENLIVE)  237 mL Oral BID BM  . folic acid  1 mg Oral Daily  . lactulose  30 g Oral TID  . LORazepam  0-4 mg Intravenous Q6H   Followed by  . LORazepam  0-4 mg Intravenous Q12H  . multivitamin with minerals  1 tablet Oral Daily  . pantoprazole  40 mg Oral Daily  . prednisoLONE  40 mg Oral Daily  . rifaximin  550 mg Oral BID  . thiamine  100 mg Oral Daily    Continuous Inpatient Infusions:   . 0.9 % NaCl with KCl 20 mEq / L 50 mL/hr at 08/16/19 0945  . cefTRIAXone (ROCEPHIN)  IV 1 g (08/16/19 1637)  . magnesium sulfate bolus IVPB 3 g (08/16/19 1631)    PRN Inpatient Medications:  LORazepam **OR** LORazepam, magnesium hydroxide, ondansetron **OR** ondansetron (ZOFRAN) IV, traMADol    Assessment:  1. Alcoholic hepatitis - Severe - on PO steroids. Bilirubin increased more today, but this is less concerning given that steroids have not yet begun to work. Longer observation is warranted. 2. Hepatic encephalopathy - secondary to Child Pugh Class "C" cirrhosis and TIPS procedure. Patient would benefit from Xifaxan at home if she could get some medication assistance. Improved today on Lactulose and Xifaxan combination. 3. Thrombocytopenia and coagulopathy secondary to cirrhosis.   Plan:  1. As above. Will continue to follow.  Shyteria Lewis K. Alice Reichert, M.D. 08/16/2019, 4:45 PM

## 2019-08-16 NOTE — Progress Notes (Signed)
PROGRESS NOTE    Lisa Dennis  LNL:[id number] DOB: 1966/02/21 DOA: 08/13/2019 PCP: Rusty Aus, MD   Chief complaint.  Altered mental status. Brief Narrative:  Patient is a 54 year old female with history of alcohol abuse, alcohol liver cirrhosis, status post TIPS procedurewho came to the emergency room with altered mental status and diagnosed with hepatic encephalopathy. Patient states that she has been taking lactulose as prescribed. However, she has not had a bowel movement in 4 days until yesterday when she had a large bowel movement after giving additional dose of lactulose in the emergency room. Patient also has some difficulty quitting alcohol, she had 2 drinks prior to the hospitalization.  Upon arrival in the emergency room, she was given lactulose, rifaximin and IV fluids.  Assessment & Plan:   Active Problems:   Hepatic encephalopathy ([place])  #1.  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 Status post TIPS procedure.  Patient mental status improving today.  Continue lactulose and IV fluids.  Appreciate GI consult, continue steroids for worsening liver function.  #2.  Alcohol liver cirrhosis. Seen by GI.  Bilirubin level continued to rise.  Prognosis uncertain.  #3.  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  Continue to follow.  4.  Hypomagnesemia. We will give 6 g of sodium sulfate today.  He received 4 g yesterday, level still low.  5.  Severe hypoalbuminemia. Continue supplement.  6.  UTI. Continues 3 days of IV Rocephin.  7.  Macrocytic anemia secondary to liver cirrhosis. B12 level normal.   DVT prophylaxis:SCDs Code Status:Full Family Communication:None in room Disposition Plan:  Patient came from:Home  Anticipated d/c place:Home  Barriers to d/c OR conditions which need to be met to effect a  safe d/c:   Consultants:  None  Procedures:None Antimicrobials: Rocephin.     Subjective: Patient mental status improved.  Jaundice seems to be getting worse. She has a poor appetite, multiple loose stools.  Tolerating Ensure. Denies any short of breath or cough. No fever or chills. No dysuria hematuria today.  Objective: Vitals:   08/15/19 0436 08/15/19 1648 08/15/19 2351 08/16/19 0438  BP: 125/69 104/65 123/71 120/75  Pulse: 75 80 84 79  Resp: '18 20 16 16  ' Temp: 98.9 F (37.2 C) 98.2 F (36.8 C) 98.1 F (36.7 C) 97.7 F (36.5 C)  TempSrc: Oral Oral Oral Oral  SpO2: 95% 100% 93% 97%  Weight:      Height:        Intake/Output Summary (Last 24 hours) at 08/16/2019 1028 Last data filed at 08/16/2019 0848 Gross per 24 hour  Intake 1456.95 ml  Output --  Net 1456.95 ml   Filed Weights   08/13/19 2147 08/13/19 2154  Weight: 56.7 kg 56.7 kg    Examination:  General exam: Appears calm and comfortable chronically ill, jaundiced. Respiratory system: Clear to auscultation. Respiratory effort normal. Cardiovascular system: S1 & S2 heard, RRR. No JVD, murmurs, rubs, gallops or clicks. No pedal edema. Gastrointestinal system: Abdomen is nondistended, soft and nontender. No organomegaly or masses felt. Normal bowel sounds heard. Central nervous system: Alert and oriented x3. No focal neurological deficits. Extremities: Symmetric  Skin: No rashes, lesions or ulcers Psychiatry:  Mood & affect appropriate.     Data Reviewed: I have personally reviewed following labs and imaging studies  CBC: Recent Labs  Lab 08/13/19 2210 08/14/19 0324 08/16/19 0541  WBC 6.5 5.3 8.8  NEUTROABS 3.9  --  6.9  HGB 10.4* 10.4* 10.0*  HCT 29.7*  30.6* 29.3*  MCV 99.0 102.7* 103.5*  PLT 72* 63* 79*   Basic Metabolic Panel: Recent Labs  Lab 08/13/19 2210 08/14/19 0324 08/14/19 1205 08/15/19 0402 08/16/19 0541  NA 140 142 138 138 137  K 3.2* 3.6 3.2* 4.6 3.9  CL 102 108  103 106 105  CO2 '28 28 26 27 27  ' GLUCOSE 134* 139* 131* 119* 148*  BUN '10 9 9 9 12  ' CREATININE 0.30* <0.30* <0.30* <0.30* <0.30*  CALCIUM 8.3* 8.1* 8.1* 8.5* 9.3  MG  --   --  1.3* 1.4* 1.4*  PHOS  --   --   --  UNABLE TO REPORT DUE TO ICTERUS  --    GFR: CrCl cannot be calculated (This lab value cannot be used to calculate CrCl because it is not a number: <0.30). Liver Function Tests: Recent Labs  Lab 08/13/19 2210 08/14/19 0324 08/14/19 1205 08/15/19 0402 08/16/19 0541  AST 99* 99* 97* 87* 76*  ALT '30 30 30 28 29  ' ALKPHOS 111 93 105 101 93  BILITOT 15.5* 14.4* 15.9* 16.4* 17.5*  PROT 6.7 6.4* 6.7 6.2* 5.9*  ALBUMIN 2.4* 2.1* 2.3* 2.1* 2.1*   No results for input(s): LIPASE, AMYLASE in the last 168 hours. Recent Labs  Lab 08/13/19 2210 08/15/19 0402 08/16/19 0541  AMMONIA 80* 68* 46*   Coagulation Profile: Recent Labs  Lab 08/13/19 2210 08/15/19 1109  INR 2.1* 2.1*   Cardiac Enzymes: No results for input(s): CKTOTAL, CKMB, CKMBINDEX, TROPONINI in the last 168 hours. BNP (last 3 results) No results for input(s): PROBNP in the last 8760 hours. HbA1C: No results for input(s): HGBA1C in the last 72 hours. CBG: No results for input(s): GLUCAP in the last 168 hours. Lipid Profile: No results for input(s): CHOL, HDL, LDLCALC, TRIG, CHOLHDL, LDLDIRECT in the last 72 hours. Thyroid Function Tests: No results for input(s): TSH, T4TOTAL, FREET4, T3FREE, THYROIDAB in the last 72 hours. Anemia Panel: Recent Labs    08/15/19 1009  VITAMINB12 1,436*   Sepsis Labs: Recent Labs  Lab 08/14/19 1156 08/14/19 1640  LATICACIDVEN 1.8 2.0*    Recent Results (from the past 240 hour(s))  SARS Coronavirus 2 by RT PCR (hospital order, performed in [facility] hospital lab) Nasopharyngeal Nasopharyngeal Swab     Status: None   Collection Time: 08/13/19 11:40 PM   Specimen: Nasopharyngeal Swab  Result Value Ref Range Status   SARS Coronavirus 2 NEGATIVE NEGATIVE Final     Comment: (NOTE) SARS-CoV-2 target nucleic acids are NOT DETECTED. The SARS-CoV-2 RNA is generally detectable in upper and lower respiratory specimens during the acute phase of infection. The lowest concentration of SARS-CoV-2 viral copies this assay can detect is 250 copies / mL. A negative result does not preclude SARS-CoV-2 infection and should not be used as the sole basis for treatment or other patient management decisions.  A negative result may occur with improper specimen collection / handling, submission of specimen other than nasopharyngeal swab, presence of viral mutation(s) within the areas targeted by this assay, and inadequate number of viral copies (<250 copies / mL). A negative result must be combined with clinical observations, patient history, and epidemiological information. Fact Sheet for Patients:   StrictlyIdeas.no Fact Sheet for Healthcare Providers: BankingDealers.co.za This test is not yet approved or cleared  by the [place] FDA and has been authorized for detection and/or diagnosis of SARS-CoV-2 by FDA under an Emergency Use Authorization (EUA).  This EUA will remain in effect (meaning this test  can be used) for the duration of the COVID-19 declaration under Section 564(b)(1) of the Act, 21 U.S.C. section 360bbb-3(b)(1), unless the authorization is terminated or revoked sooner. Performed at [facility], [address], [place], [place] [address]          Radiology Studies: No results found.      Scheduled Meds: . feeding supplement (ENSURE ENLIVE)  237 mL Oral BID BM  . folic acid  1 mg Oral Daily  . lactulose  30 g Oral TID  . LORazepam  0-4 mg Intravenous Q6H   Followed by  . LORazepam  0-4 mg Intravenous Q12H  . multivitamin with minerals  1 tablet Oral Daily  . pantoprazole  40 mg Oral Daily  . prednisoLONE  40 mg Oral Daily  . rifaximin  550 mg Oral BID  . thiamine  100 mg  Oral Daily   Continuous Infusions: . 0.9 % NaCl with KCl 20 mEq / L 50 mL/hr at 08/16/19 0945  . cefTRIAXone (ROCEPHIN)  IV Stopped (08/15/19 1752)  . magnesium sulfate bolus IVPB 3 g (08/16/19 0950)     LOS: 3 days    Time spent: 28 minutes    Sharen Hones, MD [facility]   To contact the attending provider between 7A-7P or the covering provider during after hours 7P-7A, please log into the web site [email or web address] and access using universal [place] password for that web site. If you do not have the password, please call the hospital operator.  08/16/2019, 10:28 AM

## 2019-08-16 NOTE — TOC Initial Note (Signed)
Transition of Care [facility]) - Initial/Assessment Note    Patient Details  Name: Lisa Dennis MRN: [id number] Date of Birth: 1965-07-10  Transition [facility]) CM/SW Contact:    Candie Chroman, LCSW Phone Number: 08/16/2019, 10:20 AM  Clinical Narrative: Readmission prevention screen complete. CSW met with patient. No supports at bedside. CSW introduced role and explained that discharge planning would be discussed. Patient is from home alone. PCP is Emily Filbert, MD at [facility]. Patient drives to her appointments. [facility] [address] [facility]. No issues obtaining medications. No home health or DME use prior to admission. No further concerns. CSW encouraged patient to contact CSW as needed. CSW will continue to follow patient for support and facilitate return home when stable.                 Expected Discharge Plan: Home/Self Care Barriers to Discharge: Continued Medical Work up   Patient Goals and CMS Choice     Choice offered to / list presented to : NA  Expected Discharge Plan and Services Expected Discharge Plan: Home/Self Care     Post Acute Care Choice: NA Living arrangements for the past 2 months: Single Family Home                                      Prior Living Arrangements/Services Living arrangements for the past 2 months: Single Family Home Lives with:: Self Patient language and need for interpreter reviewed:: Yes Do you feel safe going back to the place where you live?: Yes      Need for Family Participation in Patient Care: Yes (Comment) Care giver support system in place?: No (comment)   Criminal Activity/Legal Involvement Pertinent to Current Situation/Hospitalization: No - Comment as needed  Activities of Daily Living Home Assistive Devices/Equipment: None ADL Screening (condition at time of admission) Patient's cognitive ability adequate to safely complete daily activities?: Yes Is the patient deaf or have difficulty  hearing?: No Does the patient have difficulty seeing, even when wearing glasses/contacts?: No Does the patient have difficulty concentrating, remembering, or making decisions?: No Patient able to express need for assistance with ADLs?: Yes Does the patient have difficulty dressing or bathing?: No Independently performs ADLs?: Yes (appropriate for developmental age) Does the patient have difficulty walking or climbing stairs?: No Weakness of Legs: None Weakness of Arms/Hands: None  Permission Sought/Granted                  Emotional Assessment Appearance:: Appears stated age Attitude/Demeanor/Rapport: Engaged, Gracious Affect (typically observed): Accepting, Appropriate, Calm, Pleasant Orientation: : Oriented to Self, Oriented to Place, Oriented to  Time, Oriented to Situation Alcohol / Substance Use: Alcohol Use Psych Involvement: No (comment)  Admission diagnosis:  Hepatic encephalopathy ([place]) [K72.90] Jaundice [R17] Patient Active Problem List   Diagnosis Date Noted  . Hepatic encephalopathy ([place]) 06/05/2019  . 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 Rectal bleeding   . Acute GI bleeding 04/03/2019  . Alcohol withdrawal syndrome without complication ([place]) 30/09/6224  . Bleeding behind the abdominal cavity   . SBP (spontaneous bacterial peritonitis) ([place])   . Intra-abdominal varices   . Hemorrhage, intraperitoneal 11/15/2018  . Alcoholic cirrhosis of liver with ascites ([place]) 11/13/2018  . Alcoholic intoxication with complication ([place]) 33/35/4562  . Decompensated hepatic cirrhosis ([place]) 08/27/2018  . GI bleeding 06/08/2018  . Alcoholic cirrhosis of liver  without ascites ([place])   . Hematemesis without nausea    PCP:  Rusty Aus, MD Pharmacy:   [facility] [phone], [place] [address] Phone: [phone] Fax:  [phone]  [id number] [place] [place] [id number]: [phone]: [phone]    Social Determinants of Health (SDOH) Interventions    Readmission Risk Interventions Readmission Risk Prevention Plan 08/16/2019 08/30/2018  Transportation Screening Complete Complete  PCP or Specialist Appt within 5-7 Days - Not Complete  Not Complete comments - unable to make pcp appt on a Sunday  Home Care Screening - Complete  Medication Review (RN CM) - Complete  Medication Review (RGray Complete -  PCP or Specialist appointment within 3-5 days of discharge Complete -  SW Recovery Care/Counseling Consult Complete -  POlive BranchNot Applicable -  Some encounter information is confidential and restricted. Go to Review Flowsheets activity to see all data.

## 2019-08-17 DIAGNOSIS — D696 Thrombocytopenia, unspecified: Secondary | ICD-10-CM | POA: Diagnosis present

## 2019-08-17 LAB — HEPATIC FUNCTION PANEL
ALT: 29 U/L (ref 0–44)
AST: 62 U/L — ABNORMAL HIGH (ref 15–41)
Albumin: 2 g/dL — ABNORMAL LOW (ref 3.5–5.0)
Alkaline Phosphatase: 87 U/L (ref 38–126)
Bilirubin, Direct: 8.9 mg/dL — ABNORMAL HIGH (ref 0.0–0.2)
Indirect Bilirubin: 8.9 mg/dL — ABNORMAL HIGH (ref 0.3–0.9)
Total Bilirubin: 17.8 mg/dL — ABNORMAL HIGH (ref 0.3–1.2)
Total Protein: 5.9 g/dL — ABNORMAL LOW (ref 6.5–8.1)

## 2019-08-17 LAB — BASIC METABOLIC PANEL
Anion gap: 8 (ref 5–15)
BUN: 14 mg/dL (ref 6–20)
CO2: 25 mmol/L (ref 22–32)
Calcium: 9.4 mg/dL (ref 8.9–10.3)
Chloride: 103 mmol/L (ref 98–111)
Creatinine, Ser: 0.32 mg/dL — ABNORMAL LOW (ref 0.44–1.00)
GFR calc Af Amer: >60 mL/min (ref >60)
GFR calc non Af Amer: >60 mL/min (ref >60)
Glucose, Bld: 153 mg/dL — ABNORMAL HIGH (ref 70–99)
Potassium: 3.3 mmol/L — ABNORMAL LOW (ref 3.5–5.1)
Sodium: 136 mmol/L (ref 135–145)

## 2019-08-17 LAB — URINE CULTURE: Culture: 40000 — AB

## 2019-08-17 LAB — MAGNESIUM: Magnesium: 1.4 mg/dL — ABNORMAL LOW (ref 1.7–2.4)

## 2019-08-17 LAB — AMMONIA: Ammonia: 30 umol/L (ref 9–35)

## 2019-08-17 LAB — PHOSPHORUS: Phosphorus: UNDETERMINED mg/dL (ref 2.5–4.6)

## 2019-08-17 MED ORDER — POTASSIUM CHLORIDE 20 MEQ PO PACK
40.0000 meq | PACK | Freq: Four times a day (QID) | ORAL | Status: AC
  Administered 2019-08-17 (×2): 40 meq via ORAL
  Filled 2019-08-17 (×2): qty 2

## 2019-08-17 MED ORDER — MAGNESIUM SULFATE 4 GM/100ML IV SOLN
4.0000 g | Freq: Four times a day (QID) | INTRAVENOUS | Status: AC
  Administered 2019-08-17 (×2): 4 g via INTRAVENOUS
  Filled 2019-08-17 (×2): qty 100

## 2019-08-17 MED ORDER — SODIUM CHLORIDE 0.9 % IV SOLN: INTRAVENOUS | Status: DC | PRN

## 2019-08-17 NOTE — Progress Notes (Signed)
PROGRESS NOTE    Lisa Dennis  WSF:[id number] DOB: 1965/10/04 DOA: 08/13/2019 PCP: Rusty Aus, MD   Chief complaint. Altered mental status. Brief Narrative:  Patient is a 54 year old female with history of alcohol abuse, alcohol liver cirrhosis, status post TIPS procedurewho came to the emergency room with altered mental status and diagnosed with hepatic encephalopathy. Patient states that she has been taking lactulose as prescribed. However, she has not had a bowel movement in 4 days until yesterday when she had a large bowel movement after giving additional dose of lactulose in the emergency room. Patient also has some difficulty quitting alcohol, she had 2 drinks prior to the hospitalization.  Upon arrival in the emergency room, she was given lactulose, rifaximin and IV fluids.  5/23. Patient had a worsening liver function with increased bilirubin. GI consult was obtained. Patient had a discriminant function score of 55, deemed to be severe liver cirrhosis and poor prognosis. Steroids is started with prednisone 40 mg p.o. daily. Increased dose of rifaximin to twice a day. Continued IV fluids. Patient also had evidence of UTI, Rocephin is started for 3 days.    Assessment & Plan:   Active Problems:   Hepatic encephalopathy ([place])  #1. 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 Status post TIPS procedure. Condition is gradually improving. Continue lactulose, rifaximin. Appreciate GI consult. Given refractory hypomagnesemia, I will discontinue IV fluids.  #2. Alcohol liver cirrhosis. Patient has a severe jaundice, bilirubin still going up. Continue prednisone started by GI. Follow-up with hepatic panel daily. Patient also has evidence of coagulopathy, received vitamin K.  3. Refractory hypomagnesemia. Patient received 6 g of magnesium sulfate on 5/24. Magnesium level still low at 1.4. We will give 8 g of magnesium sulfate today. Recheck level  tomorrow. Discontinue IV fluids.  4. 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 Continue to follow. Condition stable.  5. E. coli urinary tract infection. We will complete 3 days of IV Rocephin. Culture sensitive to Rocephin.  6. Severe hypoalbuminemia. Secondary to liver cirrhosis. Continue supplement with Ensure.  7. Macrocytic anemia secondary to liver cirrhosis. Normal B12 level. Monitor, transfuse as needed.  DVT prophylaxis:SCDs Code Status:Full Family Communication:None in room Disposition Plan:  Patient came from:Home  Anticipated d/c place:Home  Barriers to d/c OR conditions which need to be met to effect a safe d/c:   Consultants:  None  Procedures:None Antimicrobials: Rocephin.  Subjective: Patient feels better today. No additional confusion. She was able to walk with the nurse, she had a tachycardia when she was walking. Denies any short of breath or cough. No abdominal pain or nausea vomiting. She has loose stools due to lactulose. No fever or chills. No dysuria hematuria.  Objective: Vitals:   08/16/19 1147 08/16/19 2016 08/17/19 0619 08/17/19 1128  BP: 116/64 128/72 138/84 119/75  Pulse: 87 (!) 108 95 98  Resp:  20    Temp: (!) 97.5 F (36.4 C) 97.8 F (36.6 C) 98.1 F (36.7 C) 98.4 F (36.9 C)  TempSrc: Oral Oral Oral Oral  SpO2: 94% 97% 98% 96%  Weight:      Height:        Intake/Output Summary (Last 24 hours) at 08/17/2019 1310 Last data filed at 08/17/2019 1200 Gross per 24 hour  Intake 1673.73 ml  Output --  Net 1673.73 ml   Filed Weights   08/13/19 2147 08/13/19 2154  Weight: 56.7 kg 56.7 kg    Examination:  General exam: Appears calm and comfortable, chronically ill. Respiratory  system: Clear to auscultation. Respiratory effort normal. Cardiovascular system: S1 & S2 heard, RRR. No JVD, murmurs, rubs,  gallops or clicks. No pedal edema. Gastrointestinal system: Abdomen is nondistended, soft and nontender. No organomegaly or masses felt. Normal bowel sounds heard. Central nervous system: Alert and oriented. No focal neurological deficits. Extremities: Symmetric 5 x 5 power. Skin: Looks yellow. Psychiatry: Judgement and insight appear normal. Mood & affect appropriate.     Data Reviewed: I have personally reviewed following labs and imaging studies  CBC: Recent Labs  Lab 08/13/19 2210 08/14/19 0324 08/16/19 0541  WBC 6.5 5.3 8.8  NEUTROABS 3.9  --  6.9  HGB 10.4* 10.4* 10.0*  HCT 29.7* 30.6* 29.3*  MCV 99.0 102.7* 103.5*  PLT 72* 63* 79*   Basic Metabolic Panel: Recent Labs  Lab 08/14/19 0324 08/14/19 1205 08/15/19 0402 08/16/19 0541 08/17/19 0505  NA 142 138 138 137 136  K 3.6 3.2* 4.6 3.9 3.3*  CL 108 103 106 105 103  CO2 '28 26 27 27 25  ' GLUCOSE 139* 131* 119* 148* 153*  BUN '9 9 9 12 14  ' CREATININE <0.30* <0.30* <0.30* <0.30* 0.32*  CALCIUM 8.1* 8.1* 8.5* 9.3 9.4  MG  --  1.3* 1.4* 1.4* 1.4*  PHOS  --   --  UNABLE TO REPORT DUE TO ICTERUS  --  3.1   GFR: Estimated Creatinine Clearance: 64.3 mL/min (A) (by C-G formula based on SCr of 0.32 mg/dL (L)). Liver Function Tests: Recent Labs  Lab 08/14/19 0324 08/14/19 1205 08/15/19 0402 08/16/19 0541 08/17/19 0505  AST 99* 97* 87* 76* 62*  ALT '30 30 28 29 29  ' ALKPHOS 93 105 101 93 87  BILITOT 14.4* 15.9* 16.4* 17.5* 17.8*  PROT 6.4* 6.7 6.2* 5.9* 5.9*  ALBUMIN 2.1* 2.3* 2.1* 2.1* 2.0*   No results for input(s): LIPASE, AMYLASE in the last 168 hours. Recent Labs  Lab 08/13/19 2210 08/15/19 0402 08/16/19 0541 08/17/19 0505  AMMONIA 80* 68* 46* 30   Coagulation Profile: Recent Labs  Lab 08/13/19 2210 08/15/19 1109  INR 2.1* 2.1*   Cardiac Enzymes: No results for input(s): CKTOTAL, CKMB, CKMBINDEX, TROPONINI in the last 168 hours. BNP (last 3 results) No results for input(s): PROBNP in the last  8760 hours. HbA1C: No results for input(s): HGBA1C in the last 72 hours. CBG: No results for input(s): GLUCAP in the last 168 hours. Lipid Profile: No results for input(s): CHOL, HDL, LDLCALC, TRIG, CHOLHDL, LDLDIRECT in the last 72 hours. Thyroid Function Tests: No results for input(s): TSH, T4TOTAL, FREET4, T3FREE, THYROIDAB in the last 72 hours. Anemia Panel: Recent Labs    08/15/19 1009  VITAMINB12 1,436*   Sepsis Labs: Recent Labs  Lab 08/14/19 1156 08/14/19 1640  LATICACIDVEN 1.8 2.0*    Recent Results (from the past 240 hour(s))  SARS Coronavirus 2 by RT PCR (hospital order, performed in [facility] hospital lab) Nasopharyngeal Nasopharyngeal Swab     Status: None   Collection Time: 08/13/19 11:40 PM   Specimen: Nasopharyngeal Swab  Result Value Ref Range Status   SARS Coronavirus 2 NEGATIVE NEGATIVE Final    Comment: (NOTE) SARS-CoV-2 target nucleic acids are NOT DETECTED. The SARS-CoV-2 RNA is generally detectable in upper and lower respiratory specimens during the acute phase of infection. The lowest concentration of SARS-CoV-2 viral copies this assay can detect is 250 copies / mL. A negative result does not preclude SARS-CoV-2 infection and should not be used as the sole basis for treatment or other  patient management decisions.  A negative result may occur with improper specimen collection / handling, submission of specimen other than nasopharyngeal swab, presence of viral mutation(s) within the areas targeted by this assay, and inadequate number of viral copies (<250 copies / mL). A negative result must be combined with clinical observations, patient history, and epidemiological information. Fact Sheet for Patients:   StrictlyIdeas.no Fact Sheet for Healthcare Providers: BankingDealers.co.za This test is not yet approved or cleared  by the [place] FDA and has been authorized for detection and/or diagnosis  of SARS-CoV-2 by FDA under an Emergency Use Authorization (EUA).  This EUA will remain in effect (meaning this test can be used) for the duration of the COVID-19 declaration under Section 564(b)(1) of the Act, 21 U.S.C. section 360bbb-3(b)(1), unless the authorization is terminated or revoked sooner. Performed at [facility], [address] [place], [place] [address]   Urine Culture     Status: Abnormal   Collection Time: 08/15/19  1:05 PM   Specimen: Urine, Random  Result Value Ref Range Status   Specimen Description   Final    URINE, RANDOM Performed at [facility], [address] [place], [place] [address]    Special Requests   Final    NONE Performed at [facility], [place], [place] [address]    Culture 40,000 COLONIES/mL ESCHERICHIA COLI (A)  Final   Report Status 08/17/2019 FINAL  Final   Organism ID, Bacteria ESCHERICHIA COLI (A)  Final      Susceptibility   Escherichia coli - MIC*    AMPICILLIN 16 INTERMEDIATE Intermediate     CEFAZOLIN <=4 SENSITIVE Sensitive     CEFTRIAXONE <=1 SENSITIVE Sensitive     CIPROFLOXACIN >=4 RESISTANT Resistant     GENTAMICIN <=1 SENSITIVE Sensitive     IMIPENEM <=0.25 SENSITIVE Sensitive     NITROFURANTOIN <=16 SENSITIVE Sensitive     TRIMETH/SULFA <=20 SENSITIVE Sensitive     AMPICILLIN/SULBACTAM 8 SENSITIVE Sensitive     PIP/TAZO <=4 SENSITIVE Sensitive     * 40,000 COLONIES/mL ESCHERICHIA COLI         Radiology Studies: No results found.      Scheduled Meds: . feeding supplement (ENSURE ENLIVE)  237 mL Oral BID BM  . folic acid  1 mg Oral Daily  . lactulose  30 g Oral TID  . LORazepam  0-4 mg Intravenous Q12H  . multivitamin with minerals  1 tablet Oral Daily  . pantoprazole  40 mg Oral Daily  . potassium chloride  40 mEq Oral Q6H  . prednisoLONE  40 mg Oral Daily  . rifaximin  550 mg Oral BID  . thiamine  100 mg Oral Daily   Continuous Infusions: . 0.9 % NaCl  with KCl 20 mEq / L Stopped (08/17/19 1116)  . cefTRIAXone (ROCEPHIN)  IV Stopped (08/16/19 1707)  . magnesium sulfate bolus IVPB       LOS: 4 days    Time spent: 28 minutes    Sharen Hones, MD [facility]   To contact the attending provider between 7A-7P or the covering provider during after hours 7P-7A, please log into the web site [email or web address] and access using universal [place] password for that web site. If you do not have the password, please call the hospital operator.  08/17/2019, 1:10 PM

## 2019-08-18 DIAGNOSIS — D696 Thrombocytopenia, unspecified: Secondary | ICD-10-CM

## 2019-08-18 DIAGNOSIS — Z515 Encounter for palliative care: Secondary | ICD-10-CM

## 2019-08-18 DIAGNOSIS — R17 Unspecified jaundice: Secondary | ICD-10-CM

## 2019-08-18 DIAGNOSIS — K652 Spontaneous bacterial peritonitis: Secondary | ICD-10-CM

## 2019-08-18 DIAGNOSIS — K7460 Unspecified cirrhosis of liver: Secondary | ICD-10-CM

## 2019-08-18 DIAGNOSIS — Z95828 Presence of other vascular implants and grafts: Secondary | ICD-10-CM

## 2019-08-18 DIAGNOSIS — K7031 Alcoholic cirrhosis of liver with ascites: Secondary | ICD-10-CM

## 2019-08-18 DIAGNOSIS — Z7189 Other specified counseling: Secondary | ICD-10-CM

## 2019-08-18 LAB — CBC WITH DIFFERENTIAL/PLATELET
Abs Immature Granulocytes: 0.14 10*3/uL — ABNORMAL HIGH (ref 0.00–0.07)
Basophils Absolute: 0.1 10*3/uL (ref 0.0–0.1)
Basophils Relative: 0 %
Eosinophils Absolute: 0.1 10*3/uL (ref 0.0–0.5)
Eosinophils Relative: 1 %
HCT: 26.6 % — ABNORMAL LOW (ref 36.0–46.0)
Hemoglobin: 9.2 g/dL — ABNORMAL LOW (ref 12.0–15.0)
Immature Granulocytes: 1 %
Lymphocytes Relative: 14 %
Lymphs Abs: 1.9 10*3/uL (ref 0.7–4.0)
MCH: 35.9 pg — ABNORMAL HIGH (ref 26.0–34.0)
MCHC: 34.6 g/dL (ref 30.0–36.0)
MCV: 103.9 fL — ABNORMAL HIGH (ref 80.0–100.0)
Monocytes Absolute: 2 10*3/uL — ABNORMAL HIGH (ref 0.1–1.0)
Monocytes Relative: 15 %
Neutro Abs: 9.3 10*3/uL — ABNORMAL HIGH (ref 1.7–7.7)
Neutrophils Relative %: 69 %
Platelets: 92 10*3/uL — ABNORMAL LOW (ref 150–400)
RBC: 2.56 MIL/uL — ABNORMAL LOW (ref 3.87–5.11)
RDW: 14.8 % (ref 11.5–15.5)
WBC: 13.4 10*3/uL — ABNORMAL HIGH (ref 4.0–10.5)
nRBC: 0.1 % (ref 0.0–0.2)

## 2019-08-18 LAB — BASIC METABOLIC PANEL
Anion gap: 5 (ref 5–15)
BUN: 14 mg/dL (ref 6–20)
CO2: 28 mmol/L (ref 22–32)
Calcium: 9.8 mg/dL (ref 8.9–10.3)
Chloride: 103 mmol/L (ref 98–111)
Creatinine, Ser: <0.3 mg/dL — ABNORMAL LOW (ref 0.44–1.00)
Glucose, Bld: 103 mg/dL — ABNORMAL HIGH (ref 70–99)
Potassium: 4 mmol/L (ref 3.5–5.1)
Sodium: 136 mmol/L (ref 135–145)

## 2019-08-18 LAB — PROTIME-INR
INR: 2.1 — ABNORMAL HIGH (ref 0.8–1.2)
Prothrombin Time: 22.8 seconds — ABNORMAL HIGH (ref 11.4–15.2)

## 2019-08-18 LAB — HEPATIC FUNCTION PANEL
ALT: 29 U/L (ref 0–44)
AST: 58 U/L — ABNORMAL HIGH (ref 15–41)
Albumin: 2.2 g/dL — ABNORMAL LOW (ref 3.5–5.0)
Alkaline Phosphatase: 97 U/L (ref 38–126)
Bilirubin, Direct: 8.1 mg/dL — ABNORMAL HIGH (ref 0.0–0.2)
Indirect Bilirubin: 7.8 mg/dL — ABNORMAL HIGH (ref 0.3–0.9)
Total Bilirubin: 15.9 mg/dL — ABNORMAL HIGH (ref 0.3–1.2)
Total Protein: 5.9 g/dL — ABNORMAL LOW (ref 6.5–8.1)

## 2019-08-18 LAB — PHOSPHORUS: Phosphorus: 3.3 mg/dL (ref 2.5–4.6)

## 2019-08-18 LAB — MAGNESIUM: Magnesium: 1.9 mg/dL (ref 1.7–2.4)

## 2019-08-18 MED ORDER — LACTULOSE 10 GM/15ML PO SOLN: 30.0000 g | Freq: Three times a day (TID) | ORAL | 0 refills | Status: DC

## 2019-08-18 MED ORDER — RIFAXIMIN 550 MG PO TABS: 550.0000 mg | ORAL_TABLET | Freq: Two times a day (BID) | ORAL | 1 refills | Status: DC

## 2019-08-18 MED ORDER — PREDNISOLONE 15 MG/5ML PO SOLN: 40.0000 mg | Freq: Every day | ORAL | 1 refills | Status: DC

## 2019-08-18 NOTE — Progress Notes (Signed)
New referral for [employer] community Palliative program to follow at home received from [facility].  Patient information sent to referral. Thank you for this referral. Flo Shanks BSN, RN, [place] [phone]

## 2019-08-18 NOTE — Discharge Summary (Signed)
Physician Discharge Summary  Lisa Dennis [id number] DOB: [birth year] DOA: 08/13/2019  PCP: Rusty Aus, MD  Admit date: 08/13/2019 Discharge date: 08/18/2019  Admitted From: Home Disposition: Home  Recommendations for Outpatient Follow-up:  1. Follow up with PCP in 1-2 weeks 2. Follow-up with hepatologist 3. Please obtain BMP/CBC in one week 4. Please follow up on the following pending results: None  Home Health:No Equipment/Devices: None Discharge Condition: Stable CODE STATUS: Full Diet recommendation: Heart Healthy   Brief/Interim Summary: Patient is a 54 year old female with history of alcohol abuse, alcohol liver cirrhosis, status post TIPS procedurewho came to the emergency room with altered mental status and diagnosed with hepatic encephalopathy. Patient states that she has been taking lactulose as prescribed. However, she has not had a bowel movement in 4 days until yesterday when she had a large bowel movement after giving additional dose of lactulose in the emergency room. Patient also has some difficulty quitting alcohol, she had 2 drinks prior to the hospitalization.  Upon arrival in the emergency room, she was given lactulose, rifaximin and IV fluids.  5/23. Patient had a worsening liver function with increased bilirubin. GI consult was obtained. Patient had a discriminant function score of 55, deemed to be severe liver cirrhosis and poor prognosis. Steroids is started with prednisone 40 mg p.o. daily. Increased dose of rifaximin to twice a day.  Encephalopathy improved.  Patient was back to her baseline by mental status.  Liver function started improving.  She will continue with steroid and will follow up with her hepatologist next week.  Patient did had electrolyte abnormalities which were repleted.  Thrombocytopenia was stable on discharge.  Patient did had E. coli UTI which was treated with Rocephin.  palliative care was consulted.  Patient  would like to pursue advanced management including liver transplant if possible.  Discharge Diagnoses:  Active Problems:   Decompensated hepatic cirrhosis (HCC)   Alcoholic cirrhosis of liver with ascites (HCC)   Hepatic encephalopathy (HCC)   Hypomagnesemia   Thrombocytopenia (HCC)   Jaundice   Discharge Instructions  Discharge Instructions    Amb Referral to Palliative Care   Complete by: As directed    Diet - low sodium heart healthy   Complete by: As directed    Discharge instructions   Complete by: As directed    It was pleasure taking care of you. Please take your medications as directed and follow-up with your hepatologist at [facility] next week.   Increase activity slowly   Complete by: As directed      Allergies as of 08/18/2019      Reactions   Benadryl [diphenhydramine] Other (See Comments)   Altered Mental Status   Venlafaxine Hcl Er Itching   Penicillins Hives, Other (See Comments)   Did it involve swelling of the face/tongue/throat, SOB, or low BP? No Did it involve sudden or severe rash/hives, skin peeling, or any reaction on the inside of your mouth or nose? Unknown Did you need to seek medical attention at a hospital or doctor's office? Unknown When did it last happen?unknown If all above answers are "NO", may proceed with cephalosporin use.  **patient tolerates ceftriaxone - Aug 2020   Clindamycin/lincomycin Swelling   Latex Hives   Morphine And Related Hives   Sulfa Antibiotics Hives   Tetracyclines & Related Hives      Medication List    STOP taking these medications   ciprofloxacin 250 MG tablet Commonly known as: CIPRO     TAKE  these medications   folic acid 1 MG tablet Commonly known as: FOLVITE Take 1 tablet (1 mg total) by mouth daily.   lactulose 10 GM/15ML solution Commonly known as: CHRONULAC Take 45 mLs (30 g total) by mouth 3 (three) times daily. Hold for more than 2 loose bowel movements. What changed: how much to take    LORazepam 0.5 MG tablet Commonly known as: ATIVAN Take 0.5 mg by mouth daily as needed for anxiety.   multivitamin with minerals Tabs tablet Take 1 tablet by mouth daily.   nadolol 20 MG tablet Commonly known as: CORGARD Take 20 mg by mouth daily.   oxyCODONE-acetaminophen 7.5-325 MG tablet Commonly known as: Percocet Take 1 tablet by mouth every 6 (six) hours as needed.   pantoprazole 40 MG tablet Commonly known as: PROTONIX Take 40 mg by mouth daily.   prednisoLONE 15 MG/5ML Soln Commonly known as: PRELONE Take 13.3 mLs (40 mg total) by mouth daily. Start taking on: Aug 19, 2019   rifaximin 550 MG Tabs tablet Commonly known as: XIFAXAN Take 1 tablet (550 mg total) by mouth 2 (two) times daily.   thiamine 100 MG tablet Take 1 tablet (100 mg total) by mouth daily.      Follow-up Information    Rusty Aus, MD Follow up.   Specialty: Internal Medicine Why: Please schedule hospital follow up appt within 3-5 days of discharge. Contact information: [place] [address] [phone]          Allergies  Allergen Reactions  . Benadryl [Diphenhydramine] Other (See Comments)    Altered Mental Status  . Venlafaxine Hcl Er Itching  . Penicillins Hives and Other (See Comments)    Did it involve swelling of the face/tongue/throat, SOB, or low BP? No Did it involve sudden or severe rash/hives, skin peeling, or any reaction on the inside of your mouth or nose? Unknown Did you need to seek medical attention at a hospital or doctor's office? Unknown When did it last happen?unknown If all above answers are "NO", may proceed with cephalosporin use.  **patient tolerates ceftriaxone - Aug 2020   . Clindamycin/Lincomycin Swelling  . Latex Hives  . Morphine And Related Hives  . Sulfa Antibiotics Hives  . Tetracyclines & Related Hives    Consultations:  GI  Procedures/Studies: CT Head Wo Contrast  Result  Date: 08/13/2019 CLINICAL DATA:  54 year old female with headache and weakness, lower extremity edema, jaundice. EXAM: CT HEAD WITHOUT CONTRAST TECHNIQUE: Contiguous axial images were obtained from the base of the skull through the vertex without intravenous contrast. COMPARISON:  Head CT 06/16/2019. FINDINGS: Brain: Stable age advanced generalized cerebral volume loss. No midline shift, ventriculomegaly, mass effect, evidence of mass lesion, intracranial hemorrhage or evidence of cortically based acute infarction. Mild for age white matter changes. No cortical encephalomalacia identified. Vascular: Mild Calcified atherosclerosis at the skull base. No suspicious intracranial vascular hyperdensity. Skull: No acute osseous abnormality identified. Sinuses/Orbits: Visualized paranasal sinuses and mastoids are stable and well pneumatized. Other: Visualized orbits and scalp soft tissues are within normal limits. IMPRESSION: Stable generalized cerebral volume loss which is advanced for age. No acute intracranial abnormality. Electronically Signed   By: Genevie Ann M.D.   On: 08/13/2019 23:21   DG Chest Portable 1 View  Result Date: 08/13/2019 CLINICAL DATA:  Dizziness x5 days. EXAM: PORTABLE CHEST 1 VIEW COMPARISON:  November 12, 2018 FINDINGS: Mild, chronic appearing increased lung markings are seen without evidence of acute  infiltrate, pleural effusion or pneumothorax. The cardiac silhouette is borderline in size and stable in appearance. The visualized skeletal structures are unremarkable. IMPRESSION: No active disease. Electronically Signed   By: Virgina Norfolk M.D.   On: 08/13/2019 23:03     Subjective: Patient was feeling better when seen today.  She wants to go home and would like to follow-up with her hepatologist at [facility].  Discharge Exam: Vitals:   08/18/19 0454 08/18/19 1200  BP: 110/62 124/74  Pulse: (!) 109 (!) 101  Resp: 18 16  Temp: 98 F (36.7 C) (!) 97.5 F (36.4 C)  SpO2: 96% 95%    Vitals:   08/17/19 1453 08/17/19 1924 08/18/19 0454 08/18/19 1200  BP: 134/77 117/77 110/62 124/74  Pulse: (!) 102 (!) 108 (!) 109 (!) 101  Resp: 18 20 18 16   Temp: 98.5 F (36.9 C) 98.3 F (36.8 C) 98 F (36.7 C) (!) 97.5 F (36.4 C)  TempSrc: Oral Oral Oral Oral  SpO2: 99% 97% 96% 95%  Weight:      Height:        General: Pt is alert, awake, not in acute distress Cardiovascular: RRR, S1/S2 +, no rubs, no gallops Respiratory: CTA bilaterally, no wheezing, no rhonchi Abdominal: Soft, NT, ND, bowel sounds + Extremities: no edema, no cyanosis   The results of significant diagnostics from this hospitalization (including imaging, microbiology, ancillary and laboratory) are listed below for reference.    Microbiology: Recent Results (from the past 240 hour(s))  SARS Coronavirus 2 by RT PCR (hospital order, performed in [facility] hospital lab) Nasopharyngeal Nasopharyngeal Swab     Status: None   Collection Time: 08/13/19 11:40 PM   Specimen: Nasopharyngeal Swab  Result Value Ref Range Status   SARS Coronavirus 2 NEGATIVE NEGATIVE Final    Comment: (NOTE) SARS-CoV-2 target nucleic acids are NOT DETECTED. The SARS-CoV-2 RNA is generally detectable in upper and lower respiratory specimens during the acute phase of infection. The lowest concentration of SARS-CoV-2 viral copies this assay can detect is 250 copies / mL. A negative result does not preclude SARS-CoV-2 infection and should not be used as the sole basis for treatment or other patient management decisions.  A negative result may occur with improper specimen collection / handling, submission of specimen other than nasopharyngeal swab, presence of viral mutation(s) within the areas targeted by this assay, and inadequate number of viral copies (<250 copies / mL). A negative result must be combined with clinical observations, patient history, and epidemiological information. Fact Sheet for Patients:    StrictlyIdeas.no Fact Sheet for Healthcare Providers: BankingDealers.co.za This test is not yet approved or cleared  by the [place] FDA and has been authorized for detection and/or diagnosis of SARS-CoV-2 by FDA under an Emergency Use Authorization (EUA).  This EUA will remain in effect (meaning this test can be used) for the duration of the COVID-19 declaration under Section 564(b)(1) of the Act, 21 U.S.C. section 360bbb-3(b)(1), unless the authorization is terminated or revoked sooner. Performed at [facility], [address] [place], [place] [address]   Urine Culture     Status: Abnormal   Collection Time: 08/15/19  1:05 PM   Specimen: Urine, Random  Result Value Ref Range Status   Specimen Description   Final    URINE, RANDOM Performed at [facility], [address], [place], [place] [address]    Special Requests   Final    NONE Performed at [facility], [address] [place], [place] [address]    Culture 40,000 COLONIES/mL ESCHERICHIA COLI (A)  Final   Report Status 08/17/2019 FINAL  Final   Organism ID, Bacteria ESCHERICHIA COLI (A)  Final      Susceptibility   Escherichia coli - MIC*    AMPICILLIN 16 INTERMEDIATE Intermediate     CEFAZOLIN <=4 SENSITIVE Sensitive     CEFTRIAXONE <=1 SENSITIVE Sensitive     CIPROFLOXACIN >=4 RESISTANT Resistant     GENTAMICIN <=1 SENSITIVE Sensitive     IMIPENEM <=0.25 SENSITIVE Sensitive     NITROFURANTOIN <=16 SENSITIVE Sensitive     TRIMETH/SULFA <=20 SENSITIVE Sensitive     AMPICILLIN/SULBACTAM 8 SENSITIVE Sensitive     PIP/TAZO <=4 SENSITIVE Sensitive     * 40,000 COLONIES/mL ESCHERICHIA COLI     Labs: BNP (last 3 results) No results for input(s): BNP in the last 8760 hours. Basic Metabolic Panel: Recent Labs  Lab 08/14/19 1205 08/15/19 0402 08/16/19 0541 08/17/19 0505 08/18/19 0415  NA 138 138 137 136 136  K 3.2* 4.6 3.9 3.3*  4.0  CL 103 106 105 103 103  CO2 26 27 27 25 28   GLUCOSE 131* 119* 148* 153* 103*  BUN 9 9 12 14 14   CREATININE <0.30* <0.30* <0.30* 0.32* <0.30*  CALCIUM 8.1* 8.5* 9.3 9.4 9.8  MG 1.3* 1.4* 1.4* 1.4* 1.9  PHOS  --  UNABLE TO REPORT DUE TO ICTERUS  --  UNABLE TO REPORT DUE TO ICTERUS 3.3   Liver Function Tests: Recent Labs  Lab 08/14/19 1205 08/15/19 0402 08/16/19 0541 08/17/19 0505 08/18/19 0415  AST 97* 87* 76* 62* 58*  ALT 30 28 29 29 29   ALKPHOS 105 101 93 87 97  BILITOT 15.9* 16.4* 17.5* 17.8* 15.9*  PROT 6.7 6.2* 5.9* 5.9* 5.9*  ALBUMIN 2.3* 2.1* 2.1* 2.0* 2.2*   No results for input(s): LIPASE, AMYLASE in the last 168 hours. Recent Labs  Lab 08/13/19 2210 08/15/19 0402 08/16/19 0541 08/17/19 0505  AMMONIA 80* 68* 46* 30   CBC: Recent Labs  Lab 08/13/19 2210 08/14/19 0324 08/16/19 0541 08/18/19 0415  WBC 6.5 5.3 8.8 13.4*  NEUTROABS 3.9  --  6.9 9.3*  HGB 10.4* 10.4* 10.0* 9.2*  HCT 29.7* 30.6* 29.3* 26.6*  MCV 99.0 102.7* 103.5* 103.9*  PLT 72* 63* 79* 92*   Cardiac Enzymes: No results for input(s): CKTOTAL, CKMB, CKMBINDEX, TROPONINI in the last 168 hours. BNP: Invalid input(s): POCBNP CBG: No results for input(s): GLUCAP in the last 168 hours. D-Dimer No results for input(s): DDIMER in the last 72 hours. Hgb A1c No results for input(s): HGBA1C in the last 72 hours. Lipid Profile No results for input(s): CHOL, HDL, LDLCALC, TRIG, CHOLHDL, LDLDIRECT in the last 72 hours. Thyroid function studies No results for input(s): TSH, T4TOTAL, T3FREE, THYROIDAB in the last 72 hours.  Invalid input(s): FREET3 Anemia work up No results for input(s): VITAMINB12, FOLATE, FERRITIN, TIBC, IRON, RETICCTPCT in the last 72 hours. Urinalysis    Component Value Date/Time   COLORURINE ORANGE (A) 08/15/2019 1305   APPEARANCEUR CLOUDY (A) 08/15/2019 1305   LABSPEC 1.016 08/15/2019 1305   PHURINE  08/15/2019 1305    TEST NOT REPORTED DUE TO COLOR INTERFERENCE OF  URINE PIGMENT   GLUCOSEU (A) 08/15/2019 1305    TEST NOT REPORTED DUE TO COLOR INTERFERENCE OF URINE PIGMENT   HGBUR (A) 08/15/2019 1305    TEST NOT REPORTED DUE TO COLOR INTERFERENCE OF URINE PIGMENT   BILIRUBINUR (A) 08/15/2019 1305  TEST NOT REPORTED DUE TO COLOR INTERFERENCE OF URINE PIGMENT   KETONESUR (A) 08/15/2019 1305    TEST NOT REPORTED DUE TO COLOR INTERFERENCE OF URINE PIGMENT   PROTEINUR (A) 08/15/2019 1305    TEST NOT REPORTED DUE TO COLOR INTERFERENCE OF URINE PIGMENT   NITRITE (A) 08/15/2019 1305    TEST NOT REPORTED DUE TO COLOR INTERFERENCE OF URINE PIGMENT   LEUKOCYTESUR (A) 08/15/2019 1305    TEST NOT REPORTED DUE TO COLOR INTERFERENCE OF URINE PIGMENT   Sepsis Labs Invalid input(s): PROCALCITONIN,  WBC,  LACTICIDVEN Microbiology Recent Results (from the past 240 hour(s))  SARS Coronavirus 2 by RT PCR (hospital order, performed in [facility] hospital lab) Nasopharyngeal Nasopharyngeal Swab     Status: None   Collection Time: 08/13/19 11:40 PM   Specimen: Nasopharyngeal Swab  Result Value Ref Range Status   SARS Coronavirus 2 NEGATIVE NEGATIVE Final    Comment: (NOTE) SARS-CoV-2 target nucleic acids are NOT DETECTED. The SARS-CoV-2 RNA is generally detectable in upper and lower respiratory specimens during the acute phase of infection. The lowest concentration of SARS-CoV-2 viral copies this assay can detect is 250 copies / mL. A negative result does not preclude SARS-CoV-2 infection and should not be used as the sole basis for treatment or other patient management decisions.  A negative result may occur with improper specimen collection / handling, submission of specimen other than nasopharyngeal swab, presence of viral mutation(s) within the areas targeted by this assay, and inadequate number of viral copies (<250 copies / mL). A negative result must be combined with clinical observations, patient history, and epidemiological information. Fact Sheet for  Patients:   StrictlyIdeas.no Fact Sheet for Healthcare Providers: BankingDealers.co.za This test is not yet approved or cleared  by the [place] FDA and has been authorized for detection and/or diagnosis of SARS-CoV-2 by FDA under an Emergency Use Authorization (EUA).  This EUA will remain in effect (meaning this test can be used) for the duration of the COVID-19 declaration under Section 564(b)(1) of the Act, 21 U.S.C. section 360bbb-3(b)(1), unless the authorization is terminated or revoked sooner. Performed at [facility], [address] [place], [place] [address]   Urine Culture     Status: Abnormal   Collection Time: 08/15/19  1:05 PM   Specimen: Urine, Random  Result Value Ref Range Status   Specimen Description   Final    URINE, RANDOM Performed at [facility], [address] [place], [place] [address]    Special Requests   Final    NONE Performed at [facility], [place], [place] [address]    Culture 40,000 COLONIES/mL ESCHERICHIA COLI (A)  Final   Report Status 08/17/2019 FINAL  Final   Organism ID, Bacteria ESCHERICHIA COLI (A)  Final      Susceptibility   Escherichia coli - MIC*    AMPICILLIN 16 INTERMEDIATE Intermediate     CEFAZOLIN <=4 SENSITIVE Sensitive     CEFTRIAXONE <=1 SENSITIVE Sensitive     CIPROFLOXACIN >=4 RESISTANT Resistant     GENTAMICIN <=1 SENSITIVE Sensitive     IMIPENEM <=0.25 SENSITIVE Sensitive     NITROFURANTOIN <=16 SENSITIVE Sensitive     TRIMETH/SULFA <=20 SENSITIVE Sensitive     AMPICILLIN/SULBACTAM 8 SENSITIVE Sensitive     PIP/TAZO <=4 SENSITIVE Sensitive     * 40,000 COLONIES/mL ESCHERICHIA COLI    Time coordinating discharge: Over 30 minutes  SIGNED:  Lorella Nimrod, MD  [facility] 08/18/2019,  3:24 PM  If 7PM-7AM, please contact night-coverage [email or web address]  This record has been created using TEFL teacher. Errors have been sought and corrected,but may not always be located. Such creation errors do not reflect on the standard of care.

## 2019-08-18 NOTE — Plan of Care (Signed)
The patient has been discharged. IV removed. Education provided and the teach back method has been used. The patient has demonstrated understanding of medications to take at home and appointments to follow. The patient has been transfer via wheelchair.  Problem: Education: Goal: Knowledge of General Education information will improve Description: Including pain rating scale, medication(s)/side effects and non-pharmacologic comfort measures Outcome: Completed/Met   Problem: Health Behavior/Discharge Planning: Goal: Ability to manage health-related needs will improve Outcome: Completed/Met   Problem: Clinical Measurements: Goal: Ability to maintain clinical measurements within normal limits will improve Outcome: Completed/Met Goal: Will remain free from infection Outcome: Completed/Met Goal: Diagnostic test results will improve Outcome: Completed/Met Goal: Respiratory complications will improve Outcome: Completed/Met Goal: Cardiovascular complication will be avoided Outcome: Completed/Met   Problem: Activity: Goal: Risk for activity intolerance will decrease Outcome: Completed/Met   Problem: Nutrition: Goal: Adequate nutrition will be maintained Outcome: Completed/Met   Problem: Coping: Goal: Level of anxiety will decrease Outcome: Completed/Met   Problem: Elimination: Goal: Will not experience complications related to bowel motility Outcome: Completed/Met Goal: Will not experience complications related to urinary retention Outcome: Completed/Met   Problem: Pain Managment: Goal: General experience of comfort will improve Outcome: Completed/Met   Problem: Safety: Goal: Ability to remain free from injury will improve Outcome: Completed/Met   Problem: Skin Integrity: Goal: Risk for impaired skin integrity will decrease Outcome: Completed/Met

## 2019-08-18 NOTE — Consult Note (Signed)
Consultation Note Date: 08/18/2019   Patient Name: Lisa Dennis  DOB: [birth year]  MRN: [id number]  Age / Sex: 54 y.o., female  PCP: Rusty Aus, MD Referring Physician: No att. providers found  Reason for Consultation: Establishing goals of care  HPI/Patient Profile: 54 y.o. female  with past medical history of ETOH abuse, liver cirrhosis, alcohol hepatitis, s/p TIPS procedure admitted on 08/13/2019 with dizziness and altered mental status. Patient has been trying to quit drinking alcohol but admits to 2 drinks prior to hospitalization. GI following. Patient with worsening liver function, elevated bilirubin, coagulopathy, thrombocytopenia, severe hypoalbuminemia. Receiving steroids, lactulose, and rifaximin. Maddrey score of 55. Meld score of 24. Poor long-term prognosis. Palliative medicine consultation for goals of care.   Clinical Assessment and Goals of Care:  I have reviewed medical records, discussed with care team, and met with patient at bedside to discuss goals of care. Lisa Dennis is awake, alert, oriented and able to participate in discussion.   I introduced Palliative Medicine as specialized medical care for people living with serious illness. It focuses on providing relief from the symptoms and stress of a serious illness. The goal is to improve quality of life for both the patient and the family.  We discussed a brief life review of the patient. Patient shares that she is divorced and her husband and daughter live in [place]. She moved to [place] a few years ago following the divorce. She admits to poor support system locally because majority of her friends still 'party.' She shares that her mother is a stressor to her and only her emergency contact "if I am dying." Her mother visits from [place]. (most recently 2 weeks ago) but drinks alcohol in front of her.   Patient has been trying to stop  drinking but unfortunately bad influences. She admits to drinking on Thursday before admission, 5/20.   Lisa Dennis shares that she has one supportive friend locally and they are going to try to help each other out. Lisa Dennis will go with this friend to counseling and this friend will attend [place] meetings with Lisa Dennis. Lisa Dennis speaks of frustration with not knowing how serious her condition was until this admission and different answers from different providers about her condition.   Discussed in detail course of hospitalization including diagnoses, interventions, plan of care, medications. She shares her understanding of "if I drink another drop, I could die" based on conversations with providers. She is tearful during discussion.   Lisa Dennis asks how much time she may have and wishes for me to discuss prognosis based on MELD score. Explained that her MELD score is currently 24 and this indicated 19.6% estimated 106-monthmortality. Emotional support provided. [facility] We discussed that unfortunately she is not a transplant candidate currently due to recent ETOH use but importance of sobriety moving forward. Also educated on importance with GI specialist f/u and compliance with medications.   JMartiniqueis motivated to maintain sobriety, follow-up with specialists, and comply  with medication management in hopes of stability and more time. She speaks of "doing all the right things to give me a longer chance." She speaks of "extreme guilt" that she did this to herself by drinking. She plans to follow-up with a Christian AA group for support with sobriety. We discussed positive influences including activities such as her love for arts and crafts.   Advanced directives briefly discussed. Patient tells me she has a documented living will (unsure if it is durable or includes healthcare POA). She tells me her mother is not POA but does not go into detail of who  she has documented as her POA. She is agreeable for outpatient palliative follow-up for further support.   Lisa Dennis does ask what it will be like to die from cirrhosis. Compassionately explained complications from decompensated liver cirrhosis including risks with bleeding, recurrent ascites, possible renal failure. We discussed 'hoping for the best but also preparing for the worst.' Discussed quality of life and relief from suffering if/when her condition further declines. Encouraged early discussions regarding her wishes with trusting decision maker.   Lisa Dennis is eager for discharge today and waiting to hear from attending and RN.   Questions and concerns were addressed. PMT contact information given. Emotional support provided.   Discussed with RN in detail. Updated Dr. Reesa Chew via secure chat for outpatient palliative referral in discharge summary.    SUMMARY OF RECOMMENDATIONS    Continue full code/full scope treatment. Ongoing palliative discussions with outpatient palliative provider. Patient agreeable.  Outpatient hepatologist appointment at [facility] next Thursday, 08/26/19.  Continue current medical management. Appreciate GI recommendations.   Patient understands poor long-term prognosis based on MELD score but is hopeful for sobriety moving forward. Planning to attend AA meetings with supportive friend. Educated on importance of sobriety, GI follow-up, and medication compliance. She understands she is not a liver transplant candidate with recent ETOH use.   Patient has documented living will. Outpatient palliative referral for further [place] discussions and code status discussions. May benefit from MOST form following hepatologist appointment.   Possible discharge home today.   Code Status/Advance Care Planning:  Full code   Symptom Management:   Per attending  Palliative Prophylaxis:   Bowel Regimen and Frequent Pain Assessment  Additional Recommendations (Limitations, Scope,  Preferences):  Full Scope Treatment  Psycho-social/Spiritual:   Desire for further Chaplaincy support: yes  Additional Recommendations: Caregiving  Support/Resources, Compassionate Wean Education and Education on Hospice  Prognosis:   Poor long-term prognosis with decompensated liver cirrhosis, alcohol hepatitis, not a transplant candidate with recent ETOH abuse.   Discharge Planning: Home with outpatient palliative referral     Primary Diagnoses: Present on Admission: . Hepatic encephalopathy ([place]) . Decompensated hepatic cirrhosis ([place]) . Alcoholic cirrhosis of liver with ascites ([place]) . Hypomagnesemia . Thrombocytopenia ([place])   I have reviewed the medical record, interviewed the patient and family, and examined the patient. The following aspects are pertinent.  Past Medical History:  Diagnosis Date  . Alcohol abuse   . Anxiety   . Cirrhosis ([place])    Social History   Socioeconomic History  . Marital status: Divorced    Spouse name: Not on file  . Number of children: 1  . Years of education: Not on file  . Highest education level: Bachelor's degree (e.g., BA, AB, BS)  Occupational History  . Not on file  Tobacco Use  . Smoking status: Former Research scientist (life sciences)  . Smokeless tobacco: Never Used  Substance and Sexual Activity  . Alcohol  use: Yes    Alcohol/week: 10.0 standard drinks    Types: 10 Glasses of wine per week    Comment: x 2 drinks today  . Drug use: Never  . Sexual activity: Not Currently  Other Topics Concern  . Not on file  Social History Narrative   Pt recently relocated from [place] 7weeks ago.   Social Determinants of Health   Financial Resource Strain: Low Risk   . Difficulty of Paying Living Expenses: Not hard at all  Food Insecurity: No Food Insecurity  . Worried About Running Out of Food in the Last Year: Never true  . Ran Out of Food in the Last Year: Never true  Transportation Needs: No Transportation Needs  . Lack of Transportation  (Medical): No  . Lack of Transportation (Non-Medical): No  Physical Activity: Inactive  . Days of Exercise per Week: 0 days  . Minutes of Exercise per Session: 0 min  Stress: Stress Concern Present  . Feeling of Stress : Very much  Social Connections: Moderately Isolated  . Frequency of Communication with Friends and Family: More than three times a week  . Frequency of Social Gatherings with Friends and Family: Once a week  . Attends Religious Services: Never  . Active Member of Clubs or Organizations: No  . Attends Club or Organization Meetings: Never  . Marital Status: Divorced   History reviewed. No pertinent family history. Scheduled Meds: . feeding supplement (ENSURE ENLIVE)  237 mL Oral BID BM  . folic acid  1 mg Oral Daily  . lactulose  30 g Oral TID  . LORazepam  0-4 mg Intravenous Q12H  . multivitamin with minerals  1 tablet Oral Daily  . pantoprazole  40 mg Oral Daily  . prednisoLONE  40 mg Oral Daily  . rifaximin  550 mg Oral BID  . thiamine  100 mg Oral Daily   Continuous Infusions: . sodium chloride     PRN Meds:.sodium chloride, magnesium hydroxide, ondansetron **OR** ondansetron (ZOFRAN) IV, traMADol Medications Prior to Admission:  Prior to Admission medications   Medication Sig Start Date End Date Taking? Authorizing Provider  ciprofloxacin (CIPRO) 250 MG tablet Take 250 mg by mouth 2 (two) times daily. 07/30/19  Yes [provider]  folic acid (FOLVITE) 1 MG tablet Take 1 tablet (1 mg total) by mouth daily. 01/30/19  Yes Stafford, Phillip, MD  lactulose (CHRONULAC) 10 GM/15ML solution Take 30 mLs (20 g total) by mouth 3 (three) times daily. Hold for more than 2 loose bowel movements. 06/06/19  Yes Amin, Sumayya, MD  LORazepam (ATIVAN) 0.5 MG tablet Take 0.5 mg by mouth daily as needed for anxiety.  05/20/19  Yes [provider]  Multiple Vitamin (MULTIVITAMIN WITH MINERALS) TABS tablet Take 1 tablet by mouth daily. 05/29/19  Yes Swayze, Ava, DO   nadolol (CORGARD) 20 MG tablet Take 20 mg by mouth daily.   Yes [provider]  pantoprazole (PROTONIX) 40 MG tablet Take 40 mg by mouth daily.   Yes [provider]  thiamine 100 MG tablet Take 1 tablet (100 mg total) by mouth daily. 05/29/19  Yes Swayze, Ava, DO  oxyCODONE-acetaminophen (PERCOCET) 7.5-325 MG tablet Take 1 tablet by mouth every 6 (six) hours as needed. Patient not taking: Reported on 06/06/2019 06/05/19   Smith, Ronald K, PA-C   Allergies  Allergen Reactions  . Benadryl [Diphenhydramine] Other (See Comments)    Altered Mental Status  . Venlafaxine Hcl Er Itching  . Penicillins Hives and Other (  See Comments)    Did it involve swelling of the face/tongue/throat, SOB, or low BP? No Did it involve sudden or severe rash/hives, skin peeling, or any reaction on the inside of your mouth or nose? Unknown Did you need to seek medical attention at a hospital or doctor's office? Unknown When did it last happen?unknown If all above answers are "NO", may proceed with cephalosporin use.  **patient tolerates ceftriaxone - Aug 2020   . Clindamycin/Lincomycin Swelling  . Latex Hives  . Morphine And Related Hives  . Sulfa Antibiotics Hives  . Tetracyclines & Related Hives   Review of Systems  All other systems reviewed and are negative.   Physical Exam Vitals and nursing note reviewed.  Constitutional:      General: She is awake.     Appearance: She is ill-appearing.  Eyes:     General: Scleral icterus present.  Pulmonary:     Effort: No tachypnea, accessory muscle usage or respiratory distress.  Abdominal:     Tenderness: There is no abdominal tenderness.  Skin:    Coloration: Skin is jaundiced.  Neurological:     Mental Status: She is alert.  Psychiatric:        Mood and Affect: Mood normal.        Speech: Speech normal.        Behavior: Behavior normal.        Cognition and Memory: Cognition normal.    Vital Signs: BP 124/74 (BP Location: Right  Arm)   Pulse (!) 101   Temp (!) 97.5 F (36.4 C) (Oral)   Resp 16   Ht 5' 2" (1.575 m)   Wt 56.7 kg   SpO2 95%   BMI 22.86 kg/m  Pain Scale: 0-10   Pain Score: Asleep   SpO2: SpO2: 95 % O2 Device:SpO2: 95 % O2 Flow Rate: .   IO: Intake/output summary:   Intake/Output Summary (Last 24 hours) at 08/18/2019 1905 Last data filed at 08/18/2019 1430 Gross per 24 hour  Intake 360 ml  Output --  Net 360 ml    LBM: Last BM Date: 08/18/19 Baseline Weight: Weight: 56.7 kg Most recent weight: Weight: 56.7 kg     Palliative Assessment/Data: 70%   Flowsheet Rows     Most Recent Value  Intake Tab  Referral Department  Hospitalist  Unit at Time of Referral  Med/Surg Unit  Palliative Care Primary Diagnosis  Other (Comment) [decompensated liver cirrhosis]  Palliative Care Type  New Palliative care  Date first seen by Palliative Care  08/18/19  Clinical Assessment  Palliative Performance Scale Score  70%  Psychosocial & Spiritual Assessment  Palliative Care Outcomes  Patient/Family meeting held?  Yes  Who was at the meeting?  patient  Palliative Care Outcomes  Clarified goals of care, Provided end of life care assistance, Provided psychosocial or spiritual support, Linked to palliative care logitudinal support, ACP counseling assistance      Time In: 4401 Time Out: 1400 Time Total: 75 Greater than 50%  of this time was spent counseling and coordinating care related to the above assessment and plan.  Signed by:  Ihor Dow, DNP, FNP-C Palliative Medicine Team  Phone: [phone] Fax: [phone]   Please contact Palliative Medicine Team phone at [phone] for questions and concerns.  For individual provider: See Shea Evans

## 2019-08-18 NOTE — Progress Notes (Signed)
GI Inpatient Follow-up Note  Subjective:  Patient seen in follow-up for alcoholic hepatitis on oral prednisolone 40 mg daily. No acute events overnight. Patient reports she is feeling better. She denies fevers, altered mental status, nausea, vomiting, abdominal pain, hematochezia, or melena. She was seen by palliative care today and reports she is upset about hearing the poor prognosis given her severe alcoholic hepatitis and liver disease. She has followed up scheduled for next Thursday with Dr. Posey Pronto at [facility]. Labs today - INR 2.1, tbili 15.9, WBC 13.4 with left shift  Scheduled Inpatient Medications:  . feeding supplement (ENSURE ENLIVE)  237 mL Oral BID BM  . folic acid  1 mg Oral Daily  . lactulose  30 g Oral TID  . LORazepam  0-4 mg Intravenous Q12H  . multivitamin with minerals  1 tablet Oral Daily  . pantoprazole  40 mg Oral Daily  . prednisoLONE  40 mg Oral Daily  . rifaximin  550 mg Oral BID  . thiamine  100 mg Oral Daily    Continuous Inpatient Infusions:   . sodium chloride      PRN Inpatient Medications:  sodium chloride, magnesium hydroxide, ondansetron **OR** ondansetron (ZOFRAN) IV, traMADol  Review of Systems: Constitutional: Weight is stable.  Eyes: No changes in vision. ENT: No oral lesions, sore throat.  GI: see HPI.  Heme/Lymph: No easy bruising.  CV: No chest pain.  GU: No hematuria.  Integumentary: No rashes.  Neuro: No headaches.  Psych: No depression/anxiety.  Endocrine: No heat/cold intolerance.  Allergic/Immunologic: No urticaria.  Resp: No cough, SOB.  Musculoskeletal: No joint swelling.    Physical Examination: BP 124/74 (BP Location: Right Arm)   Pulse (!) 101   Temp (!) 97.5 F (36.4 C) (Oral)   Resp 16   Ht 5\' 2"  (1.575 m)   Wt 56.7 kg   SpO2 95%   BMI 22.86 kg/m  Gen: NAD, alert and oriented x 4 HEENT: PEERLA, EOMI, +jaundice Neck: supple, no JVD or thyromegaly Chest: CTA bilaterally, no wheezes, crackles, or other  adventitious sounds CV: RRR, no m/g/c/r Abd: soft, NT, ND, +BS in all four quadrants; no HSM, guarding, ridigity, or rebound tenderness Ext: no edema, well perfused with 2+ pulses, Skin: no rash or lesions noted Lymph: no LAD  Data: Lab Results  Component Value Date   WBC 13.4 (H) 08/18/2019   HGB 9.2 (L) 08/18/2019   HCT 26.6 (L) 08/18/2019   MCV 103.9 (H) 08/18/2019   PLT 92 (L) 08/18/2019   Recent Labs  Lab 08/14/19 0324 08/16/19 0541 08/18/19 0415  HGB 10.4* 10.0* 9.2*   Lab Results  Component Value Date   NA 136 08/18/2019   K 4.0 08/18/2019   CL 103 08/18/2019   CO2 28 08/18/2019   BUN 14 08/18/2019   CREATININE <0.30 (L) 08/18/2019   Lab Results  Component Value Date   ALT 29 08/18/2019   AST 58 (H) 08/18/2019   ALKPHOS 97 08/18/2019   BILITOT 15.9 (H) 08/18/2019   Recent Labs  Lab 08/18/19 0820  INR 2.1*   Assessment:  1. Alcoholic hepatitis - Severe - on PO steroids.   2. Hepatic encephalopathy - secondary to Child Pugh Class "C" cirrhosis and TIPS procedure. Patient would benefit from Xifaxan at home if she could get some medication assistance. Improved today on Lactulose and Xifaxan combination.  3. Thrombocytopenia and coagulopathy secondary to cirrhosis.   Plan:  1. Bilirubin is improving with oral glucocorticoid treatment 2. She is  tolerating low-sodium diet without any difficulty and passing stools 3. No overt encephalopathy 4. She should continue oral prednisolone 40 mg daily for a total of 28-days and then slow taper off 5. She has appointment in place with her hepatologist at [facility] - Dr. Posey Pronto 6. Stable for discharge today with close outpatient follow-up 7. We discussed severity of her liver disease and importance of complete alcoholic cessation   Please call with questions or concerns.    Octavia Bruckner, PA-C [facility] [phone] (Cell)

## 2019-08-20 ENCOUNTER — Telehealth: Payer: Self-pay | Admitting: Adult Health Nurse Practitioner

## 2019-08-20 NOTE — Telephone Encounter (Signed)
Spoke with patient regarding Palliative services and all questions were answered and she was in agreement with this.  I have scheduled an In-person Consult for 09/03/19 @ 2:30 PM.

## 2019-09-03 ENCOUNTER — Other Ambulatory Visit: Payer: PRIVATE HEALTH INSURANCE | Admitting: Adult Health Nurse Practitioner

## 2019-09-03 ENCOUNTER — Other Ambulatory Visit: Payer: Self-pay

## 2019-09-03 DIAGNOSIS — K7030 Alcoholic cirrhosis of liver without ascites: Secondary | ICD-10-CM

## 2019-09-03 DIAGNOSIS — Z515 Encounter for palliative care: Secondary | ICD-10-CM

## 2019-09-04 NOTE — Progress Notes (Signed)
[place] Consult Note Telephone: [phone]  Fax: [phone]  PATIENT NAME: Lisa Dennis DOB: 1965-08-28 MRN: [id number]  PRIMARY CARE PROVIDER:   Rusty Aus, MD  REFERRING PROVIDER:  Rusty Aus, MD [address],  [place] [address]  RESPONSIBLE PARTY:     ASSESSMENT:        RECOMMENDATIONS and PLAN:  1.  Advanced care planning.  Patient is full code.  Patient is interested in pursuing liver transplant  2.  Alcoholic cirrhosis of the liver.  Patient does have grade 1 varices and was treated for variceal bleed in August 2020.  Patient has had TIPS procedure done in 2019.  Patient has had 8 hospital visits this year.  In January was treated for GI bleed.  For hospitalizations were due to hepatic encephalopathy.  Last hospitalization was 5/21 through 08/18/2019 and she was started on prednisone 40 mg daily and her rifaximin was increased to twice daily patient has had struggles with quitting alcohol.  After last hospital visit she has done much better and has been attending [place] meetings.  It has been discussed with her that with her meld score of 24 that she had a 19.6% estimated mortality of 3 months.  On 08/20/2019 she was seen by Dr. Posey Pronto with hepatology.  States that she was instructed that if she had 6 months of sobriety that she could get on liver transplant list.  Patient states that she wants to live and that she is trying her best to get on the transplant list.  Was started on a Campral to reduce alcohol cravings.  This seems to be helping.  She does state that conversations with her mother can be very stressful at this stress makes her want to drink so she is trying to avoid these types of conversations with her mother.  She would like help with accountability to continue with her AA meetings and to stay off of alcohol.  Patient also having difficulty with the isolation she  does not work and is home alone.  Would like resources for activities that she can get involved in to keep her more active.  We will reach out to social worker for resources that could help her with this.  Have reached out to PCP for referral for therapist for counseling.  3.  Nutrition.  Patient states that she has gained weight since being started on the prednisone.  States she generally weighs around 117 pounds and is now up to 130 pounds.  She is trying to be careful of what she eats due to her liver disease and she also has problems with frequent diarrhea with taking lactulose 3 times a day.  She also has allergies that limits what she can eat.  Would like nutritionist referral to help her determine which foods will be best for her disease processes and her allergies.  Have reached out to PCP for referral for nutritionist to help her with proper diet for her liver disease and limitations related to allergies.  Palliative will continue to monitor for symptom management/decline and make recommendations as needed.  Have next appointment in 4 weeks.  Encouraged to call sooner with any questions or concerns.  I spent 90 minutes providing this consultation,  from 2:30 to 4:00 including time spent with patient, chart review, provider coordination, documentation. More than 50% of the time in this consultation was spent coordinating communication.   HISTORY OF  PRESENT ILLNESS:  Lisa Dennis is a 54 y.o. year old female with multiple medical problems including cirrhosis of the liver, alcohol abuse, esophageal varices postbanding, anxiety. Palliative Care was asked to help address goals of care.   CODE STATUS: full code  PPS: 70% HOSPICE ELIGIBILITY/DIAGNOSIS: TBD  PHYSICAL EXAM:  BP 132/68  HR 74  O2 99% on RA General: NAD, frail appearing, still having jaundice and icterus Cardiovascular: regular rate and rhythm; does have 2/6 systolic murmur Pulmonary: lung sounds clear; normal respiratory  effort Abdomen: soft, nontender, + bowel sounds GU: no suprapubic tenderness Extremities: trace edema to feet, no joint deformities Skin: no rashes on exposed skin Neurological: Weakness but otherwise nonfocal   PAST MEDICAL HISTORY:  Past Medical History:  Diagnosis Date  . Alcohol abuse   . Anxiety   . Cirrhosis ([place])     SOCIAL HX:  Social History   Tobacco Use  . Smoking status: Former Research scientist (life sciences)  . Smokeless tobacco: Never Used  Substance Use Topics  . Alcohol use: Yes    Alcohol/week: 10.0 standard drinks    Types: 10 Glasses of wine per week    Comment: x 2 drinks today    ALLERGIES:  Allergies  Allergen Reactions  . Benadryl [Diphenhydramine] Other (See Comments)    Altered Mental Status  . Venlafaxine Hcl Er Itching  . Penicillins Hives and Other (See Comments)    Did it involve swelling of the face/tongue/throat, SOB, or low BP? No Did it involve sudden or severe rash/hives, skin peeling, or any reaction on the inside of your mouth or nose? Unknown Did you need to seek medical attention at a hospital or doctor's office? Unknown When did it last happen?unknown If all above answers are "NO", may proceed with cephalosporin use.  **patient tolerates ceftriaxone - Aug 2020   . Clindamycin/Lincomycin Swelling  . Latex Hives  . Morphine And Related Hives  . Sulfa Antibiotics Hives  . Tetracyclines & Related Hives     PERTINENT MEDICATIONS:  Outpatient Encounter Medications as of 09/03/2019  Medication Sig  . folic acid (FOLVITE) 1 MG tablet Take 1 tablet (1 mg total) by mouth daily.  Marland Kitchen lactulose (CHRONULAC) 10 GM/15ML solution Take 45 mLs (30 g total) by mouth 3 (three) times daily. Hold for more than 2 loose bowel movements.  Marland Kitchen LORazepam (ATIVAN) 0.5 MG tablet Take 0.5 mg by mouth daily as needed for anxiety.   . Multiple Vitamin (MULTIVITAMIN WITH MINERALS) TABS tablet Take 1 tablet by mouth daily.  . nadolol (CORGARD) 20 MG tablet Take 20 mg by mouth daily.    Marland Kitchen oxyCODONE-acetaminophen (PERCOCET) 7.5-325 MG tablet Take 1 tablet by mouth every 6 (six) hours as needed. (Patient not taking: Reported on 06/06/2019)  . pantoprazole (PROTONIX) 40 MG tablet Take 40 mg by mouth daily.  . prednisoLONE (PRELONE) 15 MG/5ML SOLN Take 13.3 mLs (40 mg total) by mouth daily.  . rifaximin (XIFAXAN) 550 MG TABS tablet Take 1 tablet (550 mg total) by mouth 2 (two) times daily.  Marland Kitchen thiamine 100 MG tablet Take 1 tablet (100 mg total) by mouth daily.   No facility-administered encounter medications on file as of 09/03/2019.     Toccara Alford Jenetta Downer, NP

## 2019-09-06 ENCOUNTER — Telehealth: Payer: Self-pay

## 2019-09-06 ENCOUNTER — Telehealth: Payer: Self-pay | Admitting: Adult Health Nurse Practitioner

## 2019-09-06 NOTE — Telephone Encounter (Signed)
Returned patient's call with concerns for lethargy.  Patient states that it just started yesterday.  Has had this in the past with her cirrhosis of the liver.  Has daily diarrhea associated with lactulose use.  Have suggested going to ER for evaluation.  States she wants to go to her AA meeting and then to [facility] for scheduled lab work.  Have suggested that if she is feeling well enough she can wait and have her blood work done and instructed her to call her PCP as well. Jakeb Lamping K. Olena Heckle NP

## 2019-09-06 NOTE — Telephone Encounter (Signed)
At request of Hanley Ben NP, phone call placed to PCP office to request a referral for therapist and also to a nutritionist.

## 2019-09-08 ENCOUNTER — Telehealth: Payer: Self-pay

## 2019-09-08 NOTE — Telephone Encounter (Signed)
Received call from [facility] at Dr. Ammie Ferrier office who shared that it needs to be determined what nutritionist and therapist the patient's insurance will cover. Dr. Sabra Heck in agreement to send referral when we can determine this.

## 2019-09-27 ENCOUNTER — Telehealth: Payer: Self-pay | Admitting: Adult Health Nurse Practitioner

## 2019-09-27 NOTE — Telephone Encounter (Signed)
Patient had sent email with concerns about palliative vs hospice.  Answered her questions and confirmed appointment for this Thursday. Kyston Gonce K. Olena Heckle NP

## 2019-09-30 ENCOUNTER — Other Ambulatory Visit: Payer: Self-pay

## 2019-09-30 ENCOUNTER — Other Ambulatory Visit: Payer: PRIVATE HEALTH INSURANCE | Admitting: Adult Health Nurse Practitioner

## 2019-09-30 DIAGNOSIS — K7030 Alcoholic cirrhosis of liver without ascites: Secondary | ICD-10-CM

## 2019-09-30 DIAGNOSIS — Z515 Encounter for palliative care: Secondary | ICD-10-CM

## 2019-09-30 NOTE — Progress Notes (Signed)
[facility] Consult Note Telephone: [phone]  Fax: [phone]  PATIENT NAME: Lisa Dennis DOB: 1965/06/09 MRN: [id number]  PRIMARY CARE PROVIDER:   Rusty Aus, MD  REFERRING PROVIDER:  Rusty Aus, MD [place] [place],  [place] [address]  RESPONSIBLE PARTY:   [employer] [phone]    RECOMMENDATIONS and PLAN:  1.  Advanced care planning.  Patient is a full code.  Patient is interested in pursuing liver transplant  2.  Alcoholic cirrhosis of the liver.  Patient continues to attend AA meetings.  Has been working with her insurance to find a nutritionist and a therapist.  Has found 1 nutritionist which she has an appointment on Monday at 2:00.  Her insurance company has stated that they have found a company, [employer], that takes her insurance and has a Engineer, maintenance (IT) and therapist that she can work with.  Patient states that she feels like she is getting stronger.  She still takes lactulose 15 mL 3 times daily and continues to have several episodes of watery stool daily.  Have suggested titrating her lactulose until she has 3-4 soft stools a day.  She is concerned that her ammonia levels will rise if she decreases her lactulose.  She states that she is going to bring this up with her PCP whom she has an appointment with tomorrow.  Her next appointment with her hepatologist is 10/18/2019.  3.  Support.  Patient states that she does feel alone.  She has not gotten approval from her hepatologist yet to get the Covid vaccination and with the new variant spreading she still wants to limit her time outside of the home and around others.  Have reminded her that I have sent her a list of support groups that she can look into.  She would really like to be able to do more activities and find friends outside of support for her disease.  Discussed that when she sets up with a therapist that the therapist might  be able to lead her into groups and activities that support her interests.  As stated above she has worked with her insurance company to find a therapist that does take her insurance.  Denies shortness of breath, cough, N/V, pain, headaches, dizziness, chest pain, palpitations.  Palliative will continue to monitor for symptom management/decline and make recommendations as needed.  Have next appointment in 6 weeks.  Encouraged to call sooner with any questions or concerns.  I spent 90 minutes providing this consultation,  from 9:30 to 11:00 including time spent with patient, chart review, provider coordination, documentation. More than 50% of the time in this consultation was spent coordinating communication.   HISTORY OF PRESENT ILLNESS:  Lisa Dennis is a 54 y.o. year old female with multiple medical problems including cirrhosis of the liver, alcohol abuse, esophageal varices postbanding, anxiety. Patient does have grade 1 varices and was treated for variceal bleed in August 2020.  Patient has had TIPS procedure done in 2019.Palliative Care was asked to help address goals of care.   CODE STATUS: full code  PPS: 70% HOSPICE ELIGIBILITY/DIAGNOSIS: TBD  PHYSICAL EXAM:  BP 134/76  HR 73  O2 98% on RA General: patient in NAD Cardiovascular: regular rate and rhythm; does have 2/6 systolic murmur Pulmonary: lung sounds clear; normal respiratory effort Abdomen: soft, nontender, + bowel sounds GU: no suprapubic tenderness Extremities: trace edema to feet, no joint deformities Skin: no rashes  on exposed skin Neurological: Weakness but otherwise nonfocal  PAST MEDICAL HISTORY:  Past Medical History:  Diagnosis Date   Alcohol abuse    Anxiety    Cirrhosis ([place])     SOCIAL HX:  Social History   Tobacco Use   Smoking status: Former Smoker   Smokeless tobacco: Never Used  Substance Use Topics   Alcohol use: Yes    Alcohol/week: 10.0 standard drinks    Types: 10 Glasses of  wine per week    Comment: x 2 drinks today    ALLERGIES:  Allergies  Allergen Reactions   Benadryl [Diphenhydramine] Other (See Comments)    Altered Mental Status   Venlafaxine Hcl Er Itching   Penicillins Hives and Other (See Comments)    Did it involve swelling of the face/tongue/throat, SOB, or low BP? No Did it involve sudden or severe rash/hives, skin peeling, or any reaction on the inside of your mouth or nose? Unknown Did you need to seek medical attention at a hospital or doctor's office? Unknown When did it last happen?unknown If all above answers are "NO", may proceed with cephalosporin use.  **patient tolerates ceftriaxone - Aug 2020    Clindamycin/Lincomycin Swelling   Latex Hives   Morphine And Related Hives   Sulfa Antibiotics Hives   Tetracyclines & Related Hives     PERTINENT MEDICATIONS:  Outpatient Encounter Medications as of 09/30/2019  Medication Sig   folic acid (FOLVITE) 1 MG tablet Take 1 tablet (1 mg total) by mouth daily.   lactulose (CHRONULAC) 10 GM/15ML solution Take 45 mLs (30 g total) by mouth 3 (three) times daily. Hold for more than 2 loose bowel movements.   LORazepam (ATIVAN) 0.5 MG tablet Take 0.5 mg by mouth daily as needed for anxiety.    Multiple Vitamin (MULTIVITAMIN WITH MINERALS) TABS tablet Take 1 tablet by mouth daily.   nadolol (CORGARD) 20 MG tablet Take 20 mg by mouth daily.   oxyCODONE-acetaminophen (PERCOCET) 7.5-325 MG tablet Take 1 tablet by mouth every 6 (six) hours as needed. (Patient not taking: Reported on 06/06/2019)   pantoprazole (PROTONIX) 40 MG tablet Take 40 mg by mouth daily.   prednisoLONE (PRELONE) 15 MG/5ML SOLN Take 13.3 mLs (40 mg total) by mouth daily.   rifaximin (XIFAXAN) 550 MG TABS tablet Take 1 tablet (550 mg total) by mouth 2 (two) times daily.   thiamine 100 MG tablet Take 1 tablet (100 mg total) by mouth daily.   No facility-administered encounter medications on file as of 09/30/2019.      Avah Bashor Jenetta Downer, NP

## 2019-10-02 ENCOUNTER — Ambulatory Visit: Payer: PRIVATE HEALTH INSURANCE | Attending: Internal Medicine

## 2019-10-02 ENCOUNTER — Ambulatory Visit: Payer: PRIVATE HEALTH INSURANCE

## 2019-10-02 DIAGNOSIS — Z23 Encounter for immunization: Secondary | ICD-10-CM

## 2019-10-02 NOTE — Progress Notes (Signed)
   Covid-19 Vaccination Clinic  Name:  Zayneb Baucum    MRN: [id number] DOB: [birth year]  10/02/2019  Ms. Yumi Insalaco was observed post Covid-19 immunization for 15 minutes without incident. She was provided with Vaccine Information Sheet and instruction to access the V-Safe system.   Ms. Demara Lover was instructed to call 911 with any severe reactions post vaccine: Marland Kitchen Difficulty breathing  . Swelling of face and throat  . A fast heartbeat  . A bad rash all over body  . Dizziness and weakness   Immunizations Administered    Name Date Dose VIS Date Route   Pfizer COVID-19 Vaccine 10/02/2019 10:50 AM 0.3 mL 05/19/2018 Intramuscular   Manufacturer: [place]   Lot: [id number]   [place]: [id number]

## 2019-10-19 ENCOUNTER — Ambulatory Visit: Payer: PRIVATE HEALTH INSURANCE | Attending: Internal Medicine

## 2019-10-19 DIAGNOSIS — Z23 Encounter for immunization: Secondary | ICD-10-CM

## 2019-10-19 NOTE — Progress Notes (Signed)
   Covid-19 Vaccination Clinic  Name:  Johnathan Tortorelli    MRN: [id number] DOB: 1965/05/04  10/19/2019  Ms. Madelene Kaatz was observed post Covid-19 immunization for 15 minutes without incident. She was provided with Vaccine Information Sheet and instruction to access the V-Safe system.   Ms. Tynlee Bayle was instructed to call 911 with any severe reactions post vaccine: Marland Kitchen Difficulty breathing  . Swelling of face and throat  . A fast heartbeat  . A bad rash all over body  . Dizziness and weakness   Immunizations Administered    Name Date Dose VIS Date Route   Pfizer COVID-19 Vaccine 10/19/2019  8:11 AM 0.3 mL 05/19/2018 Intramuscular   Manufacturer: [place]   Lot: [id number]   [place]: [id number]

## 2019-10-28 ENCOUNTER — Other Ambulatory Visit: Payer: PRIVATE HEALTH INSURANCE | Admitting: Adult Health Nurse Practitioner

## 2019-11-23 ENCOUNTER — Other Ambulatory Visit: Payer: PRIVATE HEALTH INSURANCE | Admitting: Adult Health Nurse Practitioner

## 2020-02-12 IMAGING — CT CT ANGIOGRAPHY ABDOMEN AND PELVIS WITH CONTRAST AND WITHOUT CONT
3 of 10 series · 9 of 46 positions shown, 15 images · IV contrast (omnipaque)
Comparison: MRI abdomen 10/21/2018

CLINICAL DATA: 53-year-old female with alcoholic cirrhosis,
esophageal varices and upper GI bleeding. Recent endoscopy did not
find evidence of recent bleeding from the grade 1 varices.

EXAM:
CT ANGIOGRAPHY ABDOMEN AND PELVIS WITH CONTRAST AND WITHOUT CONTRAST
TECHNIQUE: Multidetector CT imaging of the abdomen and pelvis was performed
using the standard protocol during bolus administration of
intravenous contrast. Multiplanar reconstructed images and MIPs were
obtained and reviewed to evaluate the vascular anatomy.
CONTRAST:  100mL OMNIPAQUE IOHEXOL 350 MG/ML SOLN

[Series 5: axial arterial · axial · arterial · 0.84mm/px · z∈[-1152,-1018]mm · 3 of 250 slices shown]
[im 17/250  soft-tissue]
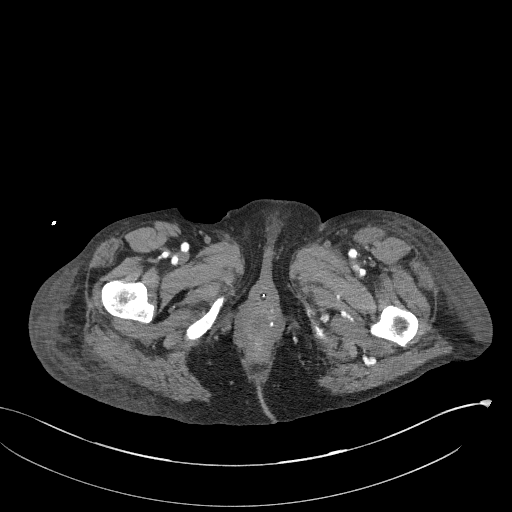
[im 50/250  soft-tissue]
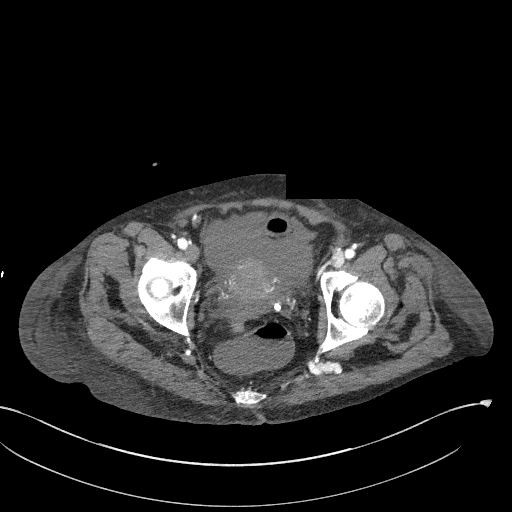
[im 84/250  soft-tissue]
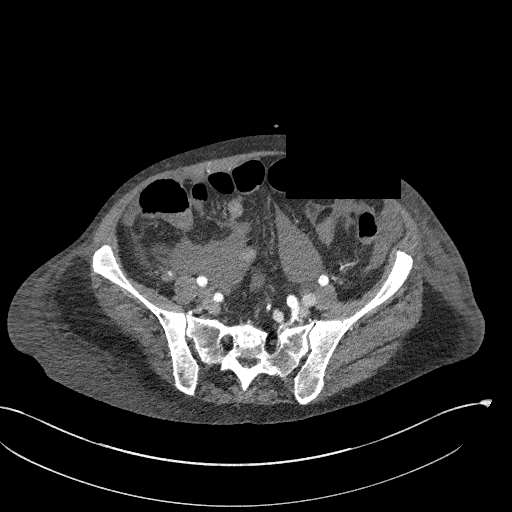

[Series 7: coronal mpr · coronal · 0.80mm/px · 2 of 126 slices shown, 3 images]
[im 42/126  soft-tissue]
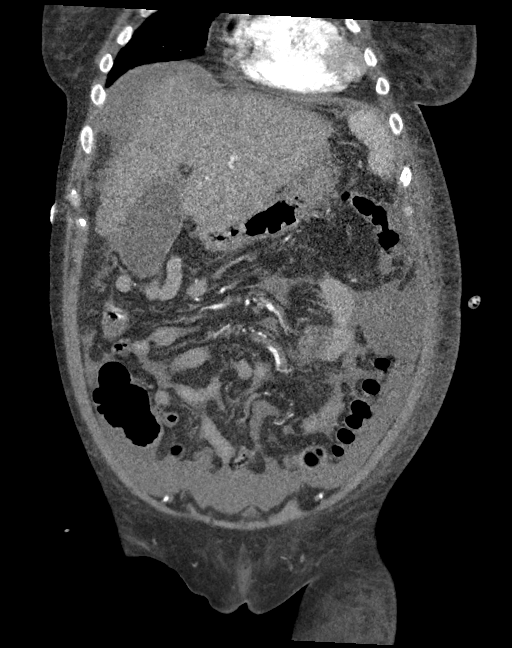
[im 42/126  bone]
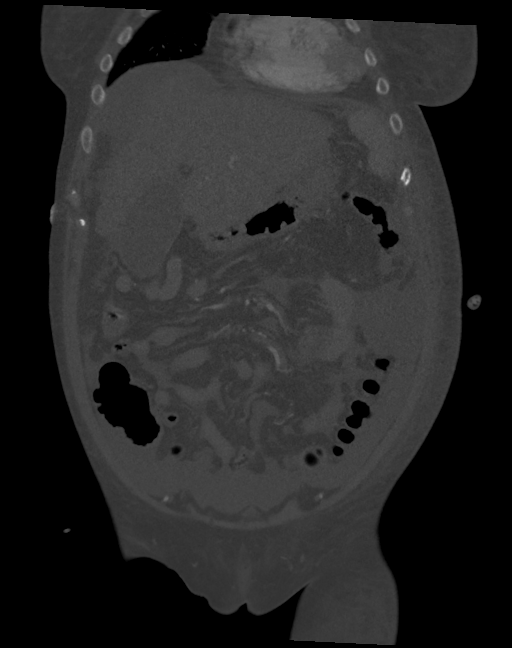
[im 84/126  soft-tissue]
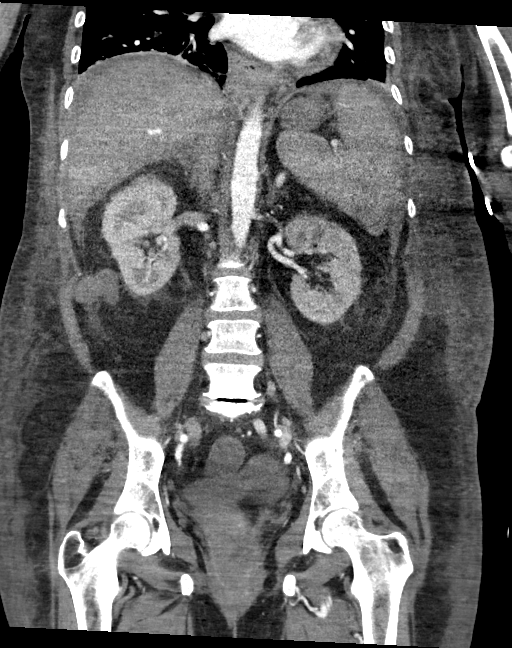

[Series 11: axial venous · axial · portal-venous · 0.72mm/px · z∈[-1086,-786]mm · 4 of 100 slices shown, 9 images]
[im 20/100  soft-tissue]
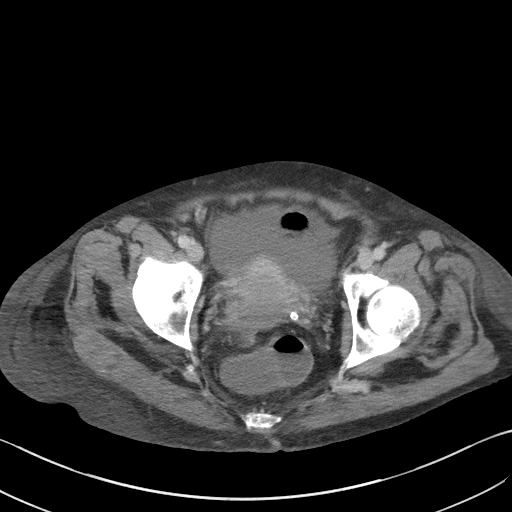
[im 20/100  lung]
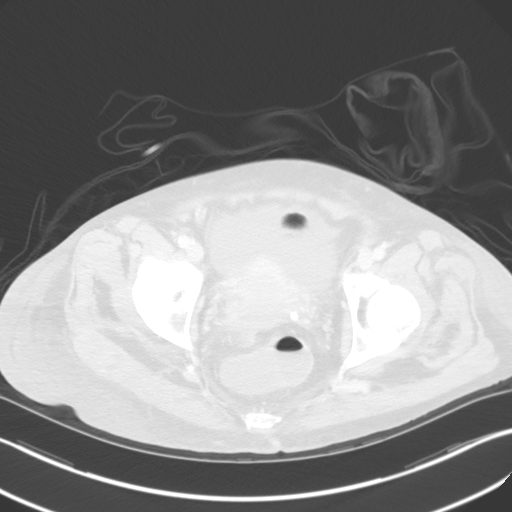
[im 20/100  bone]
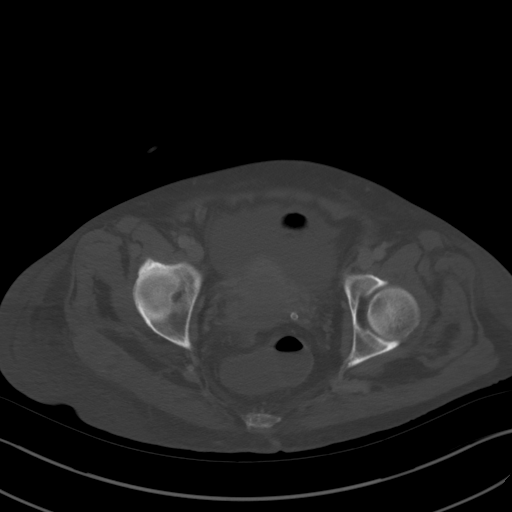
[im 40/100  soft-tissue]
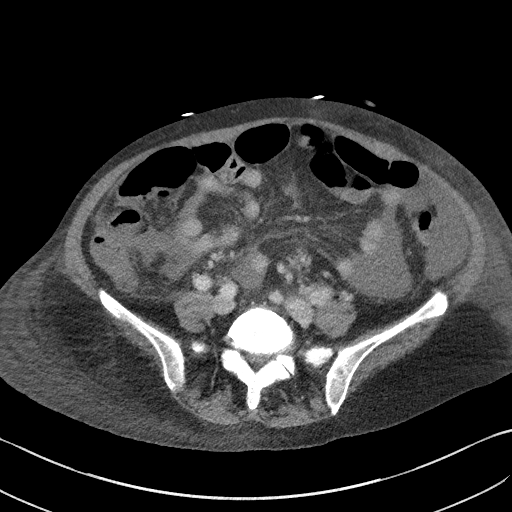
[im 40/100  lung]
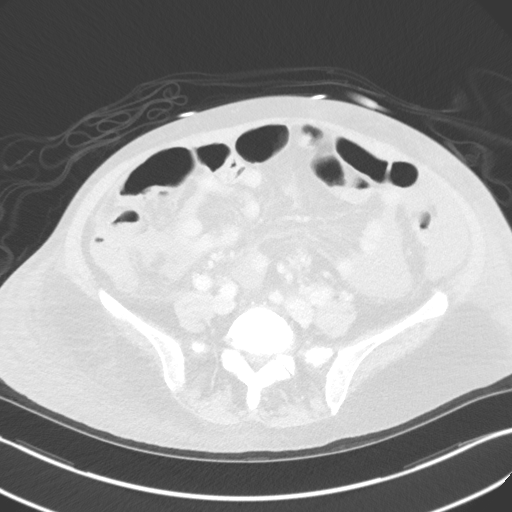
[im 60/100  soft-tissue]
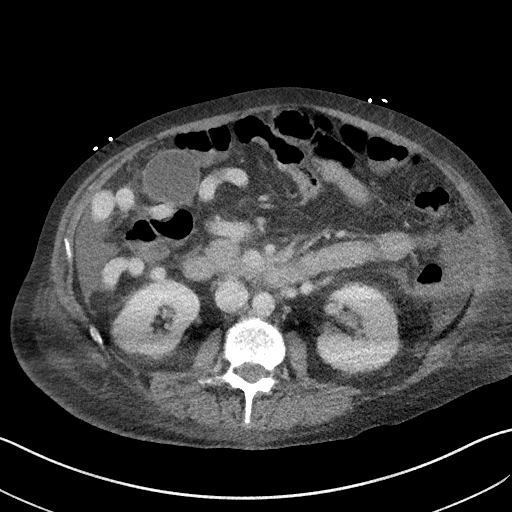
[im 60/100  lung]
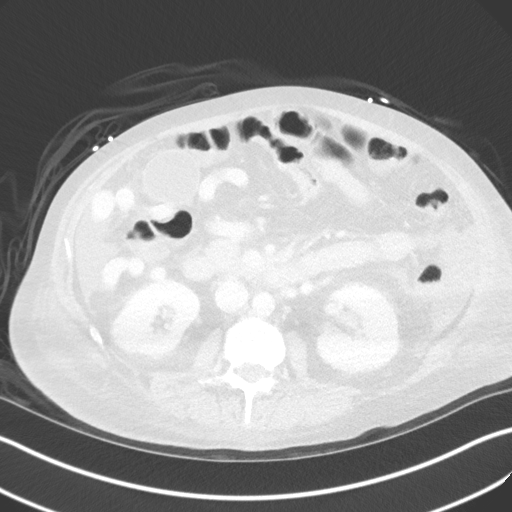
[im 80/100  soft-tissue]
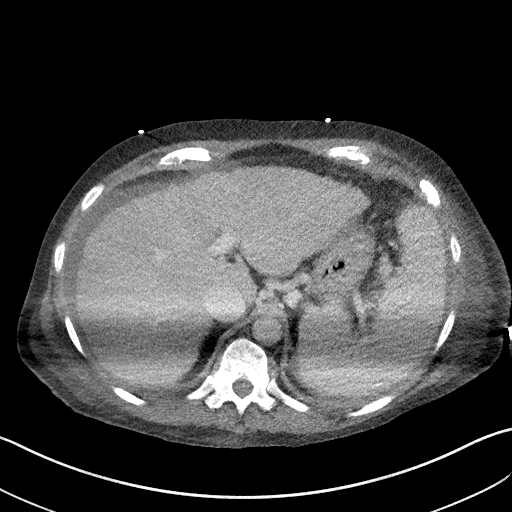
[im 80/100  lung]
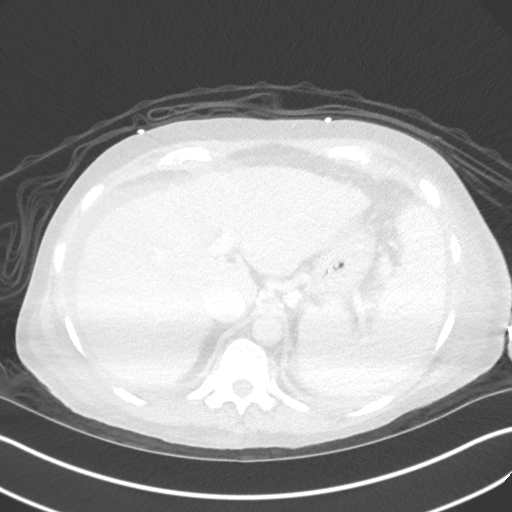

[9 of 46 positions shown; findings below may reference images not displayed]

FINDINGS: VASCULAR

Aorta: Normal in caliber. Minimal atherosclerotic vascular
calcifications.

Celiac: The celiac axis gives rise to the left gastric and splenic
artery. No stenosis, aneurysm or dissection.

SMA: Completely replaced common hepatic artery. The origin of SMA is
widely patent.

Renals: Solitary renal arteries bilaterally. The renal arteries are
widely patent without evidence of aneurysm, dissection or FMD.

IMA: Patent without evidence of aneurysm, dissection, vasculitis or
significant stenosis.

Inflow: Patent without evidence of aneurysm, dissection, vasculitis
or significant stenosis.

Proximal Outflow: Bilateral common femoral and visualized portions
of the superficial and profunda femoral arteries are patent without
evidence of aneurysm, dissection, vasculitis or significant
stenosis.

Veins: Severe portal hypertension with atypical ectopic variceal
pattern. There are small esophageal varices arising from the
posterior gastric vein. However, the dominant variceal system arises
from the superior mesenteric vein, specifically the right colic
branch which is highly tortuous and enlarged and travels throughout
the right hemiabdomen and through the wall of the ascending colon
before joining the systemic system via the right ovarian vein. The
right ovarian vein is diffusely enlarged and hypervascular. Where
the vein passes through the wall of the ascending colon, there are
massive varices. Additionally, there is a small network of varices
in the duodenum at the junction of the third and fourth portions
which appear to communicate with the ovarian vein as well. No
gastric varices visualized. Patent and unremarkable renal veins. The
IVC and iliac veins are all patent.

Review of the MIP images confirms the above findings.

NON-VASCULAR

Lower chest: Cardiomegaly. No pericardial effusion. Trace bilateral
pleural effusions with associated atelectasis.

Hepatobiliary: Diffusely nodular hepatic contour consistent with
cirrhosis. No arterially enhancing lesion is evident. No intra or
extrahepatic biliary ductal dilatation. Multiple small stones layer
dependently within the gallbladder lumen. The common bile duct is
normal.

Pancreas: Unremarkable. No pancreatic ductal dilatation or
surrounding inflammatory changes.

Spleen: Marked splenomegaly.

Adrenals/Urinary Tract: Normal adrenal glands. The kidneys are
unremarkable. No hydronephrosis. Unremarkable ureters. The bladder
is decompressed with a Foley catheter in place.

Stomach/Bowel: Please see variceal description in the venous section
above. There is no evidence of intraluminal hemorrhage at this time.
No evidence of bowel obstruction or focal bowel wall thickening.

Lymphatic: No suspicious lymphadenopathy.

Reproductive: Uterus and bilateral adnexa are unremarkable.

Other: Mild to moderate ascites. Of note, high attenuation material
is layering adjacent to the varices in the right pericolic gutter
and also more dependently in the pelvis consistent with
hemoperitoneum.

Musculoskeletal: No acute fracture or aggressive appearing lytic or
blastic osseous lesion. Focal L5-S1 degenerative disc disease.
IMPRESSION: VASCULAR

1. Massive portosystemic shunt between the right colic vein in the
right gonadal vein resulting in very large intraperitoneal and
ascending colonic varices. Additionally, there are small jejunal
varices at the junction of the third and fourth portion of the
duodenum which also drain via the right gonadal vein. There is
evidence of recent intraperitoneal bleeding with layering blood
products in the patient's ascites. These very large varices may be
at high risk for continued bleeding.
2. The portal veins remain patent.
3. Small esophageal varices consistent with grade 1 varices as
described on the recent upper endoscopy.
4.  Aortic Atherosclerosis (VZEM3-170.0).
5. The common hepatic artery is replaced to the SMA.

NON-VASCULAR

1. Hepatic cirrhosis with portal hypertension including splenomegaly
and extensive portosystemic varices as described above.
2. Mild to moderate ascites with layering blood products consistent
with recent hemorrhage.
3. Cholelithiasis.
4. Cardiomegaly.
5. Small bilateral pleural effusions and associated atelectasis.

These results were called by telephone at the time of interpretation
on 11/13/2018 at [DATE] to Dr. MARIA ISABEL SOE , who verbally
acknowledged these results.

## 2020-02-12 IMAGING — US PARACENTESIS WITH ULTRASOUND GUIDANCE
1 series · 5 of 5 positions shown · non-contrast
Comparison: none

INDICATION: 53-year-old with fever. Ascites. Patient initially presented with
low hemoglobin.

[Series 1: paracentesis with ultrasound guidance · 0.25mm/px · 5 of 5 slices shown]
[im 1/5]
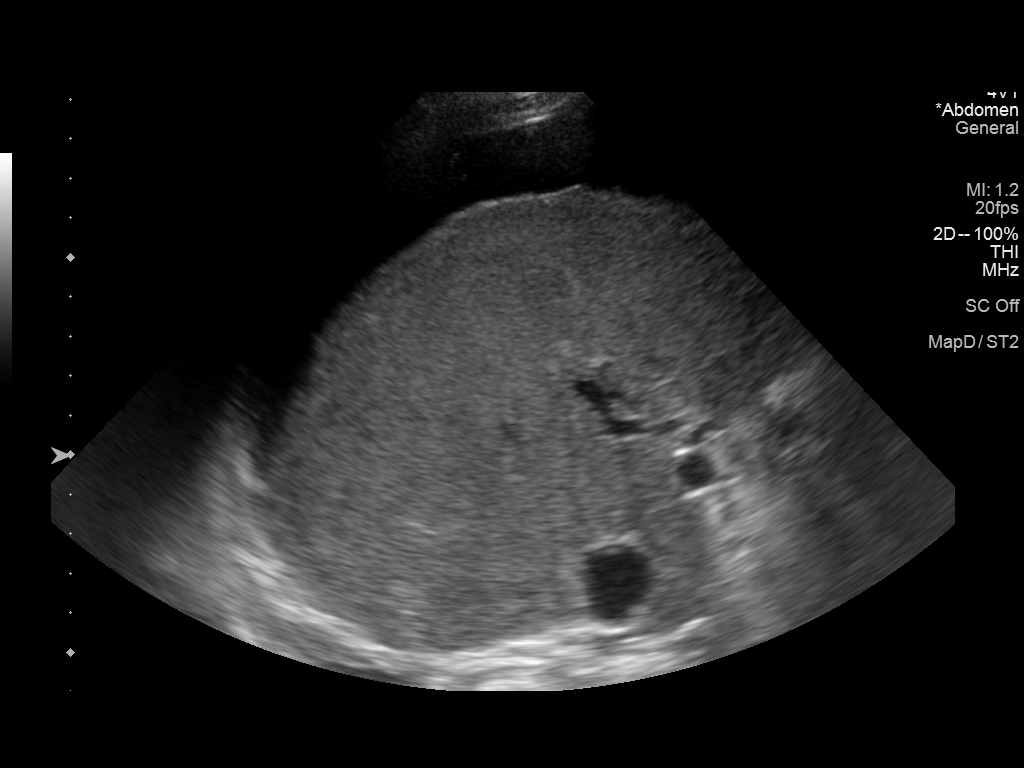
[im 2/5]
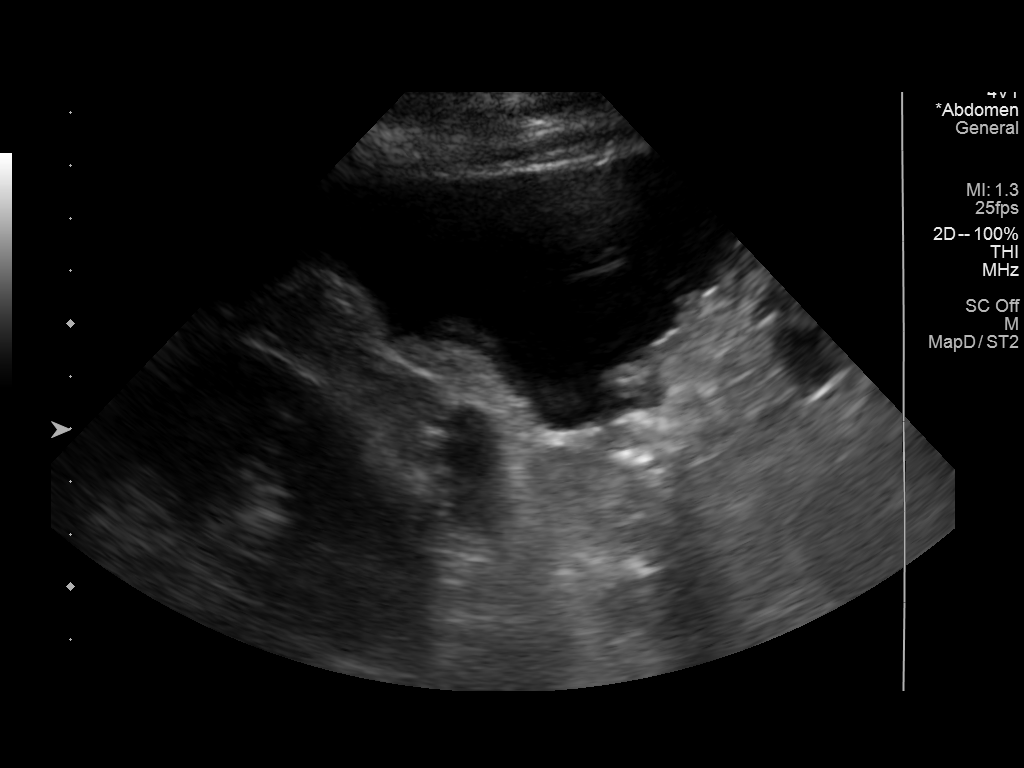
[im 3/5]
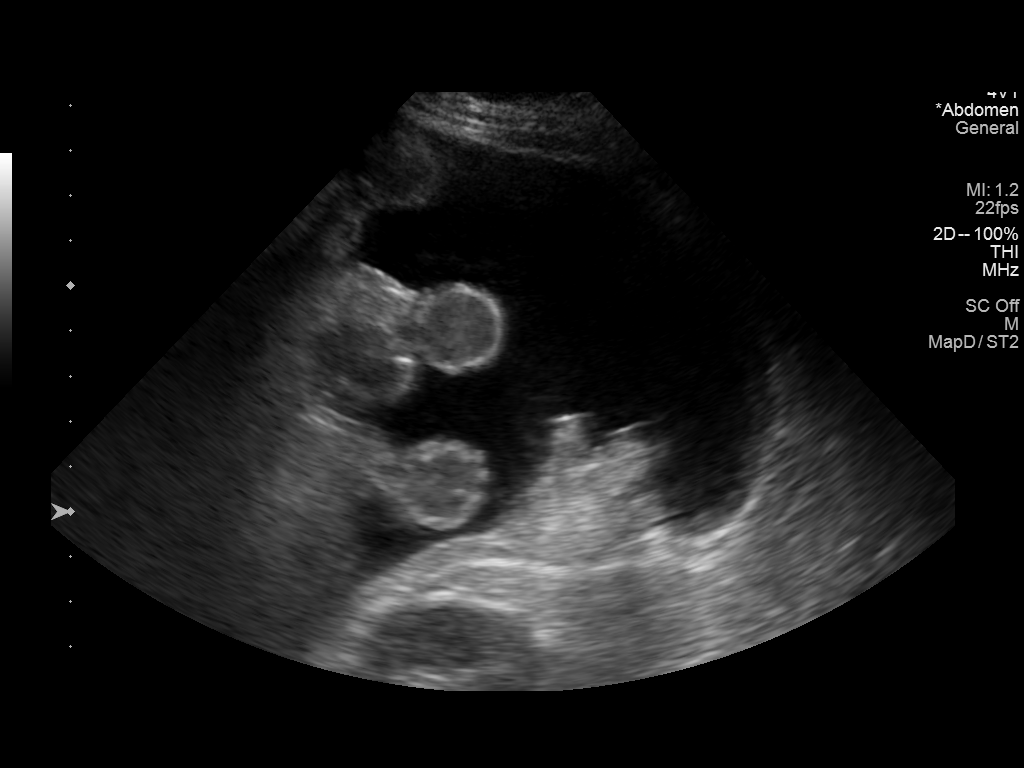
[im 4/5]
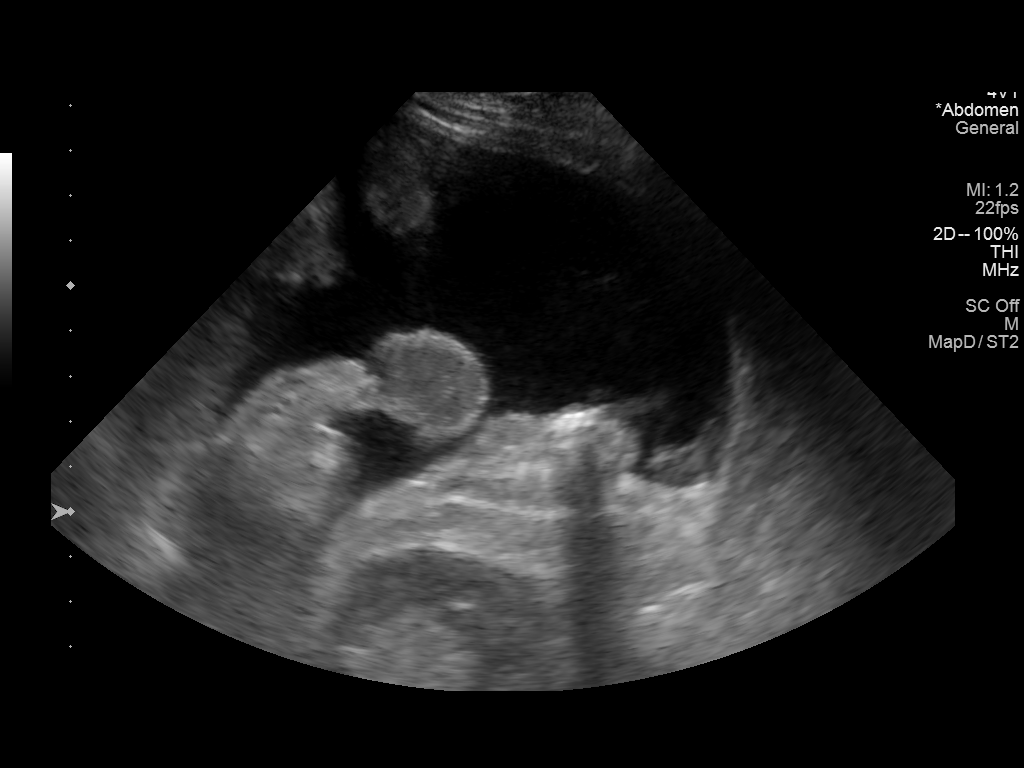
[im 5/5]
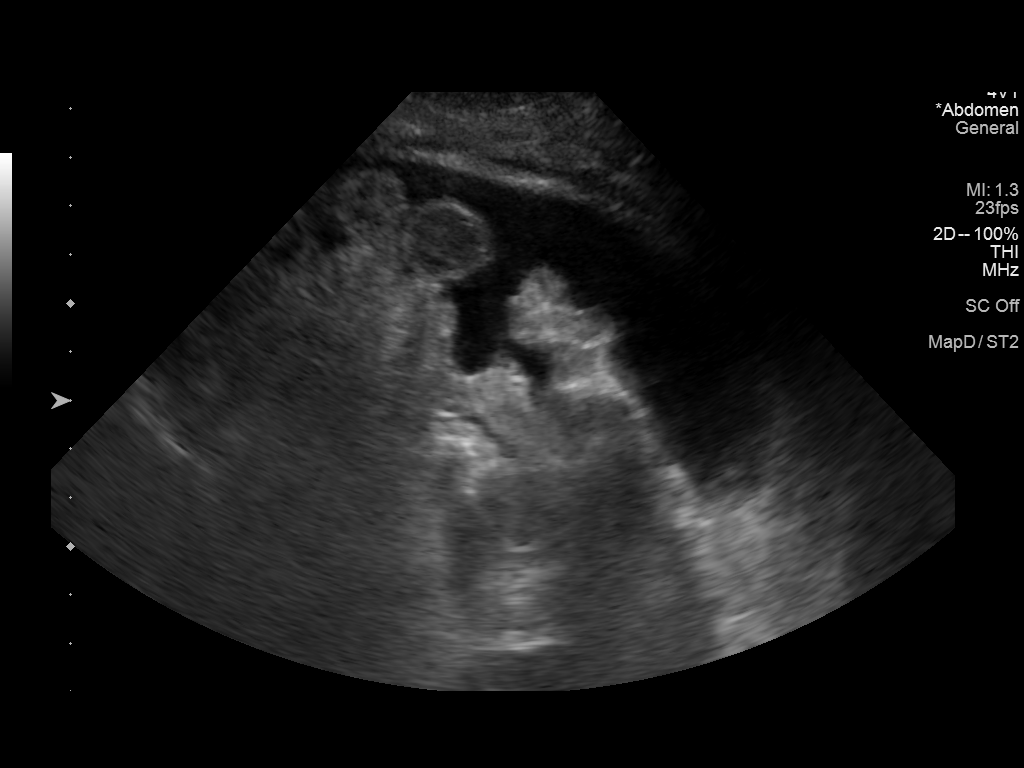

[5 of 5 positions shown; findings below may reference images not displayed]

EXAM:
ULTRASOUND GUIDED PARACENTESIS

MEDICATIONS:
None.

COMPLICATIONS:
None immediate.

PROCEDURE:
Informed written consent was obtained from the patient after a
discussion of the risks, benefits and alternatives to treatment. A
timeout was performed prior to the initiation of the procedure.

Initial ultrasound scanning demonstrates a large amount of ascites
within the left lower abdominal quadrant. The right lower abdomen
was prepped and draped in the usual sterile fashion. 1% lidocaine
was used for local anesthesia.

Following this, a 6 Fr Safe-T-Centesis catheter was introduced. An
ultrasound image was saved for documentation purposes. The
paracentesis was performed. The catheter was removed and a dressing
was applied. The patient tolerated the procedure well without
immediate post procedural complication.
FINDINGS: A total of approximately 2 L of bloody fluid was removed. Samples
were sent to the laboratory as requested by the clinical team.

Paracentesis was stopped after 2 L due to the bloody appearance of
the fluid.
IMPRESSION: Successful ultrasound-guided paracentesis yielding 2 liters of
peritoneal fluid.

These results were called by telephone at the time of interpretation
on 11/13/2018 at [DATE] to Dr. OMALICHA RHODA , who verbally
acknowledged these results.

## 2020-02-23 ENCOUNTER — Other Ambulatory Visit: Payer: Self-pay

## 2020-02-23 ENCOUNTER — Ambulatory Visit (INDEPENDENT_AMBULATORY_CARE_PROVIDER_SITE_OTHER): Payer: PRIVATE HEALTH INSURANCE | Admitting: Dermatology

## 2020-02-23 DIAGNOSIS — L814 Other melanin hyperpigmentation: Secondary | ICD-10-CM

## 2020-02-23 DIAGNOSIS — B009 Herpesviral infection, unspecified: Secondary | ICD-10-CM

## 2020-02-23 DIAGNOSIS — D229 Melanocytic nevi, unspecified: Secondary | ICD-10-CM

## 2020-02-23 DIAGNOSIS — Z1283 Encounter for screening for malignant neoplasm of skin: Secondary | ICD-10-CM

## 2020-02-23 DIAGNOSIS — S41002A Unspecified open wound of left shoulder, initial encounter: Secondary | ICD-10-CM

## 2020-02-23 DIAGNOSIS — T148XXA Other injury of unspecified body region, initial encounter: Secondary | ICD-10-CM

## 2020-02-23 DIAGNOSIS — D225 Melanocytic nevi of trunk: Secondary | ICD-10-CM

## 2020-02-23 DIAGNOSIS — L578 Other skin changes due to chronic exposure to nonionizing radiation: Secondary | ICD-10-CM

## 2020-02-23 DIAGNOSIS — D239 Other benign neoplasm of skin, unspecified: Secondary | ICD-10-CM

## 2020-02-23 DIAGNOSIS — D1800 Hemangioma unspecified site: Secondary | ICD-10-CM

## 2020-02-23 DIAGNOSIS — L821 Other seborrheic keratosis: Secondary | ICD-10-CM

## 2020-02-23 DIAGNOSIS — D235 Other benign neoplasm of skin of trunk: Secondary | ICD-10-CM

## 2020-02-23 MED ORDER — MUPIROCIN 2 % EX OINT: 1.0000 "application " | TOPICAL_OINTMENT | Freq: Every day | CUTANEOUS | 0 refills | Status: DC

## 2020-02-23 MED ORDER — VALACYCLOVIR HCL 1 G PO TABS
ORAL_TABLET | ORAL | 1 refills | Status: DC
Start: 2020-02-23 — End: 2024-02-10

## 2020-02-23 NOTE — Progress Notes (Signed)
New Patient Visit  Subjective  Lisa Dennis is a 54 y.o. female who presents for the following: FBSE (Patient's first time to a dermatologist. ).  Patient here for full body skin exam and skin cancer screening. Patient does have some styes at eyelids, a spot at right lower leg that she scratches at and it bled. There are also a few spots at cheek and chest that are scaly.  Patient has something at her scalp that looks blistered for about 1 week, she thinks it could be shingles so she took Valtrex. She has a history of shingles several times. Patient does have a history of tanning bed use.  Patient recently diagnosed with liver cancer. She is not having treatment but is waiting for transplant.   The following portions of the chart were reviewed this encounter and updated as appropriate:   Tobacco  Allergies  Meds  Problems  Med Hx  Surg Hx  Fam Hx      Review of Systems:  No other skin or systemic complaints except as noted in HPI or Assessment and Plan.  Objective  Well appearing patient in no apparent distress; mood and affect are within normal limits.  A full examination was performed including scalp, head, eyes, ears, nose, lips, neck, chest, axillae, abdomen, back, buttocks, bilateral upper extremities, bilateral lower extremities, hands, feet, fingers, toes, fingernails, and toenails. All findings within normal limits unless otherwise noted below.  Objective  Upper mid abdomen: 0.4cm dark brown thin papule with perifollicular dropout  Right Upper Abdomen: 0.75cm dark brown thin papule, symmetric  Images      Objective  Left Thigh: Healing crusted erosions   Objective  Left superior shoulder: Open wound  Objective  Left Mid Back: Firm pink/brown papule nodule with dimple sign.    Assessment & Plan  Nevus (2) Right Upper Abdomen; Upper mid abdomen  Benign-appearing.  Observation.  Call clinic for new or changing lesions.  Recommend daily use  of broad spectrum spf 30+ sunscreen to sun-exposed areas.    Herpes simplex Left Thigh  Chronic, recurrent for many years. Currently flared. No cure.  Recommend Valtrex 1g take 2 po at onset of symptoms and 2 po 12 hours later.   HSV vs Folliculitis at scalp HSV > VZV at left thigh  Ordered Medications: valACYclovir (VALTREX) 1000 MG tablet  Open wound Left superior shoulder  Start mupirocin daily.   Ordered Medications: mupirocin ointment (BACTROBAN) 2 %  Dermatofibroma Left Mid Back  Benign-appearing.  Observation.  Call clinic for new or changing lesions.  Recommend daily use of broad spectrum spf 30+ sunscreen to sun-exposed areas.     Lentigines - Scattered tan macules - Discussed due to sun exposure - Benign, observe - Call for any changes  Seborrheic Keratoses - Stuck-on, waxy, tan-brown papules and plaques  - Discussed benign etiology and prognosis. - Observe - Call for any changes  Melanocytic Nevi - Tan-brown and/or pink-flesh-colored symmetric macules and papules - Benign appearing on exam today - Observation - Call clinic for new or changing moles - Recommend daily use of broad spectrum spf 30+ sunscreen to sun-exposed areas.   Hemangiomas - Red papules - Discussed benign nature - Observe - Call for any changes  Actinic Damage - Chronic, secondary to cumulative UV/sun exposure - diffuse scaly erythematous macules with underlying dyspigmentation - Recommend daily broad spectrum sunscreen SPF 30+ to sun-exposed areas, reapply every 2 hours as needed.  - Call for new or changing lesions.  Skin cancer screening performed today.  Return in about 3 months (around 05/23/2020) for recheck nevi at abdomen.  Graciella Belton, RMA, am acting as scribe for Forest Gleason, MD .   Documentation: I have reviewed the above documentation for accuracy and completeness, and I agree with the above.  Forest Gleason, MD

## 2020-02-23 NOTE — Patient Instructions (Addendum)

## 2020-02-28 ENCOUNTER — Ambulatory Visit: Payer: PRIVATE HEALTH INSURANCE | Attending: Internal Medicine

## 2020-02-28 DIAGNOSIS — Z23 Encounter for immunization: Secondary | ICD-10-CM

## 2020-02-28 NOTE — Progress Notes (Signed)
   Covid-19 Vaccination Clinic  Name:  Cassy Sprowl    MRN: [id number] DOB: 1965/09/23  02/28/2020  Ms. Genene Kilman was observed post Covid-19 immunization for 15 minutes without incident. She was provided with Vaccine Information Sheet and instruction to access the V-Safe system.   Ms. Wendelin Reader was instructed to call 911 with any severe reactions post vaccine: Marland Kitchen Difficulty breathing  . Swelling of face and throat  . A fast heartbeat  . A bad rash all over body  . Dizziness and weakness   Immunizations Administered    Name Date Dose VIS Date Route   Pfizer COVID-19 Vaccine 02/28/2020  2:17 PM 0.3 mL 01/12/2020 Intramuscular   Manufacturer: [place]   Lot: [id number]   [place]: [address]-2

## 2020-03-10 ENCOUNTER — Telehealth: Payer: Self-pay

## 2020-03-10 NOTE — Telephone Encounter (Signed)
Volunteer called patient on behalf of [place] [facility] and patient is doing okay. She did report a new diagnosis of liver cancer.

## 2020-03-22 ENCOUNTER — Encounter: Payer: Self-pay | Admitting: Dermatology

## 2020-05-16 ENCOUNTER — Telehealth: Payer: Self-pay

## 2020-05-16 NOTE — Telephone Encounter (Signed)
Volunteer called patient on behalf of Palliative Care. Patient is doing well at this time.  

## 2020-06-07 ENCOUNTER — Ambulatory Visit (INDEPENDENT_AMBULATORY_CARE_PROVIDER_SITE_OTHER): Payer: BC Managed Care – PPO | Admitting: Dermatology

## 2020-06-07 ENCOUNTER — Other Ambulatory Visit: Payer: Self-pay

## 2020-06-07 DIAGNOSIS — D239 Other benign neoplasm of skin, unspecified: Secondary | ICD-10-CM

## 2020-06-07 DIAGNOSIS — D229 Melanocytic nevi, unspecified: Secondary | ICD-10-CM

## 2020-06-07 DIAGNOSIS — L578 Other skin changes due to chronic exposure to nonionizing radiation: Secondary | ICD-10-CM | POA: Diagnosis not present

## 2020-06-07 DIAGNOSIS — L988 Other specified disorders of the skin and subcutaneous tissue: Secondary | ICD-10-CM

## 2020-06-07 DIAGNOSIS — D492 Neoplasm of unspecified behavior of bone, soft tissue, and skin: Secondary | ICD-10-CM

## 2020-06-07 DIAGNOSIS — D225 Melanocytic nevi of trunk: Secondary | ICD-10-CM | POA: Diagnosis not present

## 2020-06-07 HISTORY — DX: Other benign neoplasm of skin, unspecified: D23.9

## 2020-06-07 NOTE — Patient Instructions (Signed)
Wound Care Instructions  1. Cleanse wound gently with soap and water once a day then pat dry with clean gauze. Apply a thing coat of Petrolatum (petroleum jelly, "Vaseline") over the wound (unless you have an allergy to this). We recommend that you use a new, sterile tube of Vaseline. Do not pick or remove scabs. Do not remove the yellow or white "healing tissue" from the base of the wound.  2. Cover the wound with fresh, clean, nonstick gauze and secure with paper tape. You may use Band-Aids in place of gauze and tape if the would is small enough, but would recommend trimming much of the tape off as there is often too much. Sometimes Band-Aids can irritate the skin.  3. You should call the office for your biopsy report after 1 week if you have not already been contacted.  4. If you experience any problems, such as abnormal amounts of bleeding, swelling, significant bruising, significant pain, or evidence of infection, please call the office immediately.  5. FOR ADULT SURGERY PATIENTS: If you need something for pain relief you may take 1 extra strength Tylenol (acetaminophen) AND 2 Ibuprofen (200mg  each) together every 4 hours as needed for pain. (do not take these if you are allergic to them or if you have a reason you should not take them.) Typically, you may only need pain medication for 1 to 3 days.   Instructions for [facility] Medications  One or more of your medications was sent to the [facility] mail order compounding pharmacy. You will receive an email from them and can purchase the medicine through that link. It will then be mailed to your home at the address you confirmed. If for any reason you do not receive an email from them, please check your spam folder. If you still do not find the email, please let us know. [facility] phone number is [phone].

## 2020-06-07 NOTE — Progress Notes (Signed)
Follow-Up Visit   Subjective  Lisa Dennis is a 55 y.o. female who presents for the following: Nevus (3 months f/u moles on the abdomen ) and Skin Problem (Pt concerned about fine line on her face, she would like a treatment plan). Pt report no changes in moles she is aware of.    The following portions of the chart were reviewed this encounter and updated as appropriate:   Tobacco  Allergies  Meds  Problems  Med Hx  Surg Hx  Fam Hx      Review of Systems:  No other skin or systemic complaints except as noted in HPI or Assessment and Plan.  Objective  Well appearing patient in no apparent distress; mood and affect are within normal limits.  A focused examination was performed including face, abdomen . Relevant physical exam findings are noted in the Assessment and Plan.  Objective  Head - Anterior (Face): Rhytides and volume loss.   Objective  right upper abdomen: 0.75cm dark brown thin papule, symmetric  Objective  upper mid abdomen: 0.4 cm irregular thin brown papule         Assessment & Plan  Elastosis of skin Head - Anterior (Face)  Will prescribe [employer]inals Anti-Aging Tretinoin 0.025%/Niacinamide/Vitamin C/Resveratrol with Hyaluronic Acid. Apply pea sized amount nightly to the entire face.  The patient was advised this is not covered by insurance since it is made by a compounding pharmacy. They will receive an email to check out and the medication will be mailed to their home.   Topical retinoid medications like tretinoin can cause dryness and irritation when first started. Only apply a pea-sized amount to the entire affected area. Avoid applying it around the eyes, edges of mouth and creases at the nose. If you experience irritation, use a good moisturizer first and/or apply the medicine less often. If you are doing well with the medicine, you can increase how often you use it until you are applying every night. Be careful with sun protection  while using this medication as it can make you sensitive to the sun. This medicine should not be used by pregnant women.    Nevus right upper abdomen  Benign-appearing.  Observation.  Call clinic for new or changing moles.  Recommend daily use of broad spectrum spf 30+ sunscreen to sun-exposed areas.    Neoplasm of skin upper mid abdomen  Epidermal / dermal shaving  Lesion diameter (cm):  0.4 Informed consent: discussed and consent obtained   Timeout: patient name, date of birth, surgical site, and procedure verified   Procedure prep:  Patient was prepped and draped in usual sterile fashion Prep type:  Isopropyl alcohol Anesthesia: the lesion was anesthetized in a standard fashion   Anesthetic:  1% lidocaine w/ epinephrine 1-100,000 buffered w/ 8.4% NaHCO3 Hemostasis achieved with: pressure, aluminum chloride and electrodesiccation   Outcome: patient tolerated procedure well   Post-procedure details: sterile dressing applied and wound care instructions given   Dressing type: bandage and petrolatum    Specimen 1 - Surgical pathology Differential Diagnosis: R/O Atypical nevus vs other   Check Margins: No 0.4 cm irregular thin brown papule  Actinic Damage - chronic, secondary to cumulative UV radiation exposure/sun exposure over time - diffuse scaly erythematous macules with underlying dyspigmentation - Recommend daily broad spectrum sunscreen SPF 30+ to sun-exposed areas, reapply every 2 hours as needed.  - Recommend staying in the shade or wearing long sleeves, sun glasses (UVA+UVB protection) and wide brim hats (  4-inch brim around the entire circumference of the hat). - Call for new or changing lesions.  Return in about 1 year (around 06/07/2021) for TBSE .   I, Marye Round, CMA, am acting as scribe for Forest Gleason, MD .  Documentation: I have reviewed the above documentation for accuracy and completeness, and I agree with the above.  Forest Gleason, MD

## 2020-06-13 ENCOUNTER — Telehealth: Payer: Self-pay

## 2020-06-13 NOTE — Telephone Encounter (Signed)
Patient advised of biopsy and scheduled for surgery.

## 2020-06-13 NOTE — Telephone Encounter (Signed)
-----   Message from Alfonso Patten, MD sent at 06/13/2020  1:54 PM EDT ----- Skin , upper mid abdomen JUNCTIONAL DYSPLASTIC MELANOCYTIC NEVUS WITH MODERATE TO SEVERE ATYPIA, CLOSE TO MARGIN, SEE DESCRIPTION  This is a SEVERELY ATYPICAL MOLE. On the spectrum from normal mole to melanoma skin cancer, this is in between the two but closer towards a melanoma skin cancer.  - The treatment of choice for severely atypical moles is to cut them out in clinic with an area of normal looking skin around them to get all the atypical cells out. The skin that is removed will be sent to check under the microscope again to be sure it looks completely out.   - People who have a history of atypical moles do have a slightly increased risk of developing melanoma somewhere on the body, so a full body skin exam by a dermatologist is recommended at least once a year. - Monthly self skin checks and daily sun protection are also recommended.  - Please also call if you notice any new or changing spots anywhere else on the body before your follow-up visit.    Dr. Jerilynn Mages called 06/13/20 at 1:50 PM. No answer, left voicemail.  MAs ok to go over results and schedule for excision. Please call starting 06/14/20. Thank you!

## 2020-06-18 ENCOUNTER — Encounter: Payer: Self-pay | Admitting: Dermatology

## 2020-06-27 ENCOUNTER — Other Ambulatory Visit: Payer: Self-pay

## 2020-06-27 ENCOUNTER — Ambulatory Visit (INDEPENDENT_AMBULATORY_CARE_PROVIDER_SITE_OTHER): Payer: BC Managed Care – PPO | Admitting: Dermatology

## 2020-06-27 DIAGNOSIS — D485 Neoplasm of uncertain behavior of skin: Secondary | ICD-10-CM

## 2020-06-27 NOTE — Patient Instructions (Addendum)

## 2020-06-27 NOTE — Progress Notes (Signed)
Note opened in error.  Appointment rescheduled.

## 2020-07-11 ENCOUNTER — Other Ambulatory Visit: Payer: Self-pay

## 2020-07-11 ENCOUNTER — Ambulatory Visit (INDEPENDENT_AMBULATORY_CARE_PROVIDER_SITE_OTHER): Payer: BC Managed Care – PPO | Admitting: Dermatology

## 2020-07-11 DIAGNOSIS — D225 Melanocytic nevi of trunk: Secondary | ICD-10-CM | POA: Diagnosis not present

## 2020-07-11 DIAGNOSIS — D485 Neoplasm of uncertain behavior of skin: Secondary | ICD-10-CM

## 2020-07-11 MED ORDER — MUPIROCIN 2 % EX OINT: 1.0000 "application " | TOPICAL_OINTMENT | Freq: Every day | CUTANEOUS | 0 refills | Status: DC

## 2020-07-11 MED ORDER — OXYCODONE HCL 5 MG PO TABS: 5.0000 mg | ORAL_TABLET | Freq: Four times a day (QID) | ORAL | 0 refills | Status: AC | PRN

## 2020-07-11 NOTE — Patient Instructions (Signed)

## 2020-07-11 NOTE — Progress Notes (Signed)
   Follow-Up Visit   Subjective  Lisa Dennis is a 55 y.o. 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   The following portions of the chart were reviewed this encounter and updated as appropriate:   Tobacco  Allergies  Meds  Problems  Med Hx  Surg Hx  Fam Hx      Review of Systems:  No other skin or systemic complaints except as noted in HPI or Assessment and Plan.  Objective  Well appearing patient in no apparent distress; mood and affect are within normal limits.  A focused examination was performed including abdomen. Relevant physical exam findings are noted in the Assessment and Plan.  Objective  Upper Mid Abdomen: Healing bx site   Assessment & Plan  Neoplasm of uncertain behavior of skin Upper Mid Abdomen  Skin excision  Lesion length (cm):  0.7 Total excision diameter (cm):  1.7 Informed consent: discussed and consent obtained   Timeout: patient name, date of birth, surgical site, and procedure verified   Procedure prep:  Patient was prepped and draped in usual sterile fashion Prep type:  Chlorhexidine Anesthesia: the lesion was anesthetized in a standard fashion   Anesthetic:  1% lidocaine w/ epinephrine 1-100,000 buffered w/ 8.4% NaHCO3 Instrument used comment:  15c Hemostasis achieved with: suture, pressure and electrodesiccation   Outcome: patient tolerated procedure well with no complications   Post-procedure details: wound care instructions given   Additional details:  Mupirocin and a pressure dressing applied  Skin repair Complexity:  Complex Final length (cm):  3.8 Informed consent: discussed and consent obtained   Timeout: patient name, date of birth, surgical site, and procedure verified   Procedure prep:  Patient was prepped and draped in usual sterile fashion Prep type:  Chlorhexidine Anesthesia: the lesion  was anesthetized in a standard fashion   Anesthetic:  1% lidocaine w/ epinephrine 1-100,000 local infiltration Reason for type of repair: reduce tension to allow closure, reduce the risk of dehiscence, infection, and necrosis, reduce subcutaneous dead space and avoid a hematoma, allow closure of the large defect, allow side-to-side closure without requiring a flap or graft and enhance both functionality and cosmetic results   Undermining: area extensively undermined   Subcutaneous layers (deep stitches):  Suture size:  3-0 Suture type: Vicryl (polyglactin 910)   Stitches:  Buried vertical mattress Fine/surface layer approximation (top stitches):  Suture size:  4-0 Suture type: Prolene (polypropylene)   Suture removal (days):  7 Hemostasis achieved with: pressure and electrodesiccation Outcome: patient tolerated procedure well with no complications   Post-procedure details: wound care instructions given   Additional details:  Extensive undermining greater than the maximum width of the defect along at least one entire edge of the defect was performed Maximum width of defect perpendicular to the line of the closure 1.5 Width of undermining done 1.8  Mupirocin and a pressure bandage applied   Other Related Procedures Anatomic Pathology Report  Ordered Medications: mupirocin ointment (BACTROBAN) 2 %  Return in about 1 week (around 07/18/2020) for Suture Removal.  Graciella Belton, RMA, am acting as scribe for Forest Gleason, MD .   Documentation: I have reviewed the above documentation for accuracy and completeness, and I agree with the above.  Forest Gleason, MD

## 2020-07-12 ENCOUNTER — Telehealth: Payer: Self-pay

## 2020-07-12 ENCOUNTER — Encounter: Payer: Self-pay | Admitting: Dermatology

## 2020-07-12 NOTE — Telephone Encounter (Signed)
Patient doing well after yesterday's surgery. She did have a lot of pain last night but it has resolved, JS

## 2020-07-17 ENCOUNTER — Encounter: Payer: Self-pay | Admitting: Dermatology

## 2020-07-18 ENCOUNTER — Other Ambulatory Visit: Payer: Self-pay

## 2020-07-18 ENCOUNTER — Encounter: Payer: Self-pay | Admitting: Dermatology

## 2020-07-18 ENCOUNTER — Ambulatory Visit (INDEPENDENT_AMBULATORY_CARE_PROVIDER_SITE_OTHER): Payer: BC Managed Care – PPO | Admitting: Dermatology

## 2020-07-18 DIAGNOSIS — Z4802 Encounter for removal of sutures: Secondary | ICD-10-CM

## 2020-07-18 DIAGNOSIS — D239 Other benign neoplasm of skin, unspecified: Secondary | ICD-10-CM

## 2020-07-18 DIAGNOSIS — D235 Other benign neoplasm of skin of trunk: Secondary | ICD-10-CM

## 2020-07-18 LAB — ANATOMIC PATHOLOGY REPORT

## 2020-07-18 NOTE — Patient Instructions (Signed)
After Suture Removal  If your medical team has placed Steri-Strips (white adhesive strips covering the surgical site to provide extra support): Keep the area dry until they fall off.  Do not peel them off. Just let them fall off on their own.  If the edges peel up, you can trim them with scissors.   If your team has not placed Steri-Strips: Wash the area daily with soap and water. Then coat the incision site with plain Vaseline and cover with a bandage. Do this daily for 5 days after the sutures are removed. After that, no additional wound care is generally needed.  However, if you would like to help fade the scar, you can apply a silicone scar cream, gel or sheet every night. The scar will remodel for one year after the procedure. If a skin cancer was removed, be sure to keep your appointment with your dermatologist for follow-up and let your dermatology team know if you have any new or changing spots between visits.    Please call our office at [phone] for any questions or concerns. 

## 2020-07-18 NOTE — Progress Notes (Signed)
   Follow-Up Visit   Subjective  Lisa Dennis is a 55 y.o. female who presents for the following: Suture / Staple Removal (1 week suture removal post excision of severe dysplastic nevus. Surgical biopsy pending. ).  The following portions of the chart were reviewed this encounter and updated as appropriate:  Tobacco  Allergies  Meds  Problems  Med Hx  Surg Hx  Fam Hx      Review of Systems: No other skin or systemic complaints except as noted in HPI or Assessment and Plan.   Objective  Well appearing patient in no apparent distress; mood and affect are within normal limits.  A focused examination was performed including abdomen. Relevant physical exam findings are noted in the Assessment and Plan.  Objective  mid upper abdomen: 7 day post excision suture removal  Incision site is clean, dry and intact  Assessment & Plan  Dysplastic nevus mid upper abdomen  Encounter for Removal of Sutures - Incision site at the mid upper abdomen is clean, dry and intact - Wound cleansed, sutures removed, wound cleansed and steri strips applied.  - Discussed pathology results showing results pending  - Patient advised to keep steri-strips dry until they fall off. - Scars remodel for a full year. - Once steri-strips fall off, patient can apply over-the-counter silicone scar cream each night to help with scar remodeling if desired. - Patient advised to call with any concerns or if they notice any new or changing lesions.   Return in about 5 months (around 12/18/2020) for TBSE.   I, Harriett Sine, CMA, am acting as scribe for Forest Gleason, MD.  Documentation: I have reviewed the above documentation for accuracy and completeness, and I agree with the above.  Forest Gleason, MD

## 2020-07-24 ENCOUNTER — Encounter: Payer: Self-pay | Admitting: Dermatology

## 2020-08-08 ENCOUNTER — Telehealth: Payer: Self-pay

## 2020-08-08 NOTE — Telephone Encounter (Signed)
Volunteer called patient on behalf of [facility]. Patient is doing well at this time.  

## 2020-12-07 ENCOUNTER — Ambulatory Visit (INDEPENDENT_AMBULATORY_CARE_PROVIDER_SITE_OTHER): Payer: BC Managed Care – PPO | Admitting: Dermatology

## 2020-12-07 ENCOUNTER — Other Ambulatory Visit: Payer: Self-pay

## 2020-12-07 DIAGNOSIS — D1800 Hemangioma unspecified site: Secondary | ICD-10-CM

## 2020-12-07 DIAGNOSIS — D692 Other nonthrombocytopenic purpura: Secondary | ICD-10-CM

## 2020-12-07 DIAGNOSIS — D225 Melanocytic nevi of trunk: Secondary | ICD-10-CM

## 2020-12-07 DIAGNOSIS — L814 Other melanin hyperpigmentation: Secondary | ICD-10-CM

## 2020-12-07 DIAGNOSIS — Z1283 Encounter for screening for malignant neoplasm of skin: Secondary | ICD-10-CM

## 2020-12-07 DIAGNOSIS — L988 Other specified disorders of the skin and subcutaneous tissue: Secondary | ICD-10-CM

## 2020-12-07 DIAGNOSIS — Z86018 Personal history of other benign neoplasm: Secondary | ICD-10-CM

## 2020-12-07 DIAGNOSIS — L821 Other seborrheic keratosis: Secondary | ICD-10-CM

## 2020-12-07 DIAGNOSIS — L578 Other skin changes due to chronic exposure to nonionizing radiation: Secondary | ICD-10-CM

## 2020-12-07 DIAGNOSIS — D229 Melanocytic nevi, unspecified: Secondary | ICD-10-CM

## 2020-12-07 NOTE — Progress Notes (Signed)
Follow-Up Visit   Subjective  Lisa Dennis is a 55 y.o. female who presents for the following: Annual Exam (Hx dysplastic nevus - S/P liver transplant 8 weeks ago).  The following portions of the chart were reviewed this encounter and updated as appropriate:   Tobacco  Allergies  Meds  Problems  Med Hx  Surg Hx  Fam Hx      Review of Systems:  No other skin or systemic complaints except as noted in HPI or Assessment and Plan.  Objective  Well appearing patient in no apparent distress; mood and affect are within normal limits.  A full examination was performed including scalp, head, eyes, ears, nose, lips, neck, chest, axillae, abdomen, back, buttocks, bilateral upper extremities, bilateral lower extremities, hands, feet, fingers, toes, fingernails, and toenails. All findings within normal limits unless otherwise noted below.  R upper abdomen 0.75 cm dark brown thin papule symmetric.  R lat abdomen 0.4 cm dark brown thin papule slightly darker centrally.  face, submandibular Rhytides and volume loss.    Assessment & Plan  Nevus (2) R lat abdomen; R upper abdomen  Benign-appearing.  Observation.  Call clinic for new or changing moles.  Recommend daily use of broad spectrum spf 30+ sunscreen to sun-exposed areas.   Elastosis of skin face, submandibular  Recommend Dr. Joslyn Devon Cox consultation to discuss treatment options for submandibular fat pad  Discussed lip lines  - avoiding sipping through straws, Botox injections, and fillers. Patient to confirm with her transplant team to make sure safe to use.   Recommend Botox 5 units to the forehead, 4 units to the upper lip, and 2 units to the lower lip.  Lentigines - Scattered tan macules - Due to sun exposure - Benign-appearing, observe - Recommend daily broad spectrum sunscreen SPF 30+ to sun-exposed areas, reapply every 2 hours as needed. - Call for any changes  Seborrheic Keratoses - Stuck-on, waxy,  tan-brown papules and/or plaques  - Benign-appearing - Discussed benign etiology and prognosis. - Observe - Call for any changes  Melanocytic Nevi - Tan-brown and/or pink-flesh-colored symmetric macules and papules - Benign appearing on exam today - Observation - Call clinic for new or changing moles - Recommend daily use of broad spectrum spf 30+ sunscreen to sun-exposed areas.   Hemangiomas - Red papules - Discussed benign nature - Observe - Call for any changes  Actinic Damage - Chronic condition, secondary to cumulative UV/sun exposure - diffuse scaly erythematous macules with underlying dyspigmentation - Recommend daily broad spectrum sunscreen SPF 30+ to sun-exposed areas, reapply every 2 hours as needed.  - Staying in the shade or wearing long sleeves, sun glasses (UVA+UVB protection) and wide brim hats (4-inch brim around the entire circumference of the hat) are also recommended for sun protection.  - Call for new or changing lesions. - Reviewed increased risk of SCC and other skin cancer after transplant  History of Dysplastic Nevi - upper mid abdomen  - No evidence of recurrence today - Recommend regular full body skin exams - Recommend daily broad spectrum sunscreen SPF 30+ to sun-exposed areas, reapply every 2 hours as needed.  - Call if any new or changing lesions are noted between office visits  Skin cancer screening performed today.  Return in about 6 months (around 06/06/2021) for TBSE  hx dysplastic nevus; Botox injections.  Luther Redo, CMA, am acting as scribe for Forest Gleason, MD .  Documentation: I have reviewed the above documentation for accuracy and  completeness, and I agree with the above.  Forest Gleason, MD

## 2020-12-07 NOTE — Patient Instructions (Addendum)
If you have any questions or concerns for your doctor, please call our main line at [phone] and press option 4 to reach your doctor's medical assistant. If no one answers, please leave a voicemail as directed and we will return your call as soon as possible. Messages left after 4 pm will be answered the following business day.   You may also send [place] a message via [place]. We typically respond to MyChart messages within 1-2 business days.  For prescription refills, please ask your pharmacy to contact our office. Our fax number is [phone].  If you have an urgent issue when the clinic is closed that cannot wait until the next business day, you can page your doctor at the number below.    Please note that while we do our best to be available for urgent issues outside of office hours, we are not available 24/7.   If you have an urgent issue and are unable to reach [place], you may choose to seek medical care at your doctor's office, retail clinic, urgent care center, or emergency room.  If you have a medical emergency, please immediately call 911 or go to the emergency department.  Pager Numbers  - Dr. Nehemiah Massed: [phone]  - Dr. Laurence Ferrari: [phone]  - Dr. Nicole Kindred: [phone]  In the event of inclement weather, please call our main line at [phone] for an update on the status of any delays or closures.  Dermatology Medication Tips: Please keep the boxes that topical medications come in in order to help keep track of the instructions about where and how to use these. Pharmacies typically print the medication instructions only on the boxes and not directly on the medication tubes.   If your medication is too expensive, please contact our office at [phone] option 4 or send [place] a message through [place].   We are unable to tell what your co-pay for medications will be in advance as this is different depending on your insurance coverage. However, we may be able to find a substitute  medication at lower cost or fill out paperwork to get insurance to cover a needed medication.   If a prior authorization is required to get your medication covered by your insurance company, please allow [place] 1-2 business days to complete this process.  Drug prices often vary depending on where the prescription is filled and some pharmacies may offer cheaper prices.  The website www.goodrx.com contains coupons for medications through different pharmacies. The prices here do not account for what the cost may be with help from insurance (it may be cheaper with your insurance), but the website can give you the price if you did not use any insurance.  - You can print the associated coupon and take it with your prescription to the pharmacy.  - You may also stop by our office during regular business hours and pick up a GoodRx coupon card.  - If you need your prescription sent electronically to a different pharmacy, notify our office through [facility] or by phone at [phone] option 4.  Dr. Joslyn Devon Cox  [address] [place], [place] [address] Phone: [phone]  Email: [email or web address]

## 2020-12-18 ENCOUNTER — Encounter: Payer: Self-pay | Admitting: Dermatology

## 2020-12-21 ENCOUNTER — Encounter: Payer: Self-pay | Admitting: Dermatology

## 2020-12-21 ENCOUNTER — Other Ambulatory Visit: Payer: Self-pay

## 2020-12-21 ENCOUNTER — Ambulatory Visit (INDEPENDENT_AMBULATORY_CARE_PROVIDER_SITE_OTHER): Payer: Self-pay | Admitting: Dermatology

## 2020-12-21 DIAGNOSIS — L988 Other specified disorders of the skin and subcutaneous tissue: Secondary | ICD-10-CM

## 2020-12-21 NOTE — Progress Notes (Signed)
   Follow-Up Visit   Subjective  Lisa Dennis is a 55 y.o. female who presents for the following: Facial Elastosis (Patient is here today for Botox injections as discussed on her last visit.).   The following portions of the chart were reviewed this encounter and updated as appropriate:   Tobacco  Allergies  Meds  Problems  Med Hx  Surg Hx  Fam Hx      Review of Systems:  No other skin or systemic complaints except as noted in HPI or Assessment and Plan.  Objective  Well appearing patient in no apparent distress; mood and affect are within normal limits.  A focused examination was performed including the face. Relevant physical exam findings are noted in the Assessment and Plan.  The face Rhytides and volume loss.                   Assessment & Plan  Elastosis of skin The face  Per patient transplant team states no contraindications to Botox injections and fine to have done at this time  Botox 11 units injected as marked: - Forehead 5 units M pattern 1 - 1 - 1 - 1 - 1 - Upper lip 4 units 1 - 1 - 1 - 1 - Lower lip 2 units 0.5 - 0.5 - 0.5 - 0.5   Botox Injection - The face Location: See attached image  Informed consent: Discussed risks (infection, pain, bleeding, bruising, swelling, allergic reaction, paralysis of nearby muscles, eyelid droop, double vision, neck weakness, difficulty breathing, headache, undesirable cosmetic result, and need for additional treatment) and benefits of the procedure, as well as the alternatives.  Informed consent was obtained.  Preparation: The area was cleansed with alcohol.  Procedure Details:  Botox was injected into the dermis with a 30-gauge needle. Pressure applied to any bleeding. Ice packs offered for swelling.  Lot Number:  [id number] Expiration:  06/2022  Total Units Injected:  11  Plan: Patient was instructed to remain upright for 4 hours. Patient was instructed to avoid massaging the face and avoid  vigorous exercise for the rest of the day. Tylenol may be used for headache.  Allow 2 weeks before returning to clinic for additional dosing as needed. Patient will call for any problems.   Return in about 2 weeks (around 01/04/2021) for Botox follow up .  Luther Redo, CMA, am acting as scribe for Forest Gleason, MD .  Documentation: I have reviewed the above documentation for accuracy and completeness, and I agree with the above.  Forest Gleason, MD

## 2020-12-21 NOTE — Patient Instructions (Signed)

## 2021-01-04 ENCOUNTER — Other Ambulatory Visit: Payer: Self-pay

## 2021-01-04 ENCOUNTER — Ambulatory Visit (INDEPENDENT_AMBULATORY_CARE_PROVIDER_SITE_OTHER): Payer: BC Managed Care – PPO | Admitting: Dermatology

## 2021-01-04 DIAGNOSIS — L988 Other specified disorders of the skin and subcutaneous tissue: Secondary | ICD-10-CM

## 2021-01-04 NOTE — Patient Instructions (Signed)

## 2021-01-04 NOTE — Progress Notes (Signed)
   Follow-Up Visit   Subjective  Lisa Dennis is a 55 y.o. female who presents for the following: Botox follow up (Patient is here today to re-evaluated previous Botox injections to see if the current dose works well for her, or if she needs more. ).  The following portions of the chart were reviewed this encounter and updated as appropriate:   Tobacco  Allergies  Meds  Problems  Med Hx  Surg Hx  Fam Hx      Review of Systems:  No other skin or systemic complaints except as noted in HPI or Assessment and Plan.  Objective  Well appearing patient in no apparent distress; mood and affect are within normal limits.  A focused examination was performed including the face. Relevant physical exam findings are noted in the Assessment and Plan.  Face Rhytides and volume loss.                     Assessment & Plan  Elastosis of skin Face  Botox 3.5 units injected as marked - Forehead increased by 1.5 units  - Upper lip increased by 2 units  Patient to check with her transplant team to make sure OK to restart [employer]inals Anti-Aging Tretinoin 0.025%/Niacinamide/Vitamin C/Resveratrol with Hyaluronic Acid QHS.  Botox Injection - Face Location: See attached image  Informed consent: Discussed risks (infection, pain, bleeding, bruising, swelling, allergic reaction, paralysis of nearby muscles, eyelid droop, double vision, neck weakness, difficulty breathing, headache, undesirable cosmetic result, and need for additional treatment) and benefits of the procedure, as well as the alternatives.  Informed consent was obtained.  Preparation: The area was cleansed with alcohol.  Procedure Details:  Botox was injected into the dermis with a 30-gauge needle. Pressure applied to any bleeding. Ice packs offered for swelling.  Lot Number:  [id number] Expiration:  09/2022  Total Units Injected:  3.5 units  Plan: Patient was instructed to remain upright for 4 hours.  Patient was instructed to avoid massaging the face and avoid vigorous exercise for the rest of the day. Tylenol may be used for headache.  Allow 2 weeks before returning to clinic for additional dosing as needed. Patient will call for any problems.   Return in about 3 months (around 04/06/2021) for Botox injections.  Luther Redo, CMA, am acting as scribe for Forest Gleason, MD .  Documentation: I have reviewed the above documentation for accuracy and completeness, and I agree with the above.  Forest Gleason, MD

## 2021-01-11 ENCOUNTER — Encounter: Payer: Self-pay | Admitting: Dermatology

## 2021-02-07 ENCOUNTER — Other Ambulatory Visit: Payer: Self-pay

## 2021-02-07 ENCOUNTER — Ambulatory Visit (INDEPENDENT_AMBULATORY_CARE_PROVIDER_SITE_OTHER): Payer: Self-pay | Admitting: Dermatology

## 2021-02-07 DIAGNOSIS — L988 Other specified disorders of the skin and subcutaneous tissue: Secondary | ICD-10-CM

## 2021-02-07 NOTE — Patient Instructions (Signed)

## 2021-02-07 NOTE — Progress Notes (Signed)
   Follow-Up Visit   Subjective  Lisa Dennis is a 55 y.o. female who presents for the following: Facial Elastosis (Patient here today for botox injections. ).   The following portions of the chart were reviewed this encounter and updated as appropriate:   Tobacco  Allergies  Meds  Problems  Med Hx  Surg Hx  Fam Hx      Review of Systems:  No other skin or systemic complaints except as noted in HPI or Assessment and Plan.  Objective  Well appearing patient in no apparent distress; mood and affect are within normal limits.  A focused examination was performed including face. Relevant physical exam findings are noted in the Assessment and Plan.  face Rhytides and volume loss.       Assessment & Plan  Elastosis of skin face  Patient advised the eyebrows will drop with treatment of the lower forehead. Patient advises she is ok with not being able to lift brows, that her top priority is treating the deeper lines just above her brows.  Filling material injection - face Location: See attached image  Informed consent: Discussed risks (infection, pain, bleeding, bruising, swelling, allergic reaction, paralysis of nearby muscles, eyelid droop, double vision, neck weakness, difficulty breathing, headache, undesirable cosmetic result, and need for additional treatment) and benefits of the procedure, as well as the alternatives.  Informed consent was obtained.  Preparation: The area was cleansed with alcohol.  Procedure Details:  Botox was injected into the dermis with a 30-gauge needle. Pressure applied to any bleeding. Ice packs offered for swelling.  Lot Number:  [id number] Expiration:  10/2022  Total Units Injected:  21.5  Plan: PTylenol may be used for headache.  Allow 2 weeks before returning to clinic for additional dosing as needed. Patient will call for any problems.   Return for as scheduled.  Graciella Belton, RMA, am acting as scribe for Forest Gleason,  MD .  Documentation: I have reviewed the above documentation for accuracy and completeness, and I agree with the above.  Forest Gleason, MD

## 2021-02-20 ENCOUNTER — Encounter: Payer: Self-pay | Admitting: Dermatology

## 2021-03-07 ENCOUNTER — Ambulatory Visit (INDEPENDENT_AMBULATORY_CARE_PROVIDER_SITE_OTHER): Payer: Self-pay | Admitting: Dermatology

## 2021-03-07 ENCOUNTER — Other Ambulatory Visit: Payer: Self-pay

## 2021-03-07 DIAGNOSIS — L988 Other specified disorders of the skin and subcutaneous tissue: Secondary | ICD-10-CM

## 2021-03-07 NOTE — Patient Instructions (Signed)

## 2021-03-07 NOTE — Progress Notes (Signed)
° °  Follow-Up Visit   Subjective  Lisa Dennis is a 55 y.o. female who presents for the following: Facial Elastosis (Patient here today for botox injections at the lips.).  Patient pleased with prior botox injections.  The following portions of the chart were reviewed this encounter and updated as appropriate:   Tobacco   Allergies   Meds   Problems   Med Hx   Surg Hx   Fam Hx       Review of Systems:  No other skin or systemic complaints except as noted in HPI or Assessment and Plan.  Objective  Well appearing patient in no apparent distress; mood and affect are within normal limits.  A focused examination was performed including face. Relevant physical exam findings are noted in the Assessment and Plan.  Lips Rhytides and volume loss.        Assessment & Plan  Elastosis of skin Lips  Filling material injection - Lips Location: See attached image  Informed consent: Discussed risks (infection, pain, bleeding, bruising, swelling, allergic reaction, paralysis of nearby muscles, eyelid droop, double vision, neck weakness, difficulty breathing, headache, undesirable cosmetic result, and need for additional treatment) and benefits of the procedure, as well as the alternatives.  Informed consent was obtained.  Preparation: The area was cleansed with alcohol.  Procedure Details:  Botox was injected into the dermis with a 30-gauge needle. Pressure applied to any bleeding. Ice packs offered for swelling.  Lot Number:  [id number] Expiration:  07/2023  Total Units Injected:  8  Plan: Tylenol may be used for headache.  Allow 2 weeks before returning to clinic for additional dosing as needed. Patient will call for any problems.    Return for as scheduled, Botox.  Graciella Belton, RMA, am acting as scribe for Forest Gleason, MD .  Documentation: I have reviewed the above documentation for accuracy and completeness, and I agree with the above.  Forest Gleason, MD

## 2021-04-03 ENCOUNTER — Ambulatory Visit: Payer: BC Managed Care – PPO | Admitting: Dermatology

## 2021-04-12 ENCOUNTER — Ambulatory Visit: Payer: BC Managed Care – PPO | Admitting: Dermatology

## 2021-04-26 ENCOUNTER — Other Ambulatory Visit: Payer: Self-pay

## 2021-04-26 ENCOUNTER — Ambulatory Visit (INDEPENDENT_AMBULATORY_CARE_PROVIDER_SITE_OTHER): Payer: BC Managed Care – PPO | Admitting: Dermatology

## 2021-04-26 DIAGNOSIS — L988 Other specified disorders of the skin and subcutaneous tissue: Secondary | ICD-10-CM

## 2021-04-26 NOTE — Progress Notes (Signed)
° °  Follow-Up Visit   Subjective  Lisa Dennis is a 56 y.o. female who presents for the following: Facial Elastosis (Patient here today for Botox injections. ).  Patient pleased with current dosing.   The following portions of the chart were reviewed this encounter and updated as appropriate:   Tobacco   Allergies   Meds   Problems   Med Hx   Surg Hx   Fam Hx       Review of Systems:  No other skin or systemic complaints except as noted in HPI or Assessment and Plan.  Objective  Well appearing patient in no apparent distress; mood and affect are within normal limits.  A focused examination was performed including face. Relevant physical exam findings are noted in the Assessment and Plan.  face Rhytides and volume loss.        Assessment & Plan  Elastosis of skin face  Filling material injection - face Location: See attached image  Informed consent: Discussed risks (infection, pain, bleeding, bruising, swelling, allergic reaction, paralysis of nearby muscles, eyelid droop, double vision, neck weakness, difficulty breathing, headache, undesirable cosmetic result, and need for additional treatment) and benefits of the procedure, as well as the alternatives.  Informed consent was obtained.  Preparation: The area was cleansed with alcohol.  Procedure Details:  Botox was injected into the dermis with a 30-gauge needle. Pressure applied to any bleeding. Ice packs offered for swelling.  Lot Number:  [id number] Expiration:  04/2023  Total Units Injected:  21.5  Plan: Tylenol may be used for headache.  Allow 2 weeks before returning to clinic for additional dosing as needed. Patient will call for any problems.    Return in about 3 months (around 07/24/2021) for Botox.  Graciella Belton, RMA, am acting as scribe for Forest Gleason, MD .  Documentation: I have reviewed the above documentation for accuracy and completeness, and I agree with the above.  Forest Gleason,  MD

## 2021-04-26 NOTE — Patient Instructions (Signed)

## 2021-05-02 ENCOUNTER — Telehealth: Payer: Self-pay

## 2021-05-02 NOTE — Telephone Encounter (Signed)
Interesting since we did the same dosing as prior. Recommend adding to clinic next week (to give full 2 weeks for effect) and we can adjust as needed at that time. Thank you!

## 2021-05-02 NOTE — Telephone Encounter (Signed)
Patient called and left a voicemail on the nurse line. She states she now has a new wrinkle above her eyebrow and she also has seen no results/improvement with lips.

## 2021-05-03 ENCOUNTER — Encounter: Payer: Self-pay | Admitting: Dermatology

## 2021-05-08 ENCOUNTER — Other Ambulatory Visit: Payer: Self-pay

## 2021-05-08 ENCOUNTER — Ambulatory Visit (INDEPENDENT_AMBULATORY_CARE_PROVIDER_SITE_OTHER): Payer: BC Managed Care – PPO | Admitting: Dermatology

## 2021-05-08 DIAGNOSIS — D229 Melanocytic nevi, unspecified: Secondary | ICD-10-CM

## 2021-05-08 DIAGNOSIS — L650 Telogen effluvium: Secondary | ICD-10-CM

## 2021-05-08 DIAGNOSIS — D2261 Melanocytic nevi of right upper limb, including shoulder: Secondary | ICD-10-CM | POA: Diagnosis not present

## 2021-05-08 DIAGNOSIS — L659 Nonscarring hair loss, unspecified: Secondary | ICD-10-CM

## 2021-05-08 DIAGNOSIS — L988 Other specified disorders of the skin and subcutaneous tissue: Secondary | ICD-10-CM | POA: Diagnosis not present

## 2021-05-08 DIAGNOSIS — E568 Deficiency of other vitamins: Secondary | ICD-10-CM

## 2021-05-08 DIAGNOSIS — E569 Vitamin deficiency, unspecified: Secondary | ICD-10-CM

## 2021-05-08 DIAGNOSIS — L720 Epidermal cyst: Secondary | ICD-10-CM | POA: Diagnosis not present

## 2021-05-08 NOTE — Patient Instructions (Addendum)
Recommend minoxidil 5% (Rogaine for men) solution or foam to be applied to the scalp and left in. This should ideally be used twice daily for best results but it helps with hair regrowth when used at least three times per week. Rogaine initially can cause increased hair shedding for the first few weeks but this will stop with continued use. In studies, people who used minoxidil (Rogaine) for at least 6 months had thicker hair than people who did not. Minoxidil topical (Rogaine) only works as long as it continues to be used. If if it is no longer used then the hair it has been helping to regrow can fall out. Minoxidil topical (Rogaine) can cause increased facial hair growth.   Gentle Skin Care Guide  1. Bathe no more than once a day.  2. Avoid bathing in hot water  3. Use a mild soap like Dove, Vanicream, Cetaphil, CeraVe. Can use Lever 2000 or Cetaphil antibacterial soap  4. Use soap only where you need it. On most days, use it under your arms, between your legs, and on your feet. Let the water rinse other areas unless visibly dirty.  5. When you get out of the bath/shower, use a towel to gently blot your skin dry, don't rub it.  6. While your skin is still a little damp, apply a moisturizing cream such as Vanicream, CeraVe, Cetaphil, Eucerin, Sarna lotion or plain Vaseline Jelly. For hands apply Neutrogena [place] Hand Cream or Excipial Hand Cream.  7. Reapply moisturizer any time you start to itch or feel dry.  8. Sometimes using free and clear laundry detergents can be helpful. Fabric softener sheets should be avoided. Downy Free & Gentle liquid, or any liquid fabric softener that is free of dyes and perfumes, it acceptable to use  9. If your doctor has given you prescription creams you may apply moisturizers over them      If You Need Anything After Your Visit  If you have any questions or concerns for your doctor, please call our main line at [phone] and press option 4 to  reach your doctor's medical assistant. If no one answers, please leave a voicemail as directed and we will return your call as soon as possible. Messages left after 4 pm will be answered the following business day.   You may also send [place] a message via [place]. We typically respond to MyChart messages within 1-2 business days.  For prescription refills, please ask your pharmacy to contact our office. Our fax number is [phone].  If you have an urgent issue when the clinic is closed that cannot wait until the next business day, you can page your doctor at the number below.    Please note that while we do our best to be available for urgent issues outside of office hours, we are not available 24/7.   If you have an urgent issue and are unable to reach [place], you may choose to seek medical care at your doctor's office, retail clinic, urgent care center, or emergency room.  If you have a medical emergency, please immediately call 911 or go to the emergency department.  Pager Numbers  - Dr. Nehemiah Massed: [phone]  - Dr. Laurence Ferrari: [phone]  - Dr. Nicole Kindred: [phone]  In the event of inclement weather, please call our main line at [phone] for an update on the status of any delays or closures.  Dermatology Medication Tips: Please keep the boxes that topical medications come in in order to help keep track  of the instructions about where and how to use these. Pharmacies typically print the medication instructions only on the boxes and not directly on the medication tubes.   If your medication is too expensive, please contact our office at [phone] option 4 or send [place] a message through [place].   We are unable to tell what your co-pay for medications will be in advance as this is different depending on your insurance coverage. However, we may be able to find a substitute medication at lower cost or fill out paperwork to get insurance to cover a needed medication.   If a prior  authorization is required to get your medication covered by your insurance company, please allow [place] 1-2 business days to complete this process.  Drug prices often vary depending on where the prescription is filled and some pharmacies may offer cheaper prices.  The website www.goodrx.com contains coupons for medications through different pharmacies. The prices here do not account for what the cost may be with help from insurance (it may be cheaper with your insurance), but the website can give you the price if you did not use any insurance.  - You can print the associated coupon and take it with your prescription to the pharmacy.  - You may also stop by our office during regular business hours and pick up a GoodRx coupon card.  - If you need your prescription sent electronically to a different pharmacy, notify our office through [facility] or by phone at [phone] option 4.     Si Usted Necesita Algo Despus de Su Visita  Tambin puede enviarnos un mensaje a travs de Pharmacist, community. Por lo general respondemos a los mensajes de MyChart en el transcurso de 1 a 2 das hbiles.  Para renovar recetas, por favor pida a su farmacia que se ponga en contacto con nuestra oficina. Harland Dingwall de fax es [place] [phone].  Si tiene un asunto urgente cuando la clnica est cerrada y que no puede esperar hasta el siguiente da hbil, puede llamar/localizar a su doctor(a) al nmero que aparece a continuacin.   Por favor, tenga en cuenta que aunque hacemos todo lo posible para estar disponibles para asuntos urgentes fuera del horario de [place], no estamos disponibles las 24 horas del da, los 7 das de la [place].   Si tiene un problema urgente y no puede comunicarse con nosotros, puede optar por buscar atencin mdica  en el consultorio de su doctor(a), en una clnica privada, en un centro de atencin urgente o en una sala de emergencias.  Si tiene Engineering geologist, por favor llame  inmediatamente al 911 o vaya a la sala de emergencias.  Nmeros de bper  - Dr. Nehemiah Massed: [phone]  - Dra. Moye: [phone]  - Dra. Nicole Kindred: [phone]  En caso de inclemencias del [place], por favor llame a Johnsie Kindred principal al [phone] para una actualizacin sobre el [place] de cualquier retraso o cierre.  Consejos para la medicacin en dermatologa: Por favor, guarde las cajas en las que vienen los medicamentos de uso tpico para ayudarle a seguir las instrucciones sobre dnde y cmo usarlos. Las farmacias generalmente imprimen las instrucciones del medicamento slo en las cajas y no directamente en los tubos del [place].   Si su medicamento es muy caro, por favor, pngase en contacto con Zigmund Daniel llamando al [phone] y presione la opcin 4 o envenos un mensaje a travs de Pharmacist, community.   No podemos decirle cul ser su copago por los medicamentos por  adelantado ya que esto es diferente dependiendo de la cobertura de su seguro. Sin embargo, es posible que podamos encontrar un medicamento sustituto a Electrical engineer un formulario para que el seguro cubra el medicamento que se considera necesario.   Si se requiere una autorizacin previa para que su compaa de seguros [place] su medicamento, por favor permtanos de 1 a 2 das hbiles para completar este proceso.  Los precios de los medicamentos varan con frecuencia dependiendo del Environmental consultant de dnde se surte la receta y alguna farmacias pueden ofrecer precios ms baratos.  El sitio web www.goodrx.com tiene cupones para medicamentos de Airline pilot. Los precios aqu no tienen en cuenta lo que podra costar con la ayuda del seguro (puede ser ms barato con su seguro), pero el sitio web puede darle el precio si no utiliz Research scientist (physical sciences).  - Puede imprimir el cupn correspondiente y llevarlo con su receta a la farmacia.  - Tambin puede pasar por nuestra oficina durante el horario de atencin regular y Charity fundraiser  una tarjeta de cupones de GoodRx.  - Si necesita que su receta se enve electrnicamente a una farmacia diferente, informe a nuestra oficina a travs de MyChart de [place] o por telfono llamando al [phone] y presione la opcin 4.

## 2021-05-08 NOTE — Progress Notes (Signed)
Follow-Up Visit   Subjective  Lisa Dennis is a 56 y.o. female who presents for the following: Facial Elastosis (Patient here today to talk about Botox injections done 12 days ago. Patient advises there is a dent above eyebrow and she doesn't think the lips had any improvement. She also has a spot at upper back that she would like checked. ).  Patient also wants to talk about hair loss and xerosis.   The following portions of the chart were reviewed this encounter and updated as appropriate:   Tobacco   Allergies   Meds   Problems   Med Hx   Surg Hx   Fam Hx       Review of Systems:  No other skin or systemic complaints except as noted in HPI or Assessment and Plan.  Objective  Well appearing patient in no apparent distress; mood and affect are within normal limits.  A focused examination was performed including face, scalp. Relevant physical exam findings are noted in the Assessment and Plan.  Lips Rhytides and volume loss.      Right Shoulder - Anterior Pink cobblestone, slightly waxy papule  Left Upper Back 0.5 cm pink papule with firm center  Scalp Diffuse thinning of hair, positive hair pull test.     Assessment & Plan  Elastosis of skin Lips  Patient noticing line from lateral corrugator on the right. Discussed we have not treated the corrugators and glabellar complex, and this line was visible in her initial photos. Recommend treating glabellar complex.  Filling material injection - Lips Location: See attached image  Informed consent: Discussed risks (infection, pain, bleeding, bruising, swelling, allergic reaction, paralysis of nearby muscles, eyelid droop, double vision, neck weakness, difficulty breathing, headache, undesirable cosmetic result, and need for additional treatment) and benefits of the procedure, as well as the alternatives.  Informed consent was obtained.  Preparation: The area was cleansed with alcohol.  Procedure Details:  Botox  was injected into the dermis with a 30-gauge needle. Pressure applied to any bleeding. Ice packs offered for swelling.  Lot Number:  [id number] Expiration:  05/2023  Total Units Injected:  18.5  Plan: Tylenol may be used for headache.  Allow 2 weeks before returning to clinic for additional dosing as needed. Patient will call for any problems.   Nevus Right Shoulder - Anterior  Vs SK.  Benign-appearing.  Observation.  Call clinic for new or changing lesions.  Recommend daily use of broad spectrum spf 30+ sunscreen to sun-exposed areas.    Epidermal inclusion cyst Left Upper Back  History of extruding linear material  Benign-appearing. Exam most consistent with an epidermal inclusion cyst. Discussed that a cyst is a benign growth that can grow over time and sometimes get irritated or inflamed. Recommend observation if it is not bothersome. Discussed option of surgical excision to remove it if it is growing, symptomatic, or other changes noted. Please call for new or changing lesions so they can be evaluated.  Telogen effluvium Scalp  Present for 2-3 months. 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 It does not progress to baldness, and the hair eventually grows back on its own. It can be triggered by recent illness, recent surgery, thyroid disease, low iron  stores, vitamin D deficiency, fad diets or rapid weight loss, hormonal changes such as pregnancy or birth control pills, and some medication. Usually the hair loss starts 2-3 months after the illness or health change. Rarely, it can continue for longer than a year.   Vitamin deficiency Right Lower Leg - Posterior  Related Procedures Ferritin Vitamin D,  25-hydroxy   Return for Botox, as scheduled.  Graciella Belton, RMA, am acting as scribe for Forest Gleason, MD .  Documentation: I have reviewed the above documentation for accuracy and completeness, and I agree with the above.  Forest Gleason, MD

## 2021-05-11 ENCOUNTER — Encounter: Payer: Self-pay | Admitting: Dermatology

## 2021-05-14 ENCOUNTER — Encounter: Payer: Self-pay | Admitting: Dermatology

## 2021-06-07 ENCOUNTER — Ambulatory Visit: Payer: BC Managed Care – PPO | Admitting: Dermatology

## 2021-06-13 ENCOUNTER — Encounter: Payer: BC Managed Care – PPO | Admitting: Dermatology

## 2021-06-19 LAB — VITAMIN D 25 HYDROXY (VIT D DEFICIENCY, FRACTURES): Vit D, 25-Hydroxy: 22.4 ng/mL — ABNORMAL LOW (ref 30.0–100.0)

## 2021-06-19 LAB — FERRITIN: Ferritin: 137 ng/mL (ref 15–150)

## 2021-07-31 ENCOUNTER — Ambulatory Visit (INDEPENDENT_AMBULATORY_CARE_PROVIDER_SITE_OTHER): Payer: BC Managed Care – PPO | Admitting: Dermatology

## 2021-07-31 DIAGNOSIS — D485 Neoplasm of uncertain behavior of skin: Secondary | ICD-10-CM

## 2021-07-31 DIAGNOSIS — L720 Epidermal cyst: Secondary | ICD-10-CM

## 2021-07-31 MED ORDER — MUPIROCIN 2 % EX OINT: 1.0000 "application " | TOPICAL_OINTMENT | Freq: Every day | CUTANEOUS | 0 refills | Status: DC

## 2021-07-31 NOTE — Patient Instructions (Signed)
Wound Care Instructions ? ?On the day following your surgery, you should begin doing daily dressing changes: ?Remove the old dressing and discard it. ?Cleanse the wound gently with tap water. This may be done in the shower or by placing a wet gauze pad directly on the wound and letting it soak for several minutes. ?It is important to gently remove any dried blood from the wound in order to encourage healing. This may be done by gently rolling a moistened Q-tip on the dried blood. Do not pick at the wound. ?If the wound should start to bleed, continue cleaning the wound, then place a moist gauze pad on the wound and hold pressure for a few minutes.  ?Make sure you then dry the skin surrounding the wound completely or the tape will not stick to the skin. Do not use cotton balls on the wound. ?After the wound is clean and dry, apply the ointment gently with a Q-tip. ?Cut a non-stick pad to fit the size of the wound. Lay the pad flush to the wound. If the wound is draining, you may want to reinforce it with a small amount of gauze on top of the non-stick pad for a little added compression to the area. ?Use the tape to seal the area completely. ?Select from the following with respect to your individual situation: ?If your wound has been stitched closed: continue the above steps 1-8 at least daily until your sutures are removed. ?If your wound has been left open to heal: continue steps 1-8 at least daily for the first 3-4 weeks. ?We would like for you to take a few extra precautions for at least the next week. ?Sleep with your head elevated on pillows if our wound is on your head. ?Do not bend over or lift heavy items to reduce the chance of elevated blood pressure to the wound ?Do not participate in particularly strenuous activities. ? ? ?Below is a list of dressing supplies you might need.  ?Cotton-tipped applicators - Q-tips ?Gauze pads (2x2 and/or 4x4) - All-Purpose Sponges ?Non-stick dressing material - Telfa ?Tape -  Paper or Hypafix ?New and clean tube of petroleum jelly - Vaseline  ? ? ?Comments on Post-Operative Period ?Slight swelling and redness often appear around the wound. This is normal and will disappear within several days following the surgery. ?The healing wound will drain a brownish-red-yellow discharge during healing. This is a normal phase of wound healing. As the wound begins to heal, the drainage may increase in amount. Again, this drainage is normal. ?Notify us if the drainage becomes persistently bloody, excessively swollen, or intensely painful or develops a foul odor or red streaks.  ?If you should experience mild discomfort during the healing phase, you may take an aspirin-free medication such as Tylenol (acetaminophen). Notify us if the discomfort is severe or persistent. Avoid alcoholic beverages when taking pain medicine. ? ?In Case of Wound Hemorrhage ?A wound hemorrhage is when the bandage suddenly becomes soaked with bright red blood and flows profusely. If this happens, sit down or lie down with your head elevated. If the wound has a dressing on it, do not remove the dressing. Apply pressure to the existing gauze. If the wound is not covered, use a gauze pad to apply pressure and continue applying the pressure for 20 minutes without peeking. DO NOT COVER THE WOUND WITH A LARGE TOWEL OR WASH CLOTH. Release your hand from the wound site but do not remove the dressing. If the bleeding has stopped,   gently clean around the wound. Leave the dressing in place for 24 hours if possible. This wait time allows the blood vessels to close off so that you do not spark a new round of bleeding by disrupting the newly clotted blood vessels with an immediate dressing change. If the bleeding does not subside, continue to hold pressure. If matters are out of your control, contact an After Hours clinic or go to the Emergency Room. ? ? ? ?If You Need Anything After Your Visit ? ?If you have any questions or concerns for  your doctor, please call our main line at [phone] and press option 4 to reach your doctor's medical assistant. If no one answers, please leave a voicemail as directed and we will return your call as soon as possible. Messages left after 4 pm will be answered the following business day.  ? ?You may also send [place] a message via MyChart. We typically respond to MyChart messages within 1-2 business days. ? ?For prescription refills, please ask your pharmacy to contact our office. Our fax number is [phone]. ? ?If you have an urgent issue when the clinic is closed that cannot wait until the next business day, you can page your doctor at the number below.   ? ?Please note that while we do our best to be available for urgent issues outside of office hours, we are not available 24/7.  ? ?If you have an urgent issue and are unable to reach [place], you may choose to seek medical care at your doctor's office, retail clinic, urgent care center, or emergency room. ? ?If you have a medical emergency, please immediately call 911 or go to the emergency department. ? ?Pager Numbers ? ?- Dr. Nehemiah Massed: [phone] ? ?- Dr. Laurence Ferrari: [phone] ? ?- Dr. Nicole Kindred: [phone] ? ?In the event of inclement weather, please call our main line at [phone] for an update on the status of any delays or closures. ? ?Dermatology Medication Tips: ?Please keep the boxes that topical medications come in in order to help keep track of the instructions about where and how to use these. Pharmacies typically print the medication instructions only on the boxes and not directly on the medication tubes.  ? ?If your medication is too expensive, please contact our office at [phone] option 4 or send [place] a message through [place].  ? ?We are unable to tell what your co-pay for medications will be in advance as this is different depending on your insurance coverage. However, we may be able to find a substitute medication at lower cost or fill out  paperwork to get insurance to cover a needed medication.  ? ?If a prior authorization is required to get your medication covered by your insurance company, please allow [place] 1-2 business days to complete this process. ? ?Drug prices often vary depending on where the prescription is filled and some pharmacies may offer cheaper prices. ? ?The website www.goodrx.com contains coupons for medications through different pharmacies. The prices here do not account for what the cost may be with help from insurance (it may be cheaper with your insurance), but the website can give you the price if you did not use any insurance.  ?- You can print the associated coupon and take it with your prescription to the pharmacy.  ?- You may also stop by our office during regular business hours and pick up a GoodRx coupon card.  ?- If you need your prescription sent electronically to a different pharmacy, notify our  office through [facility] or by phone at [phone] option 4. ? ? ? ? ?Si Usted Necesita Algo Despu?s de Su Visita ? ?Tambi?n puede enviarnos un mensaje a trav?s de MyChart. Por lo general respondemos a los mensajes de MyChart en el transcurso de 1 a 2 d?as h?biles. ? ?Para renovar recetas, por favor pida a su farmacia que se ponga en contacto con nuestra oficina. Nuestro n?mero de fax es el [phone]. ? ?Si tiene un asunto urgente cuando la cl?nica est? cerrada y que no puede esperar hasta el siguiente d?a h?bil, puede llamar/localizar a su doctor(a) al n?mero que aparece a continuaci?n.  ? ?Por favor, tenga en cuenta que aunque hacemos todo lo posible para estar disponibles para asuntos urgentes fuera del horario de oficina, no estamos disponibles las 24 horas del d?a, los 7 d?as de la semana.  ? ?Si tiene un problema urgente y no puede comunicarse con nosotros, puede optar por buscar atenci?n m?dica  en el consultorio de su doctor(a), en una cl?nica privada, en un centro de atenci?n urgente o en una sala de  emergencias. ? ?Si tiene Engineer, maintenance (IT) m?dica, por favor llame inmediatamente al 911 o vaya a la sala de emergencias. ? ?N?meros de b?per ? ?- Dr. Nehemiah Massed: [phone] ? ?- Dra. Moye: [phone] ? ?- Dra

## 2021-07-31 NOTE — Progress Notes (Signed)
   Follow-Up Visit   Subjective  Lisa Dennis is a 56 y.o. female who presents for the following: Cyst (Vs other of left upper back - excise today).  The following portions of the chart were reviewed this encounter and updated as appropriate:   Tobacco  Allergies  Meds  Problems  Med Hx  Surg Hx  Fam Hx     Review of Systems:  No other skin or systemic complaints except as noted in HPI or Assessment and Plan.  Objective  Well appearing patient in no apparent distress; mood and affect are within normal limits.  A focused examination was performed including back. Relevant physical exam findings are noted in the Assessment and Plan.  Left Upper Back Cystic papule   Assessment & Plan  Neoplasm of uncertain behavior of skin Left Upper Back  Skin excision  Lesion length (cm):  1.1 Lesion width (cm):  1.1 Margin per side (cm):  0 Total excision diameter (cm):  1.1 Informed consent: discussed and consent obtained   Timeout: patient name, date of birth, surgical site, and procedure verified   Procedure prep:  Patient was prepped and draped in usual sterile fashion Prep type:  Isopropyl alcohol and povidone-iodine Anesthesia: the lesion was anesthetized in a standard fashion   Anesthetic:  1% lidocaine w/ epinephrine 1-100,000 buffered w/ 8.4% NaHCO3 Instrument used: #15 blade   Hemostasis achieved with: pressure   Hemostasis achieved with comment:  Electrocautery Outcome: patient tolerated procedure well with no complications   Post-procedure details: sterile dressing applied and wound care instructions given   Dressing type: bandage and pressure dressing (mupirocin)    Skin repair Complexity:  Complex Final length (cm):  2 Reason for type of repair: reduce tension to allow closure, reduce the risk of dehiscence, infection, and necrosis, reduce subcutaneous dead space and avoid a hematoma, allow closure of the large defect, preserve normal anatomy, preserve  normal anatomical and functional relationships and enhance both functionality and cosmetic results   Undermining: area extensively undermined   Undermining comment:  Undermining defect 1.1 cm Subcutaneous layers (deep stitches):  Suture size:  4-0 Suture type: Vicryl (polyglactin 910)   Subcutaneous suture technique: inverted dermal. Fine/surface layer approximation (top stitches):  Suture size:  4-0 Suture type: nylon   Stitches: simple running   Suture removal (days):  7 Hemostasis achieved with: suture and pressure Outcome: patient tolerated procedure well with no complications   Post-procedure details: sterile dressing applied and wound care instructions given   Dressing type: bandage and pressure dressing (mupirocin)    mupirocin ointment (BACTROBAN) 2 % Apply 1 application. topically daily. With dressing changes  Specimen 1 - Surgical pathology Differential Diagnosis: Cyst vs other  Check Margins: No  Start Mupirocin oint qd to excision site   Return for suture removal.  I, Ashok Cordia, CMA, am acting as scribe for Sarina Ser, MD . Documentation: I have reviewed the above documentation for accuracy and completeness, and I agree with the above.  Sarina Ser, MD

## 2021-08-02 ENCOUNTER — Telehealth: Payer: Self-pay

## 2021-08-02 NOTE — Telephone Encounter (Signed)
-----   Message from Ralene Bathe, MD sent at 08/01/2021  5:11 PM EDT ----- ?Diagnosis ?Skin (M), left upper back ?EXCISION, EPIDERMOID CYST ? ?Benign cyst ?

## 2021-08-02 NOTE — Telephone Encounter (Signed)
LMOVM please return my call  ?

## 2021-08-02 NOTE — Telephone Encounter (Signed)
Discussed biopsy results with pt  °

## 2021-08-07 ENCOUNTER — Ambulatory Visit (INDEPENDENT_AMBULATORY_CARE_PROVIDER_SITE_OTHER): Payer: Self-pay | Admitting: Dermatology

## 2021-08-07 DIAGNOSIS — L720 Epidermal cyst: Secondary | ICD-10-CM

## 2021-08-07 DIAGNOSIS — L988 Other specified disorders of the skin and subcutaneous tissue: Secondary | ICD-10-CM

## 2021-08-07 NOTE — Progress Notes (Signed)
   Follow-Up Visit   Subjective  Lisa Dennis is a 56 y.o. female who presents for the following: Facial Elastosis (Pt here for Botox ) and Suture / Staple Removal (Left upper back-biopsy proven cyst excision 07/31/2021).  The following portions of the chart were reviewed this encounter and updated as appropriate:   Tobacco  Allergies  Meds  Problems  Med Hx  Surg Hx  Fam Hx     Review of Systems:  No other skin or systemic complaints except as noted in HPI or Assessment and Plan.  Objective  Well appearing patient in no apparent distress; mood and affect are within normal limits.  A focused examination was performed including face,neck. Relevant physical exam findings are noted in the Assessment and Plan.  Head - Anterior (Face) Rhytides and volume loss.      left upper back Well healing scar    Assessment & Plan  Elastosis of skin Head - Anterior (Face) Botox  Forehead 6.25 units  Lip 6 units   Intralesional injection - Head - Anterior (Face) Location: See attached image  Informed consent: Discussed risks (infection, pain, bleeding, bruising, swelling, allergic reaction, paralysis of nearby muscles, eyelid droop, double vision, neck weakness, difficulty breathing, headache, undesirable cosmetic result, and need for additional treatment) and benefits of the procedure, as well as the alternatives.  Informed consent was obtained.  Preparation: The area was cleansed with alcohol.  Procedure Details:  Botox was injected into the dermis with a 30-gauge needle. Pressure applied to any bleeding. Ice packs offered for swelling.  Lot Number:  [id number] Expiration:  06/24/2023  Total Units Injected:  12.25   Plan: Patient was instructed to remain upright for 4 hours. Patient was instructed to avoid massaging the face and avoid vigorous exercise for the rest of the day. Tylenol may be used for headache.  Allow 2 weeks before returning to clinic for additional dosing  as needed. Patient will call for any problems.  Epidermal inclusion cyst left upper back Biopsy results discussed   Encounter for Removal of Sutures - Incision site at the left upper back  is clean, dry and intact - Wound cleansed, sutures removed, wound cleansed and steri strips applied.  - Discussed pathology results showing Epidermal cyst   - Patient advised to keep steri-strips dry until they fall off. - Scars remodel for a full year. - Once steri-strips fall off, patient can apply over-the-counter silicone scar cream each night to help with scar remodeling if desired. - Patient advised to call with any concerns or if they notice any new or changing lesions.   Return for as scheduled to see Dr Laurence Ferrari.  IMarye Round, CMA, am acting as scribe for Sarina Ser, MD .  Documentation: I have reviewed the above documentation for accuracy and completeness, and I agree with the above.  Sarina Ser, MD

## 2021-08-07 NOTE — Patient Instructions (Signed)

## 2021-08-14 ENCOUNTER — Encounter: Payer: Self-pay | Admitting: Dermatology

## 2021-08-18 ENCOUNTER — Encounter: Payer: Self-pay | Admitting: Dermatology

## 2021-08-22 ENCOUNTER — Ambulatory Visit (INDEPENDENT_AMBULATORY_CARE_PROVIDER_SITE_OTHER): Payer: Self-pay | Admitting: Dermatology

## 2021-08-22 DIAGNOSIS — L988 Other specified disorders of the skin and subcutaneous tissue: Secondary | ICD-10-CM

## 2021-08-22 NOTE — Progress Notes (Signed)
   Follow-Up Visit   Subjective  Lisa Dennis is a 56 y.o. female who presents for the following: botox follow up (Patient had botox injected on 08/07/21  to areas at forehead and upper lip. She is here today for follow up. She reports more movement in forehead and some visible lines at upper lip area she would like touched up. ).  The following portions of the chart were reviewed this encounter and updated as appropriate:  Tobacco  Allergies  Meds  Problems  Med Hx  Surg Hx  Fam Hx     Review of Systems: No other skin or systemic complaints except as noted in HPI or Assessment and Plan.  Objective  Well appearing patient in no apparent distress; mood and affect are within normal limits.  A focused examination was performed including forehead and upper lip. Relevant physical exam findings are noted in the Assessment and Plan.  lips and forehead Rhytides and volume loss.       Assessment & Plan  Elastosis of skin lips and forehead  Patient previously injected with botox a total of 12.5 units into the forehead and lips. Forehead 6.25 units  Lip 6 units   Reviewed 04/26/21 photo from prior office visit. Reference this for future botox.   Additional botox injected today to forehead 4.5 units and 2 units into b/l side of lip 6.5 units botox injected today.   Intralesional injection - lips and forehead Location: See attached image  Informed consent: Discussed risks (infection, pain, bleeding, bruising, swelling, allergic reaction, paralysis of nearby muscles, eyelid droop, double vision, neck weakness, difficulty breathing, headache, undesirable cosmetic result, and need for additional treatment) and benefits of the procedure, as well as the alternatives.  Informed consent was obtained.  Preparation: The area was cleansed with alcohol.  Procedure Details:  Botox was injected into the dermis with a 30-gauge needle. Pressure applied to any bleeding. Ice packs offered  for swelling.  Lot Number:  [id number] Expiration:  06/24/2023  Total Units Injected:  6.5   Plan: Tylenol may be used for headache.  Allow 2 weeks before returning to clinic for additional dosing as needed. Patient will call for any problems.  Return for 3 - 3.5  month botox . IRuthell Rummage, CMA, am acting as scribe for Sarina Ser, MD. Documentation: I have reviewed the above documentation for accuracy and completeness, and I agree with the above.  Sarina Ser, MD

## 2021-08-22 NOTE — Patient Instructions (Signed)

## 2021-08-27 ENCOUNTER — Encounter: Payer: Self-pay | Admitting: Dermatology

## 2021-09-17 ENCOUNTER — Telehealth: Payer: Self-pay

## 2021-09-18 NOTE — Telephone Encounter (Signed)
Called as spoke with patient regarding provider's response and recommendations. Patient reports recommendations to not start vitamin d supplement was from transplant coordinator at [facility]. She states they only give reason for not taking it as they did not feel her levels were low enough to start.   Instructed patient to reach out to transplant team again and let them know of Dr. Elvera Lennox reason to start supplement, if they were still in disagreement for any reason we would not recommend starting supplement. Informed patient to call us and update [place] on Transplant provider's response.

## 2021-09-19 NOTE — Telephone Encounter (Signed)
Thank you :)

## 2021-10-16 ENCOUNTER — Encounter: Payer: Self-pay | Admitting: Dermatology

## 2021-10-16 ENCOUNTER — Ambulatory Visit (INDEPENDENT_AMBULATORY_CARE_PROVIDER_SITE_OTHER): Payer: BC Managed Care – PPO | Admitting: Dermatology

## 2021-10-16 DIAGNOSIS — L988 Other specified disorders of the skin and subcutaneous tissue: Secondary | ICD-10-CM

## 2021-10-16 NOTE — Progress Notes (Signed)
   Follow-Up Visit   Subjective  Lisa Dennis is a 56 y.o. female who presents for the following: Facial Elastosis (Patient here today for Botox and possibly filler at the lips. ).   The following portions of the chart were reviewed this encounter and updated as appropriate:   Tobacco  Allergies  Meds  Problems  Med Hx  Surg Hx  Fam Hx      Review of Systems:  No other skin or systemic complaints except as noted in HPI or Assessment and Plan.  Objective  Well appearing patient in no apparent distress; mood and affect are within normal limits.  A focused examination was performed including face. Relevant physical exam findings are noted in the Assessment and Plan.  face Rhytides and volume loss.        Assessment & Plan  Elastosis of skin face  Botox - 40 units total Forehead - 13.5 units Mouth - 8 units Frown Complex - 18.5 units  Filling material injection - face Location: See attached image  Informed consent: Discussed risks (infection, pain, bleeding, bruising, swelling, allergic reaction, paralysis of nearby muscles, eyelid droop, double vision, neck weakness, difficulty breathing, headache, undesirable cosmetic result, and need for additional treatment) and benefits of the procedure, as well as the alternatives.  Informed consent was obtained.  Preparation: The area was cleansed with alcohol.  Procedure Details:  Botox was injected into the dermis with a 30-gauge needle. Pressure applied to any bleeding. Ice packs offered for swelling.  Lot Number:  [id number] Expiration:  11/2023  Total Units Injected:  40  Plan: Patient was instructed to remain upright for 4 hours. Patient was instructed to avoid massaging the face and avoid vigorous exercise for the rest of the day. Tylenol may be used for headache.  Allow 2 weeks before returning to clinic for additional dosing as needed. Patient will call for any problems.    Return in about 3 months  (around 01/16/2022) for Botox.  Graciella Belton, RMA, am acting as scribe for Forest Gleason, MD .  Documentation: I have reviewed the above documentation for accuracy and completeness, and I agree with the above.  Forest Gleason, MD

## 2021-10-16 NOTE — Patient Instructions (Signed)
Due to recent changes in healthcare laws, you may see results of your pathology and/or laboratory studies on MyChart before the doctors have had a chance to review them. We understand that in some cases there may be results that are confusing or concerning to you. Please understand that not all results are received at the same time and often the doctors may need to interpret multiple results in order to provide you with the best plan of care or course of treatment. Therefore, we ask that you please give us 2 business days to thoroughly review all your results before contacting the office for clarification. Should we see a critical lab result, you will be contacted sooner.   If You Need Anything After Your Visit  If you have any questions or concerns for your doctor, please call our main line at [phone] and press option 4 to reach your doctor's medical assistant. If no one answers, please leave a voicemail as directed and we will return your call as soon as possible. Messages left after 4 pm will be answered the following business day.   You may also send us a message via MyChart. We typically respond to MyChart messages within 1-2 business days.  For prescription refills, please ask your pharmacy to contact our office. Our fax number is [phone].  If you have an urgent issue when the clinic is closed that cannot wait until the next business day, you can page your doctor at the number below.    Please note that while we do our best to be available for urgent issues outside of office hours, we are not available 24/7.   If you have an urgent issue and are unable to reach us, you may choose to seek medical care at your doctor's office, retail clinic, urgent care center, or emergency room.  If you have a medical emergency, please immediately call 911 or go to the emergency department.  Pager Numbers  - Dr. Kowalski: [phone]  - Dr. Moye: [phone]  - Dr. Stewart:  [phone]  In the event of inclement weather, please call our main line at [phone] for an update on the status of any delays or closures.  Dermatology Medication Tips: Please keep the boxes that topical medications come in in order to help keep track of the instructions about where and how to use these. Pharmacies typically print the medication instructions only on the boxes and not directly on the medication tubes.   If your medication is too expensive, please contact our office at [phone] option 4 or send us a message through MyChart.   We are unable to tell what your co-pay for medications will be in advance as this is different depending on your insurance coverage. However, we may be able to find a substitute medication at lower cost or fill out paperwork to get insurance to cover a needed medication.   If a prior authorization is required to get your medication covered by your insurance company, please allow us 1-2 business days to complete this process.  Drug prices often vary depending on where the prescription is filled and some pharmacies may offer cheaper prices.  The website www.goodrx.com contains coupons for medications through different pharmacies. The prices here do not account for what the cost may be with help from insurance (it may be cheaper with your insurance), but the website can give you the price if you did not use any insurance.  - You can print the associated coupon and take it with   your prescription to the pharmacy.  - You may also stop by our office during regular business hours and pick up a GoodRx coupon card.  - If you need your prescription sent electronically to a different pharmacy, notify our office through [place] MyChart or by phone at [phone] option 4.     Si Usted Necesita Algo Despus de Su Visita  Tambin puede enviarnos un mensaje a travs de MyChart. Por lo general respondemos a los mensajes de MyChart en el transcurso de 1 a 2  das hbiles.  Para renovar recetas, por favor pida a su farmacia que se ponga en contacto con nuestra oficina. Nuestro nmero de fax es el [phone].  Si tiene un asunto urgente cuando la clnica est cerrada y que no puede esperar hasta el siguiente da hbil, puede llamar/localizar a su doctor(a) al nmero que aparece a continuacin.   Por favor, tenga en cuenta que aunque hacemos todo lo posible para estar disponibles para asuntos urgentes fuera del horario de oficina, no estamos disponibles las 24 horas del da, los 7 das de la semana.   Si tiene un problema urgente y no puede comunicarse con nosotros, puede optar por buscar atencin mdica  en el consultorio de su doctor(a), en una clnica privada, en un centro de atencin urgente o en una sala de emergencias.  Si tiene una emergencia mdica, por favor llame inmediatamente al 911 o vaya a la sala de emergencias.  Nmeros de bper  - Dr. Kowalski: [phone]  - Dra. Moye: [phone]  - Dra. Stewart: [phone]  En caso de inclemencias del tiempo, por favor llame a nuestra lnea principal al [phone] para una actualizacin sobre el estado de cualquier retraso o cierre.  Consejos para la medicacin en dermatologa: Por favor, guarde las cajas en las que vienen los medicamentos de uso tpico para ayudarle a seguir las instrucciones sobre dnde y cmo usarlos. Las farmacias generalmente imprimen las instrucciones del medicamento slo en las cajas y no directamente en los tubos del medicamento.   Si su medicamento es muy caro, por favor, pngase en contacto con nuestra oficina llamando al [phone] y presione la opcin 4 o envenos un mensaje a travs de MyChart.   No podemos decirle cul ser su copago por los medicamentos por adelantado ya que esto es diferente dependiendo de la cobertura de su seguro. Sin embargo, es posible que podamos encontrar un medicamento sustituto a menor costo o llenar un formulario para que el  seguro cubra el medicamento que se considera necesario.   Si se requiere una autorizacin previa para que su compaa de seguros cubra su medicamento, por favor permtanos de 1 a 2 das hbiles para completar este proceso.  Los precios de los medicamentos varan con frecuencia dependiendo del lugar de dnde se surte la receta y alguna farmacias pueden ofrecer precios ms baratos.  El sitio web www.goodrx.com tiene cupones para medicamentos de diferentes farmacias. Los precios aqu no tienen en cuenta lo que podra costar con la ayuda del seguro (puede ser ms barato con su seguro), pero el sitio web puede darle el precio si no utiliz ningn seguro.  - Puede imprimir el cupn correspondiente y llevarlo con su receta a la farmacia.  - Tambin puede pasar por nuestra oficina durante el horario de atencin regular y recoger una tarjeta de cupones de GoodRx.  - Si necesita que su receta se enve electrnicamente a una farmacia diferente, informe a nuestra oficina a travs de MyChart de [place]   o por telfono llamando al [phone] y presione la opcin 4.  

## 2021-10-21 ENCOUNTER — Encounter: Payer: Self-pay | Admitting: Dermatology

## 2021-10-24 ENCOUNTER — Ambulatory Visit (INDEPENDENT_AMBULATORY_CARE_PROVIDER_SITE_OTHER): Payer: Self-pay | Admitting: Dermatology

## 2021-10-24 DIAGNOSIS — L988 Other specified disorders of the skin and subcutaneous tissue: Secondary | ICD-10-CM

## 2021-10-24 NOTE — Progress Notes (Unsigned)
   Follow-Up Visit   Subjective  Lisa Dennis is a 56 y.o. female who presents for the following: Facial Elastosis (Patient would like botox touched up at upper lip. ).  The following portions of the chart were reviewed this encounter and updated as appropriate:  Tobacco  Allergies  Meds  Problems  Med Hx  Surg Hx  Fam Hx      Review of Systems: No other skin or systemic complaints except as noted in HPI or Assessment and Plan.   Objective  Well appearing patient in no apparent distress; mood and affect are within normal limits.  A focused examination was performed including upper lip and face. Relevant physical exam findings are noted in the Assessment and Plan.  face Rhytides and volume loss.    Assessment & Plan  Elastosis of skin face  Discussed adding an additional 2 units to upper lip today  1 unit added to left upper lip, 1 unit added to right upper lip Recommended dosing for next visit (including lip flip plus additional 2 units to address perioral rhytides) added to media tab    Botox Injection - face Location: See attached image  Informed consent: Discussed risks (infection, pain, bleeding, bruising, swelling, allergic reaction, paralysis of nearby muscles, eyelid droop, double vision, neck weakness, difficulty breathing, headache, undesirable cosmetic result, and need for additional treatment) and benefits of the procedure, as well as the alternatives.  Informed consent was obtained.  Preparation: The area was cleansed with alcohol.  Procedure Details:  Botox was injected into the dermis with a 30-gauge needle. Pressure applied to any bleeding. Ice packs offered for swelling.  Lot Number:  [id number] Expiration:  11/2023  Total Units Injected:  2  Plan: Patient was instructed to remain upright for 4 hours. Patient was instructed to avoid massaging the face and avoid vigorous exercise for the rest of the day. Tylenol may be used for headache.   Allow 2 weeks before returning to clinic for additional dosing as needed. Patient will call for any problems.    Return for follow up as scheduled . I, Ruthell Rummage, CMA, am acting as scribe for Forest Gleason, MD.  Documentation: I have reviewed the above documentation for accuracy and completeness, and I agree with the above.  Forest Gleason, MD

## 2021-10-24 NOTE — Patient Instructions (Signed)
Due to recent changes in healthcare laws, you may see results of your pathology and/or laboratory studies on MyChart before the doctors have had a chance to review them. We understand that in some cases there may be results that are confusing or concerning to you. Please understand that not all results are received at the same time and often the doctors may need to interpret multiple results in order to provide you with the best plan of care or course of treatment. Therefore, we ask that you please give us 2 business days to thoroughly review all your results before contacting the office for clarification. Should we see a critical lab result, you will be contacted sooner.   If You Need Anything After Your Visit  If you have any questions or concerns for your doctor, please call our main line at [phone] and press option 4 to reach your doctor's medical assistant. If no one answers, please leave a voicemail as directed and we will return your call as soon as possible. Messages left after 4 pm will be answered the following business day.   You may also send us a message via MyChart. We typically respond to MyChart messages within 1-2 business days.  For prescription refills, please ask your pharmacy to contact our office. Our fax number is [phone].  If you have an urgent issue when the clinic is closed that cannot wait until the next business day, you can page your doctor at the number below.    Please note that while we do our best to be available for urgent issues outside of office hours, we are not available 24/7.   If you have an urgent issue and are unable to reach us, you may choose to seek medical care at your doctor's office, retail clinic, urgent care center, or emergency room.  If you have a medical emergency, please immediately call 911 or go to the emergency department.  Pager Numbers  - Dr. Kowalski: [phone]  - Dr. Moye: [phone]  - Dr. Stewart:  [phone]  In the event of inclement weather, please call our main line at [phone] for an update on the status of any delays or closures.  Dermatology Medication Tips: Please keep the boxes that topical medications come in in order to help keep track of the instructions about where and how to use these. Pharmacies typically print the medication instructions only on the boxes and not directly on the medication tubes.   If your medication is too expensive, please contact our office at [phone] option 4 or send us a message through MyChart.   We are unable to tell what your co-pay for medications will be in advance as this is different depending on your insurance coverage. However, we may be able to find a substitute medication at lower cost or fill out paperwork to get insurance to cover a needed medication.   If a prior authorization is required to get your medication covered by your insurance company, please allow us 1-2 business days to complete this process.  Drug prices often vary depending on where the prescription is filled and some pharmacies may offer cheaper prices.  The website www.goodrx.com contains coupons for medications through different pharmacies. The prices here do not account for what the cost may be with help from insurance (it may be cheaper with your insurance), but the website can give you the price if you did not use any insurance.  - You can print the associated coupon and take it with   your prescription to the pharmacy.  - You may also stop by our office during regular business hours and pick up a GoodRx coupon card.  - If you need your prescription sent electronically to a different pharmacy, notify our office through [facility] MyChart or by phone at [phone] option 4.     Si Usted Necesita Algo Despus de Su Visita  Tambin puede enviarnos un mensaje a travs de MyChart. Por lo general respondemos a los mensajes de MyChart en el transcurso de 1 a 2  das hbiles.  Para renovar recetas, por favor pida a su farmacia que se ponga en contacto con nuestra oficina. Nuestro nmero de fax es el [phone].  Si tiene un asunto urgente cuando la clnica est cerrada y que no puede esperar hasta el siguiente da hbil, puede llamar/localizar a su doctor(a) al nmero que aparece a continuacin.   Por favor, tenga en cuenta que aunque hacemos todo lo posible para estar disponibles para asuntos urgentes fuera del horario de oficina, no estamos disponibles las 24 horas del da, los 7 das de la semana.   Si tiene un problema urgente y no puede comunicarse con nosotros, puede optar por buscar atencin mdica  en el consultorio de su doctor(a), en una clnica privada, en un centro de atencin urgente o en una sala de emergencias.  Si tiene una emergencia mdica, por favor llame inmediatamente al 911 o vaya a la sala de emergencias.  Nmeros de bper  - Dr. Kowalski: [phone]  - Dra. Moye: [phone]  - Dra. Stewart: [phone]  En caso de inclemencias del tiempo, por favor llame a nuestra lnea principal al [phone] para una actualizacin sobre el estado de cualquier retraso o cierre.  Consejos para la medicacin en dermatologa: Por favor, guarde las cajas en las que vienen los medicamentos de uso tpico para ayudarle a seguir las instrucciones sobre dnde y cmo usarlos. Las farmacias generalmente imprimen las instrucciones del medicamento slo en las cajas y no directamente en los tubos del medicamento.   Si su medicamento es muy caro, por favor, pngase en contacto con nuestra oficina llamando al [phone] y presione la opcin 4 o envenos un mensaje a travs de MyChart.   No podemos decirle cul ser su copago por los medicamentos por adelantado ya que esto es diferente dependiendo de la cobertura de su seguro. Sin embargo, es posible que podamos encontrar un medicamento sustituto a menor costo o llenar un formulario para que el  seguro cubra el medicamento que se considera necesario.   Si se requiere una autorizacin previa para que su compaa de seguros cubra su medicamento, por favor permtanos de 1 a 2 das hbiles para completar este proceso.  Los precios de los medicamentos varan con frecuencia dependiendo del lugar de dnde se surte la receta y alguna farmacias pueden ofrecer precios ms baratos.  El sitio web www.goodrx.com tiene cupones para medicamentos de diferentes farmacias. Los precios aqu no tienen en cuenta lo que podra costar con la ayuda del seguro (puede ser ms barato con su seguro), pero el sitio web puede darle el precio si no utiliz ningn seguro.  - Puede imprimir el cupn correspondiente y llevarlo con su receta a la farmacia.  - Tambin puede pasar por nuestra oficina durante el horario de atencin regular y recoger una tarjeta de cupones de GoodRx.  - Si necesita que su receta se enve electrnicamente a una farmacia diferente, informe a nuestra oficina a travs de MyChart de [place]   o por telfono llamando al [phone] y presione la opcin 4.  

## 2021-10-25 ENCOUNTER — Encounter: Payer: Self-pay | Admitting: Dermatology

## 2021-11-22 ENCOUNTER — Ambulatory Visit: Payer: BC Managed Care – PPO | Admitting: Dermatology

## 2021-12-06 ENCOUNTER — Ambulatory Visit (INDEPENDENT_AMBULATORY_CARE_PROVIDER_SITE_OTHER): Payer: BC Managed Care – PPO | Admitting: Dermatology

## 2021-12-06 ENCOUNTER — Encounter: Payer: Self-pay | Admitting: Dermatology

## 2021-12-06 DIAGNOSIS — L650 Telogen effluvium: Secondary | ICD-10-CM | POA: Diagnosis not present

## 2021-12-06 DIAGNOSIS — Z1283 Encounter for screening for malignant neoplasm of skin: Secondary | ICD-10-CM

## 2021-12-06 DIAGNOSIS — Z86018 Personal history of other benign neoplasm: Secondary | ICD-10-CM

## 2021-12-06 DIAGNOSIS — L578 Other skin changes due to chronic exposure to nonionizing radiation: Secondary | ICD-10-CM

## 2021-12-06 DIAGNOSIS — D229 Melanocytic nevi, unspecified: Secondary | ICD-10-CM

## 2021-12-06 DIAGNOSIS — L821 Other seborrheic keratosis: Secondary | ICD-10-CM

## 2021-12-06 DIAGNOSIS — L814 Other melanin hyperpigmentation: Secondary | ICD-10-CM

## 2021-12-06 DIAGNOSIS — D1800 Hemangioma unspecified site: Secondary | ICD-10-CM

## 2021-12-06 NOTE — Progress Notes (Unsigned)
Follow-Up Visit   Subjective  Lisa Dennis is a 56 y.o. female who presents for the following: Annual Exam (The patient presents for Total-Body Skin Exam (TBSE) for skin cancer screening and mole check.  The patient has spots, moles and lesions to be evaluated, some may be new or changing and the patient has concerns that these could be cancer. Patient with hx of dysplastic nevus. ).  Patient has noticed some freckles at abdomen near excision site and advises they were not there prior to surgery.   The following portions of the chart were reviewed this encounter and updated as appropriate:   Tobacco  Allergies  Meds  Problems  Med Hx  Surg Hx  Fam Hx      Review of Systems:  No other skin or systemic complaints except as noted in HPI or Assessment and Plan.  Objective  Well appearing patient in no apparent distress; mood and affect are within normal limits.  A full examination was performed including scalp, head, eyes, ears, nose, lips, neck, chest, axillae, abdomen, back, buttocks, bilateral upper extremities, bilateral lower extremities, hands, feet, fingers, toes, fingernails, and toenails. All findings within normal limits unless otherwise noted below.  Scalp Diffuse thinning with diffuse regrowth    Assessment & Plan  Telogen effluvium Scalp  Recommend minoxidil 5% (Rogaine for men) solution or foam to be applied to the scalp and left in. This should ideally be used twice daily for best results but it helps with hair regrowth when used at least three times per week. Rogaine initially can cause increased hair shedding for the first few weeks but this will stop with continued use. In studies, people who used minoxidil (Rogaine) for at least 6 months had thicker hair than people who did not. Minoxidil topical (Rogaine) only works as long as it continues to be used. If if it is no longer used then the hair it has been helping to regrow can fall out. Minoxidil topical  (Rogaine) can cause increased facial hair growth.    History of Dysplastic Nevi - No evidence of recurrence today - Recommend regular full body skin exams - Recommend daily broad spectrum sunscreen SPF 30+ to sun-exposed areas, reapply every 2 hours as needed.  - Call if any new or changing lesions are noted between office visits  Lentigines - Scattered tan macules - Due to sun exposure - Benign-appearing, observe - Recommend daily broad spectrum sunscreen SPF 30+ to sun-exposed areas, reapply every 2 hours as needed. - Call for any changes  Seborrheic Keratoses - Stuck-on, waxy, tan-brown papules and/or plaques  - Benign-appearing - Discussed benign etiology and prognosis. - Observe - Call for any changes  Melanocytic Nevi - Tan-brown and/or pink-flesh-colored symmetric macules and papules - Benign appearing on exam today - Observation - Call clinic for new or changing moles - Recommend daily use of broad spectrum spf 30+ sunscreen to sun-exposed areas.   Hemangiomas - Red papules - Discussed benign nature - Observe - Call for any changes  Actinic Damage - Chronic condition, secondary to cumulative UV/sun exposure - diffuse scaly erythematous macules with underlying dyspigmentation - Recommend daily broad spectrum sunscreen SPF 30+ to sun-exposed areas, reapply every 2 hours as needed.  - Staying in the shade or wearing long sleeves, sun glasses (UVA+UVB protection) and wide brim hats (4-inch brim around the entire circumference of the hat) are also recommended for sun protection.  - Call for new or changing lesions.  Skin cancer screening  performed today.  Return for Botox, as scheduled, TBSE in 1 year.  Graciella Belton, RMA, am acting as scribe for Forest Gleason, MD .

## 2021-12-06 NOTE — Patient Instructions (Signed)
Melanoma ABCDEs  Melanoma is the most dangerous type of skin cancer, and is the leading cause of death from skin disease.  You are more likely to develop melanoma if you: Have light-colored skin, light-colored eyes, or red or blond hair Spend a lot of time in the sun Tan regularly, either outdoors or in a tanning bed Have had blistering sunburns, especially during childhood Have a close family member who has had a melanoma Have atypical moles or large birthmarks  Early detection of melanoma is key since treatment is typically straightforward and cure rates are extremely high if we catch it early.   The first sign of melanoma is often a change in a mole or a new dark spot.  The ABCDE system is a way of remembering the signs of melanoma.  A for asymmetry:  The two halves do not match. B for border:  The edges of the growth are irregular. C for color:  A mixture of colors are present instead of an even brown color. D for diameter:  Melanomas are usually (but not always) greater than 6mm - the size of a pencil eraser. E for evolution:  The spot keeps changing in size, shape, and color.  Please check your skin once per month between visits. You can use a small mirror in front and a large mirror behind you to keep an eye on the back side or your body.   If you see any new or changing lesions before your next follow-up, please call to schedule a visit.  Please continue daily skin protection including broad spectrum sunscreen SPF 30+ to sun-exposed areas, reapplying every 2 hours as needed when you're outdoors.    Due to recent changes in healthcare laws, you may see results of your pathology and/or laboratory studies on MyChart before the doctors have had a chance to review them. We understand that in some cases there may be results that are confusing or concerning to you. Please understand that not all results are received at the same time and often the doctors may need to interpret multiple  results in order to provide you with the best plan of care or course of treatment. Therefore, we ask that you please give us 2 business days to thoroughly review all your results before contacting the office for clarification. Should we see a critical lab result, you will be contacted sooner.   If You Need Anything After Your Visit  If you have any questions or concerns for your doctor, please call our main line at [phone] and press option 4 to reach your doctor's medical assistant. If no one answers, please leave a voicemail as directed and we will return your call as soon as possible. Messages left after 4 pm will be answered the following business day.   You may also send us a message via MyChart. We typically respond to MyChart messages within 1-2 business days.  For prescription refills, please ask your pharmacy to contact our office. Our fax number is [phone].  If you have an urgent issue when the clinic is closed that cannot wait until the next business day, you can page your doctor at the number below.    Please note that while we do our best to be available for urgent issues outside of office hours, we are not available 24/7.   If you have an urgent issue and are unable to reach us, you may choose to seek medical care at your doctor's office, retail clinic, urgent care   center, or emergency room.  If you have a medical emergency, please immediately call 911 or go to the emergency department.  Pager Numbers  - Dr. Kowalski: [phone]  - Dr. Moye: [phone]  - Dr. Stewart: [phone]  In the event of inclement weather, please call our main line at [phone] for an update on the status of any delays or closures.  Dermatology Medication Tips: Please keep the boxes that topical medications come in in order to help keep track of the instructions about where and how to use these. Pharmacies typically print the medication instructions only on the boxes and not  directly on the medication tubes.   If your medication is too expensive, please contact our office at [phone] option 4 or send us a message through MyChart.   We are unable to tell what your co-pay for medications will be in advance as this is different depending on your insurance coverage. However, we may be able to find a substitute medication at lower cost or fill out paperwork to get insurance to cover a needed medication.   If a prior authorization is required to get your medication covered by your insurance company, please allow us 1-2 business days to complete this process.  Drug prices often vary depending on where the prescription is filled and some pharmacies may offer cheaper prices.  The website www.goodrx.com contains coupons for medications through different pharmacies. The prices here do not account for what the cost may be with help from insurance (it may be cheaper with your insurance), but the website can give you the price if you did not use any insurance.  - You can print the associated coupon and take it with your prescription to the pharmacy.  - You may also stop by our office during regular business hours and pick up a GoodRx coupon card.  - If you need your prescription sent electronically to a different pharmacy, notify our office through [facility] MyChart or by phone at [phone] option 4.     Si Usted Necesita Algo Despus de Su Visita  Tambin puede enviarnos un mensaje a travs de MyChart. Por lo general respondemos a los mensajes de MyChart en el transcurso de 1 a 2 das hbiles.  Para renovar recetas, por favor pida a su farmacia que se ponga en contacto con nuestra oficina. Nuestro nmero de fax es el [phone].  Si tiene un asunto urgente cuando la clnica est cerrada y que no puede esperar hasta el siguiente da hbil, puede llamar/localizar a su doctor(a) al nmero que aparece a continuacin.   Por favor, tenga en cuenta que aunque hacemos  todo lo posible para estar disponibles para asuntos urgentes fuera del horario de oficina, no estamos disponibles las 24 horas del da, los 7 das de la semana.   Si tiene un problema urgente y no puede comunicarse con nosotros, puede optar por buscar atencin mdica  en el consultorio de su doctor(a), en una clnica privada, en un centro de atencin urgente o en una sala de emergencias.  Si tiene una emergencia mdica, por favor llame inmediatamente al 911 o vaya a la sala de emergencias.  Nmeros de bper  - Dr. Kowalski: [phone]  - Dra. Moye: [phone]  - Dra. Stewart: [phone]  En caso de inclemencias del tiempo, por favor llame a nuestra lnea principal al [phone] para una actualizacin sobre el estado de cualquier retraso o cierre.  Consejos para la medicacin en dermatologa: Por favor, guarde las cajas en las   que vienen los medicamentos de uso tpico para ayudarle a seguir las instrucciones sobre dnde y cmo usarlos. Las farmacias generalmente imprimen las instrucciones del medicamento slo en las cajas y no directamente en los tubos del medicamento.   Si su medicamento es muy caro, por favor, pngase en contacto con nuestra oficina llamando al [phone] y presione la opcin 4 o envenos un mensaje a travs de MyChart.   No podemos decirle cul ser su copago por los medicamentos por adelantado ya que esto es diferente dependiendo de la cobertura de su seguro. Sin embargo, es posible que podamos encontrar un medicamento sustituto a menor costo o llenar un formulario para que el seguro cubra el medicamento que se considera necesario.   Si se requiere una autorizacin previa para que su compaa de seguros cubra su medicamento, por favor permtanos de 1 a 2 das hbiles para completar este proceso.  Los precios de los medicamentos varan con frecuencia dependiendo del lugar de dnde se surte la receta y alguna farmacias pueden ofrecer precios ms baratos.  El  sitio web www.goodrx.com tiene cupones para medicamentos de diferentes farmacias. Los precios aqu no tienen en cuenta lo que podra costar con la ayuda del seguro (puede ser ms barato con su seguro), pero el sitio web puede darle el precio si no utiliz ningn seguro.  - Puede imprimir el cupn correspondiente y llevarlo con su receta a la farmacia.  - Tambin puede pasar por nuestra oficina durante el horario de atencin regular y recoger una tarjeta de cupones de GoodRx.  - Si necesita que su receta se enve electrnicamente a una farmacia diferente, informe a nuestra oficina a travs de MyChart de [facility] o por telfono llamando al [phone] y presione la opcin 4.  

## 2022-01-17 ENCOUNTER — Ambulatory Visit: Payer: BC Managed Care – PPO | Admitting: Dermatology

## 2022-01-30 ENCOUNTER — Ambulatory Visit (INDEPENDENT_AMBULATORY_CARE_PROVIDER_SITE_OTHER): Payer: Self-pay | Admitting: Dermatology

## 2022-01-30 DIAGNOSIS — L988 Other specified disorders of the skin and subcutaneous tissue: Secondary | ICD-10-CM

## 2022-01-30 NOTE — Patient Instructions (Signed)
Due to recent changes in healthcare laws, you may see results of your pathology and/or laboratory studies on MyChart before the doctors have had a chance to review them. We understand that in some cases there may be results that are confusing or concerning to you. Please understand that not all results are received at the same time and often the doctors may need to interpret multiple results in order to provide you with the best plan of care or course of treatment. Therefore, we ask that you please give us 2 business days to thoroughly review all your results before contacting the office for clarification. Should we see a critical lab result, you will be contacted sooner.   If You Need Anything After Your Visit  If you have any questions or concerns for your doctor, please call our main line at [phone] and press option 4 to reach your doctor's medical assistant. If no one answers, please leave a voicemail as directed and we will return your call as soon as possible. Messages left after 4 pm will be answered the following business day.   You may also send us a message via MyChart. We typically respond to MyChart messages within 1-2 business days.  For prescription refills, please ask your pharmacy to contact our office. Our fax number is [phone].  If you have an urgent issue when the clinic is closed that cannot wait until the next business day, you can page your doctor at the number below.    Please note that while we do our best to be available for urgent issues outside of office hours, we are not available 24/7.   If you have an urgent issue and are unable to reach us, you may choose to seek medical care at your doctor's office, retail clinic, urgent care center, or emergency room.  If you have a medical emergency, please immediately call 911 or go to the emergency department.  Pager Numbers  - Dr. Kowalski: [phone]  - Dr. Moye: [phone]  - Dr. Stewart:  [phone]  In the event of inclement weather, please call our main line at [phone] for an update on the status of any delays or closures.  Dermatology Medication Tips: Please keep the boxes that topical medications come in in order to help keep track of the instructions about where and how to use these. Pharmacies typically print the medication instructions only on the boxes and not directly on the medication tubes.   If your medication is too expensive, please contact our office at [phone] option 4 or send us a message through MyChart.   We are unable to tell what your co-pay for medications will be in advance as this is different depending on your insurance coverage. However, we may be able to find a substitute medication at lower cost or fill out paperwork to get insurance to cover a needed medication.   If a prior authorization is required to get your medication covered by your insurance company, please allow us 1-2 business days to complete this process.  Drug prices often vary depending on where the prescription is filled and some pharmacies may offer cheaper prices.  The website www.goodrx.com contains coupons for medications through different pharmacies. The prices here do not account for what the cost may be with help from insurance (it may be cheaper with your insurance), but the website can give you the price if you did not use any insurance.  - You can print the associated coupon and take it with   your prescription to the pharmacy.  - You may also stop by our office during regular business hours and pick up a GoodRx coupon card.  - If you need your prescription sent electronically to a different pharmacy, notify our office through [facility] MyChart or by phone at [phone] option 4.     Si Usted Necesita Algo Despus de Su Visita  Tambin puede enviarnos un mensaje a travs de MyChart. Por lo general respondemos a los mensajes de MyChart en el transcurso de 1 a 2  das hbiles.  Para renovar recetas, por favor pida a su farmacia que se ponga en contacto con nuestra oficina. Nuestro nmero de fax es el [phone].  Si tiene un asunto urgente cuando la clnica est cerrada y que no puede esperar hasta el siguiente da hbil, puede llamar/localizar a su doctor(a) al nmero que aparece a continuacin.   Por favor, tenga en cuenta que aunque hacemos todo lo posible para estar disponibles para asuntos urgentes fuera del horario de oficina, no estamos disponibles las 24 horas del da, los 7 das de la semana.   Si tiene un problema urgente y no puede comunicarse con nosotros, puede optar por buscar atencin mdica  en el consultorio de su doctor(a), en una clnica privada, en un centro de atencin urgente o en una sala de emergencias.  Si tiene una emergencia mdica, por favor llame inmediatamente al 911 o vaya a la sala de emergencias.  Nmeros de bper  - Dr. Kowalski: [phone]  - Dra. Moye: [phone]  - Dra. Stewart: [phone]  En caso de inclemencias del tiempo, por favor llame a nuestra lnea principal al [phone] para una actualizacin sobre el estado de cualquier retraso o cierre.  Consejos para la medicacin en dermatologa: Por favor, guarde las cajas en las que vienen los medicamentos de uso tpico para ayudarle a seguir las instrucciones sobre dnde y cmo usarlos. Las farmacias generalmente imprimen las instrucciones del medicamento slo en las cajas y no directamente en los tubos del medicamento.   Si su medicamento es muy caro, por favor, pngase en contacto con nuestra oficina llamando al [phone] y presione la opcin 4 o envenos un mensaje a travs de MyChart.   No podemos decirle cul ser su copago por los medicamentos por adelantado ya que esto es diferente dependiendo de la cobertura de su seguro. Sin embargo, es posible que podamos encontrar un medicamento sustituto a menor costo o llenar un formulario para que el  seguro cubra el medicamento que se considera necesario.   Si se requiere una autorizacin previa para que su compaa de seguros cubra su medicamento, por favor permtanos de 1 a 2 das hbiles para completar este proceso.  Los precios de los medicamentos varan con frecuencia dependiendo del lugar de dnde se surte la receta y alguna farmacias pueden ofrecer precios ms baratos.  El sitio web www.goodrx.com tiene cupones para medicamentos de diferentes farmacias. Los precios aqu no tienen en cuenta lo que podra costar con la ayuda del seguro (puede ser ms barato con su seguro), pero el sitio web puede darle el precio si no utiliz ningn seguro.  - Puede imprimir el cupn correspondiente y llevarlo con su receta a la farmacia.  - Tambin puede pasar por nuestra oficina durante el horario de atencin regular y recoger una tarjeta de cupones de GoodRx.  - Si necesita que su receta se enve electrnicamente a una farmacia diferente, informe a nuestra oficina a travs de MyChart de [facility]   o por telfono llamando al [phone] y presione la opcin 4.  

## 2022-01-30 NOTE — Progress Notes (Signed)
   Follow-Up Visit   Subjective  Lisa Dennis is a 56 y.o. female who presents for the following: Facial Elastosis (3 month botox ).   The following portions of the chart were reviewed this encounter and updated as appropriate:  Tobacco  Allergies  Meds  Problems  Med Hx  Surg Hx  Fam Hx      Review of Systems: No other skin or systemic complaints except as noted in HPI or Assessment and Plan.   Objective  Well appearing patient in no apparent distress; mood and affect are within normal limits.  A focused examination was performed including face . Relevant physical exam findings are noted in the Assessment and Plan.  face Rhytides and volume loss.       Assessment & Plan  Elastosis of skin face  42 units as marked   Botox Injection - face Location: See attached image  Informed consent: Discussed risks (infection, pain, bleeding, bruising, swelling, allergic reaction, paralysis of nearby muscles, eyelid droop, double vision, neck weakness, difficulty breathing, headache, undesirable cosmetic result, and need for additional treatment) and benefits of the procedure, as well as the alternatives.  Informed consent was obtained.  Preparation: The area was cleansed with alcohol.  Procedure Details:  Botox was injected into the dermis with a 30-gauge needle. Pressure applied to any bleeding. Ice packs offered for swelling.  Lot Number:  [id number] Expiration:  04/2024  Total Units Injected:  42  Plan: Tylenol may be used for headache.  Allow 2 weeks before returning to clinic for additional dosing as needed. Patient will call for any problems.    Return for 3 month botox .  I, Ruthell Rummage, CMA, am acting as scribe for Forest Gleason, MD.  Documentation: I have reviewed the above documentation for accuracy and completeness, and I agree with the above.  Forest Gleason, MD

## 2022-02-02 ENCOUNTER — Encounter: Payer: Self-pay | Admitting: Dermatology

## 2022-05-15 ENCOUNTER — Ambulatory Visit (INDEPENDENT_AMBULATORY_CARE_PROVIDER_SITE_OTHER): Payer: Self-pay | Admitting: Dermatology

## 2022-05-15 VITALS — BP 102/67 | HR 81

## 2022-05-15 DIAGNOSIS — L988 Other specified disorders of the skin and subcutaneous tissue: Secondary | ICD-10-CM

## 2022-05-15 NOTE — Patient Instructions (Addendum)
Due to recent changes in healthcare laws, you may see results of your pathology and/or laboratory studies on MyChart before the doctors have had a chance to review them. We understand that in some cases there may be results that are confusing or concerning to you. Please understand that not all results are received at the same time and often the doctors may need to interpret multiple results in order to provide you with the best plan of care or course of treatment. Therefore, we ask that you please give us 2 business days to thoroughly review all your results before contacting the office for clarification. Should we see a critical lab result, you will be contacted sooner.   If You Need Anything After Your Visit  If you have any questions or concerns for your doctor, please call our main line at [phone] and press option 4 to reach your doctor's medical assistant. If no one answers, please leave a voicemail as directed and we will return your call as soon as possible. Messages left after 4 pm will be answered the following business day.   You may also send us a message via MyChart. We typically respond to MyChart messages within 1-2 business days.  For prescription refills, please ask your pharmacy to contact our office. Our fax number is [phone].  If you have an urgent issue when the clinic is closed that cannot wait until the next business day, you can page your doctor at the number below.    Please note that while we do our best to be available for urgent issues outside of office hours, we are not available 24/7.   If you have an urgent issue and are unable to reach us, you may choose to seek medical care at your doctor's office, retail clinic, urgent care center, or emergency room.  If you have a medical emergency, please immediately call 911 or go to the emergency department.  Pager Numbers  - Dr. Kowalski: [phone]  - Dr. Moye: [phone]  - Dr. Stewart:  [phone]  In the event of inclement weather, please call our main line at [phone] for an update on the status of any delays or closures.  Dermatology Medication Tips: Please keep the boxes that topical medications come in in order to help keep track of the instructions about where and how to use these. Pharmacies typically print the medication instructions only on the boxes and not directly on the medication tubes.   If your medication is too expensive, please contact our office at [phone] option 4 or send us a message through MyChart.   We are unable to tell what your co-pay for medications will be in advance as this is different depending on your insurance coverage. However, we may be able to find a substitute medication at lower cost or fill out paperwork to get insurance to cover a needed medication.   If a prior authorization is required to get your medication covered by your insurance company, please allow us 1-2 business days to complete this process.  Drug prices often vary depending on where the prescription is filled and some pharmacies may offer cheaper prices.  The website www.goodrx.com contains coupons for medications through different pharmacies. The prices here do not account for what the cost may be with help from insurance (it may be cheaper with your insurance), but the website can give you the price if you did not use any insurance.  - You can print the associated coupon and take it with   your prescription to the pharmacy.  - You may also stop by our office during regular business hours and pick up a GoodRx coupon card.  - If you need your prescription sent electronically to a different pharmacy, notify our office through [facility] MyChart or by phone at [phone] option 4.     Si Usted Necesita Algo Despus de Su Visita  Tambin puede enviarnos un mensaje a travs de MyChart. Por lo general respondemos a los mensajes de MyChart en el transcurso de 1 a 2  das hbiles.  Para renovar recetas, por favor pida a su farmacia que se ponga en contacto con nuestra oficina. Nuestro nmero de fax es el [phone].  Si tiene un asunto urgente cuando la clnica est cerrada y que no puede esperar hasta el siguiente da hbil, puede llamar/localizar a su doctor(a) al nmero que aparece a continuacin.   Por favor, tenga en cuenta que aunque hacemos todo lo posible para estar disponibles para asuntos urgentes fuera del horario de oficina, no estamos disponibles las 24 horas del da, los 7 das de la semana.   Si tiene un problema urgente y no puede comunicarse con nosotros, puede optar por buscar atencin mdica  en el consultorio de su doctor(a), en una clnica privada, en un centro de atencin urgente o en una sala de emergencias.  Si tiene una emergencia mdica, por favor llame inmediatamente al 911 o vaya a la sala de emergencias.  Nmeros de bper  - Dr. Kowalski: [phone]  - Dra. Moye: [phone]  - Dra. Stewart: [phone]  En caso de inclemencias del tiempo, por favor llame a nuestra lnea principal al [phone] para una actualizacin sobre el estado de cualquier retraso o cierre.  Consejos para la medicacin en dermatologa: Por favor, guarde las cajas en las que vienen los medicamentos de uso tpico para ayudarle a seguir las instrucciones sobre dnde y cmo usarlos. Las farmacias generalmente imprimen las instrucciones del medicamento slo en las cajas y no directamente en los tubos del medicamento.   Si su medicamento es muy caro, por favor, pngase en contacto con nuestra oficina llamando al [phone] y presione la opcin 4 o envenos un mensaje a travs de MyChart.   No podemos decirle cul ser su copago por los medicamentos por adelantado ya que esto es diferente dependiendo de la cobertura de su seguro. Sin embargo, es posible que podamos encontrar un medicamento sustituto a menor costo o llenar un formulario para que el  seguro cubra el medicamento que se considera necesario.   Si se requiere una autorizacin previa para que su compaa de seguros cubra su medicamento, por favor permtanos de 1 a 2 das hbiles para completar este proceso.  Los precios de los medicamentos varan con frecuencia dependiendo del lugar de dnde se surte la receta y alguna farmacias pueden ofrecer precios ms baratos.  El sitio web www.goodrx.com tiene cupones para medicamentos de diferentes farmacias. Los precios aqu no tienen en cuenta lo que podra costar con la ayuda del seguro (puede ser ms barato con su seguro), pero el sitio web puede darle el precio si no utiliz ningn seguro.  - Puede imprimir el cupn correspondiente y llevarlo con su receta a la farmacia.  - Tambin puede pasar por nuestra oficina durante el horario de atencin regular y recoger una tarjeta de cupones de GoodRx.  - Si necesita que su receta se enve electrnicamente a una farmacia diferente, informe a nuestra oficina a travs de MyChart de [place] o por telfono llamando al [phone] y presione la opcin 4.      Due to recent changes in healthcare laws, you may see results of your pathology and/or laboratory studies on MyChart before the doctors have had a chance to review them. We understand that in some cases there may be results that are confusing or concerning to you. Please understand that not all results are received at the same time and often the doctors may need to interpret multiple results in order to provide you with the best plan of care or course of treatment. Therefore, we ask that you please give [place] 2 business days to thoroughly review all your results before contacting the office for clarification. Should we see a critical lab result, you will be contacted sooner.   If You Need Anything After Your Visit  If you have any questions or concerns for your doctor, please call our main line at [phone] and press option 4 to reach your  doctor's medical assistant. If no one answers, please leave a voicemail as directed and we will return your call as soon as possible. Messages left after 4 pm will be answered the following business day.   You may also send [place] a message via [place]. We typically respond to MyChart messages within 1-2 business days.  For prescription refills, please ask your pharmacy to contact our office. Our fax number is [phone].  If you have an urgent issue when the clinic is closed that cannot wait until the next business day, you can page your doctor at the number below.    Please note that while we do our best to be available for urgent issues outside of office hours, we are not available 24/7.   If you have an urgent issue and are unable to reach [place], you may choose to seek medical care at your doctor's office, retail clinic, urgent care center, or emergency room.  If you have a medical emergency, please immediately call 911 or go to the emergency department.  Pager Numbers  - Dr. Nehemiah Massed: [phone]  - Dr. Laurence Ferrari: [phone]  - Dr. Nicole Kindred: [phone]  In the event of inclement weather, please call our main line at [phone] for an update on the status of any delays or closures.  Dermatology Medication Tips: Please keep the boxes that topical medications come in in order to help keep track of the instructions about where and how to use these. Pharmacies typically print the medication instructions only on the boxes and not directly on the medication tubes.   If your medication is too expensive, please contact our office at [phone] option 4 or send [place] a message through [place].   We are unable to tell what your co-pay for medications will be in advance as this is different depending on your insurance coverage. However, we may be able to find a substitute medication at lower cost or fill out paperwork to get insurance to cover a needed medication.   If a prior authorization is  required to get your medication covered by your insurance company, please allow [place] 1-2 business days to complete this process.  Drug prices often vary depending on where the prescription is filled and some pharmacies may offer cheaper prices.  The website www.goodrx.com contains coupons for medications through different pharmacies. The prices here do not account for what the cost may be with help from insurance (it may be cheaper with your insurance), but the website can give you  the price if you did not use any insurance.  - You can print the associated coupon and take it with your prescription to the pharmacy.  - You may also stop by our office during regular business hours and pick up a GoodRx coupon card.  - If you need your prescription sent electronically to a different pharmacy, notify our office through [facility] [place] or by phone at [phone] option 4.     Si Usted Necesita Algo Despus de Su Visita  Tambin puede enviarnos un mensaje a travs de Pharmacist, community. Por lo general respondemos a los mensajes de MyChart en el transcurso de 1 a 2 das hbiles.  Para renovar recetas, por favor pida a su farmacia que se ponga en contacto con nuestra oficina. Harland Dingwall de fax es [place] [phone].  Si tiene un asunto urgente cuando la clnica est cerrada y que no puede esperar hasta el siguiente da hbil, puede llamar/localizar a su doctor(a) al nmero que aparece a continuacin.   Por favor, tenga en cuenta que aunque hacemos todo lo posible para estar disponibles para asuntos urgentes fuera del horario de [place], no estamos disponibles las 24 horas del da, los 7 das de la [place].   Si tiene un problema urgente y no puede comunicarse con nosotros, puede optar por buscar atencin mdica  en el consultorio de su doctor(a), en una clnica privada, en un centro de atencin urgente o en una sala de emergencias.  Si tiene Engineering geologist, por favor llame inmediatamente al 911 o vaya a  la sala de emergencias.  Nmeros de bper  - Dr. Nehemiah Massed: [phone]  - Dra. Moye: [phone]  - Dra. Nicole Kindred: [phone]  En caso de inclemencias del [place], por favor llame a Johnsie Kindred principal al [phone] para una actualizacin sobre el [place] de cualquier retraso o cierre.  Consejos para la medicacin en dermatologa: Por favor, guarde las cajas en las que vienen los medicamentos de uso tpico para ayudarle a seguir las instrucciones sobre dnde y cmo usarlos. Las farmacias generalmente imprimen las instrucciones del medicamento slo en las cajas y no directamente en los tubos del [place].   Si su medicamento es muy caro, por favor, pngase en contacto con Zigmund Daniel llamando al [phone] y presione la opcin 4 o envenos un mensaje a travs de Pharmacist, community.   No podemos decirle cul ser su copago por los medicamentos por adelantado ya que esto es diferente dependiendo de la cobertura de su seguro. Sin embargo, es posible que podamos encontrar un medicamento sustituto a Electrical engineer un formulario para que el seguro cubra el medicamento que se considera necesario.   Si se requiere una autorizacin previa para que su compaa de seguros [place] su medicamento, por favor permtanos de 1 a 2 das hbiles para completar este proceso.  Los precios de los medicamentos varan con frecuencia dependiendo del Environmental consultant de dnde se surte la receta y alguna farmacias pueden ofrecer precios ms baratos.  El sitio web www.goodrx.com tiene cupones para medicamentos de Airline pilot. Los precios aqu no tienen en cuenta lo que podra costar con la ayuda del seguro (puede ser ms barato con su seguro), pero el sitio web puede darle el precio si no utiliz Research scientist (physical sciences).  - Puede imprimir el cupn correspondiente y llevarlo con su receta a la farmacia.  - Tambin puede pasar por nuestra oficina durante el horario de atencin regular y Charity fundraiser una tarjeta de cupones de  GoodRx.  - Si necesita  que su receta se enve electrnicamente a Chiropodist, informe a nuestra oficina a travs de MyChart de [place] o por telfono llamando al [phone] y presione la opcin 4.

## 2022-05-15 NOTE — Progress Notes (Signed)
   Follow-Up Visit   Subjective  Lisa Dennis is a 57 y.o. female who presents for the following: elastosis of skin (Patient is here for 3.5 month botox. ).  The following portions of the chart were reviewed this encounter and updated as appropriate:  Tobacco  Allergies  Meds  Problems  Med Hx  Surg Hx  Fam Hx      Review of Systems: No other skin or systemic complaints except as noted in HPI or Assessment and Plan.   Objective  Well appearing patient in no apparent distress; mood and affect are within normal limits.  A focused examination was performed including face. Relevant physical exam findings are noted in the Assessment and Plan.  Head - Anterior (Face) Rhytides and volume loss.       Assessment & Plan  Elastosis of skin Head - Anterior (Face)  42 units at face as marked  Botox Injection - Head - Anterior (Face) Location: See attached image  Informed consent: Discussed risks (infection, pain, bleeding, bruising, swelling, allergic reaction, paralysis of nearby muscles, eyelid droop, double vision, neck weakness, difficulty breathing, headache, undesirable cosmetic result, and need for additional treatment) and benefits of the procedure, as well as the alternatives.  Informed consent was obtained.  Preparation: The area was cleansed with alcohol.  Procedure Details:  Botox was injected into the dermis with a 30-gauge needle. Pressure applied to any bleeding. Ice packs offered for swelling.  Lot Number:  [id number] Expiration:  06/22/2024  Total Units Injected:  42  Plan: Tylenol may be used for headache.  Allow 2 weeks before returning to clinic for additional dosing as needed. Patient will call for any problems.    Return for 3.5 month botox .  I, Ruthell Rummage, CMA, am acting as scribe for Forest Gleason, MD.  Documentation: I have reviewed the above documentation for accuracy and completeness, and I agree with the above.  Forest Gleason,  MD

## 2022-05-17 ENCOUNTER — Encounter: Payer: Self-pay | Admitting: Dermatology

## 2022-06-26 ENCOUNTER — Encounter: Payer: Self-pay | Admitting: Dermatology

## 2022-06-27 NOTE — Telephone Encounter (Signed)
We can send it. Minoxidil 2.5 mg tablet, take 1/2 tablet daily. I would just have her confirm with her transplant team it is okay to take it before starting. Thank you!

## 2022-07-08 ENCOUNTER — Other Ambulatory Visit: Payer: Self-pay

## 2022-07-08 MED ORDER — MINOXIDIL 2.5 MG PO TABS: 2.5000 mg | ORAL_TABLET | Freq: Every day | ORAL | 1 refills | Status: DC

## 2022-08-01 ENCOUNTER — Ambulatory Visit (INDEPENDENT_AMBULATORY_CARE_PROVIDER_SITE_OTHER): Payer: Self-pay | Admitting: Dermatology

## 2022-08-01 ENCOUNTER — Encounter: Payer: Self-pay | Admitting: Dermatology

## 2022-08-01 VITALS — BP 106/68 | HR 106

## 2022-08-01 DIAGNOSIS — L988 Other specified disorders of the skin and subcutaneous tissue: Secondary | ICD-10-CM

## 2022-08-01 NOTE — Patient Instructions (Signed)
Due to recent changes in healthcare laws, you may see results of your pathology and/or laboratory studies on MyChart before the doctors have had a chance to review them. We understand that in some cases there may be results that are confusing or concerning to you. Please understand that not all results are received at the same time and often the doctors may need to interpret multiple results in order to provide you with the best plan of care or course of treatment. Therefore, we ask that you please give us 2 business days to thoroughly review all your results before contacting the office for clarification. Should we see a critical lab result, you will be contacted sooner.   If You Need Anything After Your Visit  If you have any questions or concerns for your doctor, please call our main line at [phone] and press option 4 to reach your doctor's medical assistant. If no one answers, please leave a voicemail as directed and we will return your call as soon as possible. Messages left after 4 pm will be answered the following business day.   You may also send us a message via MyChart. We typically respond to MyChart messages within 1-2 business days.  For prescription refills, please ask your pharmacy to contact our office. Our fax number is [phone].  If you have an urgent issue when the clinic is closed that cannot wait until the next business day, you can page your doctor at the number below.    Please note that while we do our best to be available for urgent issues outside of office hours, we are not available 24/7.   If you have an urgent issue and are unable to reach us, you may choose to seek medical care at your doctor's office, retail clinic, urgent care center, or emergency room.  If you have a medical emergency, please immediately call 911 or go to the emergency department.  Pager Numbers  - Dr. Kowalski: [phone]  - Dr. Moye: [phone]  - Dr. Stewart:  [phone]  In the event of inclement weather, please call our main line at [phone] for an update on the status of any delays or closures.  Dermatology Medication Tips: Please keep the boxes that topical medications come in in order to help keep track of the instructions about where and how to use these. Pharmacies typically print the medication instructions only on the boxes and not directly on the medication tubes.   If your medication is too expensive, please contact our office at [phone] option 4 or send us a message through MyChart.   We are unable to tell what your co-pay for medications will be in advance as this is different depending on your insurance coverage. However, we may be able to find a substitute medication at lower cost or fill out paperwork to get insurance to cover a needed medication.   If a prior authorization is required to get your medication covered by your insurance company, please allow us 1-2 business days to complete this process.  Drug prices often vary depending on where the prescription is filled and some pharmacies may offer cheaper prices.  The website www.goodrx.com contains coupons for medications through different pharmacies. The prices here do not account for what the cost may be with help from insurance (it may be cheaper with your insurance), but the website can give you the price if you did not use any insurance.  - You can print the associated coupon and take it with   your prescription to the pharmacy.  - You may also stop by our office during regular business hours and pick up a GoodRx coupon card.  - If you need your prescription sent electronically to a different pharmacy, notify our office through [facility] MyChart or by phone at [phone] option 4.     Si Usted Necesita Algo Despus de Su Visita  Tambin puede enviarnos un mensaje a travs de MyChart. Por lo general respondemos a los mensajes de MyChart en el transcurso de 1 a 2  das hbiles.  Para renovar recetas, por favor pida a su farmacia que se ponga en contacto con nuestra oficina. Nuestro nmero de fax es el [phone].  Si tiene un asunto urgente cuando la clnica est cerrada y que no puede esperar hasta el siguiente da hbil, puede llamar/localizar a su doctor(a) al nmero que aparece a continuacin.   Por favor, tenga en cuenta que aunque hacemos todo lo posible para estar disponibles para asuntos urgentes fuera del horario de oficina, no estamos disponibles las 24 horas del da, los 7 das de la semana.   Si tiene un problema urgente y no puede comunicarse con nosotros, puede optar por buscar atencin mdica  en el consultorio de su doctor(a), en una clnica privada, en un centro de atencin urgente o en una sala de emergencias.  Si tiene una emergencia mdica, por favor llame inmediatamente al 911 o vaya a la sala de emergencias.  Nmeros de bper  - Dr. Kowalski: [phone]  - Dra. Moye: [phone]  - Dra. Stewart: [phone]  En caso de inclemencias del tiempo, por favor llame a nuestra lnea principal al [phone] para una actualizacin sobre el estado de cualquier retraso o cierre.  Consejos para la medicacin en dermatologa: Por favor, guarde las cajas en las que vienen los medicamentos de uso tpico para ayudarle a seguir las instrucciones sobre dnde y cmo usarlos. Las farmacias generalmente imprimen las instrucciones del medicamento slo en las cajas y no directamente en los tubos del medicamento.   Si su medicamento es muy caro, por favor, pngase en contacto con nuestra oficina llamando al [phone] y presione la opcin 4 o envenos un mensaje a travs de MyChart.   No podemos decirle cul ser su copago por los medicamentos por adelantado ya que esto es diferente dependiendo de la cobertura de su seguro. Sin embargo, es posible que podamos encontrar un medicamento sustituto a menor costo o llenar un formulario para que el  seguro cubra el medicamento que se considera necesario.   Si se requiere una autorizacin previa para que su compaa de seguros cubra su medicamento, por favor permtanos de 1 a 2 das hbiles para completar este proceso.  Los precios de los medicamentos varan con frecuencia dependiendo del lugar de dnde se surte la receta y alguna farmacias pueden ofrecer precios ms baratos.  El sitio web www.goodrx.com tiene cupones para medicamentos de diferentes farmacias. Los precios aqu no tienen en cuenta lo que podra costar con la ayuda del seguro (puede ser ms barato con su seguro), pero el sitio web puede darle el precio si no utiliz ningn seguro.  - Puede imprimir el cupn correspondiente y llevarlo con su receta a la farmacia.  - Tambin puede pasar por nuestra oficina durante el horario de atencin regular y recoger una tarjeta de cupones de GoodRx.  - Si necesita que su receta se enve electrnicamente a una farmacia diferente, informe a nuestra oficina a travs de MyChart de    o por telfono llamando al [phone] y presione la opcin 4.  

## 2022-08-01 NOTE — Progress Notes (Signed)
   Follow-Up Visit   Subjective  Lisa Dennis is a 57 y.o. female who presents for the following: Botox for facial elastosis  The following portions of the chart were reviewed this encounter and updated as appropriate: medications, allergies, medical history  Review of Systems:  No other skin or systemic complaints except as noted in HPI or Assessment and Plan.  Objective  Well appearing patient in no apparent distress; mood and affect are within normal limits.  A focused examination was performed of the face.  Relevant physical exam findings are noted in the Assessment and Plan.      Assessment & Plan    Facial Elastosis  Location: See attached image  Informed consent: Discussed risks (infection, pain, bleeding, bruising, swelling, allergic reaction, paralysis of nearby muscles, eyelid droop, double vision, neck weakness, difficulty breathing, headache, undesirable cosmetic result, and need for additional treatment) and benefits of the procedure, as well as the alternatives.  Informed consent was obtained.  Preparation: The area was cleansed with alcohol.  Procedure Details:  Botox was injected into the dermis with a 30-gauge needle. Pressure applied to any bleeding. Ice packs offered for swelling.  Lot Number:  [id number] Expiration:  06/2024  Total Units Injected:  43  Plan: Tylenol may be used for headache.  Allow 2 weeks before returning to clinic for additional dosing as needed. Patient will call for any problems.  Return in about 3 months (around 11/01/2022) for Botox.  I, Lawson Radar, CMA, am acting as scribe for Darden Dates, MD.   Documentation: I have reviewed the above documentation for accuracy and completeness, and I agree with the above.  Darden Dates, MD

## 2022-08-07 ENCOUNTER — Telehealth: Payer: Self-pay | Admitting: Dermatology

## 2022-08-07 ENCOUNTER — Other Ambulatory Visit: Payer: Self-pay | Admitting: Dermatology

## 2022-08-07 DIAGNOSIS — B009 Herpesviral infection, unspecified: Secondary | ICD-10-CM

## 2022-08-07 NOTE — Telephone Encounter (Signed)
Can you confirm if [place] is taking Valgancyclovir? It looks like it was prescribed in 2022. Thank you!

## 2022-08-07 NOTE — Telephone Encounter (Signed)
Sent patient msg via MyChart to confirm, JS

## 2022-08-08 ENCOUNTER — Other Ambulatory Visit: Payer: Self-pay | Admitting: Dermatology

## 2022-08-08 NOTE — Telephone Encounter (Signed)
Please send in 90 day supply valacyclovir 500 mg twice daily. Thank you!

## 2022-08-12 ENCOUNTER — Other Ambulatory Visit: Payer: Self-pay

## 2022-08-12 MED ORDER — VALACYCLOVIR HCL 500 MG PO TABS: 500.0000 mg | ORAL_TABLET | Freq: Two times a day (BID) | ORAL | 0 refills | Status: DC

## 2022-08-13 ENCOUNTER — Ambulatory Visit: Payer: BC Managed Care – PPO | Admitting: Dermatology

## 2022-09-04 ENCOUNTER — Ambulatory Visit: Payer: BC Managed Care – PPO | Admitting: Dermatology

## 2022-09-07 ENCOUNTER — Other Ambulatory Visit: Payer: Self-pay | Admitting: Dermatology

## 2022-11-04 ENCOUNTER — Ambulatory Visit: Payer: Self-pay | Admitting: Dermatology

## 2022-11-08 ENCOUNTER — Ambulatory Visit: Admit: 2022-11-08 | Payer: BC Managed Care – PPO | Admitting: Orthopedic Surgery

## 2022-11-08 SURGERY — OPEN REDUCTION INTERNAL FIXATION (ORIF) ULNAR FRACTURE
Anesthesia: Choice | Laterality: Right

## 2022-11-16 ENCOUNTER — Other Ambulatory Visit: Payer: Self-pay | Admitting: Dermatology

## 2022-11-16 DIAGNOSIS — L650 Telogen effluvium: Secondary | ICD-10-CM

## 2022-11-20 MED ORDER — MINOXIDIL 2.5 MG PO TABS
1.2500 mg | ORAL_TABLET | Freq: Every day | ORAL | 0 refills | Status: DC
Start: 2022-11-20 — End: 2023-04-30

## 2022-11-20 NOTE — Addendum Note (Signed)
Addended by: Elie Goody on: 11/20/2022 02:30 PM   Modules accepted: Orders

## 2022-12-12 ENCOUNTER — Encounter: Payer: BC Managed Care – PPO | Admitting: Dermatology

## 2022-12-31 ENCOUNTER — Encounter: Payer: Self-pay | Admitting: Dermatology

## 2022-12-31 ENCOUNTER — Ambulatory Visit (INDEPENDENT_AMBULATORY_CARE_PROVIDER_SITE_OTHER): Payer: BC Managed Care – PPO | Admitting: Dermatology

## 2022-12-31 VITALS — BP 113/70 | HR 93

## 2022-12-31 DIAGNOSIS — W908XXA Exposure to other nonionizing radiation, initial encounter: Secondary | ICD-10-CM

## 2022-12-31 DIAGNOSIS — R238 Other skin changes: Secondary | ICD-10-CM

## 2022-12-31 DIAGNOSIS — D492 Neoplasm of unspecified behavior of bone, soft tissue, and skin: Secondary | ICD-10-CM

## 2022-12-31 DIAGNOSIS — Z1283 Encounter for screening for malignant neoplasm of skin: Secondary | ICD-10-CM | POA: Diagnosis not present

## 2022-12-31 DIAGNOSIS — R2241 Localized swelling, mass and lump, right lower limb: Secondary | ICD-10-CM

## 2022-12-31 DIAGNOSIS — Z86018 Personal history of other benign neoplasm: Secondary | ICD-10-CM

## 2022-12-31 DIAGNOSIS — L578 Other skin changes due to chronic exposure to nonionizing radiation: Secondary | ICD-10-CM | POA: Diagnosis not present

## 2022-12-31 DIAGNOSIS — R229 Localized swelling, mass and lump, unspecified: Secondary | ICD-10-CM

## 2022-12-31 DIAGNOSIS — D1801 Hemangioma of skin and subcutaneous tissue: Secondary | ICD-10-CM

## 2022-12-31 DIAGNOSIS — R2242 Localized swelling, mass and lump, left lower limb: Secondary | ICD-10-CM

## 2022-12-31 DIAGNOSIS — I781 Nevus, non-neoplastic: Secondary | ICD-10-CM

## 2022-12-31 DIAGNOSIS — L738 Other specified follicular disorders: Secondary | ICD-10-CM

## 2022-12-31 DIAGNOSIS — D229 Melanocytic nevi, unspecified: Secondary | ICD-10-CM

## 2022-12-31 DIAGNOSIS — L821 Other seborrheic keratosis: Secondary | ICD-10-CM

## 2022-12-31 DIAGNOSIS — L720 Epidermal cyst: Secondary | ICD-10-CM

## 2022-12-31 DIAGNOSIS — L814 Other melanin hyperpigmentation: Secondary | ICD-10-CM

## 2022-12-31 NOTE — Progress Notes (Signed)
   Follow-Up Visit   Subjective  Lisa Dennis is a 57 y.o. female who presents for the following: Skin Cancer Screening and Full Body Skin Exam. Hx of dysplastic nevus. New spots on legs.   The patient presents for Total-Body Skin Exam (TBSE) for skin cancer screening and mole check. The patient has spots, moles and lesions to be evaluated, some may be new or changing and the patient may have concern these could be cancer.    The following portions of the chart were reviewed this encounter and updated as appropriate: medications, allergies, medical history  Review of Systems:  No other skin or systemic complaints except as noted in HPI or Assessment and Plan.  Objective  Well appearing patient in no apparent distress; mood and affect are within normal limits.  A full examination was performed including scalp, head, eyes, ears, nose, lips, neck, chest, axillae, abdomen, back, buttocks, bilateral upper extremities, bilateral lower extremities, hands, feet, fingers, toes, fingernails, and toenails. All findings within normal limits unless otherwise noted below.   Relevant physical exam findings are noted in the Assessment and Plan.  Exam of nails limited by presence of nail polish.   Assessment & Plan   HISTORY OF DYSPLASTIC NEVUS. Upper mid abdomen - moderate to severe, excised 07/11/2020  No evidence of recurrence today Recommend regular full body skin exams Recommend daily broad spectrum sunscreen SPF 30+ to sun-exposed areas, reapply every 2 hours as needed.  Call if any new or changing lesions are noted between office visits   SKIN CANCER SCREENING PERFORMED TODAY.  ACTINIC DAMAGE - Chronic condition, secondary to cumulative UV/sun exposure - diffuse scaly erythematous macules with underlying dyspigmentation - Recommend daily broad spectrum sunscreen SPF 30+ to sun-exposed areas, reapply every 2 hours as needed.  - Staying in the shade or wearing long sleeves, sun  glasses (UVA+UVB protection) and wide brim hats (4-inch brim around the entire circumference of the hat) are also recommended for sun protection.  - Call for new or changing lesions.  LENTIGINES, SEBORRHEIC KERATOSES, HEMANGIOMAS - Benign normal skin lesions - Benign-appearing - Call for any changes  MELANOCYTIC NEVI - Tan-brown and/or pink-flesh-colored symmetric macules and papules - Benign appearing on exam today - Observation - Call clinic for new or changing moles - Recommend daily use of broad spectrum spf 30+ sunscreen to sun-exposed areas.  - Check nails when remove polish.   Neoplasm of uncertain behavior/Skin Nodule  Exam: 3 reducible subcutaneous nodules, 1 at right anterior leg and 2 at left anterior leg.   Treatment: Discussed observation vs ultrasound. Patient prefers ultrasound. May be varicose vein  MONITORING - 2 mm pink papule with fine telangiectasias on right nasal tip - benign-appearing. No features of skin cancer - R alar crease, white firm papule suggestive of milium - observe. Patient will return if it bleeds or grows  Sebaceous Hyperplasia - Small yellow papules with a central dell at face. - Benign-appearing - Observe. Call for changes.   Neoplasm of soft tissue  Related Procedures [place] LT LOWER EXTREM LTD SOFT TISSUE NON VASCULAR  Skin nodule  Related Procedures [place] LT LOWER EXTREM LTD SOFT TISSUE NON VASCULAR   Return in about 1 year (around 12/31/2023) for TBSE, HxDN.  I, Lawson Radar, CMA, am acting as scribe for Elie Goody, MD.   Documentation: I have reviewed the above documentation for accuracy and completeness, and I agree with the above.  Elie Goody, MD

## 2022-12-31 NOTE — Patient Instructions (Addendum)
[facility] at [facility] [address] [place],  [place]  [address] Main: [phone]   Recommend daily broad spectrum sunscreen SPF 30+ to sun-exposed areas, reapply every 2 hours as needed. Call for new or changing lesions.  Staying in the shade or wearing long sleeves, sun glasses (UVA+UVB protection) and wide brim hats (4-inch brim around the entire circumference of the hat) are also recommended for sun protection.    Melanoma ABCDEs  Melanoma is the most dangerous type of skin cancer, and is the leading cause of death from skin disease.  You are more likely to develop melanoma if you: Have light-colored skin, light-colored eyes, or red or blond hair Spend a lot of time in the sun Tan regularly, either outdoors or in a tanning bed Have had blistering sunburns, especially during childhood Have a close family member who has had a melanoma Have atypical moles or large birthmarks  Early detection of melanoma is key since treatment is typically straightforward and cure rates are extremely high if we catch it early.   The first sign of melanoma is often a change in a mole or a new dark spot.  The ABCDE system is a way of remembering the signs of melanoma.  A for asymmetry:  The two halves do not match. B for border:  The edges of the growth are irregular. C for color:  A mixture of colors are present instead of an even brown color. D for diameter:  Melanomas are usually (but not always) greater than 6mm - the size of a pencil eraser. E for evolution:  The spot keeps changing in size, shape, and color.  Please check your skin once per month between visits. You can use a small mirror in front and a large mirror behind you to keep an eye on the back side or your body.   If you see any new or changing lesions before your next follow-up, please call to schedule a visit.  Please continue daily skin protection including broad spectrum sunscreen  SPF 30+ to sun-exposed areas, reapplying every 2 hours as needed when you're outdoors.   Staying in the shade or wearing long sleeves, sun glasses (UVA+UVB protection) and wide brim hats (4-inch brim around the entire circumference of the hat) are also recommended for sun protection.    Due to recent changes in healthcare laws, you may see results of your pathology and/or laboratory studies on MyChart before the doctors have had a chance to review them. We understand that in some cases there may be results that are confusing or concerning to you. Please understand that not all results are received at the same time and often the doctors may need to interpret multiple results in order to provide you with the best plan of care or course of treatment. Therefore, we ask that you please give [place] 2 business days to thoroughly review all your results before contacting the office for clarification. Should we see a critical lab result, you will be contacted sooner.   If You Need Anything After Your Visit  If you have any questions or concerns for your doctor, please call our main line at [phone] and press option 4 to reach your doctor's medical assistant. If no one answers, please leave a voicemail as directed and we will return your call as soon as possible. Messages left after 4 pm will be answered the following business day.   You may also send [place] a message via  MyChart. We typically respond to MyChart messages within 1-2 business days.  For prescription refills, please ask your pharmacy to contact our office. Our fax number is [phone].  If you have an urgent issue when the clinic is closed that cannot wait until the next business day, you can page your doctor at the number below.    Please note that while we do our best to be available for urgent issues outside of office hours, we are not available 24/7.   If you have an urgent issue and are unable to reach [place], you may choose to seek medical  care at your doctor's office, retail clinic, urgent care center, or emergency room.  If you have a medical emergency, please immediately call 911 or go to the emergency department.  Pager Numbers  - Dr. Gwen Pounds: [phone]  - Dr. Roseanne Reno: [phone]  - Dr. Katrinka Blazing: [phone]   In the event of inclement weather, please call our main line at [phone] for an update on the status of any delays or closures.  Dermatology Medication Tips: Please keep the boxes that topical medications come in in order to help keep track of the instructions about where and how to use these. Pharmacies typically print the medication instructions only on the boxes and not directly on the medication tubes.   If your medication is too expensive, please contact our office at [phone] option 4 or send [place] a message through MyChart.   We are unable to tell what your co-pay for medications will be in advance as this is different depending on your insurance coverage. However, we may be able to find a substitute medication at lower cost or fill out paperwork to get insurance to cover a needed medication.   If a prior authorization is required to get your medication covered by your insurance company, please allow [place] 1-2 business days to complete this process.  Drug prices often vary depending on where the prescription is filled and some pharmacies may offer cheaper prices.  The website www.goodrx.com contains coupons for medications through different pharmacies. The prices here do not account for what the cost may be with help from insurance (it may be cheaper with your insurance), but the website can give you the price if you did not use any insurance.  - You can print the associated coupon and take it with your prescription to the pharmacy.  - You may also stop by our office during regular business hours and pick up a GoodRx coupon card.  - If you need your prescription sent electronically to a different  pharmacy, notify our office through [facility] or by phone at [phone] option 4.     Si Usted Necesita Algo Despus de Su Visita  Tambin puede enviarnos un mensaje a travs de Clinical cytogeneticist. Por lo general respondemos a los mensajes de MyChart en el transcurso de 1 a 2 das hbiles.  Para renovar recetas, por favor pida a su farmacia que se ponga en contacto con nuestra oficina. Annie Sable de fax es [place] [phone].  Si tiene un asunto urgente cuando la clnica est cerrada y que no puede esperar hasta el siguiente da hbil, puede llamar/localizar a su doctor(a) al nmero que aparece a continuacin.   Por favor, tenga en cuenta que aunque hacemos todo lo posible para estar disponibles para asuntos urgentes fuera del horario de [place], no estamos disponibles las 24 horas del da, los 7 [address]  [address] de la [place].   Si tiene un problema urgente y  no puede comunicarse con nosotros, puede optar por buscar atencin mdica  en el consultorio de su doctor(a), en una clnica privada, en un centro de atencin urgente o en una sala de emergencias.  Si tiene Engineer, drilling, por favor llame inmediatamente al 911 o vaya a la sala de emergencias.  Nmeros de bper  - Dr. Gwen Pounds: [phone]  - Dra. Roseanne Reno: [phone]  - Dr. Katrinka Blazing: [phone]   En caso de inclemencias del tiempo, por favor llame a Lacy Duverney principal al [phone] para una actualizacin sobre el [place] de cualquier retraso o cierre.  Consejos para la medicacin en dermatologa: Por favor, guarde las cajas en las que vienen los medicamentos de uso tpico para ayudarle a seguir las instrucciones sobre dnde y cmo usarlos. Las farmacias generalmente imprimen las instrucciones del medicamento slo en las cajas y no directamente en los tubos del [place].   Si su medicamento es muy caro, por favor, pngase en contacto con Rolm Gala llamando al [phone] y presione la opcin 4 o envenos un mensaje a  travs de Clinical cytogeneticist.   No podemos decirle cul ser su copago por los medicamentos por adelantado ya que esto es diferente dependiendo de la cobertura de su seguro. Sin embargo, es posible que podamos encontrar un medicamento sustituto a Audiological scientist un formulario para que el seguro cubra el medicamento que se considera necesario.   Si se requiere una autorizacin previa para que su compaa de seguros [place] su medicamento, por favor permtanos de 1 a 2 das hbiles para completar [address]  Los precios de los medicamentos varan con frecuencia dependiendo del Environmental consultant de dnde se surte la receta y alguna farmacias pueden ofrecer precios ms baratos.  El sitio web www.goodrx.com tiene cupones para medicamentos de Health and safety inspector. Los precios aqu no tienen en cuenta lo que podra costar con la ayuda del seguro (puede ser ms barato con su seguro), pero el sitio web puede darle el precio si no utiliz Tourist information centre manager.  - Puede imprimir el cupn correspondiente y llevarlo con su receta a la farmacia.  - Tambin puede pasar por nuestra oficina durante el horario de atencin regular y Education officer, museum una tarjeta de cupones de GoodRx.  - Si necesita que su receta se enve electrnicamente a una farmacia diferente, informe a nuestra oficina a travs de MyChart de [place] o por telfono llamando al [phone] y presione la opcin 4.

## 2023-01-16 ENCOUNTER — Telehealth: Payer: Self-pay

## 2023-01-16 NOTE — Telephone Encounter (Signed)
Called patient back in reference to her calling about the Ultrasound orders that were placed at her last visit 12/31/22.  Advised pt orders have been placed and she can call [facility] at [phone] and schedule her appointment./sh

## 2023-01-23 ENCOUNTER — Other Ambulatory Visit: Payer: Self-pay | Admitting: Dermatology

## 2023-01-23 ENCOUNTER — Other Ambulatory Visit: Payer: BC Managed Care – PPO

## 2023-01-23 DIAGNOSIS — R229 Localized swelling, mass and lump, unspecified: Secondary | ICD-10-CM

## 2023-01-23 DIAGNOSIS — D492 Neoplasm of unspecified behavior of bone, soft tissue, and skin: Secondary | ICD-10-CM

## 2023-01-24 ENCOUNTER — Ambulatory Visit
Admission: RE | Admit: 2023-01-24 | Discharge: 2023-01-24 | Disposition: A | Discharge status: Home / Self Care | Payer: BC Managed Care – PPO | Source: Ambulatory Visit | Attending: Dermatology | Admitting: Dermatology

## 2023-01-24 DIAGNOSIS — D492 Neoplasm of unspecified behavior of bone, soft tissue, and skin: Secondary | ICD-10-CM | POA: Insufficient documentation

## 2023-01-24 DIAGNOSIS — R229 Localized swelling, mass and lump, unspecified: Secondary | ICD-10-CM | POA: Insufficient documentation

## 2023-02-17 ENCOUNTER — Telehealth: Payer: Self-pay

## 2023-02-17 NOTE — Telephone Encounter (Signed)
-----   Message from [place] sent at 02/16/2023  6:13 PM EST ----- Diagnosis: Superficial hypoechoic lesion in the region of interest, likely a lipoma but considered indeterminate. Follow-up CT or tissue sampling is suggested for further characterization.   Plan: please call to share that ultrasound suggested raised spots on legs could be lipomas (benign overgrowth of fat), but could not fully determine what they are. They appear too deep for [place] to easily biopsy in dermatology. If patient would like further workup, recommend referral to general surgery

## 2023-02-17 NOTE — Telephone Encounter (Signed)
Patient advised that ultrasound suggested raised spots on legs could be lipomas (benign overgrowth of fat), but could not fully determine what they are and that they appear too deep for [place] to easily biopsy in dermatology. Patient would like referral for general surgery.  Anyone in particular? Butch Penny., RMA

## 2023-02-18 ENCOUNTER — Other Ambulatory Visit: Payer: Self-pay

## 2023-02-18 DIAGNOSIS — R229 Localized swelling, mass and lump, unspecified: Secondary | ICD-10-CM

## 2023-02-24 ENCOUNTER — Ambulatory Visit: Payer: BC Managed Care – PPO | Admitting: Dermatology

## 2023-03-03 ENCOUNTER — Ambulatory Visit: Payer: Self-pay | Admitting: Surgery

## 2023-03-05 ENCOUNTER — Ambulatory Visit: Payer: BC Managed Care – PPO | Admitting: Surgery

## 2023-04-07 ENCOUNTER — Encounter: Payer: Self-pay | Admitting: Surgery

## 2023-04-07 ENCOUNTER — Ambulatory Visit (INDEPENDENT_AMBULATORY_CARE_PROVIDER_SITE_OTHER): Payer: BC Managed Care – PPO | Admitting: Surgery

## 2023-04-07 VITALS — BP 108/76 | HR 99 | Temp 98.0°F | Ht 62.0 in | Wt 126.0 lb

## 2023-04-07 DIAGNOSIS — D1720 Benign lipomatous neoplasm of skin and subcutaneous tissue of unspecified limb: Secondary | ICD-10-CM

## 2023-04-07 DIAGNOSIS — D1723 Benign lipomatous neoplasm of skin and subcutaneous tissue of right leg: Secondary | ICD-10-CM

## 2023-04-07 DIAGNOSIS — D1724 Benign lipomatous neoplasm of skin and subcutaneous tissue of left leg: Secondary | ICD-10-CM | POA: Diagnosis not present

## 2023-04-07 NOTE — Patient Instructions (Signed)
 We will schedule you for an in office procedure to remove these areas on your legs. You may drive after your procedure but may have someone with you for this. You will need to hold your Aspirin 7 days prior to your procedure.    Lipoma  A lipoma is a noncancerous (benign) tumor that is made up of fat cells. This is a very common type of soft-tissue growth. Lipomas are usually found under the skin (subcutaneous). They may occur in any tissue of the body that contains fat. Common areas for lipomas to appear include the back, arms, shoulders, buttocks, and thighs. Lipomas grow slowly, and they are usually painless. Most lipomas do not cause problems and do not require treatment. What are the causes? The cause of this condition is not known. What increases the risk? You are more likely to develop this condition if: You are 57-57 years old. You have a family history of lipomas. What are the signs or symptoms? A lipoma usually appears as a small, round bump under the skin. In most cases, the lump will: Feel soft or rubbery. Not cause pain or other symptoms. However, if a lipoma is located in an area where it pushes on nerves, it can become painful or cause other symptoms. How is this diagnosed? A lipoma can usually be diagnosed with a physical exam. You may also have tests to confirm the diagnosis and to rule out other conditions. Tests may include: Imaging tests, such as a CT scan or an MRI. Removal of a tissue sample to be looked at under a microscope (biopsy). How is this treated? Treatment for this condition depends on the size of the lipoma and whether it is causing any symptoms. For small lipomas that are not causing problems, no treatment is needed. If a lipoma is bigger or it causes problems, surgery may be done to remove the lipoma. Lipomas can also be removed to improve appearance. Most often, the procedure is done after applying a medicine that numbs the area (local  anesthetic). Liposuction may be done to reduce the size of the lipoma before it is removed through surgery, or it may be done to remove the lipoma. Lipomas are removed with this method to limit incision size and scarring. A liposuction tube is inserted through a small incision into the lipoma, and the contents of the lipoma are removed through the tube with suction. Follow these instructions at home: Watch your lipoma for any changes. Keep all follow-up visits. This is important. Where to find more information OrthoInfo: orthoinfo.aaos.org Contact a health care provider if: Your lipoma becomes larger or hard. Your lipoma becomes painful, red, or increasingly swollen. These could be signs of infection or a more serious condition. Get help right away if: You develop tingling or numbness in an area near the lipoma. This could indicate that the lipoma is causing nerve damage. Summary A lipoma is a noncancerous tumor that is made up of fat cells. Most lipomas do not cause problems and do not require treatment. If a lipoma is bigger or it causes problems, surgery may be done to remove the lipoma. Contact a health care provider if your lipoma becomes larger or hard, or if it becomes painful, red, or increasingly swollen. These could be signs of infection or a more serious condition. This information is not intended to replace advice given to you by your health care provider. Make sure you discuss any questions you have with your health care provider. Document Revised: 03/30/2021 Document  Reviewed: 03/30/2021 Elsevier Patient Education  2024 [employer].

## 2023-04-09 NOTE — Progress Notes (Signed)
 Patient ID: Lisa Dennis, female   DOB: 09/14/1965, 58 y.o.   MRN: [id number]  HPI Lisa Dennis is a 58 y.o. female in consultation at the request of Dr. Claudene ports that she has been having this subcutaneous nodules for several months and has slowly increasing in size.  SHe reports no pain no fevers no chills no B type symptoms.  Does have a significant history of liver transplantation for alcoholic cirrhosis and HCC at [facility] 08/2020 prior portosystemic shunt. Intraoperative course notable for splenic capsule injury due to retraction of adherent distended colon requiring massive transfusion protocol (10u RBC, 7u FFP, 4u pltl, 3u cryo, 4L crystalloid + 1.25L colloid) she also developed a biliary stricture s/p ERCP and dilation  developing pancreatitis. U/S pers reviewed showing sub w nodules c/w lipomas HPI  Past Medical History:  Diagnosis Date   Alcohol abuse    Anxiety    Cirrhosis (HCC)    Dysplastic nevus 06/07/2020   upper mid abdomen - moderate to severe, excised 07/11/2020    Past Surgical History:  Procedure Laterality Date   COLONOSCOPY WITH PROPOFOL  N/A 04/06/2019   Procedure: COLONOSCOPY WITH PROPOFOL ;  Surgeon: Unk Corinn Skiff, MD;  Location: [facility] ENDOSCOPY;  Service: Gastroenterology;  Laterality: N/A;   ESOPHAGOGASTRODUODENOSCOPY (EGD) WITH PROPOFOL  N/A 06/08/2018   Procedure: ESOPHAGOGASTRODUODENOSCOPY (EGD) WITH PROPOFOL ;  Surgeon: Jinny Carmine, MD;  Location: [facility] ENDOSCOPY;  Service: Endoscopy;  Laterality: N/A;   ESOPHAGOGASTRODUODENOSCOPY (EGD) WITH PROPOFOL  N/A 11/12/2018   Procedure: ESOPHAGOGASTRODUODENOSCOPY (EGD) WITH PROPOFOL ;  Surgeon: Therisa Bi, MD;  Location: [facility] ENDOSCOPY;  Service: Gastroenterology;  Laterality: N/A;    No family history on file.  Social History Social History   Tobacco Use   Smoking status: Former    Passive exposure: Past   Smokeless tobacco: Never  Vaping Use   Vaping status: Never Used  Substance Use Topics    Alcohol use: Yes    Alcohol/week: 10.0 standard drinks of alcohol    Types: 10 Glasses of wine per week    Comment: x 2 drinks today   Drug use: Never    Allergies  Allergen Reactions   Benadryl [Diphenhydramine] Other (See Comments)    Altered Mental Status   Venlafaxine Hcl Er Itching   Penicillins Hives and Other (See Comments)    Did it involve swelling of the face/tongue/throat, SOB, or low BP? No Did it involve sudden or severe rash/hives, skin peeling, or any reaction on the inside of your mouth or nose? Unknown Did you need to seek medical attention at a hospital or doctor's office? Unknown When did it last happen?  unknown If all above answers are NO, may proceed with cephalosporin use.  **patient tolerates ceftriaxone  - Aug 2020    Clindamycin /Lincomycin Swelling   Latex Hives   Morphine And Codeine Hives   Sulfa Antibiotics Hives   Tetracyclines & Related Hives    Current Outpatient Medications  Medication Sig Dispense Refill   aspirin 81 MG EC tablet Take by mouth.     azaTHIOprine (IMURAN) 50 MG tablet Take 100 mg by mouth daily.     ENVARSUS XR 0.75 MG TB24 tablet Take 1.5 mg by mouth daily before breakfast.     ENVARSUS XR 1 MG TB24 Take 1 mg by mouth daily before breakfast.     folic acid  (FOLVITE ) 1 MG tablet Take 1 tablet (1 mg total) by mouth daily. 90 tablet 0   gabapentin (NEURONTIN) 100 MG capsule Take by mouth.  magnesium  oxide (MAG-OX) 400 (240 Mg) MG tablet Take 3 tablets by mouth 2 (two) times daily.     pantoprazole  (PROTONIX ) 40 MG tablet Take 40 mg by mouth daily.     propranolol (INDERAL) 20 MG tablet Take by mouth.     valACYclovir  (VALTREX ) 1000 MG tablet Take 2 by mouth at onset of symptoms and 2 by mouth 12 hours later. 20 tablet 1   valGANciclovir (VALCYTE) 450 MG tablet Take by mouth.     minoxidil  (LONITEN ) 2.5 MG tablet Take 0.5 tablets (1.25 mg total) by mouth daily. 15 tablet 0   No current facility-administered medications for  this visit.     Review of Systems Full ROS  was asked and was negative except for the information on the HPI  Physical Exam Blood pressure 108/76, pulse 99, temperature 98 F (36.7 C), height 5' 2 (1.575 m), weight 126 lb (57.2 kg), SpO2 95%. CONSTITUTIONAL: NAD alert. EYES: Pupils are equal, round, Sclera are non-icteric. EARS, NOSE, MOUTH AND THROAT: The oropharynx is clear. The oral mucosa is pink and moist. Hearing is intact to voice. LYMPH NODES:  Lymph nodes in the neck are normal. RESPIRATORY:  Lungs are clear. There is normal respiratory effort, with equal breath sounds bilaterally, and without pathologic use of accessory muscles. CARDIOVASCULAR: Heart is regular without murmurs, gallops, or rubs. GI: The abdomen is  soft, nontender, and nondistended. There are no palpable masses. There is no hepatosplenomegaly.  Chevron scar healing well w/o hernias There are normal bowel sounds  GU: Rectal deferred.   MUSCULOSKELETAL: Normal muscle strength and tone. No cyanosis or edema.   SKIN: there is evidence of multiple soft tissues masses on both lower extremities. 3 mobile subcutaneous nodules, 1 right anterior leg and 2 at left anterior and lateral leg  NEUROLOGIC: Motor and sensation is grossly normal. Cranial nerves are grossly intact. PSYCH:  Oriented to person, place and time. Affect is normal.  Data Reviewed  I have personally reviewed the patient's imaging, laboratory findings and medical records.    Assessment/Plan 58 year old female in chronic immunosuppression for liver transplant now presents with an enlarging lesion soft tissue masses on bilateral lower extremities.  Discussed with the patient about her disease process.  Given the growth I do think is reasonable to excise them and confirm that this is in fact a benign process.  She is certainly a risk of developing malignancies as she is on chronic immunosuppression.  I do think that we can perform the excisions under local  anesthetic here in the office.  Procedure discussed with her in detail.  Risk, benefits and possible complications including but not limited to: Bleeding, infection chronic pain and poor wound healing.  Please note that I spent 45 minutes in this encounter including extensive review of medical records, placing orders counseling the patient and performing documentation Copy of this report was sent to the referring provider     Laneta Luna, MD FACS General Surgeon 04/09/2023, 2:17 PM

## 2023-04-21 ENCOUNTER — Encounter: Payer: Self-pay | Admitting: Surgery

## 2023-04-21 ENCOUNTER — Ambulatory Visit: Payer: BC Managed Care – PPO | Admitting: Surgery

## 2023-04-21 VITALS — BP 110/74 | HR 97 | Temp 98.1°F | Ht 62.0 in | Wt 125.2 lb

## 2023-04-21 DIAGNOSIS — D1724 Benign lipomatous neoplasm of skin and subcutaneous tissue of left leg: Secondary | ICD-10-CM | POA: Diagnosis not present

## 2023-04-21 DIAGNOSIS — D1723 Benign lipomatous neoplasm of skin and subcutaneous tissue of right leg: Secondary | ICD-10-CM

## 2023-04-21 DIAGNOSIS — D1720 Benign lipomatous neoplasm of skin and subcutaneous tissue of unspecified limb: Secondary | ICD-10-CM

## 2023-04-21 NOTE — Patient Instructions (Signed)
Today we have removed a Lipoma in our office. Please see information below regarding this type of tumor.  You are free to shower 04/22/2023. Do not scrub at the area.   You have glue on your skin and sutures under the skin. The glue will come off on it's own in 10-14 days. You may shower normally until this occurs but do not submerge.  Please use Tylenol or Ibuprofen for pain as needed. You may use ice to the area 3-4 times today and tomorrow for any achiness.   We will see you back in 7-10 days to ensure that this has healed and to review the final pathology. Please see your appointment below. You may continue your regular activities right away but if you are having pain while doing something, stop what you are doing and try this activity once again in 3 days. Please call our office with any questions or concerns prior to your appointment.  Call to report any excessive bleeding, spreading redness, or increased pain.    Lipoma Removal Lipoma removal is a surgical procedure to remove a noncancerous (benign) tumor that is made up of fat cells (lipoma). Most lipomas are small and painless and do not require treatment. They can form in many areas of the body but are most common under the skin of the back, shoulders, arms, and thighs. You may need lipoma removal if you have a lipoma that is large, growing, or causing discomfort. Lipoma removal may also be done for cosmetic reasons. Tell a health care provider about: Any allergies you have. All medicines you are taking, including vitamins, herbs, eye drops, creams, and over-the-counter medicines. Any problems you or family members have had with anesthetic medicines. Any blood disorders you have. Any surgeries you have had. Any medical conditions you have. Whether you are pregnant or may be pregnant. What are the risks? Generally, this is a safe procedure. However, problems may occur, including: Infection. Bleeding. Allergic reactions to  medicines. Damage to nerves or blood vessels near the lipoma. Scarring.  Medicines Ask your health care provider about: Changing or stopping your regular medicines. This is especially important if you are taking diabetes medicines or blood thinners. Taking medicines such as aspirin and ibuprofen. These medicines can thin your blood. Do not take these medicines before your procedure if your health care provider instructs you not to. You may be given antibiotic medicine to help prevent infection. General instructions Ask your health care provider how your surgical site will be marked or identified. You will have a physical exam. Your health care provider will check the size of the lipoma and whether it can be moved easily.  What happens during the procedure? To reduce your risk of infection: Your health care team will wash or sanitize their hands. Your skin will be washed with surgical soap. You will be given the following: A medicine to numb the area (local anesthetic). An incision will be made over the lipoma or very near the lipoma. The incision may be made in a natural skin line or crease. Tissues, nerves, and blood vessels near the lipoma will be moved out of the way. The lipoma and the capsule that surrounds it will be separated from the surrounding tissues. The lipoma will be removed. The incision may be closed with stitches and surgical glue

## 2023-04-23 LAB — SURGICAL PATHOLOGY

## 2023-04-25 ENCOUNTER — Encounter: Payer: Self-pay | Admitting: Surgery

## 2023-04-25 NOTE — Progress Notes (Signed)
DIAGNOSIS Symptomatic soft tissue mass S/p Liver Transplant  PROCEDURES 1.  Excision of lipoma Right leg Deep subfascial 1 cm 2.  Excision of lipoma Left leg Deep subfascial 1 cm   ANESTHESIA: Lidocaine 1% with epinephrine  EBL: Minimal  FINDINGS: Lipoma  After informed consent was obtained the patient was prepped and draped in the usual sterile fashion.  Lidocaine 1% was injected over the area of interest.      I started on the Right leg,15 blade knife used to create an incision and the subcutaneous tissue was dissected free with hemostats. The mass was dissected free from adjacent structures and it was found to be below the fascia,  using Metzenbaum scissors.  It was sent for permanent pathology.  Hemostasis obtained with pressure.  Fascia was closed with 2-0 vicryl.  The wound was closed in a 2 layer fashion with dermal layer using interrupted 3-0 Vicryl.  The skin was closed in a subcuticular fashion using 4-0 Monocryl.  Dermabond was applied.   Please note that her tissue are very thin and friable, and the skin opened twice because the dermal stitches did not hol, I used a 3-0 nylon interrumpted stitches.  Attention was turned to the left leg. The mass was dissected free from adjacent structures and it was found to be below the fascia,  using Metzenbaum scissors.  It was sent for permanent pathology.  Hemostasis obtained with pressure.  Fascia was closed with 2-0 vicryl. The wound was closed in a 2 layer fashion with dermal layer using interrupted 3-0 Vicryl.  The skin was closed in a subcuticular fashion using 4-0 Monocryl.  Dermabond was applied.   No complications. The  patient tolerated procedure well

## 2023-04-30 ENCOUNTER — Other Ambulatory Visit: Payer: Self-pay | Admitting: Dermatology

## 2023-04-30 ENCOUNTER — Ambulatory Visit (INDEPENDENT_AMBULATORY_CARE_PROVIDER_SITE_OTHER): Payer: BC Managed Care – PPO | Admitting: Surgery

## 2023-04-30 ENCOUNTER — Encounter: Payer: Self-pay | Admitting: Surgery

## 2023-04-30 VITALS — BP 111/74 | HR 94 | Temp 98.3°F | Ht 62.0 in | Wt 123.0 lb

## 2023-04-30 DIAGNOSIS — L650 Telogen effluvium: Secondary | ICD-10-CM

## 2023-04-30 DIAGNOSIS — D1724 Benign lipomatous neoplasm of skin and subcutaneous tissue of left leg: Secondary | ICD-10-CM

## 2023-04-30 DIAGNOSIS — Z09 Encounter for follow-up examination after completed treatment for conditions other than malignant neoplasm: Secondary | ICD-10-CM

## 2023-04-30 DIAGNOSIS — D1723 Benign lipomatous neoplasm of skin and subcutaneous tissue of right leg: Secondary | ICD-10-CM

## 2023-04-30 NOTE — Patient Instructions (Signed)
 Lipoma Removal, Care After The following information offers guidance on how to care for yourself after your procedure. Your health care provider may also give you more specific instructions. If you have problems or questions, contact your health care provider. What can I expect after the procedure? After the procedure, it is common to have: Mild pain. Swelling. Bruising. Follow these instructions at home: Bathing  Do not take baths, swim, or use a hot tub until your health care provider approves. Ask your health care provider if you may take showers. You may only be allowed to take sponge baths. Keep your bandage (dressing) clean and dry until your health care provider says it can be removed. Incision care  Follow instructions from your health care provider about how to take care of your incision. Make sure you: Wash your hands with soap and water for at least 20 seconds before and after you change your dressing. If soap and water are not available, use hand sanitizer. Change your dressing as told by your health care provider. Leave stitches (sutures), skin glue, or adhesive strips in place. These skin closures may need to stay in place for 2 weeks or longer. If adhesive strip edges start to loosen and curl up, you may trim the loose edges. Do not remove adhesive strips completely unless your health care provider tells you to do that. Check your incision area every day for signs of infection. Check for: More redness, swelling, or pain. Fluid or blood. Warmth. Pus or a bad smell. Medicines Take over-the-counter and prescription medicines only as told by your health care provider. If you were prescribed an antibiotic medicine, use it as told by your health care provider. Do not stop using the antibiotic even if you start to feel better. General instructions  If you were given a sedative during the procedure, it can affect you for several hours. Do not drive or operate machinery until your  health care provider says that it is safe. Do not use any products that contain nicotine or tobacco before the procedure. These products include cigarettes, chewing tobacco, and vaping devices, such as e-cigarettes. These can delay healing after surgery. If you need help quitting, ask your health care provider. Return to your normal activities as told by your health care provider. Ask your health care provider what activities are safe for you. Keep all follow-up visits. This is important. Contact a health care provider if: You have more redness, swelling, or pain around your incision. You have fluid or blood coming from your incision. Your incision feels warm to the touch. You have pus or a bad smell coming from your incision. You have pain that does not get better with medicine. Get help right away if: You have chills or a fever. You have severe pain. Summary After the procedure, it is common to have mild pain, swelling, and bruising. Follow instructions from your health care provider about how to take care of your incision. Contact a health care provider if you have signs of infection such as more redness, swelling, or pain. This information is not intended to replace advice given to you by your health care provider. Make sure you discuss any questions you have with your health care provider. Document Revised: 03/30/2021 Document Reviewed: 03/30/2021 Elsevier Patient Education  2024 [employer].

## 2023-05-03 NOTE — Progress Notes (Signed)
 Lisa Dennis is doing okay after excision of lipomas from both legs.  Some associated pain.  No fevers no chills pathology discussed with her in detail. Physical exam she is in no acute distress.  Wounds are healing well.  On the right leg I removed 2 stitches.  And placed Steri-Strips.  A/P doing well overall w/o complications RTC prn

## 2023-05-26 ENCOUNTER — Ambulatory Visit: Payer: BC Managed Care – PPO | Admitting: Dermatology

## 2023-06-27 ENCOUNTER — Other Ambulatory Visit: Payer: Self-pay | Admitting: Dermatology

## 2023-06-27 DIAGNOSIS — L650 Telogen effluvium: Secondary | ICD-10-CM

## 2023-09-01 ENCOUNTER — Ambulatory Visit: Payer: BC Managed Care – PPO | Admitting: Dermatology

## 2023-10-21 ENCOUNTER — Other Ambulatory Visit: Payer: Self-pay | Admitting: Dermatology

## 2023-10-21 DIAGNOSIS — L650 Telogen effluvium: Secondary | ICD-10-CM

## 2023-12-01 ENCOUNTER — Ambulatory Visit: Payer: BC Managed Care – PPO | Admitting: Dermatology

## 2023-12-08 ENCOUNTER — Other Ambulatory Visit: Payer: Self-pay | Admitting: Dermatology

## 2023-12-08 DIAGNOSIS — L650 Telogen effluvium: Secondary | ICD-10-CM

## 2023-12-10 ENCOUNTER — Telehealth: Payer: Self-pay

## 2023-12-10 DIAGNOSIS — L650 Telogen effluvium: Secondary | ICD-10-CM

## 2023-12-10 NOTE — Telephone Encounter (Signed)
 Patient called for a refill of Minoxidil . She is scheduled for her yearly follow up with you next month. Okay to send in?

## 2023-12-11 MED ORDER — MINOXIDIL 2.5 MG PO TABS
1.2500 mg | ORAL_TABLET | Freq: Every day | ORAL | 0 refills | Status: DC
Start: 2023-12-11 — End: 2024-01-19

## 2023-12-11 NOTE — Telephone Encounter (Signed)
 Refill sent in. aw

## 2024-01-01 ENCOUNTER — Encounter: Payer: Self-pay | Admitting: Dermatology

## 2024-01-01 ENCOUNTER — Ambulatory Visit (INDEPENDENT_AMBULATORY_CARE_PROVIDER_SITE_OTHER): Payer: BC Managed Care – PPO | Admitting: Dermatology

## 2024-01-01 DIAGNOSIS — D492 Neoplasm of unspecified behavior of bone, soft tissue, and skin: Secondary | ICD-10-CM | POA: Diagnosis not present

## 2024-01-01 DIAGNOSIS — L820 Inflamed seborrheic keratosis: Secondary | ICD-10-CM

## 2024-01-01 DIAGNOSIS — Z1283 Encounter for screening for malignant neoplasm of skin: Secondary | ICD-10-CM

## 2024-01-01 DIAGNOSIS — Z86018 Personal history of other benign neoplasm: Secondary | ICD-10-CM

## 2024-01-01 DIAGNOSIS — L578 Other skin changes due to chronic exposure to nonionizing radiation: Secondary | ICD-10-CM

## 2024-01-01 DIAGNOSIS — L814 Other melanin hyperpigmentation: Secondary | ICD-10-CM

## 2024-01-01 DIAGNOSIS — B1089 Other human herpesvirus infection: Secondary | ICD-10-CM

## 2024-01-01 DIAGNOSIS — D225 Melanocytic nevi of trunk: Secondary | ICD-10-CM | POA: Diagnosis not present

## 2024-01-01 DIAGNOSIS — D229 Melanocytic nevi, unspecified: Secondary | ICD-10-CM

## 2024-01-01 DIAGNOSIS — D1801 Hemangioma of skin and subcutaneous tissue: Secondary | ICD-10-CM

## 2024-01-01 DIAGNOSIS — L905 Scar conditions and fibrosis of skin: Secondary | ICD-10-CM

## 2024-01-01 DIAGNOSIS — W908XXA Exposure to other nonionizing radiation, initial encounter: Secondary | ICD-10-CM

## 2024-01-01 DIAGNOSIS — L821 Other seborrheic keratosis: Secondary | ICD-10-CM

## 2024-01-01 NOTE — Progress Notes (Signed)
 Follow-Up Visit   Subjective  Lisa Dennis is a 58 y.o. female who presents for the following: Skin Cancer Screening and Full Body Skin Exam hx of Dysplastic Nevus, check spots R calf x 2, L calf x 1 ~63yr, itchy, one on R calf hx of bleeding when scratched, check spot R shoulder, irritating  The patient presents for Total-Body Skin Exam (TBSE) for skin cancer screening and mole check. The patient has spots, moles and lesions to be evaluated, some may be new or changing and the patient may have concern these could be cancer.    The following portions of the chart were reviewed this encounter and updated as appropriate: medications, allergies, medical history  Review of Systems:  No other skin or systemic complaints except as noted in HPI or Assessment and Plan.  Objective  Well appearing patient in no apparent distress; mood and affect are within normal limits.  A full examination was performed including scalp, head, eyes, ears, nose, lips, neck, chest, axillae, abdomen, back, buttocks, bilateral upper extremities, bilateral lower extremities, hands, feet, fingers, toes, fingernails, and toenails. All findings within normal limits unless otherwise noted below.   Relevant physical exam findings are noted in the Assessment and Plan.  Right Lower Leg x 2, L lower leg x  2, R ant shoulder x 1 (5) Stuck on waxy paps with erythema RUQA 8.31mm dark brown macule   Assessment & Plan   SKIN CANCER SCREENING PERFORMED TODAY.  ACTINIC DAMAGE - Chronic condition, secondary to cumulative UV/sun exposure - diffuse scaly erythematous macules with underlying dyspigmentation - Recommend daily broad spectrum sunscreen SPF 30+ to sun-exposed areas, reapply every 2 hours as needed.  - Staying in the shade or wearing long sleeves, sun glasses (UVA+UVB protection) and wide brim hats (4-inch brim around the entire circumference of the hat) are also recommended for sun protection.  - Call for  new or changing lesions.  LENTIGINES, SEBORRHEIC KERATOSES, HEMANGIOMAS - Benign normal skin lesions - Benign-appearing - Call for any changes  MELANOCYTIC NEVI - Tan-brown and/or pink-flesh-colored symmetric macules and papules - Benign appearing on exam today - Observation - Call clinic for new or changing moles - Recommend daily use of broad spectrum spf 30+ sunscreen to sun-exposed areas.   HISTORY OF DYSPLASTIC NEVUS No evidence of recurrence today Recommend regular full body skin exams Recommend daily broad spectrum sunscreen SPF 30+ to sun-exposed areas, reapply every 2 hours as needed.  Call if any new or changing lesions are noted between office visits  - upper mid abdomen  SCARS Secondary to Lipoma excisions 04/21/23 R and L lower legs Exam: Dyspigmented smooth macule or patch. Benign-appearing.  Observation.  Call clinic for new or changing lesions. Recommend daily broad spectrum sunscreen SPF 30+, reapply every 2 hours as needed. Treatment: Recommend Serica moisturizing scar formula cream every night or [facility] brand or Mederma silicone scar sheet every night for the first year after a scar appears to help with scar remodeling if desired. Scars remodel on their own for a full year and will gradually improve in appearance over time. Lesions identified in ultrasound were removed by surgeon. Patient reports pathology was benign. Surgeon told patient that more will develop  MONITORING - 2 mm pink papule with fine telangiectasias on right nasal tip-stable - benign-appearing. No features of skin cancer - observe. Patient will return if it bleeds or grows  HERPESVIRAL INFECTION  L post thigh Exam red patches with clusted erosion  Chronic condition  with duration or expected duration over one year. flaring   Herpes Simplex Virus = Cold Sores = Fever Blisters is a chronic recurring blistering; scabbing sore-producing viral infection that is recurrent usually in the same  area triggered by stress, sun/UV exposure and trauma.  It is infectious and can be spread from person to person by direct contact.  It is not curable, but is treatable with topical and oral medication.  Treatment Plan Cont Valacyclovir  1 gram, 2 po at onset of outbreak, then 2 po 12 hours later  INFLAMED SEBORRHEIC KERATOSIS (5) Right Lower Leg x 2, L lower leg x  2, R ant shoulder x 1 (5) Symptomatic, irritating, patient would like treated. Destruction of lesion - Right Lower Leg x 2, L lower leg x  2, R ant shoulder x 1 (5) Complexity: simple   Destruction method: cryotherapy   Informed consent: discussed and consent obtained   Timeout:  patient name, date of birth, surgical site, and procedure verified Lesion destroyed using liquid nitrogen: Yes   Region frozen until ice ball extended beyond lesion: Yes   Cryo cycles: 1 or 2. Outcome: patient tolerated procedure well with no complications   Post-procedure details: wound care instructions given    NEOPLASM OF SKIN RUQA Skin / nail biopsy Type of biopsy: tangential   Informed consent: discussed and consent obtained   Timeout: patient name, date of birth, surgical site, and procedure verified   Procedure prep:  Patient was prepped and draped in usual sterile fashion Prep type:  Isopropyl alcohol Anesthesia: the lesion was anesthetized in a standard fashion   Anesthetic:  1% lidocaine  w/ epinephrine  1-100,000 buffered w/ 8.4% NaHCO3 Instrument used: DermaBlade   Hemostasis achieved with: pressure and aluminum chloride   Outcome: patient tolerated procedure well   Post-procedure details: sterile dressing applied and wound care instructions given   Dressing type: bandage (mupirocin )    Specimen 1 - Surgical pathology Differential Diagnosis: Dysplastic Nevus r/o Melanoma  Check Margins: No 8.24mm dark brown macule MULTIPLE BENIGN NEVI   LENTIGINES   ACTINIC ELASTOSIS   SEBORRHEIC KERATOSES   CHERRY ANGIOMA   Return  in about 1 year (around 12/31/2024) for TBSE, Hx of Dysplastic nevi.  I, Grayce Saunas, RMA, am acting as scribe for Boneta Sharps, MD .   Documentation: I have reviewed the above documentation for accuracy and completeness, and I agree with the above.  Boneta Sharps, MD

## 2024-01-01 NOTE — Patient Instructions (Addendum)
 Wound Care Instructions  Cleanse wound gently with soap and water once a day then pat dry with clean gauze. Apply a thin coat of Petrolatum (petroleum jelly, Vaseline) over the wound (unless you have an allergy to this). We recommend that you use a new, sterile tube of Vaseline. Do not pick or remove scabs. Do not remove the yellow or white healing tissue from the base of the wound.  Cover the wound with fresh, clean, nonstick gauze and secure with paper tape. You may use Band-Aids in place of gauze and tape if the wound is small enough, but would recommend trimming much of the tape off as there is often too much. Sometimes Band-Aids can irritate the skin.  You should call the office for your biopsy report after 1 week if you have not already been contacted.  If you experience any problems, such as abnormal amounts of bleeding, swelling, significant bruising, significant pain, or evidence of infection, please call the office immediately.  FOR ADULT SURGERY PATIENTS: If you need something for pain relief you may take 1 extra strength Tylenol  (acetaminophen ) AND 2 Ibuprofen  (200mg  each) together every 4 hours as needed for pain. (do not take these if you are allergic to them or if you have a reason you should not take them.) Typically, you may only need pain medication for 1 to 3 days.      Cryotherapy Aftercare  Wash gently with soap and water everyday.   Apply Vaseline and Band-Aid daily until healed.    Seborrheic Keratosis  What causes seborrheic keratoses? Seborrheic keratoses are harmless, common skin growths that first appear during adult life.  As time goes by, more growths appear.  Some people may develop a large number of them.  Seborrheic keratoses appear on both covered and uncovered body parts.  They are not caused by sunlight.  The tendency to develop seborrheic keratoses can be inherited.  They vary in color from skin-colored to gray, brown, or even black.  They can be either  smooth or have a rough, warty surface.   Seborrheic keratoses are superficial and look as if they were stuck on the skin.  Under the microscope this type of keratosis looks like layers upon layers of skin.  That is why at times the top layer may seem to fall off, but the rest of the growth remains and re-grows.    Treatment Seborrheic keratoses do not need to be treated, but can easily be removed in the office.  Seborrheic keratoses often cause symptoms when they rub on clothing or jewelry.  Lesions can be in the way of shaving.  If they become inflamed, they can cause itching, soreness, or burning.  Removal of a seborrheic keratosis can be accomplished by freezing, burning, or surgery. If any spot bleeds, scabs, or grows rapidly, please return to have it checked, as these can be an indication of a skin cancer.   Due to recent changes in healthcare laws, you may see results of your pathology and/or laboratory studies on MyChart before the doctors have had a chance to review them. We understand that in some cases there may be results that are confusing or concerning to you. Please understand that not all results are received at the same time and often the doctors may need to interpret multiple results in order to provide you with the best plan of care or course of treatment. Therefore, we ask that you please give us  2 business days to thoroughly review all your  results before contacting the office for clarification. Should we see a critical lab result, you will be contacted sooner.   If You Need Anything After Your Visit  If you have any questions or concerns for your doctor, please call our main line at [phone] and press option 4 to reach your doctor's medical assistant. If no one answers, please leave a voicemail as directed and we will return your call as soon as possible. Messages left after 4 pm will be answered the following business day.   You may also send us  a message via MyChart. We  typically respond to MyChart messages within 1-2 business days.  For prescription refills, please ask your pharmacy to contact our office. Our fax number is [phone].  If you have an urgent issue when the clinic is closed that cannot wait until the next business day, you can page your doctor at the number below.    Please note that while we do our best to be available for urgent issues outside of office hours, we are not available 24/7.   If you have an urgent issue and are unable to reach us , you may choose to seek medical care at your doctor's office, retail clinic, urgent care center, or emergency room.  If you have a medical emergency, please immediately call 911 or go to the emergency department.  Pager Numbers  - Dr. Hester: [phone]  - Dr. Jackquline: [phone]  - Dr. Claudene: [phone]   - Dr. Raymund: [phone]  In the event of inclement weather, please call our main line at [phone] for an update on the status of any delays or closures.  Dermatology Medication Tips: Please keep the boxes that topical medications come in in order to help keep track of the instructions about where and how to use these. Pharmacies typically print the medication instructions only on the boxes and not directly on the medication tubes.   If your medication is too expensive, please contact our office at [phone] option 4 or send us  a message through MyChart.   We are unable to tell what your co-pay for medications will be in advance as this is different depending on your insurance coverage. However, we may be able to find a substitute medication at lower cost or fill out paperwork to get insurance to cover a needed medication.   If a prior authorization is required to get your medication covered by your insurance company, please allow us  1-2 business days to complete this process.  Drug prices often vary depending on where the prescription is filled and some pharmacies may  offer cheaper prices.  The website www.goodrx.com contains coupons for medications through different pharmacies. The prices here do not account for what the cost may be with help from insurance (it may be cheaper with your insurance), but the website can give you the price if you did not use any insurance.  - You can print the associated coupon and take it with your prescription to the pharmacy.  - You may also stop by our office during regular business hours and pick up a GoodRx coupon card.  - If you need your prescription sent electronically to a different pharmacy, notify our office through [facility] or by phone at [phone] option 4.     Si Usted Necesita Algo Despus de Su Visita  Tambin puede enviarnos un mensaje a travs de Clinical cytogeneticist. Por lo general respondemos a los mensajes de MyChart en el transcurso de 1 a 2 das hbiles.  Para renovar recetas, por favor pida a su farmacia que se ponga en contacto con nuestra oficina. Randi lakes de fax es [place] [phone].  Si tiene un asunto urgente cuando la clnica est cerrada y que no puede esperar hasta el siguiente da hbil, puede llamar/localizar a su doctor(a) al nmero que aparece a continuacin.   Por favor, tenga en cuenta que aunque hacemos todo lo posible para estar disponibles para asuntos urgentes fuera del horario de [place], no estamos disponibles las 24 horas del da, los 7 [address]  [address] de la [place].   Si tiene un problema urgente y no puede comunicarse con nosotros, puede optar por buscar atencin mdica  en el consultorio de su doctor(a), en una clnica privada, en un centro de atencin urgente o en una sala de emergencias.  Si tiene Engineer, drilling, por favor llame inmediatamente al 911 o vaya a la sala de emergencias.  Nmeros de bper  - Dr. Hester: [phone]  - Dra. Jackquline: [phone]  - Dr. Claudene: [phone]  - Dra. Kitts: [phone]  En caso de inclemencias del [place], por favor llame  a nuestra lnea principal al [phone] para una actualizacin sobre el estado de cualquier retraso o cierre.  Consejos para la medicacin en dermatologa: Por favor, guarde las cajas en las que vienen los medicamentos de uso tpico para ayudarle a seguir las instrucciones sobre dnde y cmo usarlos. Las farmacias generalmente imprimen las instrucciones del medicamento slo en las cajas y no directamente en los tubos del [place].   Si su medicamento es muy caro, por favor, pngase en contacto con landry rieger llamando al [phone] y presione la opcin 4 o envenos un mensaje a travs de Clinical cytogeneticist.   No podemos decirle cul ser su copago por los medicamentos por adelantado ya que esto es diferente dependiendo de la cobertura de su seguro. Sin embargo, es posible que podamos encontrar un medicamento sustituto a Audiological scientist un formulario para que el seguro cubra el medicamento que se considera necesario.   Si se requiere una autorizacin previa para que su compaa de seguros malta su medicamento, por favor permtanos de 1 a 2 das hbiles para completar este proceso.  Los precios de los medicamentos varan con frecuencia dependiendo del Environmental consultant de dnde se surte la receta y alguna farmacias pueden ofrecer precios ms baratos.  El sitio web www.goodrx.com tiene cupones para medicamentos de Health and safety inspector. Los precios aqu no tienen en cuenta lo que podra costar con la ayuda del seguro (puede ser ms barato con su seguro), pero el sitio web puede darle el precio si no utiliz Tourist information centre manager.  - Puede imprimir el cupn correspondiente y llevarlo con su receta a la farmacia.  - Tambin puede pasar por nuestra oficina durante el horario de atencin regular y Education officer, museum una tarjeta de cupones de GoodRx.  - Si necesita que su receta se enve electrnicamente a una farmacia diferente, informe a nuestra oficina a travs de MyChart de  o por telfono llamando al [phone] y  presione la opcin 4.

## 2024-01-05 ENCOUNTER — Ambulatory Visit: Payer: Self-pay | Admitting: Dermatology

## 2024-01-05 LAB — SURGICAL PATHOLOGY

## 2024-01-05 NOTE — Telephone Encounter (Signed)
-----   Message from [facility] sent at 01/05/2024  5:00 PM EDT ----- Diagnosis: 1. Skin, RUQA :       DYSPLASTIC COMPOUND NEVUS WITH SEVERE ATYPIA, CLOSE TO MARGIN, SEE DESCRIPTION    Please call to share diagnosis and offer excision.   Explanation: Your biopsy shows an atypical mole. It looks unusual enough that it cannot be distinguished from an early melanoma skin cancer. The safest course of action is to ensure it is fully  removed with a surgery.  Treatment: you return for an hour long appointment where we perform a skin surgery. We numb the site of the skin cancer and a safety margin of normal skin around it. We remove the full thickness of  skin and close the wound with two layers of stitches. The sample is sent to the lab to check that the skin cancer was fully removed. Return one week later to have wound checked and surface stitches  removed. Surgical wound leaves a line scar. Risks include pain, infection, bleeding, thickened scar, wound dehiscence, recurrence  ----- Message ----- From: Interface, Lab In Three Zero One Sent: 01/05/2024   4:22 PM EDT To: Boneta Sharps, MD

## 2024-01-05 NOTE — Telephone Encounter (Signed)
 Discussed pathology results and treatment plan. Patient voiced understanding. Excision scheduled for 02/25/2024.

## 2024-01-09 ENCOUNTER — Other Ambulatory Visit: Payer: Self-pay | Admitting: Internal Medicine

## 2024-01-09 DIAGNOSIS — Z1231 Encounter for screening mammogram for malignant neoplasm of breast: Secondary | ICD-10-CM

## 2024-01-14 ENCOUNTER — Other Ambulatory Visit: Payer: Self-pay | Admitting: *Deleted

## 2024-01-14 ENCOUNTER — Inpatient Hospital Stay
Admission: RE | Admit: 2024-01-14 | Discharge: 2024-01-14 | Disposition: A | Discharge status: Home / Self Care | Payer: Self-pay | Source: Ambulatory Visit | Attending: Internal Medicine | Admitting: Internal Medicine

## 2024-01-14 ENCOUNTER — Inpatient Hospital Stay
Admission: RE | Admit: 2024-01-14 | Discharge: 2024-01-14 | Disposition: A | Payer: Self-pay | Source: Ambulatory Visit | Attending: Internal Medicine | Admitting: Internal Medicine

## 2024-01-14 DIAGNOSIS — Z1231 Encounter for screening mammogram for malignant neoplasm of breast: Secondary | ICD-10-CM

## 2024-01-15 ENCOUNTER — Other Ambulatory Visit: Payer: Self-pay | Admitting: Dermatology

## 2024-01-15 DIAGNOSIS — L650 Telogen effluvium: Secondary | ICD-10-CM

## 2024-01-19 ENCOUNTER — Other Ambulatory Visit: Payer: Self-pay | Admitting: Dermatology

## 2024-01-19 DIAGNOSIS — L650 Telogen effluvium: Secondary | ICD-10-CM

## 2024-01-29 ENCOUNTER — Other Ambulatory Visit: Payer: Self-pay

## 2024-01-29 ENCOUNTER — Telehealth: Payer: Self-pay

## 2024-01-29 DIAGNOSIS — L650 Telogen effluvium: Secondary | ICD-10-CM

## 2024-01-29 MED ORDER — MINOXIDIL 2.5 MG PO TABS: 2.5000 mg | ORAL_TABLET | Freq: Every day | ORAL | 2 refills | Status: DC

## 2024-01-29 NOTE — Telephone Encounter (Signed)
 Patient called requesting a refill of Minoxidil  tablets, 90 day supply to [facility] pharmacy  Ok Minoxidil  2.5 mg 90 with 2 refill sent to [facility] pharmacy

## 2024-01-29 NOTE — Progress Notes (Signed)
 Opened in error . disregard

## 2024-02-01 ENCOUNTER — Encounter: Payer: Self-pay | Admitting: Dermatology

## 2024-02-01 ENCOUNTER — Other Ambulatory Visit: Payer: Self-pay | Admitting: Dermatology

## 2024-02-01 DIAGNOSIS — L650 Telogen effluvium: Secondary | ICD-10-CM

## 2024-02-01 MED ORDER — MINOXIDIL 2.5 MG PO TABS: 1.2500 mg | ORAL_TABLET | Freq: Every day | ORAL | 1 refills | Status: AC

## 2024-02-01 MED ORDER — MINOXIDIL 2.5 MG PO TABS: 1.2500 mg | ORAL_TABLET | Freq: Every day | ORAL | 1 refills | Status: DC

## 2024-02-08 NOTE — Progress Notes (Unsigned)
 02/10/2024 5:54 PM   Lisa Dennis 21-Apr-1965 [id number]  Referring provider: Cleotilde Oneil FALCON, MD [phone] [facility] [address] [facility] [place],  [place] [address]  Urological history: 1.   No chief complaint on file.  HPI: Lisa Dennis is a 58 y.o. woman who presents today for rUTI's.  Previous records reviewed.  Patient states that she has had *** urinary tract infections over the last year.  Reviewing her records, I do not see any recent documented urine cultures in EPIC.   Her symptoms with a urinary tract infection consist of ***.  She has/has not been on any antibiotics for the last 30 days.  She has/has not had a catheter in the last 30 days.    She does/does not have a history of nephrolithiasis, pelvic surgery or pelvic trauma. ***  She does not have any history of GU system abnormalities.  ***  She is/is not sexually active.  She has/has not noted a correlation with her urinary tract infections and sexual intercourse.  ***   She does/does not engage in anal sex. ***  She is/ is not having anal to vaginal sex.*** She is/is not voiding before and after sex. ***     She is/is not postmenopausal. ***  She is/is not *** having vaginal discharge or vaginal irritation.   She is taking/apply *** estrogen cream ***  She admits to/denies constipation, fecal incontinence  and/or diarrhea. ***  She has/does not have incontinence.  She is using incontinence pads. ***  She is having/ not having pain with bladder filling.  ***  She has/has not traveled recently. ***   She has a history of working or walking for long periods of time.  ***  She has *** when she is not having an infection.   She has/not had any recent imaging studies.  ***  She is drinking *** of water daily.    Patients with rUTIs should have a complete history obtained, including LUTS such as dysuria, frequency, urgency, nocturia, incontinence, hematuria,  pneumaturia, and fecaluria. Further information to obtain includes any history of bowel symptoms such as diarrhea, accidental bowel leakage, or constipation; recent use of antibiotics for any medical condition; prior antibiotic-related problems (e.g., C. difficile infection); antibiotic allergies and sensitivities; back or flank pain; catheter usage; vaginal discharge or irritation; menopausal status; post-coital UTI; contraceptive method; and use of spermicides or estrogen- or progesterone-containing products. Details of prior urinary tract or pelvic surgery should be obtained, and patients should be queried as to travel history and history of working or walking for long periods of time. Baseline genitourinary symptoms between infections may also be illuminative, including the number of voids per day, sensation of urge to void, straining to void, a sensation of incomplete emptying, pelvic pressure or heaviness, vaginal bulge, dysuria, dyspareunia, as well as the location, character, and severity of any baseline genitourinary or pelvic pain or discomfort. UTI history includes frequency of UTI, antimicrobial usage, and documentation of positive cultures and the type of cultured microorganisms. Risk factors for complicated UTI, as previously discussed, should also be elucidated.   PMH: Past Medical History:  Diagnosis Date   Alcohol abuse    Anxiety    Cirrhosis (HCC)    Dysplastic nevus 06/07/2020   upper mid abdomen - moderate to severe, excised 07/11/2020   Dysplastic nevus 01/01/2024   RUQ abdomen. Severe atypia. Excision pending.    Surgical History: Past Surgical History:  Procedure Laterality Date  COLONOSCOPY WITH PROPOFOL  N/A 04/06/2019   Procedure: COLONOSCOPY WITH PROPOFOL ;  Surgeon: Unk Corinn Skiff, MD;  Location: [facility] ENDOSCOPY;  Service: Gastroenterology;  Laterality: N/A;   ESOPHAGOGASTRODUODENOSCOPY (EGD) WITH PROPOFOL  N/A 06/08/2018   Procedure: ESOPHAGOGASTRODUODENOSCOPY (EGD)  WITH PROPOFOL ;  Surgeon: Jinny Carmine, MD;  Location: [facility] ENDOSCOPY;  Service: Endoscopy;  Laterality: N/A;   ESOPHAGOGASTRODUODENOSCOPY (EGD) WITH PROPOFOL  N/A 11/12/2018   Procedure: ESOPHAGOGASTRODUODENOSCOPY (EGD) WITH PROPOFOL ;  Surgeon: Therisa Bi, MD;  Location: [facility] ENDOSCOPY;  Service: Gastroenterology;  Laterality: N/A;    Home Medications:  Allergies as of 02/10/2024       Reactions   Benadryl [diphenhydramine] Other (See Comments)   Altered Mental Status   Venlafaxine Hcl Er Itching   Penicillins Hives, Other (See Comments)   Did it involve swelling of the face/tongue/throat, SOB, or low BP? No Did it involve sudden or severe rash/hives, skin peeling, or any reaction on the inside of your mouth or nose? Unknown Did you need to seek medical attention at a hospital or doctor's office? Unknown When did it last happen?  unknown If all above answers are NO, may proceed with cephalosporin use.  **patient tolerates ceftriaxone  - Aug 2020   Clindamycin /lincomycin Swelling   Latex Hives   Morphine And Codeine Hives   Sulfa Antibiotics Hives   Tetracyclines & Related Hives        Medication List        Accurate as of February 08, 2024  5:54 PM. If you have any questions, ask your nurse or doctor.          aspirin EC 81 MG tablet Take by mouth.   azaTHIOprine 50 MG tablet Commonly known as: IMURAN Take 100 mg by mouth daily.   Envarsus XR 0.75 MG Tb24 tablet Generic drug: tacrolimus ER Take 1.5 mg by mouth daily before breakfast.   Envarsus XR 1 MG Tb24 Generic drug: tacrolimus ER Take 1 mg by mouth daily before breakfast.   folic acid  1 MG tablet Commonly known as: FOLVITE  Take 1 tablet (1 mg total) by mouth daily.   gabapentin 100 MG capsule Commonly known as: NEURONTIN Take by mouth.   magnesium  oxide 400 (240 Mg) MG tablet Commonly known as: MAG-OX Take 3 tablets by mouth 2 (two) times daily.   minoxidil  2.5 MG tablet Commonly known as:  LONITEN  Take 0.5 tablets (1.25 mg total) by mouth daily. Take 1 tablet (2.5 mg) daily   pantoprazole  40 MG tablet Commonly known as: PROTONIX  Take 40 mg by mouth daily.   propranolol 20 MG tablet Commonly known as: INDERAL Take by mouth.   valACYclovir  1000 MG tablet Commonly known as: VALTREX  Take 2 by mouth at onset of symptoms and 2 by mouth 12 hours later.   valGANciclovir 450 MG tablet Commonly known as: VALCYTE Take by mouth.        Allergies:  Allergies  Allergen Reactions   Benadryl [Diphenhydramine] Other (See Comments)    Altered Mental Status   Venlafaxine Hcl Er Itching   Penicillins Hives and Other (See Comments)    Did it involve swelling of the face/tongue/throat, SOB, or low BP? No Did it involve sudden or severe rash/hives, skin peeling, or any reaction on the inside of your mouth or nose? Unknown Did you need to seek medical attention at a hospital or doctor's office? Unknown When did it last happen?  unknown If all above answers are NO, may proceed with cephalosporin use.  **patient tolerates ceftriaxone  - Aug 2020  Clindamycin /Lincomycin Swelling   Latex Hives   Morphine And Codeine Hives   Sulfa Antibiotics Hives   Tetracyclines & Related Hives    Family History: No family history on file.  Social History:  reports that she has quit smoking. She has been exposed to tobacco smoke. She has never used smokeless tobacco. She reports current alcohol use of about 10.0 standard drinks of alcohol per week. She reports that she does not use drugs.  ROS: Pertinent ROS in HPI  Physical Exam: There were no vitals taken for this visit.  Constitutional:  Well nourished. Alert and oriented, No acute distress. HEENT: [place] AT, moist mucus membranes.  Trachea midline, no masses. Cardiovascular: No clubbing, cyanosis, or edema. Respiratory: Normal respiratory effort, no increased work of breathing. GU: No CVA tenderness.  No bladder fullness or masses.   Recession of labia minora, dry, pale vulvar vaginal mucosa and loss of mucosal ridges and folds.  Normal urethral meatus, no lesions, no prolapse, no discharge.   No urethral masses, tenderness and/or tenderness. No bladder fullness, tenderness or masses. *** vagina mucosa, *** estrogen effect, no discharge, no lesions, *** pelvic support, *** cystocele and *** rectocele noted.  No cervical motion tenderness.  Uterus is freely mobile and non-fixed.  No adnexal/parametria masses or tenderness noted.  Anus and perineum are without rashes or lesions.   ***  Neurologic: Grossly intact, no focal deficits, moving all 4 extremities. Psychiatric: Normal mood and affect.    Laboratory Data: See Epic and HPI   I have reviewed the labs.   Pertinent Imaging: N/A   Assessment & Plan:  ***  1. History of rUTI's  ***  No follow-ups on file.  These notes generated with voice recognition software. I apologize for typographical errors.  CLOTILDA HELON RIGGERS  [facility] [address]  [address] [place], [place] [address] [phone]

## 2024-02-10 ENCOUNTER — Encounter: Payer: Self-pay | Admitting: Urology

## 2024-02-10 ENCOUNTER — Ambulatory Visit (INDEPENDENT_AMBULATORY_CARE_PROVIDER_SITE_OTHER): Admitting: Urology

## 2024-02-10 VITALS — BP 107/74 | HR 89 | Ht 63.0 in | Wt 122.0 lb

## 2024-02-10 DIAGNOSIS — Z87440 Personal history of urinary (tract) infections: Secondary | ICD-10-CM

## 2024-02-10 LAB — URINALYSIS, COMPLETE
Bilirubin, UA: NEGATIVE
Glucose, UA: NEGATIVE
Ketones, UA: NEGATIVE
Nitrite, UA: NEGATIVE
Protein,UA: NEGATIVE
RBC, UA: NEGATIVE
Specific Gravity, UA: 1.025 (ref 1.005–1.030)
Urobilinogen, Ur: 0.2 mg/dL (ref 0.2–1.0)
pH, UA: 6 (ref 5.0–7.5)

## 2024-02-10 LAB — MICROSCOPIC EXAMINATION: Epithelial Cells (non renal): >10 /HPF — AB (ref 0–10)

## 2024-02-10 LAB — BLADDER SCAN AMB NON-IMAGING

## 2024-02-11 ENCOUNTER — Ambulatory Visit
Admission: RE | Admit: 2024-02-11 | Discharge: 2024-02-11 | Disposition: A | Discharge status: Home / Self Care | Source: Ambulatory Visit | Attending: Internal Medicine | Admitting: Internal Medicine

## 2024-02-11 DIAGNOSIS — Z1231 Encounter for screening mammogram for malignant neoplasm of breast: Secondary | ICD-10-CM | POA: Diagnosis present

## 2024-02-14 LAB — CULTURE, URINE COMPREHENSIVE

## 2024-02-15 ENCOUNTER — Ambulatory Visit: Payer: Self-pay | Admitting: Urology

## 2024-02-15 DIAGNOSIS — Z87440 Personal history of urinary (tract) infections: Secondary | ICD-10-CM

## 2024-02-15 MED ORDER — CEPHALEXIN 500 MG PO CAPS: 500.0000 mg | ORAL_CAPSULE | Freq: Two times a day (BID) | ORAL | 0 refills | Status: AC

## 2024-02-15 MED ORDER — CEPHALEXIN 250 MG PO CAPS: 250.0000 mg | ORAL_CAPSULE | Freq: Every day | ORAL | 2 refills | Status: AC

## 2024-02-25 ENCOUNTER — Ambulatory Visit: Admitting: Dermatology

## 2024-02-25 ENCOUNTER — Encounter: Payer: Self-pay | Admitting: Dermatology

## 2024-02-25 DIAGNOSIS — D225 Melanocytic nevi of trunk: Secondary | ICD-10-CM

## 2024-02-25 DIAGNOSIS — D239 Other benign neoplasm of skin, unspecified: Secondary | ICD-10-CM

## 2024-02-25 MED ORDER — MUPIROCIN 2 % EX OINT: TOPICAL_OINTMENT | CUTANEOUS | 0 refills | Status: AC

## 2024-02-25 NOTE — Progress Notes (Signed)
   Follow-Up Visit   Subjective  Lisa Dennis is a 58 y.o. female who presents for the following: Excision of DN at right UQA  The following portions of the chart were reviewed this encounter and updated as appropriate: medications, allergies, medical history  Review of Systems:  No other skin or systemic complaints except as noted in HPI or Assessment and Plan.  Objective  Well appearing patient in no apparent distress; mood and affect are within normal limits.  A focused examination was performed of the following areas: abdomen Relevant physical exam findings are noted in the Assessment and Plan.   right UQA Pink bx site  Assessment & Plan   DYSPLASTIC NEVUS right UQA Skin excision - right UQA  Excision method:  elliptical Lesion length (cm):  0.6 Margin per side (cm):  0.4 Total excision diameter (cm):  1.4 Informed consent: discussed and consent obtained   Timeout: patient name, date of birth, surgical site, and procedure verified   Procedure prep:  Patient was prepped and draped in usual sterile fashion Prep type:  Chlorhexidine  Anesthesia: the lesion was anesthetized in a standard fashion   Anesthetic:  1% lidocaine  w/ epinephrine  1-100,000 buffered w/ 8.4% NaHCO3 (15 cc) Instrument used: #15 blade   Hemostasis achieved with: suture, pressure and electrodesiccation   Outcome: patient tolerated procedure well with no complications    Skin repair - right UQA Complexity:  Intermediate Final length (cm):  4.7 Informed consent: discussed and consent obtained   Timeout: patient name, date of birth, surgical site, and procedure verified   Procedure prep:  Patient was prepped and draped in usual sterile fashion Prep type:  Chlorhexidine  Anesthesia: the lesion was anesthetized in a standard fashion   Anesthetic:  1% lidocaine  w/ epinephrine  1-100,000 buffered w/ 8.4% NaHCO3 Reason for type of repair: reduce tension to allow closure, reduce the risk of  dehiscence, infection, and necrosis, reduce subcutaneous dead space and avoid a hematoma, allow closure of the large defect and preserve normal anatomy   Undermining: edges could be approximated without difficulty   Subcutaneous layers (deep stitches):  Suture size:  3-0 Suture type: Vicryl (polyglactin 910)   Stitches:  Buried vertical mattress Fine/surface layer approximation (top stitches):  Suture size:  4-0 Suture type: Prolene (polypropylene)   Stitches comment:  Running locked Hemostasis achieved with: suture, pressure and electrodesiccation Outcome: patient tolerated procedure well with no complications   Post-procedure details: sterile dressing applied and wound care instructions given   Dressing type: petrolatum, bandage and pressure dressing    Specimen 1 - Surgical pathology Differential Diagnosis: BX proven DYSPLASTIC COMPOUND NEVUS WITH SEVERE ATYPIA, CLOSE TO MARGIN  Check Margins: yes [id number] Lateral tag   Return in about 8 days (around 03/04/2024) for Suture Removal.  LILLETTE Lonell Drones, RMA, am acting as scribe for Boneta Sharps, MD .   Documentation: I have reviewed the above documentation for accuracy and completeness, and I agree with the above.  Boneta Sharps, MD

## 2024-02-25 NOTE — Patient Instructions (Signed)

## 2024-03-01 ENCOUNTER — Ambulatory Visit: Payer: Self-pay | Admitting: Dermatology

## 2024-03-01 ENCOUNTER — Ambulatory Visit: Payer: BC Managed Care – PPO | Admitting: Dermatology

## 2024-03-01 LAB — SURGICAL PATHOLOGY

## 2024-03-01 NOTE — Telephone Encounter (Signed)
 Discussed pathology results. Patient states the excision site is bruised and tender but is not inflamed or infected. KSA for suture removal.

## 2024-03-04 ENCOUNTER — Ambulatory Visit: Admitting: Dermatology

## 2024-03-04 ENCOUNTER — Encounter: Payer: Self-pay | Admitting: Dermatology

## 2024-03-04 DIAGNOSIS — Z4802 Encounter for removal of sutures: Secondary | ICD-10-CM

## 2024-03-04 DIAGNOSIS — Z48817 Encounter for surgical aftercare following surgery on the skin and subcutaneous tissue: Secondary | ICD-10-CM

## 2024-03-04 DIAGNOSIS — Z5189 Encounter for other specified aftercare: Secondary | ICD-10-CM

## 2024-03-04 NOTE — Patient Instructions (Signed)

## 2024-03-04 NOTE — Progress Notes (Signed)
° °  Follow-Up Visit   Subjective  Lisa Dennis is a 58 y.o. female who presents for the following: 1 wk f/u Dysplastic Nevus excision RUQA, margins free bx proven, area has been tender   The following portions of the chart were reviewed this encounter and updated as appropriate: medications, allergies, medical history  Review of Systems:  No other skin or systemic complaints except as noted in HPI or Assessment and Plan.  Objective  Well appearing patient in no apparent distress; mood and affect are within normal limits.   A focused examination was performed of the following areas: abdomen  Relevant exam findings are noted in the Assessment and Plan.  RUQA      Assessment & Plan   SEVERE DYSPLASTIC NEVUS Margins free, bx proven RUQA Exam: healing excision site  Treatment Plan: Encounter for Removal of Sutures - Incision site at the RUQA is clean, dry and intact. Bruising is expected for site. Will clear. Contact us  if pain worsens - Wound cleansed, sutures removed, wound cleansed. - Discussed pathology results showing Dysplastic nevus margins free  - Continue Mupirocin  ointment qd for 1 more week - Scars remodel for a full year. - Once wound fully healed, patient can apply over-the-counter silicone scar cream each night to help with scar remodeling if desired. - Patient advised to call with any concerns or if they notice any new or changing lesions.    VISIT FOR WOUND CHECK   ENCOUNTER FOR REMOVAL OF SUTURES    Return for as scheduled for TBSE, Hx of Dysplastic nevi.  I, Grayce Saunas, RMA, am acting as scribe for Boneta Sharps, MD .   Documentation: I have reviewed the above documentation for accuracy and completeness, and I agree with the above.  Boneta Sharps, MD

## 2024-05-12 ENCOUNTER — Ambulatory Visit: Admitting: Urology

## 2025-01-03 ENCOUNTER — Ambulatory Visit: Admitting: Dermatology
